# Patient Record
Sex: Female | Born: 1935 | Race: White | Hispanic: No | Marital: Married | Smoking: Never smoker
Health system: Southern US, Community
[De-identification: ages and names within clinical notes are randomized; demographics above are authoritative.]

## PROBLEM LIST (undated history)

## (undated) DIAGNOSIS — Z9289 Personal history of other medical treatment: Secondary | ICD-10-CM

## (undated) DIAGNOSIS — K5792 Diverticulitis of intestine, part unspecified, without perforation or abscess without bleeding: Secondary | ICD-10-CM

## (undated) DIAGNOSIS — K219 Gastro-esophageal reflux disease without esophagitis: Secondary | ICD-10-CM

## (undated) DIAGNOSIS — F0280 Dementia in other diseases classified elsewhere without behavioral disturbance: Secondary | ICD-10-CM

## (undated) DIAGNOSIS — R0609 Other forms of dyspnea: Secondary | ICD-10-CM

## (undated) DIAGNOSIS — I219 Acute myocardial infarction, unspecified: Secondary | ICD-10-CM

## (undated) DIAGNOSIS — R002 Palpitations: Secondary | ICD-10-CM

## (undated) DIAGNOSIS — D0472 Carcinoma in situ of skin of left lower limb, including hip: Secondary | ICD-10-CM

## (undated) DIAGNOSIS — F0150 Vascular dementia without behavioral disturbance: Secondary | ICD-10-CM

## (undated) DIAGNOSIS — I10 Essential (primary) hypertension: Secondary | ICD-10-CM

## (undated) DIAGNOSIS — T7840XA Allergy, unspecified, initial encounter: Secondary | ICD-10-CM

## (undated) DIAGNOSIS — R0600 Dyspnea, unspecified: Secondary | ICD-10-CM

## (undated) DIAGNOSIS — D414 Neoplasm of uncertain behavior of bladder: Secondary | ICD-10-CM

## (undated) DIAGNOSIS — Z889 Allergy status to unspecified drugs, medicaments and biological substances status: Secondary | ICD-10-CM

## (undated) DIAGNOSIS — M1990 Unspecified osteoarthritis, unspecified site: Secondary | ICD-10-CM

## (undated) DIAGNOSIS — N200 Calculus of kidney: Secondary | ICD-10-CM

## (undated) DIAGNOSIS — Z780 Asymptomatic menopausal state: Secondary | ICD-10-CM

## (undated) DIAGNOSIS — Z9889 Other specified postprocedural states: Secondary | ICD-10-CM

## (undated) DIAGNOSIS — Z90710 Acquired absence of both cervix and uterus: Secondary | ICD-10-CM

## (undated) DIAGNOSIS — D649 Anemia, unspecified: Secondary | ICD-10-CM

## (undated) DIAGNOSIS — C4491 Basal cell carcinoma of skin, unspecified: Secondary | ICD-10-CM

## (undated) HISTORY — DX: Essential (primary) hypertension: I10

## (undated) HISTORY — DX: Other specified postprocedural states: Z98.890

## (undated) HISTORY — DX: Gastro-esophageal reflux disease without esophagitis: K21.9

## (undated) HISTORY — PX: CARPAL TUNNEL RELEASE: SHX101

## (undated) HISTORY — DX: Personal history of other medical treatment: Z92.89

## (undated) HISTORY — DX: Neoplasm of uncertain behavior of bladder: D41.4

## (undated) HISTORY — PX: CATARACT EXTRACTION W/ INTRAOCULAR LENS  IMPLANT, BILATERAL: SHX1307

## (undated) HISTORY — DX: Dyspnea, unspecified: R06.00

## (undated) HISTORY — DX: Allergy, unspecified, initial encounter: T78.40XA

## (undated) HISTORY — PX: TONSILLECTOMY: SUR1361

## (undated) HISTORY — DX: Other forms of dyspnea: R06.09

## (undated) HISTORY — PX: BASAL CELL CARCINOMA EXCISION: SHX1214

## (undated) HISTORY — DX: Allergy status to unspecified drugs, medicaments and biological substances: Z88.9

## (undated) HISTORY — PX: BACK SURGERY: SHX140

## (undated) HISTORY — DX: Palpitations: R00.2

## (undated) HISTORY — PX: EYE SURGERY: SHX253

## (undated) HISTORY — DX: Diverticulitis of intestine, part unspecified, without perforation or abscess without bleeding: K57.92

## (undated) HISTORY — DX: Asymptomatic menopausal state: Z78.0

## (undated) HISTORY — DX: Acquired absence of both cervix and uterus: Z90.710

## (undated) HISTORY — PX: JOINT REPLACEMENT: SHX530

## (undated) HISTORY — PX: ABDOMINAL HYSTERECTOMY: SHX81

---

## 1994-11-28 HISTORY — PX: BREAST EXCISIONAL BIOPSY: SUR124

## 2003-11-29 HISTORY — PX: POSTERIOR CERVICAL LAMINECTOMY: SHX2248

## 2005-01-13 ENCOUNTER — Ambulatory Visit: Payer: Self-pay

## 2005-06-08 ENCOUNTER — Ambulatory Visit: Payer: Self-pay | Admitting: Internal Medicine

## 2005-12-01 ENCOUNTER — Ambulatory Visit: Payer: Self-pay | Admitting: Urology

## 2006-06-12 ENCOUNTER — Ambulatory Visit: Payer: Self-pay | Admitting: Internal Medicine

## 2007-06-14 ENCOUNTER — Ambulatory Visit: Payer: Self-pay | Admitting: Internal Medicine

## 2008-06-16 ENCOUNTER — Ambulatory Visit: Payer: Self-pay | Admitting: Internal Medicine

## 2009-05-27 ENCOUNTER — Encounter: Payer: Self-pay | Admitting: Cardiology

## 2009-05-27 ENCOUNTER — Ambulatory Visit: Payer: Self-pay | Admitting: Cardiology

## 2009-05-27 DIAGNOSIS — R002 Palpitations: Secondary | ICD-10-CM | POA: Insufficient documentation

## 2009-05-27 DIAGNOSIS — R9431 Abnormal electrocardiogram [ECG] [EKG]: Secondary | ICD-10-CM

## 2009-05-27 DIAGNOSIS — I10 Essential (primary) hypertension: Secondary | ICD-10-CM | POA: Insufficient documentation

## 2009-06-03 ENCOUNTER — Ambulatory Visit: Payer: Self-pay | Admitting: Cardiology

## 2009-06-04 ENCOUNTER — Encounter: Payer: Self-pay | Admitting: Cardiology

## 2009-06-11 ENCOUNTER — Telehealth (INDEPENDENT_AMBULATORY_CARE_PROVIDER_SITE_OTHER): Payer: Self-pay | Admitting: *Deleted

## 2009-06-15 ENCOUNTER — Encounter: Payer: Self-pay | Admitting: Cardiovascular Disease

## 2009-06-15 ENCOUNTER — Ambulatory Visit: Payer: Self-pay

## 2009-06-15 ENCOUNTER — Ambulatory Visit: Payer: Self-pay | Admitting: Cardiology

## 2009-06-17 ENCOUNTER — Ambulatory Visit: Payer: Self-pay | Admitting: Cardiology

## 2009-06-17 DIAGNOSIS — R0602 Shortness of breath: Secondary | ICD-10-CM | POA: Insufficient documentation

## 2009-06-19 LAB — CONVERTED CEMR LAB
BUN: 14 mg/dL (ref 6–23)
Chloride: 104 meq/L (ref 96–112)
Glucose, Bld: 93 mg/dL (ref 70–99)
Potassium: 3.9 meq/L (ref 3.5–5.1)

## 2009-06-22 ENCOUNTER — Ambulatory Visit: Payer: Self-pay

## 2009-06-22 ENCOUNTER — Encounter: Payer: Self-pay | Admitting: Cardiology

## 2009-08-13 ENCOUNTER — Ambulatory Visit: Payer: Self-pay | Admitting: Internal Medicine

## 2010-01-13 ENCOUNTER — Emergency Department: Payer: Self-pay | Admitting: Emergency Medicine

## 2010-01-18 ENCOUNTER — Ambulatory Visit: Payer: Self-pay | Admitting: Urology

## 2010-09-29 ENCOUNTER — Ambulatory Visit: Payer: Self-pay | Admitting: Internal Medicine

## 2011-05-19 ENCOUNTER — Encounter: Payer: Self-pay | Admitting: Cardiovascular Disease

## 2011-10-19 ENCOUNTER — Ambulatory Visit: Payer: Self-pay | Admitting: Family Medicine

## 2011-11-27 ENCOUNTER — Emergency Department: Payer: Self-pay | Admitting: Emergency Medicine

## 2012-02-08 ENCOUNTER — Ambulatory Visit: Payer: Self-pay | Admitting: Family Medicine

## 2012-10-12 ENCOUNTER — Ambulatory Visit: Payer: Self-pay

## 2012-11-13 ENCOUNTER — Ambulatory Visit: Payer: Self-pay | Admitting: Family Medicine

## 2013-02-02 ENCOUNTER — Ambulatory Visit: Payer: Self-pay | Admitting: Orthopedic Surgery

## 2013-02-02 ENCOUNTER — Inpatient Hospital Stay: Payer: Self-pay | Admitting: Orthopedic Surgery

## 2013-02-02 DIAGNOSIS — Z01810 Encounter for preprocedural cardiovascular examination: Secondary | ICD-10-CM

## 2013-02-02 LAB — BASIC METABOLIC PANEL
BUN: 19 mg/dL — ABNORMAL HIGH (ref 7–18)
Co2: 29 mmol/L (ref 21–32)
EGFR (African American): 54 — ABNORMAL LOW
EGFR (Non-African Amer.): 47 — ABNORMAL LOW
Glucose: 121 mg/dL — ABNORMAL HIGH (ref 65–99)
Osmolality: 285 (ref 275–301)
Potassium: 3.4 mmol/L — ABNORMAL LOW (ref 3.5–5.1)
Sodium: 141 mmol/L (ref 136–145)

## 2013-02-02 LAB — APTT: Activated PTT: 31.7 secs (ref 23.6–35.9)

## 2013-02-02 LAB — PROTIME-INR: INR: 0.9

## 2013-02-02 LAB — CBC
HGB: 14.2 g/dL (ref 12.0–16.0)
MCV: 91 fL (ref 80–100)
Platelet: 216 10*3/uL (ref 150–440)
RBC: 4.69 10*6/uL (ref 3.80–5.20)
RDW: 14.3 % (ref 11.5–14.5)
WBC: 11.7 10*3/uL — ABNORMAL HIGH (ref 3.6–11.0)

## 2013-02-05 LAB — CBC WITH DIFFERENTIAL/PLATELET
Basophil #: 0 10*3/uL (ref 0.0–0.1)
Basophil %: 0.3 %
Eosinophil #: 0.2 10*3/uL (ref 0.0–0.7)
Eosinophil %: 2 %
HGB: 10.5 g/dL — ABNORMAL LOW (ref 12.0–16.0)
Lymphocyte %: 15.6 %
MCH: 30.3 pg (ref 26.0–34.0)
MCHC: 33.2 g/dL (ref 32.0–36.0)
MCV: 91 fL (ref 80–100)
Monocyte %: 10.9 %
Neutrophil #: 7.5 10*3/uL — ABNORMAL HIGH (ref 1.4–6.5)
Neutrophil %: 71.2 %
Platelet: 146 10*3/uL — ABNORMAL LOW (ref 150–440)
RBC: 3.47 10*6/uL — ABNORMAL LOW (ref 3.80–5.20)

## 2013-02-05 LAB — BASIC METABOLIC PANEL
Anion Gap: 3 — ABNORMAL LOW (ref 7–16)
Calcium, Total: 7.6 mg/dL — ABNORMAL LOW (ref 8.5–10.1)
Chloride: 105 mmol/L (ref 98–107)
Co2: 30 mmol/L (ref 21–32)
EGFR (Non-African Amer.): 58 — ABNORMAL LOW
Glucose: 94 mg/dL (ref 65–99)
Osmolality: 274 (ref 275–301)
Potassium: 3.5 mmol/L (ref 3.5–5.1)
Sodium: 138 mmol/L (ref 136–145)

## 2013-02-07 ENCOUNTER — Encounter: Payer: Self-pay | Admitting: Internal Medicine

## 2013-02-26 ENCOUNTER — Encounter: Payer: Self-pay | Admitting: Internal Medicine

## 2013-04-18 IMAGING — MG MM CAD SCREENING MAMMO
1 series · 4 of 4 positions shown · non-contrast
Comparison: none

REASON FOR EXAM: SCR MAMMO NO ORDER
COMMENTS:

[R CC · right · 4 of 4 slices shown]
[im 1/4]
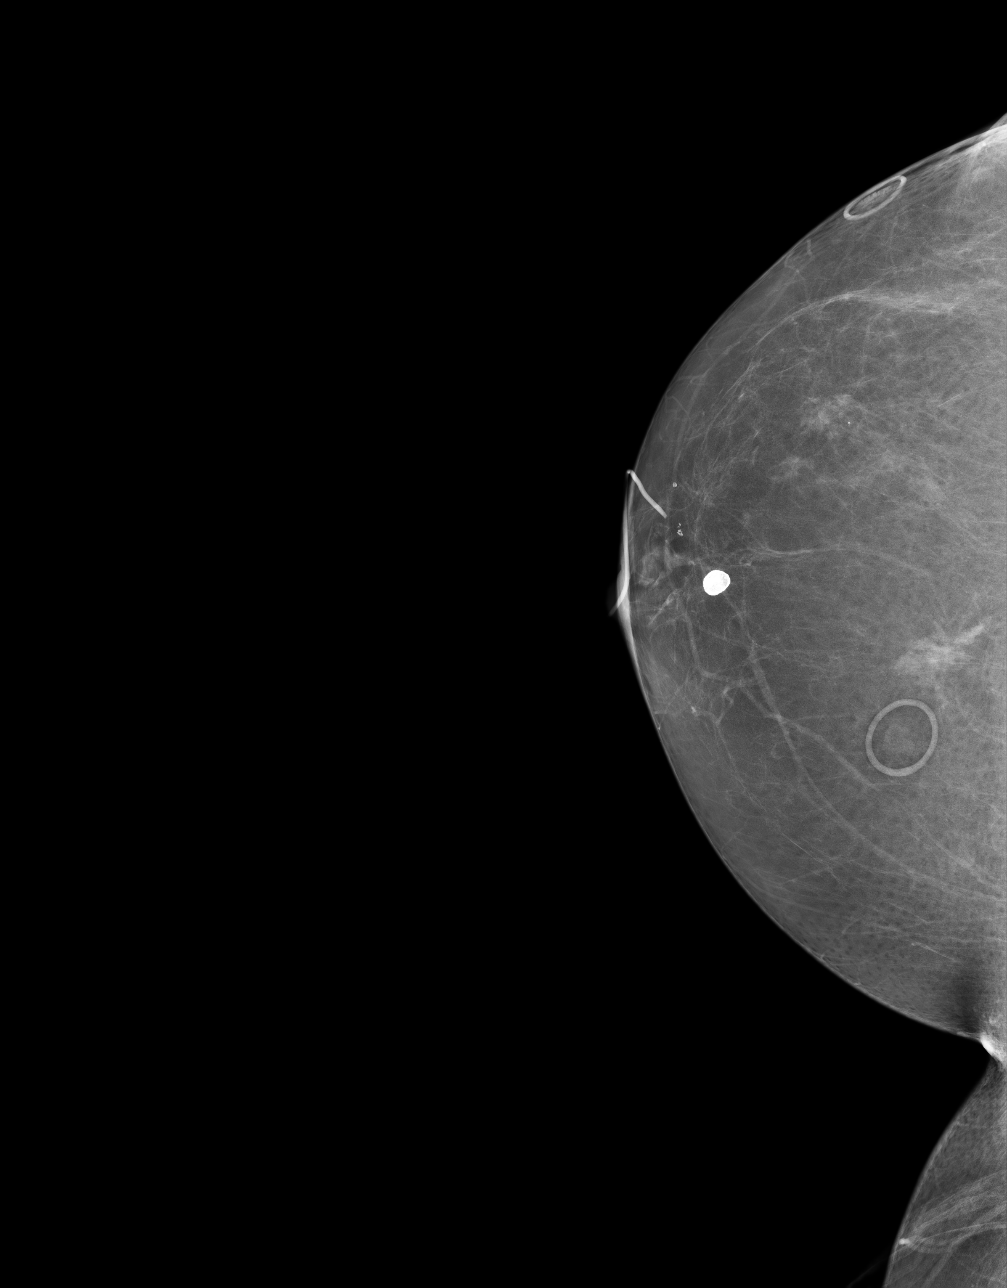
[im 2/4]
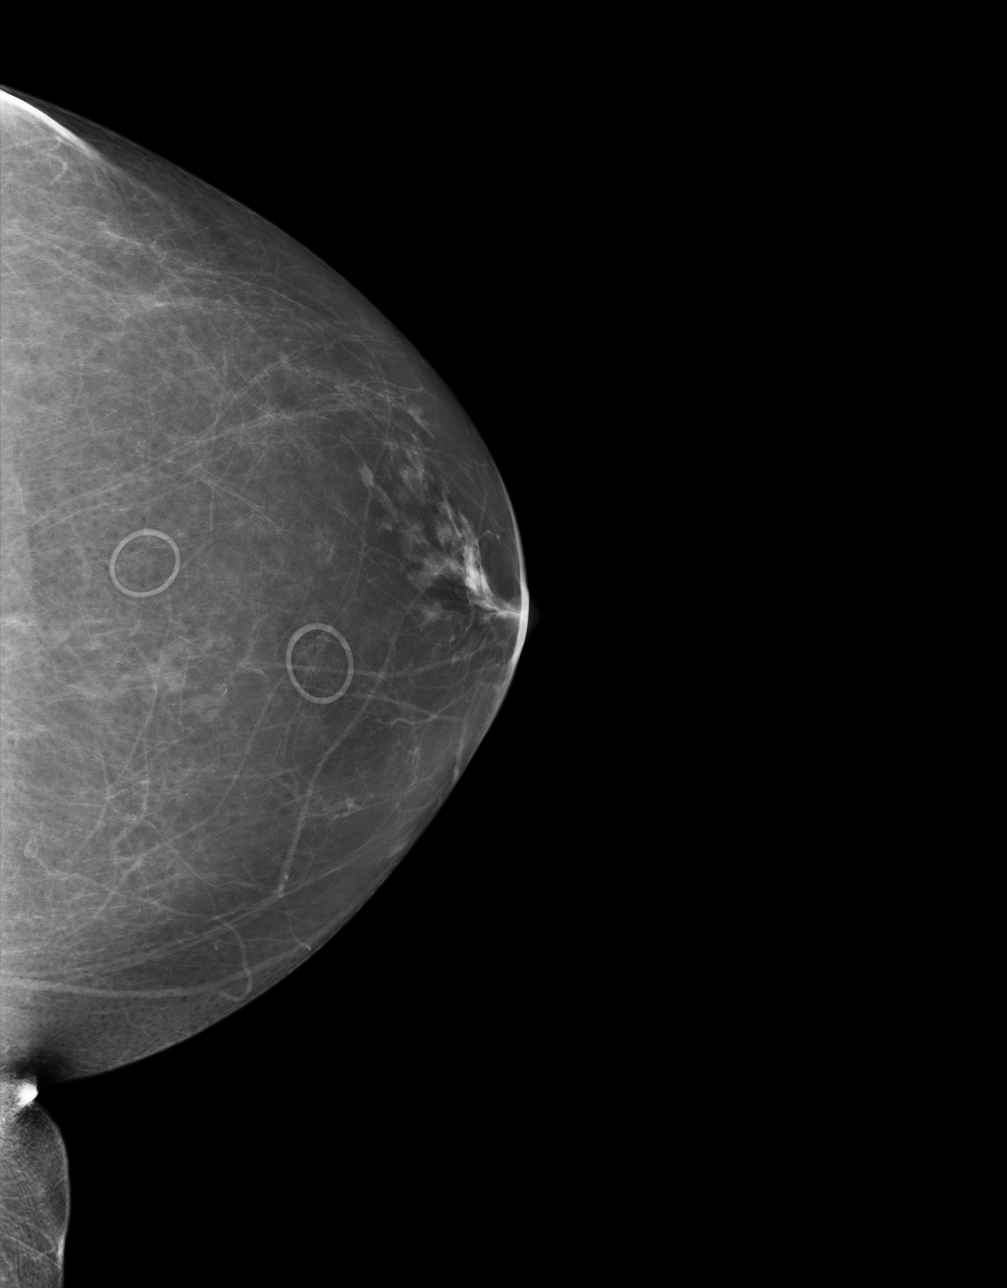
[im 3/4]
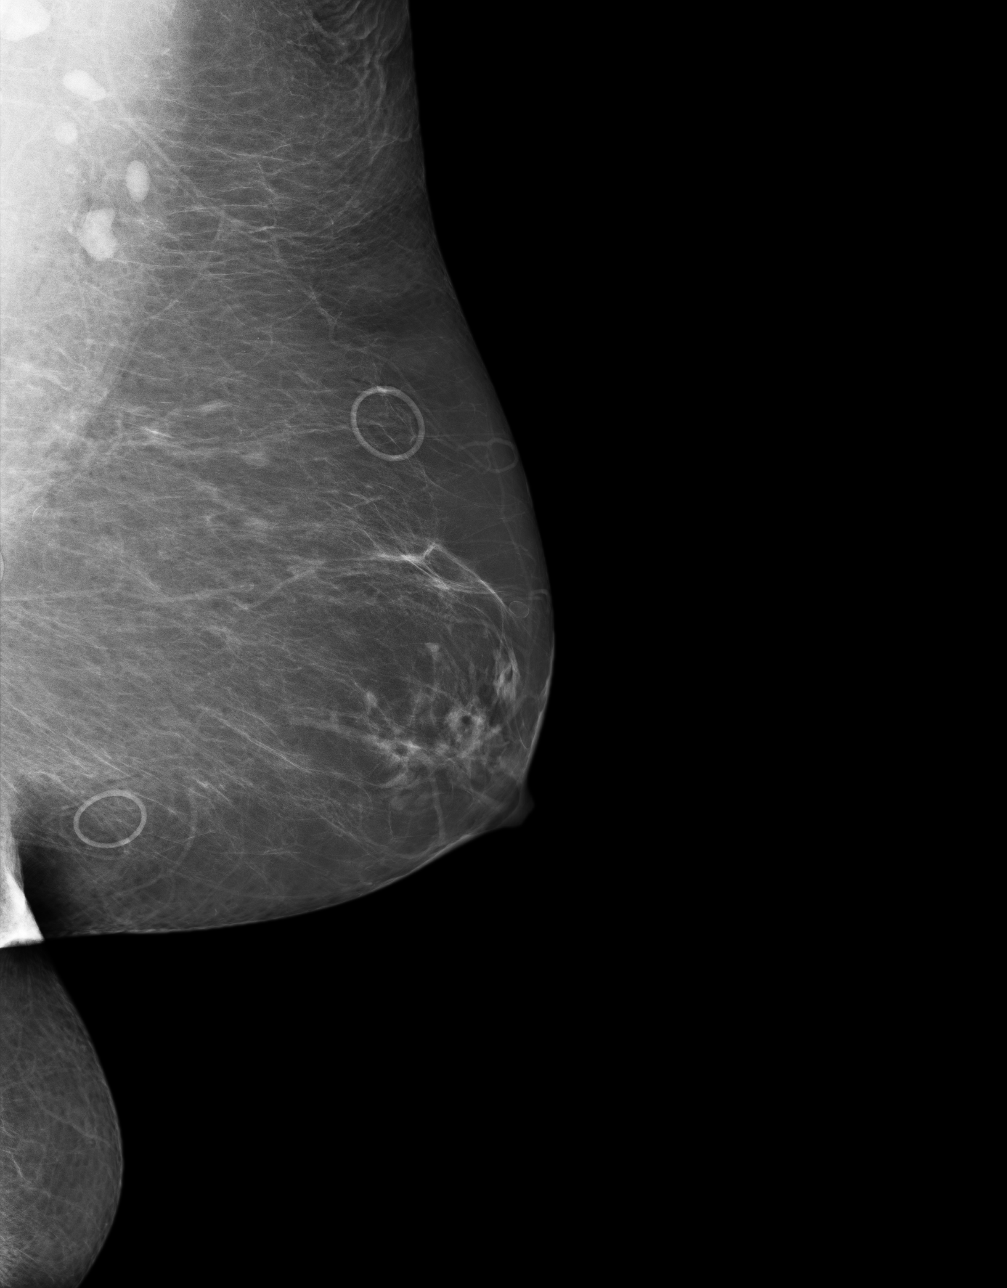
[im 4/4]
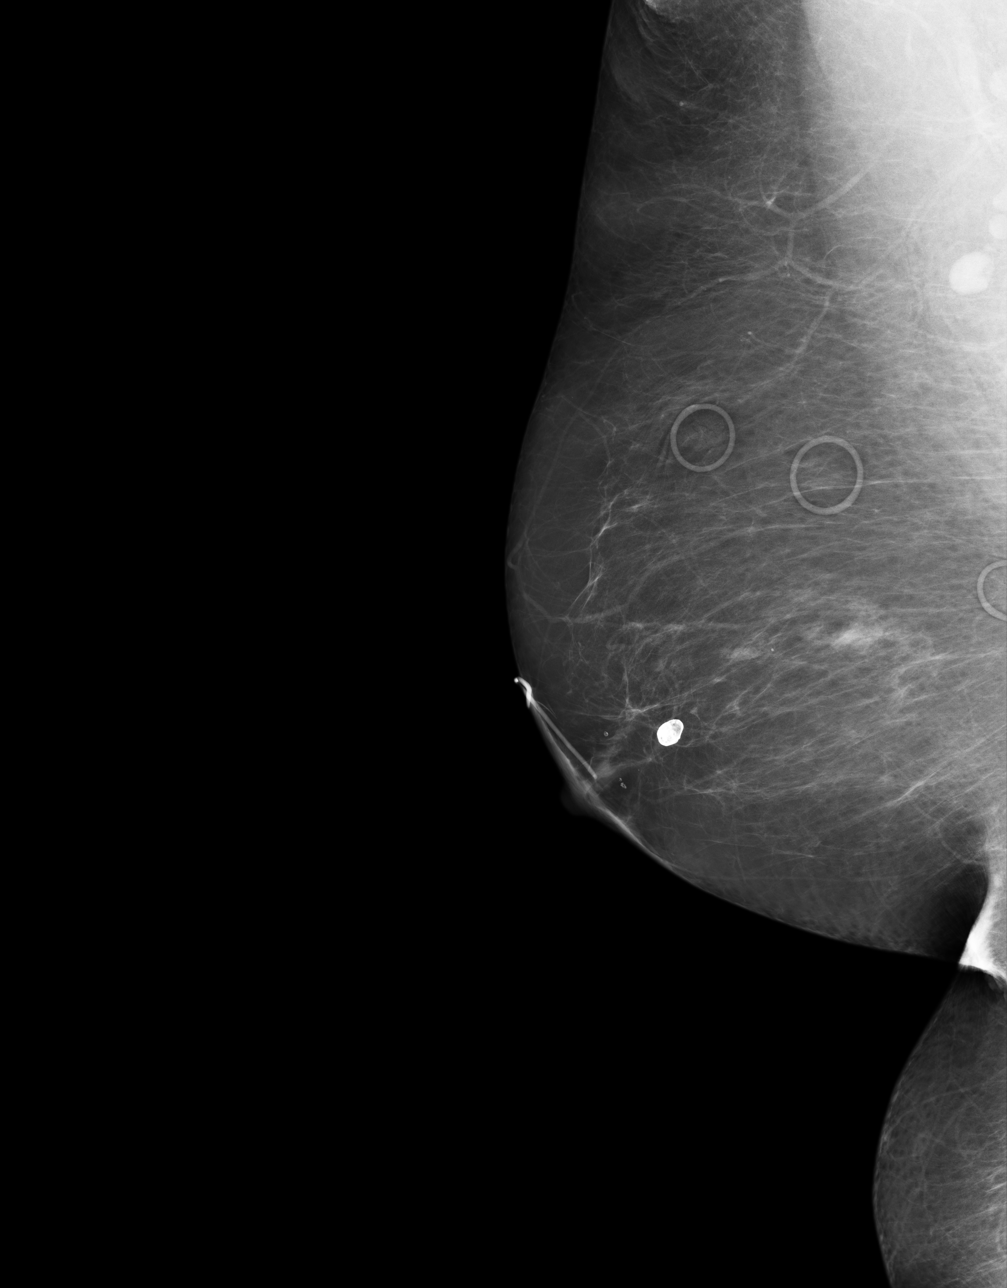

[4 of 4 positions shown; findings below may reference images not displayed]

PROCEDURE:     MAM - MAM DGTL SCRN MAM NO ORDER W/CAD  - November 13, 2012  [DATE]

RESULT:     There is a family history of breast cancer in two of the
patient's maternal aunts. The patient has undergone previous negative right
breast biopsy in 4665. Comparison is made to previous digital mammographic
studies including images 19 October, 2011,29 September, 2010 and 10 March, 2003. There is predominant fatty replacement with some minimal residual
parenchymal elements. Course, benign appearing calcification is present in
the anterior right breast. Benign anterior parenchymal calcifications are
seen bilaterally. There is no developing parenchymal density or dominant
mass. The appearance is stable. Axillary lymph nodes are unchanged. Skin
nevus markers are present bilaterally.
IMPRESSION: 1. Stable, benign appearing bilateral mammogram. Please continue to
encourage annual mammographic follow-up and monthly breast self exam.

A NEGATIVE MAMMOGRAM REPORT DOES NOT PRECLUDE BIOPSY OR OTHER EVALUATION OF
A CLINICALLY PALPABLE OR OTHERWISE SUSPICIOUS MASS OR LESION. BREAST CANCER
MAY NOT BE DETECTED BY MAMMOGRAPHY IN UP TO 10% OF CASES.

Breast density: BI-RADS type 1

BI-RADS: Category 1 - Negative

[REDACTED]

## 2013-07-29 HISTORY — PX: ANKLE FRACTURE SURGERY: SHX122

## 2013-08-07 ENCOUNTER — Ambulatory Visit: Payer: Self-pay | Admitting: Orthopedic Surgery

## 2013-08-07 LAB — POTASSIUM: Potassium: 4 mmol/L (ref 3.5–5.1)

## 2013-08-14 ENCOUNTER — Ambulatory Visit: Payer: Self-pay | Admitting: Orthopedic Surgery

## 2013-09-08 LAB — CBC WITH DIFFERENTIAL/PLATELET
Basophil #: 0 10*3/uL (ref 0.0–0.1)
HCT: 41.4 % (ref 35.0–47.0)
HGB: 13.9 g/dL (ref 12.0–16.0)
Lymphocyte #: 3.2 10*3/uL (ref 1.0–3.6)
Lymphocyte %: 27.3 %
MCH: 30.3 pg (ref 26.0–34.0)
MCHC: 33.6 g/dL (ref 32.0–36.0)
Monocyte #: 0.9 x10 3/mm (ref 0.2–0.9)
Monocyte %: 8 %
Neutrophil #: 7.4 10*3/uL — ABNORMAL HIGH (ref 1.4–6.5)
Neutrophil %: 62.6 %
Platelet: 217 10*3/uL (ref 150–440)
RBC: 4.6 10*6/uL (ref 3.80–5.20)
RDW: 14.1 % (ref 11.5–14.5)
WBC: 11.7 10*3/uL — ABNORMAL HIGH (ref 3.6–11.0)

## 2013-09-08 LAB — COMPREHENSIVE METABOLIC PANEL
Albumin: 3.5 g/dL (ref 3.4–5.0)
Bilirubin,Total: 0.3 mg/dL (ref 0.2–1.0)
Chloride: 107 mmol/L (ref 98–107)
EGFR (African American): 55 — ABNORMAL LOW
EGFR (Non-African Amer.): 47 — ABNORMAL LOW
Glucose: 114 mg/dL — ABNORMAL HIGH (ref 65–99)
Potassium: 3.9 mmol/L (ref 3.5–5.1)
Total Protein: 7.3 g/dL (ref 6.4–8.2)

## 2013-09-08 LAB — URINALYSIS, COMPLETE
Bilirubin,UR: NEGATIVE
Ketone: NEGATIVE
Leukocyte Esterase: NEGATIVE
Nitrite: NEGATIVE
Ph: 6 (ref 4.5–8.0)
RBC,UR: 1 /HPF (ref 0–5)
Specific Gravity: 1.021 (ref 1.003–1.030)
Squamous Epithelial: 1
WBC UR: 1 /HPF (ref 0–5)

## 2013-09-08 LAB — TROPONIN I: Troponin-I: <0.02 ng/mL

## 2013-09-08 LAB — CK TOTAL AND CKMB (NOT AT ARMC): CK, Total: 101 U/L (ref 21–215)

## 2013-09-09 LAB — CBC WITH DIFFERENTIAL/PLATELET
Basophil #: 0 10*3/uL (ref 0.0–0.1)
Basophil %: 0.4 %
HCT: 38.9 % (ref 35.0–47.0)
Lymphocyte #: 3.1 10*3/uL (ref 1.0–3.6)
Lymphocyte %: 28 %
MCHC: 34.1 g/dL (ref 32.0–36.0)
MCV: 91 fL (ref 80–100)
Neutrophil %: 61.6 %

## 2013-09-09 LAB — BASIC METABOLIC PANEL
Anion Gap: 9 (ref 7–16)
Calcium, Total: 8.7 mg/dL (ref 8.5–10.1)
Chloride: 106 mmol/L (ref 98–107)
Creatinine: 1.12 mg/dL (ref 0.60–1.30)
EGFR (Non-African Amer.): 48 — ABNORMAL LOW
Glucose: 103 mg/dL — ABNORMAL HIGH (ref 65–99)
Osmolality: 283 (ref 275–301)
Potassium: 3.6 mmol/L (ref 3.5–5.1)
Sodium: 141 mmol/L (ref 136–145)

## 2013-09-09 LAB — PROTIME-INR: Prothrombin Time: 12.9 secs (ref 11.5–14.7)

## 2013-09-10 ENCOUNTER — Inpatient Hospital Stay: Payer: Self-pay | Admitting: Family Medicine

## 2013-09-10 LAB — BASIC METABOLIC PANEL
Anion Gap: 6 — ABNORMAL LOW (ref 7–16)
BUN: 19 mg/dL — ABNORMAL HIGH (ref 7–18)
Calcium, Total: 8.7 mg/dL (ref 8.5–10.1)
Chloride: 107 mmol/L (ref 98–107)
Co2: 26 mmol/L (ref 21–32)
Creatinine: 0.99 mg/dL (ref 0.60–1.30)
EGFR (African American): >60
Glucose: 146 mg/dL — ABNORMAL HIGH (ref 65–99)
Osmolality: 282 (ref 275–301)
Potassium: 3.9 mmol/L (ref 3.5–5.1)
Sodium: 139 mmol/L (ref 136–145)

## 2013-09-10 LAB — HEMOGLOBIN: HGB: 12.4 g/dL (ref 12.0–16.0)

## 2013-09-11 LAB — BASIC METABOLIC PANEL
Anion Gap: 6 — ABNORMAL LOW (ref 7–16)
Calcium, Total: 8.9 mg/dL (ref 8.5–10.1)
Co2: 26 mmol/L (ref 21–32)
EGFR (African American): 55 — ABNORMAL LOW
EGFR (Non-African Amer.): 47 — ABNORMAL LOW
Potassium: 3.7 mmol/L (ref 3.5–5.1)
Sodium: 140 mmol/L (ref 136–145)

## 2013-09-11 LAB — HEMOGLOBIN: HGB: 12.2 g/dL (ref 12.0–16.0)

## 2013-09-12 LAB — HEMOGLOBIN: HGB: 12.2 g/dL (ref 12.0–16.0)

## 2013-09-12 LAB — BRONCHIAL WASH CULTURE

## 2013-09-13 LAB — CBC WITH DIFFERENTIAL/PLATELET
Basophil #: 0 10*3/uL (ref 0.0–0.1)
Basophil %: 0 %
Eosinophil #: 0 10*3/uL (ref 0.0–0.7)
Eosinophil %: 0 %
HGB: 10.9 g/dL — ABNORMAL LOW (ref 12.0–16.0)
Lymphocyte #: 2.6 10*3/uL (ref 1.0–3.6)
Lymphocyte %: 18 %
MCV: 90 fL (ref 80–100)
Monocyte %: 7.9 %
Neutrophil #: 10.6 10*3/uL — ABNORMAL HIGH (ref 1.4–6.5)
Neutrophil %: 74.1 %
Platelet: 158 10*3/uL (ref 150–440)
RBC: 3.63 10*6/uL — ABNORMAL LOW (ref 3.80–5.20)
RDW: 14.1 % (ref 11.5–14.5)
WBC: 14.4 10*3/uL — ABNORMAL HIGH (ref 3.6–11.0)

## 2013-09-30 LAB — CULTURE, FUNGUS WITHOUT SMEAR

## 2013-12-03 ENCOUNTER — Ambulatory Visit: Payer: Self-pay | Admitting: Specialist

## 2013-12-31 ENCOUNTER — Emergency Department: Payer: Self-pay | Admitting: Emergency Medicine

## 2014-02-03 ENCOUNTER — Ambulatory Visit: Payer: Self-pay | Admitting: Family Medicine

## 2014-05-26 ENCOUNTER — Ambulatory Visit: Payer: Self-pay | Admitting: Otolaryngology

## 2014-06-05 ENCOUNTER — Ambulatory Visit: Payer: Self-pay | Admitting: Otolaryngology

## 2014-06-05 HISTORY — PX: NASAL SINUS SURGERY: SHX719

## 2014-06-06 LAB — PATHOLOGY REPORT

## 2015-03-20 NOTE — Discharge Summary (Signed)
PATIENT NAME:  Julie Mcbride, Julie Mcbride MR#:  [id number] DATE OF BIRTH:  [birth year]  DATE OF ADMISSION:  09/10/2013 DATE OF DISCHARGE: 09/13/2013   DISCHARGE DIAGNOSES:  1.  Hemoptysis.  2.  Hypertension.  3.  Anemia.   DISCHARGE MEDICATIONS:  1.  Prednisone taper as directed.  2.  Calcium/vitamin D as directed.  3.  Hydralazine 25 mg p.o. t.i.d.  4.  Lisinopril 40 mg p.o. daily.  5.  Bisoprolol/hydrochlorothiazide 10/6.25 mg 1 tabs p.o. daily.  6.  Multivitamin as directed.  7.  Vicodin 325/5 mg 1 to  2 tabs every 6 hours as needed for pain. 8.  Cyclobenzaprine 5 mg p.o. t.i.d. p.r.n. for muscle spasms.  9.  Augmentin 875/125 mg 1 tab p.o. b.i.d. x 7 more days.   CONSULTS: Pulmonology.   PROCEDURE: Had a bronchoscopy performed.   PERTINENT LABS ON DAY OF DISCHARGE: White blood cell count 14.4, hemoglobin 10.9 and platelets 158.   BRIEF HOSPITAL COURSE:  1.  Hemoptysis. The patient was initially admitted for acute hemoptysis with unclear etiology. No fevers associated. The patient was consulted by pulmonology, who underwent a bronchoscopy and did show some bloody sputum and contents within the lung, but no mass was seen and no active low bleeding vessel was seen. It was washed out. Cultures were obtained. Her clinical status improved. She had less and less hemoptysis each day. She was placed on steroids and antibiotics for coverage. Her TB screening came back negative. Her rheumatological workup with ANCA and ANA studies were also negative. Cultures were negative for acute infection as well. She had rare gram-positives and neutrophils present. Plan is to continue coverage with steroid taper as this is improving and also with Augmentin as an outpatient. Will likely need to follow up with pulmonology as an outpatient pending her clinical status. At this point, it is unclear of the etiology of her hemoptysis. She is stable and her hemoglobin is stable to go home.  2.  Hypertension. It remained  stable during her hospital course. Continue her home blood pressure regimen. No changes there.   DISPOSITION: She is in stable condition and will be discharged to home.   FOLLOWUP: Will followup with Dr. Netty Starring next week and will decide whether or not to follow up with pulmonology as an outpatient.  ____________________________ Dion Body, MD kl:aw D: 09/13/2013 08:03:56 ET T: 09/13/2013 08:48:01 ET JOB#: [id number]  cc: Dion Body, MD, <Dictator> Dion Body MD ELECTRONICALLY SIGNED 10/08/2013 16:53

## 2015-03-20 NOTE — Consult Note (Signed)
Brief Consult Note: Comments: Attempted to see pt for GI consult. Pt not in room, staff notes pt down for bronchoscopy. Will evaluate later Notified by Dr Allen Norris @ 1740 GI consult cancelled..  Electronic Signatures: Andria Meuse (NP)  (Signed 13-Oct-14 17:50)  Authored: Brief Consult Note   Last Updated: 13-Oct-14 17:50 by Andria Meuse (NP)

## 2015-03-20 NOTE — Discharge Summary (Signed)
PATIENT NAME:  Julie Mcbride, Julie Mcbride MR#:  [id number] DATE OF BIRTH:  October 29, 1936  DATE OF ADMISSION:  02/02/2013 DATE OF DISCHARGE: 02/06/2013   ADMITTING DIAGNOSIS: Right ankle fracture-dislocation.   DISCHARGE DIAGNOSIS: Right ankle fracture-dislocation.   PROCEDURE PERFORMED: Open reduction internal fixation, right ankle fracture.   SURGEON OF RECORD: Dr. Stacy Gardner.   ANESTHESIA: General.   TOURNIQUET TIME: 50 minutes.   PREOPERATIVE ANTIBIOTICS: Ancef 1 gram.   COMPLICATIONS: None.   DISPOSITION: Stable to PACU upon transport.   HISTORY: The patient is a 79 year old female, who slipped on ice and sustained a right ankle fracture-dislocation.   PHYSICAL EXAMINATION:  GENERAL: Well developed, well nourished female in no acute distress.  NECK: Supple. RESPIRATIONS: Normal respiratory effort.  CARDIOLOGY: Regular rate.  ABDOMEN: No tenderness.  ADDITIONAL COMMENTS: posterior splint placed right lower extremity. Able to wiggle all toes. She is neurovascularly intact.   HOSPITAL COURSE: The patient was admitted to the hospital on 02/02/2013 through the ER. She was cleared for surgery that day and on 02/03/2013, she underwent surgery for ORIF of right ankle fracture-dislocation. The patient did well and was sent to the orthopedic floor from the PACU unit in stable condition. The patient's pain was controlled. Vital signs remained stable as well as her blood pressure. 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 The patient was unable to progress with physical therapy to the point where she was safe enough to go home. The patient did except the offer from [facility] where she will go to receive physical therapy to help with ambulation and gait. On 02/06/2013, the patient was stable and ready for discharge to [facility] for physical therapy.   DISCHARGE INSTRUCTIONS: The patient may not put any weight on the affected extremity. She  needs to elevate the affected foot or leg on 1 or 2 pillows with the foot higher than the knee. She may resume a regular diet as tolerated. She is to apply an ice pack to the affected area and leave the splint on. Keep splint dry. Call [facility] ortho if any of the following occur: Bright red bleeding from the incision wound, fever above 101.5 degrees, redness, swelling or drainage at the incision. Call [facility] ortho if you experience any increased leg pain, numbness or weakness in your legs or bowel or bladder symptoms. She is referred to physical therapy. She has a followup appointment in 10 to 12 days for staple removal and cast application.   DISCHARGE MEDICATIONS: Alendronate 70 mg oral tablet 1 tablet orally once a week, lisinopril/hydrochlorothiazide 10 mg 6.25 mg oral tablet 1 tablet orally once a day, lisinopril 40 mg oral tablet 1 tablet orally once a day, vitamin D, hydralazine 25 mg oral tablet orally 3 times a day, tramadol 50 mg oral tablet orally every 6 hours as needed for pain, baclofen 10 mg oral tablet 1 tablet orally 3 times a day as needed, Ibu 800 mg oral tablet 1 tablet orally every 6 hours as needed, Tylenol 500 mg oral tablet 1 tablet orally every 4 hours for pain or temperature greater then 100.4, Vicodin 5/325 mg 1 tablet orally every 4 hours as needed for pain, magnesium hydroxide 8% oral suspension 30 mL orally 2 times a day as needed for constipation, metoprolol tartrate 25 mg oral tablet 1 tab orally every 6 hours, ranitidine 150 mg oral tablet 1 tablet orally once a day at bedtime, Bisacodyl 10 mg rectal suppository one suppository rectal once  a day as needed, and aspirin 325 mg oral tablet 1 tablet orally once a day for 14 days.    ____________________________ Duanne Guess, PA-C tcg:aw D: 02/06/2013 07:46:46 ET T: 02/06/2013 08:01:21 ET JOB#: [id number]  cc: Duanne Guess, PA-C, <Dictator> Duanne Guess [place] ELECTRONICALLY SIGNED 02/27/2013 12:38

## 2015-03-20 NOTE — H&P (Signed)
   Subjective/Chief Complaint right nakle pain   History of Present Illness 79 y/o female who slipped on the ice and sustained a right ankle fracture dislocation   Past Medical Health Cancer   Past Med/Surgical Hx:  Osteoporosis:   HTN:   kidney infections:   char:   Hysterectomy - Total:   Neck Surgery:   ALLERGIES:  Sulfa drugs: Rash  Ibuprofen: Rash  Latex: Rash  HOME MEDICATIONS: Medication Instructions Status  alendronate 70 mg oral tablet 1 tab(s) orally once a week Active  bisoprolol-hydrochlorothiazide 10 mg-6.25 mg oral tablet 1 tab(s) orally once a day Active  lisinopril 40 mg oral tablet 1 tab(s) orally once a day Active   Family and Social History:  Family History Non-Contributory   Social History negative tobacco   Place of Living Home   Review of Systems:  Subjective/Chief Complaint right ankle pain   Fever/Chills No   Cough No   Sputum No   Abdominal Pain No   Diarrhea No   Constipation No   Nausea/Vomiting No   SOB/DOE No   Chest Pain No   Physical Exam:  GEN well developed, well nourished, no acute distress   NECK supple   RESP normal resp effort   CARD regular rate   ABD denies tenderness   Additional Comments Splint in place to RLE, able to wiggle all toes, SILT, brisk capillary refill to all digits    Assessment/Admission Diagnosis Right ankle fracture dislocation   Plan ORIF   Electronic Signatures: Claud Kelp (MD)  (Signed 08-Mar-14 12:15)  Authored: CHIEF COMPLAINT and HISTORY, PAST MEDICAL/SURGIAL HISTORY, ALLERGIES, HOME MEDICATIONS, FAMILY AND SOCIAL HISTORY, REVIEW OF SYSTEMS, PHYSICAL EXAM, ASSESSMENT AND PLAN   Last Updated: 08-Mar-14 12:15 by Claud Kelp (MD)

## 2015-03-20 NOTE — Op Note (Signed)
PATIENT NAME:  Julie Mcbride, Julie Mcbride MR#:  [id number] DATE OF BIRTH:  Jan 28, 1936  DATE OF PROCEDURE:  02/03/2013  PREOPERATIVE DIAGNOSIS: Right ankle fracture-dislocation.   POSTOPERATIVE DIAGNOSIS: Right ankle fracture-dislocation.   PROCEDURE PERFORMED: Open reduction, internal fixation of right ankle fracture.   SURGEON OF RECORD: Stacy Gardner.   ANESTHESIA: General.   TOTAL TOURNIQUET TIME: 52 minutes.   PREOPERATIVE ANTIBIOTICS: Ancef 1 gram.   COMPLICATIONS: None.   DISPOSITION: Stable to PACU upon transport.   IV FLUIDS: Crystalloid, 700.   INDICATIONS FOR PROCEDURE: This is a 79 year old female status post trip and fall yesterday, sustaining an ankle fracture-dislocation. She and her husband were counseled regarding the risks and benefits of procedure, the risks to include, but not limited to, bleeding, infection, damage to nerves and/or vessels, need for additional surgical procedures. She was also counseled regarding the risks, signs, and symptoms of a DVT. She agreed to proceed with surgery after weighing the risks.   DESCRIPTION OF PROCEDURE: The patient was brought to the operating room, placed on the operating table with all extremities appropriately padded. A thigh tourniquet was applied and the right lower extremity was cleansed with an alcohol solution. The right lower extremity was next prepped and draped in a sterile fashion using DuraPrep. This time, we performed a time-out between Anesthesia, nursing staff, and surgeon of record to confirm that the patient, medical record number, laterality, procedure to be performed, date of birth, as well as administration of preoperative antibiotics. Next, the right lower extremity was exsanguinated with a 6-inch Esmarch and the tourniquet was inflated to 250 mmHg. A lateral incision was made overlying the distal fibula. The superficial peroneal nerve was identified and retracted anteriorly and protected. The fracture site was curetted and  rinsed out with sterile saline. She had a distal fibular fracture running predominantly anterior-superior to posterior-inferior, with a subsequent sagittal split off of the lateral cortex. Reduction clamps were used to reduce the distal fibular fracture and this was stabilized with two 2.71mm lag screws. At this point, given the tenuous nature of the quality of her bone and the distal extent of the fibular fracture, we decided to fix with a distal fibular locking plate. Three distal locking screws were placed and three cortical nonlocking screws were placed proximally in the plate. Reduction and hardware placement was confirmed fluoroscopically. Next, we made incision overlying the anterior aspect of the medial malleolus where she had a small medial malleolar fracture at the anterior aspect. This fracture was also cleaned out and a reduction clamp was used to reduce the fracture. Two K-wires were placed for the 4.0 cannulated screws; one was used for de-rotation, while a single 4-0 screw was placed to stabilize the medial malleolar fracture. Both wires were then removed. Final x-rays were again taken, confirming reduction, and wounds were irrigated with sterile saline and closed sequentially with 2-0 Vicryl deep, followed by 3-0 Monocryl and a running 3-0 nylon. A sterile dressing was applied; a well-padded LMU splint. The patient was transferred to the PACU in stable condition.    ____________________________ Claud Kelp, MD tte:dm D: 02/03/2013 12:49:00 ET T: 02/03/2013 13:42:36 ET JOB#: [id number]  cc: Claud Kelp, MD, <Dictator> Claud Kelp MD ELECTRONICALLY SIGNED 02/03/2013 21:07

## 2015-03-20 NOTE — H&P (Signed)
PATIENT NAME:  Julie Mcbride, Julie Mcbride MR#:  [id number] DATE OF BIRTH:  02-04-1936  DATE OF ADMISSION:  09/08/2013  .  PRIMARY CARE PHYSICIAN: Dr. Netty Starring.   CHIEF COMPLAINT: Hemoptysis.   HISTORY OF PRESENT ILLNESS: This is a 79 year old female with significant past medical history of hypertension, osteoporosis, chronic neck pain secondary to cervical spinal disease. The patient presented to the ED today with complaint of hemoptysis. The patient reports she has been having significant hemoptysis over the last two nights. Tonight was so significant where she had to come to the ED. The patient had recent ankle surgery in September last month, where she has some hardware removed from her ankle, but she has been ambulatory since. The patient has elevated d-dimer, so she had a CT of the chest with IV contrast to evaluate for PE. Her CT chest came back negative for PE, with no acute findings. Given the fact she had significant hemoptysis hospitalist service was requested to admit the patient for further evaluation. The patient denies any chest pain, any shortness of breath, any coffee-ground emesis, any melena. She had a history of epistaxis in the remote past but nothing recent. The patient's hemoglobin came back stable at 13.9.   PAST MEDICAL HISTORY: 1. Osteoporosis.  2. Hypertension.  3. C2,  C3 fracture in December 2012 secondary to fall treated surgically.   PAST SURGICAL HISTORY:  1. Laminectomy at C5 - C7.  2. Hysterectomy.  3. Benign breast biopsy.  4. Recent right ankle surgery with removal of hardware.   ALLERGIES: OXYCODONE MAKES HER NERVOUS. SULFA CAUSES HER TO HAVE HIVES.   FAMILY HISTORY: Significant for ovarian cancer in her mother and CVA in father.   SOCIAL HISTORY: The patient has no history of smoking or alcohol abuse.   HOME MEDICATIONS: 1. Acetaminophen/hydrocodone 325/5, 1 to 2 tablets every six hours as needed for pain.  2. Lisinopril 40 mg oral daily.  3.  Bisoprolol/hydrochlorothiazide 10/6.25, 1 tablet oral daily.  4. Hydralazine 25 mg oral 3 times a day.  5. Calcium 600 with vitamin D 1 tablet daily.  6. Multivitamin 1 tablet oral daily.   REVIEW OF SYSTEMS:  CONSTITUTIONAL: The patient denies fever, chills, fatigue, weakness, weight gain, weight loss.  EYES: Denies blurry vision, double vision, inflammation or glaucoma.  ENT: Denies tinnitus, ear pain, hearing loss, epistaxis or discharge.  RESPIRATORY: Denies cough, wheezing, dyspnea   or chronic obstructive pulmonary disease. Complains of hemoptysis.  CARDIOVASCULAR: Denies chest pain, orthopnea, edema, palpitations, syncope.  GASTROINTESTINAL: Denies nausea, vomiting, diarrhea, abdominal pain, jaundice, rectal bleed. No hematemesis.  GENITOURINARY: Denies dysuria, hematuria, renal colic.  ENDOCRINE: Denies polyuria, polydipsia, heat or cold intolerance.  HEMATOLOGY: Denies anemia, easy bruising.  INTEGUMENTARY: Denies acne, rash or skin lesions.  MUSCULOSKELETAL: Complains of chronic neck pain. Denies any swelling or gout or cramps.  NEUROLOGIC: Denies CVA, transient ischemic attack, ataxia, headache, tremors.  PSYCHIATRIC: Denies anxiety, insomnia, bipolar disorder.   PHYSICAL EXAMINATION: VITAL SIGNS: Temperature 97.8, pulse 75, respiratory rate 18, blood pressure 167/73, saturating 93% on room air.  GENERAL: Well-nourished female who looks comfortable in no apparent distress.  HEENT: Atraumatic, normocephalic. Pupils equal, reactive to light. Pink conjunctivae. Anicteric sclerae. Moist oral mucosa. Oral exam was done. There is no evidence of dry blood in the mouth or no throat congestion or inflammation, as well nares were examined and there is evidence of dry blood or bleeding was noticed.   NECK: Supple. No thyromegaly. No JVD.   CHEST: Good air  entry bilaterally. No wheezing, rales, rhonchi.  CARDIOVASCULAR: S1, S2 heard. No rubs, murmur or gallops.  ABDOMEN: Soft, nontender,  nondistended. Bowel sounds present.  EXTREMITIES: No edema. No clubbing. No cyanosis, dorsalis pulse +2 bilaterally.  PSYCHIATRIC: Appropriate affect. Awake, alert x 3. Intact judgment and insight.  NEUROLOGIC: Cranial nerves grossly intact. Motor 5/5. No focal deficits.  LYMPHATICS: No cervical supraclavicular lymphadenopathy.   PERTINENT LABORATORY DATA:  1. Glucose 114, BUN 25, creatinine 1.14, sodium 140, potassium 3.9, CO2 26, ALT 24, AST 23, alkaline phosphatase 73. Troponin less than 0.02. White blood cells 11.7, hemoglobin 13.9, hematocrit 41.4, platelets 217, d-dimer is 1.63. Urinalysis negative for leukocyte esterase and nitrite.  2. EKG showing normal sinus rhythm at 76 beats per minute with old incomplete right bundle branch block with no difference from previous EKG.  3. Imaging: CT chest with IV contrast showing no central or segmental pulmonary emboli and show hiatal hernia.   ASSESSMENT AND PLAN: 1. Hemoptysis. At this point, etiology of  hemoptysis is unclear. The patient will be admitted for observation. We will consult pulmonary/pulmonary service to evaluate the patient if there is any need for a bronchoscopy for further evaluation for the source of hemoptysis, and meanwhile, we will hold all forms of anticoagulation.  2. Hypertension, uncontrolled. We will  resume the patient back on her home medication.  3. Osteoporosis. We will continue patient back on calcium and vitamin D supplements.  4. Deep vein thrombosis prophylaxis, sequential compression device.  5. CODE STATUS: FULL CODE.   TOTAL TIME SPENT ON ADMISSION AND PATIENT CARE: 45 minutes.    ____________________________ Albertine Akhila, MD dse:sg D: 09/08/2013 06:57:00 ET T: 09/08/2013 07:40:24 ET JOB#: [id number]  cc: Albertine Sumayya, MD, <Dictator> Brittnae Aschenbrenner Graciela Husbands MD ELECTRONICALLY SIGNED 09/11/2013 4:56

## 2015-03-20 NOTE — Op Note (Signed)
PATIENT NAME:  Julie Mcbride, Julie Mcbride MR#:  [id number] DATE OF BIRTH:  10/03/1936  DATE OF PROCEDURE:  08/14/2013  PREOPERATIVE DIAGNOSIS:  Painful hardware, right ankle, medial and lateral ankle.   POSTOPERATIVE DIAGNOSIS:   Painful hardware, right ankle, medial and lateral ankle.   PROCEDURE:  Removal of deep hardware, right ankle.   ANESTHESIA:  General.   SURGEON:  Laurene Footman, M.D.   DESCRIPTION OF PROCEDURE:  The patient was brought to the operating room and after adequate anesthesia was obtained, the right leg was prepped and draped in the usual sterile fashion with a bump underneath the right buttock to internally rotate the leg. After prepping and draping in the usual sterile fashion, appropriate patient identification and timeout procedures were completed, and the tourniquet was raised to 300 mmHg.  Skin incisions were utilized. The incision was carried down through the skin and subcutaneous tissue medially and the head of the screw could be palpated. With a hemostat, soft tissue spread to allow for visualization of the screw head The screw was removed without difficulty. Next, the lateral side was opened going through the prior scar exposing the entire plate and then going anteriorly to remove 2 anterior to posterior lag screws. After these 2 screws were removed, all screws were removed from the plate. There was some bursitis overlying the plate. After all screws were removed, the plate was removed, and the hardware discarded as the patient had no interest in keeping her implants. The wounds were thoroughly irrigated. On the lateral side where some of the screw holes had not been filled, there was some scar tissue that had come up into the screw holes. This was debrided. The wounds were closed with 2-0 Vicryl subcutaneously and skin with staples. Xeroform, 4 x 4's, Webril and Ace wrap applied and tourniquet let down.  Tourniquet time was 35 minutes at 300 mmHg.   COMPLICATIONS: None.    SPECIMEN: None.    ____________________________ Laurene Footman, MD mjm:dmm D: 08/14/2013 10:53:38 ET T: 08/14/2013 11:24:18 ET JOB#: [id number]  cc: Laurene Footman, MD, <Dictator> Laurene Footman MD ELECTRONICALLY SIGNED 08/15/2013 7:36

## 2015-03-20 NOTE — Consult Note (Signed)
Chief Complaint:  Subjective/Chief Complaint doing a little better. She has not had any blood since last night. Bronch shows DAH. I have started her on steroids.   VITAL SIGNS/ANCILLARY NOTES: **Vital Signs.:   14-Oct-14 04:21  Vital Signs Type Routine  Temperature Temperature (F) 97.7  Celsius 36.5  Temperature Source oral  Pulse Pulse 78  Respirations Respirations 18  Systolic BP Systolic BP 443  Diastolic BP (mmHg) Diastolic BP (mmHg) 79  Mean BP 104  Pulse Ox % Pulse Ox % 95  Pulse Ox Activity Level  At rest  Oxygen Delivery Room Air/ 21 %    12:17  Pulse Pulse 78  *Intake and Output.:   Daily 14-Oct-14 07:00  Grand Totals Intake:  1727 Output:  50    Net:  1677 24 Hr.:  1540    13:18  Grand Totals Intake:  480 Output:      Net:  480 24 Hr.:  480  Oral Intake      In:  480  Percentage of Meal Eaten  60   Brief Assessment:  GEN no acute distress   Cardiac Regular  -- thrills  -- JVD   Respiratory normal resp effort  no use of accessory muscles  wheezing   Gastrointestinal Normal   Gastrointestinal details normal Soft  Bowel sounds normal   EXTR negative cyanosis/clubbing   Radiology Results: CT:    13-Oct-14 16:09, CT Chest Without Contrast  CT Chest Without Contrast   REASON FOR EXAM:    aveolar hemmorrage  COMMENTS:       PROCEDURE: CT  - CT CHEST WITHOUT CONTRAST  - Sep 09 2013  4:09PM     RESULT:     Technique: Helical noncontrast 3 mm sections were obtained from the   thoracic inlet through the lung bases.     The study was compared to a prior CT pulmonary angiogram dated 09/08/2013.    Findings:  The mediastinum and hilar regions and structures demonstrate   prominent lymph nodes within the precarinal and AP window regions. There   are also findings concerning for slightly prominent lymph nodes in the     hilar regions. Evaluation is limited secondary to lack of IV contrast.   Prominent axillary lymph nodes are identified though  demonstrating fatty   hilum on the left, short axis, measuring 6.9 mm andon the right 6.1 mm   in short axis.     The lung parenchyma demonstrates a diffuse area of ground-glass density   within the right upper lobe. 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 No focal regions of consolidation are   identified or further focal regions of infiltrate.    The visualized upper abdominal viscera demonstrate a low attenuating   focus within the dome of the left lobe of the liver and likely   representing a cyst. A small, fat containing nodule projects within the   posterior aspect of the apex of the left kidney and this may represent a   small lipoma versus a small angiomyolipoma.  IMPRESSION:      1. Diffuse ground-glass density within the right hemithorax likely   reflecting the patient's reported history of pulmonary hemorrhage.  2. No further focal regions of consolidation or focal infiltrates.  3. Nonspecific lymph nodes as described above.      Thank you for this opportunity to contribute to the care of your patient.         Verified By:  Mikki Santee, M.D., MD   Assessment/Plan:  Assessment/Plan:  Assessment 1. DAH -unclear cause -CT now shows some infiltrate -serologies sent -no need for MTB isolation as risk is low -continue with steroids  discussed with family and patient at bedsied   Electronic Signatures: Allyne Gee (MD)  (Signed 14-Oct-14 17:32)  Authored: Chief Complaint, VITAL SIGNS/ANCILLARY NOTES, Brief Assessment, Radiology Results, Assessment/Plan   Last Updated: 14-Oct-14 17:32 by Allyne Gee (MD)

## 2015-03-20 NOTE — Consult Note (Signed)
DATE OF BIRTH:  [birth year]  DATE OF CONSULTATION:  02/02/2013  REQUESTED BY:   Dr. Stacy Gardner, Orthopedics   CONSULTING PHYSICIAN:  Deborra Medina, MD  REASON FOR CONSULT:  Preoperative clearance.  HISTORY OF PRESENT ILLNESS: The patient is a 79 year old white female with a history of hypertension, osteoporosis, and chronic neck pain secondary to cervical spinal disease, who  presented to the ER today with a trimalleolar fracture which occurred after falling on black ice this morning at approximately 8:45 while walking into [employer]. The patient had a reduction done in the ER by Dr. Leida Lauth with conscious sedation, and is scheduled for ORIF of a trimalleolar fracture of the right ankle, and preoperative evaluation has been requested. She is a healthy, elderly female with no history of heart disease. She did have a cardiology workup back in 2010 for palpitations, which included an echocardiogram which showed mild left ventricular hypertrophy, EF 55% to 65%, no wall motion abnormalities. She underwent a Myoview at that time, again which showed an EF 78%, showed no ischemia. Holter monitor at that time showed rare PACs. She has had no chest pain in the last several months, or really ever, for that matter. She has no dyspnea, except with exertion. She is heavyset. She does take several medications for blood pressure, and did not have any of these today due to her injury. She has no history of diabetes or chronic kidney disease, but does have a history of nephrolithiasis. No BMET, chest x-ray or EKG was available at time of evaluation.   Dr. Netty Starring is her primary care provider, she saw him about a month ago for a regularly scheduled visit.   PAST MEDICAL HISTORY: 1.  C2 and C3 fracture in December 2012 secondary to fall. This was not treated surgically, but she wore a neck brace for 3-1/2 months. This was managed by [facility].  2.  Osteoporosis by DEXA scan in 2010.  3.  Palpitations, with  normal cardiac workup.  4.  Hypertension.   CURRENT MEDICATIONS:  She has been taking Fosamax weekly for 2 years. Bisoprolol/ HCTZ 10/6.25, last dose yesterday. Lisinopril 40 mg daily, last dose yesterday. Tramadol 50 mg every 6 hours as needed for pain. Hydralazine 25 mg 3 times daily for hypertension. Skelaxin 800 mg every 8 hours as needed for pain. Vitamin D 1000 units daily.   PAST SURGICAL HISTORY:  She had a laminectomy C5 through C7 remotely. She also has a history of remote hysterectomy. History of benign breast biopsy.   ALLERGIES:  OXYCODONE makes her nervous. SULFA causes hives.    LAST HOSPITALIZATION:  Never, except for hysterectomy and laminectomy remotely.   FAMILY HISTORY:  Mother had ovarian cancer. Father had a stroke.   SOCIAL HISTORY:  She is a lifelong nonsmoker, nondrinker. She works part-time for [employer].   REVIEW OF SYSTEMS:  Negative for recent episodes of fever, fatigue, weakness or weight changes. No blurry or double vision. No history of glaucoma or cataracts. No history of seasonal rhinitis, ear pain, epistaxis, sinus pain, or difficulty swallowing. Respiratory review of systems is negative for cough, wheezing, hemoptysis or dyspnea. No orthopnea, chest pain, edema or dyspnea on exertion. No recent palpitations. No history of syncope. Gastrointestinal review of systems is negative for nausea, vomiting, diarrhea, abdominal pain, or change in bowel habits. She denies any recent history of dysuria or hematuria. She has no history of polyuria, nocturia or thyroid problems. She has no history of anemia, easy bruising or  bleeding. She does have chronic neck pain, which is treated with Ultram and muscle relaxers. She has no recent episodes of rashes, lesions or changes in mole, hair or skin. She denies numbness, weakness, dysarthria, epilepsy, migraines, strokes and seizures. She has no history of anxiety, insomnia or depression.   PHYSICAL  EXAMINATION: GENERAL:  This is heavyset, elderly female in mild pain secondary to recent ankle fracture.  VITAL SIGNS:  Blood pressure is 176/84, pulse 75 and regular, temperature 99.1, respirations to 20, she is satting 98% on room air.  HEENT:  Pupils are equal, round,  reactive to light. Extraocular movements are intact. Sclerae are anicteric. Oropharynx is benign.  NECK:  Supple, without lymphadenopathy, JVD, thyromegaly or carotid bruits.  LUNGS:  Clear to auscultation bilaterally, all lung fields.  CARDIOVASCULAR:  Regular rate and rhythm, with no murmurs, rubs or gallops. Chest wall is nontender, and she has palpable pedal pulses.  ABDOMEN: Soft, nontender, nondistended, with good bowel sounds and no evidence of hepatosplenomegaly.   MUSCULOSKELETAL:  She has 5/5 strength in both arms in in her left leg. Right leg was not tested due to fracture. She has a normal skin exam.  No lesions noted. There is no cervical, axillary, inguinal or supraclavicular lymphadenopathy.  NEUROLOGIC:  Exam is grossly nonfocal.  PSYCHIATRIC: She is alert and oriented to person, place and time.   ADMISSION LABORATORY DATA:  White count of 11.7, hemoglobin 14.2, platelets 216. Basic metabolic panel is pending. A 12-lead EKG shows normal sinus rhythm with left axis deviation, and meets criteria for LVH, with mild QRS widening. There is no prior EKG for comparison. Portable chest x-ray is pending.   ASSESSMENT AND PLAN: A 79 year old white female with no significant cardiac history.  Given her lack of cardiac, renal or endocrine history, she is considered low to intermediate risk for perioperative events. She does have an abnormal EKG, no prior for comparison. However, she did have a noninvasive cardiac evaluation in 2010, which noted a healthy ejection fraction, no wall motion abnormalities, and no ischemia on a noninvasive stress test. Therefore, will hold her hydrochlorothiazide for now and metoprolol  preoperatively. Her electrolytes have not been checked, and these are pending. I will follow up with this and replace anything that needs to be replaced prior to surgery. A portable chest x-ray has been ordered as a formality. She has a clear lung exam, and no signs of congestive heart failure.   Thank you for this interesting consult.      ____________________________ Deborra Medina, MD tt:mr D: 02/02/2013 19:36:00 ET T: 02/02/2013 21:15:24 ET JOB#: [id number]  cc: Deborra Medina, MD, <Dictator> Deborra Medina MD ELECTRONICALLY SIGNED 03/20/2013 13:18

## 2015-03-21 NOTE — Op Note (Signed)
PATIENT NAME:  Julie Mcbride, TSCHIDA MR#:  [id number] DATE OF BIRTH:  August 15, 1936  DATE OF PROCEDURE:  06/05/2014.  PREOPERATIVE DIAGNOSIS: Right maxillary sinus mass and chronic rhinosinusitis.   POSTOPERATIVE DIAGNOSIS: Right maxillary sinus mass and chronic rhinosinusitis.   PROCEDURES PERFORMED:  1.  Right maxillary antrostomy with tissue removal.  2.  Image-guided sinus surgery.   SURGEON: Jerene Bears, M.D.   ANESTHESIA: General endotracheal anesthesia.   ESTIMATED BLOOD LOSS: 15 mL.   SPECIMENS: Right maxillary sinus mass.   COMPLICATIONS: None.   DRAINS/STENT PLACEMENTS: NasoPore.   INDICATIONS FOR PROCEDURE: The patient is a 79 year old female with a history of chronic rhinosinusitis, postnasal drip and nasal obstruction resistant to medical management and found on endoscopy as well as CT scan to have chronic right maxillary sinusitis as well as a large mass emanating from of the right maxillary sinus.   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 A large maxillary antrostomy was performed. A large right concha bullosa was reduced.   DESCRIPTION OF PROCEDURE: After the patient was identified in holding, benefits and risks of the procedure were discussed and consent was reviewed. The patient was taken to the operating room and placed in the supine position. General endotracheal anesthesia was induced. The patient was rotated to 90 degrees. The Stryker image-guided sinus system was set up and calibrated in normal fashion. This was referred to throughout the duration the case to ensure proper positioning and opening of all the affected sinuses was performed.   At this time, the patient was prepped and draped in sterile fashion after injection of 3.5 mL of 1% lidocaine with 1:100,000 epinephrine into the anterior septum, anterior/inferior turbinate on the right side, as well as anterior middle turbinate on the  right side. A 0-degree endoscope was brought into the field and a large polyp was noted emanating from the maxillary os. There was an anterior accessory os noted, as well. There is also a large right-sided concha bullosa.   At this time a Soil scientist was used to medialize the right middle turbinate. A large cup forceps was used to grasp the large polyp that was extending into the patient's nasopharynx. This was grasped and removed.  A large fluid collection was noted and the polyp was removed in several separate pieces and this revealed complete resolution of the nasopharyngeal mass.   At this time, a Diego microdebrider was used to enlarge the maxillary antrostomy that had already been created and remove a portion of the uncinate process.  A Freer elevator was used to enter the concha bullosa, and using a combination of Diego microdebrider as well as the straight through-cutting forceps, the lateral wall of the concha bullosa was resected.   At this time, the patient's nasal cavity was copiously irrigated with sterile saline. Meticulous hemostasis was ensured using topical Afrin.  NasoPore was placed on the cut edge of the concha bullosa as well as the maxillary antrostomy. Visualization within the maxillary sinus prior to placement of the NasoPore revealed complete resolution of all debris and further polypoid masses.   At this time, care of the patient was transferred to anesthesia.    ____________________________ Jerene Bears, MD ccv:lt D: 06/05/2014 08:02:13 ET T: 06/05/2014 08:50:48 ET JOB#: [id number]  cc: Jerene Bears, MD, <Dictator> Jerene Bears MD ELECTRONICALLY SIGNED 06/10/2014 18:08

## 2015-05-05 ENCOUNTER — Other Ambulatory Visit: Payer: Self-pay | Admitting: Family Medicine

## 2015-05-05 DIAGNOSIS — Z1231 Encounter for screening mammogram for malignant neoplasm of breast: Secondary | ICD-10-CM

## 2015-05-13 ENCOUNTER — Other Ambulatory Visit: Payer: Self-pay | Admitting: Family Medicine

## 2015-05-13 ENCOUNTER — Ambulatory Visit
Admission: RE | Admit: 2015-05-13 | Discharge: 2015-05-13 | Disposition: A | Discharge status: Home / Self Care | Payer: Medicare Other | Source: Ambulatory Visit | Attending: Family Medicine | Admitting: Family Medicine

## 2015-05-13 DIAGNOSIS — Z1231 Encounter for screening mammogram for malignant neoplasm of breast: Secondary | ICD-10-CM | POA: Diagnosis present

## 2015-10-06 ENCOUNTER — Emergency Department: Payer: Medicare Other

## 2015-10-06 DIAGNOSIS — R2242 Localized swelling, mass and lump, left lower limb: Secondary | ICD-10-CM | POA: Diagnosis not present

## 2015-10-06 DIAGNOSIS — Z79899 Other long term (current) drug therapy: Secondary | ICD-10-CM | POA: Diagnosis not present

## 2015-10-06 DIAGNOSIS — Z91040 Latex allergy status: Secondary | ICD-10-CM | POA: Insufficient documentation

## 2015-10-06 DIAGNOSIS — I10 Essential (primary) hypertension: Secondary | ICD-10-CM | POA: Insufficient documentation

## 2015-10-06 DIAGNOSIS — Z792 Long term (current) use of antibiotics: Secondary | ICD-10-CM | POA: Insufficient documentation

## 2015-10-06 DIAGNOSIS — M25561 Pain in right knee: Secondary | ICD-10-CM | POA: Diagnosis not present

## 2015-10-06 NOTE — ED Notes (Signed)
Pt to triage via w/c with no distress noted; reports seen Julie Mcbride 9/29 and received a cortisone injection to right lower leg; seen McGee on 10/5 and rx muscle relaxer without relief; c/o persistent right lower leg/calf pain and behind knee with swelling; denies SOB or CP; denies any injury; has not had any imagining

## 2015-10-07 ENCOUNTER — Emergency Department (HOSPITAL_COMMUNITY)
Admission: EM | Admit: 2015-10-07 | Discharge: 2015-10-07 | Disposition: A | Discharge status: Home / Self Care | Payer: Medicare Other | Attending: Emergency Medicine | Admitting: Emergency Medicine

## 2015-10-07 ENCOUNTER — Emergency Department: Payer: Medicare Other

## 2015-10-07 ENCOUNTER — Emergency Department
Admission: EM | Admit: 2015-10-07 | Discharge: 2015-10-07 | Disposition: A | Discharge status: Home / Self Care | Payer: Medicare Other | Attending: Emergency Medicine | Admitting: Emergency Medicine

## 2015-10-07 ENCOUNTER — Encounter (HOSPITAL_COMMUNITY): Payer: Self-pay

## 2015-10-07 DIAGNOSIS — Z8551 Personal history of malignant neoplasm of bladder: Secondary | ICD-10-CM | POA: Diagnosis not present

## 2015-10-07 DIAGNOSIS — Z792 Long term (current) use of antibiotics: Secondary | ICD-10-CM | POA: Insufficient documentation

## 2015-10-07 DIAGNOSIS — Z91040 Latex allergy status: Secondary | ICD-10-CM | POA: Diagnosis not present

## 2015-10-07 DIAGNOSIS — M25561 Pain in right knee: Secondary | ICD-10-CM | POA: Diagnosis not present

## 2015-10-07 DIAGNOSIS — Z9889 Other specified postprocedural states: Secondary | ICD-10-CM | POA: Insufficient documentation

## 2015-10-07 DIAGNOSIS — Z79899 Other long term (current) drug therapy: Secondary | ICD-10-CM | POA: Diagnosis not present

## 2015-10-07 DIAGNOSIS — I10 Essential (primary) hypertension: Secondary | ICD-10-CM | POA: Diagnosis not present

## 2015-10-07 DIAGNOSIS — Z8742 Personal history of other diseases of the female genital tract: Secondary | ICD-10-CM | POA: Diagnosis not present

## 2015-10-07 DIAGNOSIS — K219 Gastro-esophageal reflux disease without esophagitis: Secondary | ICD-10-CM | POA: Insufficient documentation

## 2015-10-07 DIAGNOSIS — M79604 Pain in right leg: Secondary | ICD-10-CM | POA: Insufficient documentation

## 2015-10-07 DIAGNOSIS — Z90710 Acquired absence of both cervix and uterus: Secondary | ICD-10-CM | POA: Insufficient documentation

## 2015-10-07 LAB — CBC
HEMATOCRIT: 42.7 % (ref 36.0–46.0)
Hemoglobin: 13.9 g/dL (ref 12.0–15.0)
MCH: 30.3 pg (ref 26.0–34.0)
MCHC: 32.6 g/dL (ref 30.0–36.0)
MCV: 93.2 fL (ref 78.0–100.0)
Platelets: 210 10*3/uL (ref 150–400)
RBC: 4.58 MIL/uL (ref 3.87–5.11)
RDW: 14.1 % (ref 11.5–15.5)
WBC: 9.4 10*3/uL (ref 4.0–10.5)

## 2015-10-07 LAB — BASIC METABOLIC PANEL
Anion gap: 8 (ref 5–15)
BUN: 18 mg/dL (ref 6–20)
CALCIUM: 9.3 mg/dL (ref 8.9–10.3)
CO2: 28 mmol/L (ref 22–32)
CREATININE: 1.06 mg/dL — AB (ref 0.44–1.00)
Chloride: 103 mmol/L (ref 101–111)
GFR calc Af Amer: 57 mL/min — ABNORMAL LOW (ref >60)
GFR calc non Af Amer: 49 mL/min — ABNORMAL LOW (ref >60)
GLUCOSE: 115 mg/dL — AB (ref 65–99)
Potassium: 4.3 mmol/L (ref 3.5–5.1)
Sodium: 139 mmol/L (ref 135–145)

## 2015-10-07 MED ORDER — TRAMADOL HCL 50 MG PO TABS
ORAL_TABLET | ORAL | Status: AC
  Administered 2015-10-07: 100 mg via ORAL
  Filled 2015-10-07: qty 2

## 2015-10-07 MED ORDER — TRAMADOL HCL 50 MG PO TABS
100.0000 mg | ORAL_TABLET | Freq: Once | ORAL | Status: AC
  Administered 2015-10-07: 100 mg via ORAL

## 2015-10-07 MED ORDER — HYDROCODONE-ACETAMINOPHEN 5-325 MG PO TABS: 1.0000 | ORAL_TABLET | ORAL | Status: DC | PRN

## 2015-10-07 MED ORDER — TRAMADOL HCL 50 MG PO TABS: 50.0000 mg | ORAL_TABLET | Freq: Two times a day (BID) | ORAL | Status: DC

## 2015-10-07 MED ORDER — HYDROCODONE-ACETAMINOPHEN 5-325 MG PO TABS
2.0000 | ORAL_TABLET | Freq: Once | ORAL | Status: AC
  Administered 2015-10-07: 2 via ORAL

## 2015-10-07 MED ORDER — HYDROCODONE-ACETAMINOPHEN 5-325 MG PO TABS
ORAL_TABLET | ORAL | Status: AC
  Administered 2015-10-07: 2 via ORAL
  Filled 2015-10-07: qty 2

## 2015-10-07 NOTE — ED Notes (Signed)
Pt refused crutches due to having a walker in her car.

## 2015-10-07 NOTE — ED Provider Notes (Signed)
[facility] Emergency Department Provider Note  ____________________________________________  Time seen: 12:35 AM  I have reviewed the triage vital signs and the nursing notes.   HISTORY  Chief Complaint Leg Pain    HPI Julie Mcbride is a 79 y.o. female presents with atraumatic right knee pain times "months with acute worsening the last few days. Patient states current pain score is 10 out of 10 with pain in the right knee extending to the right leg and calf. Patient also admits to swelling posterior to the knee. Patient denies any fever     Past Medical History  Diagnosis Date  . Multiple allergies     Chronic  . Postmenopausal   . GERD (gastroesophageal reflux disease)   . Hypertension   . Diverticulitis   . Bladder polyp     With previos hematuria  . S/P laminectomy   . S/P hysterectomy   . Dyspnea on exertion     Chronic  . History of ETT     Myoview 7/10 EF 78%, no ischemia or infarction. Poor exercise tolerance (2'30", stopped due to fatigue). Reached 86% MPHR.  Marland Kitchen Palpitations     Holter 7/10 with rare PACs    Patient Active Problem List   Diagnosis Date Noted  . SHORTNESS OF BREATH 06/17/2009  . HYPERTENSION, BENIGN 05/27/2009  . PALPITATIONS 05/27/2009  . ABNORMAL EKG 05/27/2009    Past Surgical History  Procedure Laterality Date  . Posterior cervical laminectomy    . Breast biopsy Right 1996    neg    Current Outpatient Rx  Name  Route  Sig  Dispense  Refill  . alendronate (FOSAMAX) 70 MG tablet   Oral   Take 70 mg by mouth every 7 (seven) days. Take with a full glass of water on an empty stomach.          . bisoprolol-hydrochlorothiazide (ZIAC) 10-6.25 MG per tablet   Oral   Take 1 tablet by mouth daily.           . ciprofloxacin (CIPRO) 500 MG tablet   Oral   Take 250 mg by mouth 2 (two) times daily.           Marland Kitchen lisinopril (PRINIVIL,ZESTRIL) 40 MG tablet   Oral   Take 40 mg by mouth daily.            . ranitidine (ZANTAC) 150 MG tablet   Oral   Take 150 mg by mouth 2 (two) times daily.             Allergies Cyclobenzaprine hcl; Etodolac; Latex; Metaxalone; Neomycin-bacitracin zn-polymyx; Nitrofurantoin; Sulfamethoxazole-trimethoprim; and Sulfonamide derivatives  Family History  Problem Relation Age of Onset  . Ovarian cancer Mother   . Stroke Father     CVA  . Breast cancer Daughter 33  . Breast cancer Maternal Aunt 50    Social History Social History  Substance Use Topics  . Smoking status: Unknown If Ever Smoked  . Smokeless tobacco: Not on file  . Alcohol Use: No    Review of Systems  Constitutional: Negative for fever. Eyes: Negative for visual changes. ENT: Negative for sore throat. Cardiovascular: Negative for chest pain. Respiratory: Negative for shortness of breath. Gastrointestinal: Negative for abdominal pain, vomiting and diarrhea. Genitourinary: Negative for dysuria. Musculoskeletal: Negative for back pain.Positive for right knee pain Skin: Negative for rash. Neurological: Negative for headaches, focal weakness or numbness.   10-point ROS otherwise negative.  ____________________________________________   PHYSICAL EXAM:  VITAL SIGNS: ED Triage Vitals  Enc Vitals Group     BP 10/06/15 2239 143/74 mmHg     Pulse Rate 10/06/15 2239 73     Resp --      Temp 10/06/15 2239 98 F (36.7 C)     Temp Source 10/06/15 2239 Oral     SpO2 10/06/15 2239 92 %     Weight --      Height 10/06/15 2239 5\' 6"  (1.676 m)     Head Cir --      Peak Flow --      Pain Score 10/06/15 2245 10     Pain Loc --      Pain Edu? --      Excl. in [place]? --      Constitutional: Alert and oriented. Well appearing and in no distress. Eyes: Conjunctivae are normal. PERRL. Normal extraocular movements. ENT   Head: Normocephalic and atraumatic.   Nose: No congestion/rhinnorhea.   Mouth/Throat: Mucous membranes are moist.   Neck: No  stridor. Hematological/Lymphatic/Immunilogical: No cervical lymphadenopathy. Cardiovascular: Normal rate, regular rhythm. Normal and symmetric distal pulses are present in all extremities. No murmurs, rubs, or gallops. Respiratory: Normal respiratory effort without tachypnea nor retractions. Breath sounds are clear and equal bilaterally. No wheezes/rales/rhonchi. Gastrointestinal: Soft and nontender. No distention. There is no CVA tenderness. Genitourinary: deferred Musculoskeletal: pain with valgus and varus stress tests of the right knee. Pain with palpation over the tibial plateau as well. Neurologic:  Normal speech and language. No gross focal neurologic deficits are appreciated. Speech is normal.  Skin:  Skin is warm, dry and intact. No rash noted.     RADIOLOGY  DG Knee Complete 4 Views Right (Final result) Result time: 10/07/15 01:24:38   Final result by Rad Results In Interface (10/07/15 01:24:38)   Narrative:   CLINICAL DATA: Right knee pain, sudden onset when getting in car. Initial encounter.  EXAM: RIGHT KNEE - COMPLETE 4+ VIEW  COMPARISON: 02/02/2013  FINDINGS: There is no evidence of fracture, dislocation, or joint effusion. There is no evidence of arthropathy or other focal bone abnormality. Soft tissues are unremarkable.  IMPRESSION: Negative.   Electronically Signed By: Monte Fantasia M.D. On: 10/07/2015 01:24          US Venous Img Lower Unilateral Right (Final result) Result time: 10/06/15 23:58:10   Final result by Rad Results In Interface (10/06/15 23:58:10)   Narrative:   CLINICAL DATA: Right lower extremity pain swelling for several weeks.  EXAM: Right LOWER EXTREMITY VENOUS DOPPLER ULTRASOUND  TECHNIQUE: Gray-scale sonography with graded compression, as well as color Doppler and duplex ultrasound were performed to evaluate the lower extremity deep venous systems from the level of the common femoral vein and including the  common femoral, femoral, profunda femoral, popliteal and calf veins including the posterior tibial, peroneal and gastrocnemius veins when visible. The superficial great saphenous vein was also interrogated. Spectral Doppler was utilized to evaluate flow at rest and with distal augmentation maneuvers in the common femoral, femoral and popliteal veins.  COMPARISON: None.  FINDINGS: Contralateral Common Femoral Vein: Respiratory phasicity is normal and symmetric with the symptomatic side. No evidence of thrombus. Normal compressibility.  Common Femoral Vein: No evidence of thrombus. Normal compressibility, respiratory phasicity and response to augmentation.  Saphenofemoral Junction: No evidence of thrombus. Normal compressibility and flow on color Doppler imaging.  Profunda Femoral Vein: No evidence of thrombus. Normal compressibility and flow on color Doppler imaging.  Femoral Vein: No evidence of thrombus.  Normal compressibility, respiratory phasicity and response to augmentation.  Popliteal Vein: No evidence of thrombus. Normal compressibility, respiratory phasicity and response to augmentation.  Calf Veins: No evidence of thrombus. Normal compressibility and flow on color Doppler imaging.  Superficial Great Saphenous Vein: No evidence of thrombus. Normal compressibility and flow on color Doppler imaging.  Venous Reflux: None.  Other Findings: None.  IMPRESSION: No evidence of deep venous thrombosis.   Electronically Signed By: Marijo Sanes M.D. On: 10/06/2015 23:58            INITIAL IMPRESSION / ASSESSMENT AND PLAN / ED COURSE  Pertinent labs & imaging results that were available during my care of the patient were reviewed by me and considered in my medical decision making (see chart for details).    ____________________________________________   FINAL CLINICAL IMPRESSION(S) / ED DIAGNOSES  Final diagnoses:  Right knee pain       Gregor Hams, MD 10/07/15 [phone]

## 2015-10-07 NOTE — ED Notes (Signed)
MD at bedside. 

## 2015-10-07 NOTE — ED Provider Notes (Signed)
CSN: [id number]     Arrival date & time 10/07/15  1743 History   First MD Initiated Contact with Patient 10/07/15 1828     Chief Complaint  Patient presents with  . Leg Pain      HPI Patient presents emergency department with ongoing discomfort and pain in her anterior right lower leg over the past 2 months.  She seen her orthopedic surgeon had a cortisone injection.  She was seen at the [facility] emergency department last night and evaluated for ongoing discomfort and pain.  Last night she underwent plain x-ray of her right knee and a venous duplex of her right lower extremity which demonstrated no DVT.  Plain film of her right knee was normal.  She states that the pain is not improved with hydrocodone that was prescribed to her.  She is concerned that she may need an MRI.  She denies fevers and chills.  No overlying skin changes.  She denies initial injury or trauma to the region.  No history DVT.  She reports a pulling discomfort and pain with plantar flexion of her right foot.  No swelling of her right thigh.   Past Medical History  Diagnosis Date  . Multiple allergies     Chronic  . Postmenopausal   . GERD (gastroesophageal reflux disease)   . Hypertension   . Diverticulitis   . Bladder polyp     With previos hematuria  . S/P laminectomy   . S/P hysterectomy   . Dyspnea on exertion     Chronic  . History of ETT     Myoview 7/10 EF 78%, no ischemia or infarction. Poor exercise tolerance (2'30", stopped due to fatigue). Reached 86% MPHR.  Marland Kitchen Palpitations     Holter 7/10 with rare PACs   Past Surgical History  Procedure Laterality Date  . Posterior cervical laminectomy    . Breast biopsy Right 1996    neg   Family History  Problem Relation Age of Onset  . Ovarian cancer Mother   . Stroke Father     CVA  . Breast cancer Daughter 26  . Breast cancer Maternal Aunt 82   Social History  Substance Use Topics  . Smoking status: Never Smoker   . Smokeless tobacco: None  .  Alcohol Use: No   OB History    No data available     Review of Systems  All other systems reviewed and are negative.     Allergies  Cyclobenzaprine hcl; Etodolac; Latex; Metaxalone; Neomycin-bacitracin zn-polymyx; Nitrofurantoin; Sulfamethoxazole-trimethoprim; and Sulfonamide derivatives  Home Medications   Prior to Admission medications   Medication Sig Start Date End Date Taking? Authorizing Provider  bisoprolol-hydrochlorothiazide (ZIAC) 10-6.25 MG per tablet Take 1 tablet by mouth daily.     Yes Historical Provider, MD  Cholecalciferol (VITAMIN D3) 1000 UNITS CHEW Chew 1 tablet by mouth daily.   Yes Historical Provider, MD  cyclobenzaprine (FLEXERIL) 5 MG tablet Take 5 mg by mouth as needed for muscle spasms.   Yes Historical Provider, MD  hydrALAZINE (APRESOLINE) 25 MG tablet Take 25 mg by mouth 3 (three) times daily.   Yes Historical Provider, MD  HYDROcodone-acetaminophen (NORCO/VICODIN) 5-325 MG tablet Take 1 tablet by mouth every 4 (four) hours as needed for moderate pain. 10/07/15  Yes Gregor Hams, MD  losartan (COZAAR) 50 MG tablet Take 50 mg by mouth daily.   Yes Historical Provider, MD  ranitidine (ZANTAC) 150 MG tablet Take 150 mg by mouth 2 (two) times  daily.     Yes Historical Provider, MD  traMADol (ULTRAM) 50 MG tablet Take 1 tablet (50 mg total) by mouth 2 (two) times daily. 10/07/15  Yes Gregor Hams, MD  ciprofloxacin (CIPRO) 500 MG tablet Take 250 mg by mouth 2 (two) times daily.      Historical Provider, MD  lisinopril (PRINIVIL,ZESTRIL) 40 MG tablet Take 40 mg by mouth daily.      Historical Provider, MD   BP 155/68 mmHg  Pulse 65  Temp(Src) 98.3 F (36.8 C) (Oral)  Resp 18  SpO2 93% Physical Exam  Constitutional: She is oriented to person, place, and time. She appears well-developed and well-nourished. No distress.  HENT:  Head: Normocephalic and atraumatic.  Eyes: EOM are normal.  Neck: Normal range of motion.  Cardiovascular: Normal rate,  regular rhythm and normal heart sounds.   Pulmonary/Chest: Effort normal and breath sounds normal.  Abdominal: Soft. She exhibits no distension. There is no tenderness.  Musculoskeletal: Normal range of motion.  Small amount of palpable swelling and fullness of her anterior right proximal tibia in the midline.  There is no overlying skin changes.  She has full range of motion of her right ankle and her right knee.  She has normal pulses in her right foot.  Her compartments of her right lower extremity are overall soft.  She has no tenderness or fullness of her calf.  Note dimpling of the skin.  Neurological: She is alert and oriented to person, place, and time.  Skin: Skin is warm and dry.  Psychiatric: She has a normal mood and affect. Judgment normal.  Nursing note and vitals reviewed.   ED Course  Procedures (including critical care time) Labs Review Labs Reviewed  CBC  BASIC METABOLIC PANEL    Imaging Review US Venous Img Lower Unilateral Right  10/06/2015  CLINICAL DATA:  Right lower extremity pain swelling for several weeks. EXAM: Right LOWER EXTREMITY VENOUS DOPPLER ULTRASOUND TECHNIQUE: Gray-scale sonography with graded compression, as well as color Doppler and duplex ultrasound were performed to evaluate the lower extremity deep venous systems from the level of the common femoral vein and including the common femoral, femoral, profunda femoral, popliteal and calf veins including the posterior tibial, peroneal and gastrocnemius veins when visible. The superficial great saphenous vein was also interrogated. Spectral Doppler was utilized to evaluate flow at rest and with distal augmentation maneuvers in the common femoral, femoral and popliteal veins. COMPARISON:  None. FINDINGS: Contralateral Common Femoral Vein: Respiratory phasicity is normal and symmetric with the symptomatic side. No evidence of thrombus. Normal compressibility. Common Femoral Vein: No evidence of thrombus. Normal  compressibility, respiratory phasicity and response to augmentation. Saphenofemoral Junction: No evidence of thrombus. Normal compressibility and flow on color Doppler imaging. Profunda Femoral Vein: No evidence of thrombus. Normal compressibility and flow on color Doppler imaging. Femoral Vein: No evidence of thrombus. Normal compressibility, respiratory phasicity and response to augmentation. Popliteal Vein: No evidence of thrombus. Normal compressibility, respiratory phasicity and response to augmentation. Calf Veins: No evidence of thrombus. Normal compressibility and flow on color Doppler imaging. Superficial Great Saphenous Vein: No evidence of thrombus. Normal compressibility and flow on color Doppler imaging. Venous Reflux:  None. Other Findings:  None. IMPRESSION: No evidence of deep venous thrombosis. Electronically Signed   By: Marijo Sanes M.D.   On: 10/06/2015 23:58   Dg Knee Complete 4 Views Right  10/07/2015  CLINICAL DATA:  Right knee pain, sudden onset when getting in car. Initial encounter.  EXAM: RIGHT KNEE - COMPLETE 4+ VIEW COMPARISON:  02/02/2013 FINDINGS: There is no evidence of fracture, dislocation, or joint effusion. There is no evidence of arthropathy or other focal bone abnormality. Soft tissues are unremarkable. IMPRESSION: Negative. Electronically Signed   By: Monte Fantasia M.D.   On: 10/07/2015 01:24   I have personally reviewed and evaluated these images and lab results as part of my medical decision-making.   EKG Interpretation None      MDM   Final diagnoses:  None    Patient will need ongoing orthopedic follow-up and likely outpatient MRI.  I do not believe there is no indication for emergent MRI tonight.  She'll need this worked up further.  This could represent hematoma or some sort of muscle strain.  I suspicion for deep space infection is low.  Her white blood cell count is normal.  Patient has scheduled follow-up with her orthopedic surgeon in 36 hours.  I  recommend that she continue to follow-up with her orthopedic team.    Jola Schmidt, MD 10/08/15 [phone]

## 2015-10-07 NOTE — Discharge Instructions (Signed)

## 2015-10-07 NOTE — ED Notes (Signed)
Pt. Is having rt. Lower calf pain.  Increases with flexion and weight bearing.  She was seen by [address] yesterday and the pain has not improved. No redness noted or warmth.  Mild swelling noted below the knee

## 2015-10-07 NOTE — ED Notes (Signed)

## 2015-10-12 ENCOUNTER — Other Ambulatory Visit: Payer: Self-pay | Admitting: Orthopedic Surgery

## 2015-10-12 DIAGNOSIS — M898X6 Other specified disorders of bone, lower leg: Secondary | ICD-10-CM

## 2015-10-20 ENCOUNTER — Ambulatory Visit (HOSPITAL_COMMUNITY)
Admission: RE | Admit: 2015-10-20 | Discharge: 2015-10-20 | Disposition: A | Discharge status: Home / Self Care | Payer: Medicare Other | Source: Ambulatory Visit | Attending: Orthopedic Surgery | Admitting: Orthopedic Surgery

## 2015-10-20 DIAGNOSIS — M7121 Synovial cyst of popliteal space [Baker], right knee: Secondary | ICD-10-CM | POA: Insufficient documentation

## 2015-10-20 DIAGNOSIS — M898X6 Other specified disorders of bone, lower leg: Secondary | ICD-10-CM | POA: Insufficient documentation

## 2015-10-29 ENCOUNTER — Ambulatory Visit: Payer: Medicare Other

## 2016-02-11 ENCOUNTER — Other Ambulatory Visit: Payer: Self-pay | Admitting: Unknown Physician Specialty

## 2016-02-11 DIAGNOSIS — R600 Localized edema: Secondary | ICD-10-CM

## 2016-02-11 DIAGNOSIS — M25461 Effusion, right knee: Secondary | ICD-10-CM

## 2016-03-02 ENCOUNTER — Ambulatory Visit
Admission: RE | Admit: 2016-03-02 | Discharge: 2016-03-02 | Disposition: A | Discharge status: Home / Self Care | Payer: Medicare Other | Source: Ambulatory Visit | Attending: Unknown Physician Specialty | Admitting: Unknown Physician Specialty

## 2016-03-02 DIAGNOSIS — R600 Localized edema: Secondary | ICD-10-CM

## 2016-03-02 DIAGNOSIS — S83271A Complex tear of lateral meniscus, current injury, right knee, initial encounter: Secondary | ICD-10-CM | POA: Insufficient documentation

## 2016-03-02 DIAGNOSIS — M948X6 Other specified disorders of cartilage, lower leg: Secondary | ICD-10-CM | POA: Diagnosis not present

## 2016-03-02 DIAGNOSIS — M25461 Effusion, right knee: Secondary | ICD-10-CM

## 2016-04-19 ENCOUNTER — Encounter: Payer: Self-pay | Admitting: *Deleted

## 2016-04-22 ENCOUNTER — Encounter
Admission: RE | Disposition: A | Discharge status: Home / Self Care | Payer: Self-pay | Source: Ambulatory Visit | Attending: Unknown Physician Specialty

## 2016-04-22 ENCOUNTER — Ambulatory Visit: Payer: Medicare Other | Admitting: Anesthesiology

## 2016-04-22 ENCOUNTER — Ambulatory Visit
Admission: RE | Admit: 2016-04-22 | Discharge: 2016-04-22 | Disposition: A | Discharge status: Home / Self Care | Payer: Medicare Other | Source: Ambulatory Visit | Attending: Unknown Physician Specialty | Admitting: Unknown Physician Specialty

## 2016-04-22 DIAGNOSIS — Z79899 Other long term (current) drug therapy: Secondary | ICD-10-CM | POA: Diagnosis not present

## 2016-04-22 DIAGNOSIS — E669 Obesity, unspecified: Secondary | ICD-10-CM | POA: Diagnosis not present

## 2016-04-22 DIAGNOSIS — M238X1 Other internal derangements of right knee: Secondary | ICD-10-CM | POA: Insufficient documentation

## 2016-04-22 DIAGNOSIS — I517 Cardiomegaly: Secondary | ICD-10-CM | POA: Insufficient documentation

## 2016-04-22 DIAGNOSIS — M810 Age-related osteoporosis without current pathological fracture: Secondary | ICD-10-CM | POA: Insufficient documentation

## 2016-04-22 DIAGNOSIS — I10 Essential (primary) hypertension: Secondary | ICD-10-CM | POA: Diagnosis not present

## 2016-04-22 DIAGNOSIS — M25561 Pain in right knee: Secondary | ICD-10-CM | POA: Diagnosis present

## 2016-04-22 DIAGNOSIS — Z85828 Personal history of other malignant neoplasm of skin: Secondary | ICD-10-CM | POA: Diagnosis not present

## 2016-04-22 DIAGNOSIS — Z8041 Family history of malignant neoplasm of ovary: Secondary | ICD-10-CM | POA: Diagnosis not present

## 2016-04-22 DIAGNOSIS — Z6835 Body mass index (BMI) 35.0-35.9, adult: Secondary | ICD-10-CM | POA: Diagnosis not present

## 2016-04-22 DIAGNOSIS — I4510 Unspecified right bundle-branch block: Secondary | ICD-10-CM | POA: Diagnosis not present

## 2016-04-22 DIAGNOSIS — M23251 Derangement of posterior horn of lateral meniscus due to old tear or injury, right knee: Secondary | ICD-10-CM | POA: Insufficient documentation

## 2016-04-22 DIAGNOSIS — E559 Vitamin D deficiency, unspecified: Secondary | ICD-10-CM | POA: Diagnosis not present

## 2016-04-22 DIAGNOSIS — Z90710 Acquired absence of both cervix and uterus: Secondary | ICD-10-CM | POA: Insufficient documentation

## 2016-04-22 DIAGNOSIS — Z823 Family history of stroke: Secondary | ICD-10-CM | POA: Diagnosis not present

## 2016-04-22 DIAGNOSIS — K219 Gastro-esophageal reflux disease without esophagitis: Secondary | ICD-10-CM | POA: Diagnosis not present

## 2016-04-22 HISTORY — PX: KNEE ARTHROSCOPY: SHX127

## 2016-04-22 HISTORY — DX: Basal cell carcinoma of skin, unspecified: C44.91

## 2016-04-22 HISTORY — DX: Unspecified osteoarthritis, unspecified site: M19.90

## 2016-04-22 HISTORY — DX: Calculus of kidney: N20.0

## 2016-04-22 SURGERY — ARTHROSCOPY, KNEE
Anesthesia: General | Laterality: Right

## 2016-04-22 MED ORDER — LIDOCAINE HCL (CARDIAC) 20 MG/ML IV SOLN
INTRAVENOUS | Status: DC | PRN
  Administered 2016-04-22: 40 mg via INTRATRACHEAL

## 2016-04-22 MED ORDER — ONDANSETRON HCL 4 MG/2ML IJ SOLN
INTRAMUSCULAR | Status: DC | PRN
  Administered 2016-04-22: 4 mg via INTRAVENOUS

## 2016-04-22 MED ORDER — FENTANYL CITRATE (PF) 100 MCG/2ML IJ SOLN
INTRAMUSCULAR | Status: DC | PRN
  Administered 2016-04-22: 100 ug via INTRAVENOUS

## 2016-04-22 MED ORDER — OXYCODONE HCL 5 MG PO TABS: 5.0000 mg | ORAL_TABLET | Freq: Once | ORAL | Status: DC | PRN

## 2016-04-22 MED ORDER — DEXAMETHASONE SODIUM PHOSPHATE 4 MG/ML IJ SOLN
INTRAMUSCULAR | Status: DC | PRN
  Administered 2016-04-22: 4 mg via INTRAVENOUS

## 2016-04-22 MED ORDER — ACETAMINOPHEN 325 MG PO TABS
325.0000 mg | ORAL_TABLET | ORAL | Status: DC | PRN
  Administered 2016-04-22: 650 mg via ORAL

## 2016-04-22 MED ORDER — OXYCODONE HCL 5 MG/5ML PO SOLN: 5.0000 mg | Freq: Once | ORAL | Status: DC | PRN

## 2016-04-22 MED ORDER — MIDAZOLAM HCL 5 MG/5ML IJ SOLN
INTRAMUSCULAR | Status: DC | PRN
  Administered 2016-04-22: 2 mg via INTRAVENOUS

## 2016-04-22 MED ORDER — GLYCOPYRROLATE 0.2 MG/ML IJ SOLN
INTRAMUSCULAR | Status: DC | PRN
  Administered 2016-04-22: 0.1 mg via INTRAVENOUS

## 2016-04-22 MED ORDER — NORCO 5-325 MG PO TABS: 1.0000 | ORAL_TABLET | ORAL | Status: DC | PRN

## 2016-04-22 MED ORDER — ACETAMINOPHEN 160 MG/5ML PO SOLN: 325.0000 mg | ORAL | Status: DC | PRN

## 2016-04-22 MED ORDER — BUPIVACAINE HCL (PF) 0.5 % IJ SOLN
INTRAMUSCULAR | Status: DC | PRN
  Administered 2016-04-22: 20 mL

## 2016-04-22 MED ORDER — ONDANSETRON HCL 4 MG/2ML IJ SOLN: 4.0000 mg | Freq: Once | INTRAMUSCULAR | Status: DC | PRN

## 2016-04-22 MED ORDER — FENTANYL CITRATE (PF) 100 MCG/2ML IJ SOLN: 25.0000 ug | INTRAMUSCULAR | Status: DC | PRN

## 2016-04-22 MED ORDER — PROPOFOL 10 MG/ML IV BOLUS
INTRAVENOUS | Status: DC | PRN
  Administered 2016-04-22: 150 mg via INTRAVENOUS

## 2016-04-22 MED ORDER — LACTATED RINGERS IV SOLN
INTRAVENOUS | Status: DC
  Administered 2016-04-22: 08:00:00 via INTRAVENOUS

## 2016-04-22 SURGICAL SUPPLY — 50 items
ARTHROWAND PARAGON T2 (SURGICAL WAND)
BLADE ABRADER 4.5 (BLADE) IMPLANT
BLADE FULL RADIUS 3.5 (BLADE) IMPLANT
BLADE SHAVER 4.5X7 STR FR (MISCELLANEOUS) ×2 IMPLANT
BLADE SHAVER AGGRES 5.5  STR (CUTTER)
BLADE SHAVER AGGRES 5.5 STR (CUTTER) IMPLANT
BNDG ESMARK 6X12 TAN STRL LF (GAUZE/BANDAGES/DRESSINGS) IMPLANT
BUR 5.5 NOTCHBLASTER STR (BURR) IMPLANT
BUR ABRADER 4.0 W/FLUTE AQUA (MISCELLANEOUS) ×1 IMPLANT
BUR ABRADER 5.5 BLK (MISCELLANEOUS) ×1 IMPLANT
BUR ACROMIONIZER 4.0 (BURR) ×2 IMPLANT
BUR BR 5.5 WIDE MOUTH (BURR) IMPLANT
BUR RADIUS 3.5 (BURR) IMPLANT
BUR RADIUS 4.0X18.5 (BURR) IMPLANT
BUR ROUND 5.5 (BURR) IMPLANT
BURR 5.5 NOTCHBLASTER STR (BURR)
BURR ABRADER 4.0 W/FLUTE AQUA (MISCELLANEOUS) ×2
BURR ABRADER 5.5 BLK (MISCELLANEOUS) ×2
BURR ROUND 12 FLUTE 4.0MM (BURR) IMPLANT
CANNULA THRD 8.5X72 DISP (CANNULA) IMPLANT
COVER LIGHT HANDLE UNIVERSAL (MISCELLANEOUS) ×4 IMPLANT
CUFF TOURN SGL QUICK 30 (MISCELLANEOUS)
CUFF TOURN SGL QUICK 34 (TOURNIQUET CUFF)
CUFF TRNQT CYL 34X4X40X1 (TOURNIQUET CUFF) IMPLANT
CUFF TRNQT CYL LO 30X4X (MISCELLANEOUS) IMPLANT
CUTTER SLOTTED WHISKER 4.0 (BURR) IMPLANT
DRAPE LEGGINS SURG 28X43 STRL (DRAPES) ×2 IMPLANT
DURAPREP 26ML APPLICATOR (WOUND CARE) ×2 IMPLANT
GAUZE SPONGE 4X4 12PLY STRL (GAUZE/BANDAGES/DRESSINGS) ×2 IMPLANT
GLOVE BIO SURGEON STRL SZ7.5 (GLOVE) ×2 IMPLANT
GLOVE BIO SURGEON STRL SZ8 (GLOVE) ×2 IMPLANT
GLOVE INDICATOR 8.0 STRL GRN (GLOVE) ×2 IMPLANT
GOWN STRL REIN 2XL XLG LVL4 (GOWN DISPOSABLE) ×4 IMPLANT
GOWN STRL REUS W/TWL 2XL LVL3 (GOWN DISPOSABLE) IMPLANT
IV LACTATED RINGER IRRG 3000ML (IV SOLUTION) ×2
IV LR IRRIG 3000ML ARTHROMATIC (IV SOLUTION) ×2 IMPLANT
KIT ROOM TURNOVER OR (KITS) ×2 IMPLANT
MANIFOLD 4PT FOR NEPTUNE1 (MISCELLANEOUS) ×2 IMPLANT
PACK ARTHROSCOPY KNEE (MISCELLANEOUS) ×2 IMPLANT
SET TUBE SUCT SHAVER OUTFL 24K (TUBING) ×2 IMPLANT
SOL PREP PVP 2OZ (MISCELLANEOUS) ×2
SOLUTION PREP PVP 2OZ (MISCELLANEOUS) ×1 IMPLANT
SUT ETHILON 3-0 FS-10 30 BLK (SUTURE) ×2
SUTURE EHLN 3-0 FS-10 30 BLK (SUTURE) ×1 IMPLANT
TAPE MICROFOAM 4IN (TAPE) ×2 IMPLANT
TUBING ARTHRO INFLOW-ONLY STRL (TUBING) ×2 IMPLANT
WAND ARTHRO PARAGON T2 (SURGICAL WAND) IMPLANT
WAND HAND CNTRL MULTIVAC 50 (MISCELLANEOUS) ×2 IMPLANT
WAND HAND CNTRL MULTIVAC 90 (MISCELLANEOUS) IMPLANT
WAND MEGAVAC 90 (MISCELLANEOUS) IMPLANT

## 2016-04-22 NOTE — Op Note (Signed)
Patient: Julie Mcbride  Preoperative diagnosis: Torn lateral meniscus right knee plus lateral compartment and retropatellar chondral pathology  Postop diagnosis: Same plus chondral lesion lateral tibial plateau and retropatellar fibrillation  Operation: Arthroscopic partial lateral meniscectomy along with abrasion chondroplasty of lateral tibial plateau lesion plus chondroplasty of retropatellar lesion  Surgeon: Vilinda Flake, MD  Anesthesia: Gen.   History: Patient's had a long history of right knee pain.  The plain films revealed minimal lateral compartment narrowing .  The patient had an MRI which revealed torn lateral meniscus plus lateral compartment chondral pathology and retropatellar chondral pathology.The patient was scheduled for surgery due to persistent discomfort despite conservative treatment.  The patient was taken the operating room where satisfactory general anesthesia was achieved. A tourniquet and leg holder were was applied to the right thigh. A well leg support was applied to the nonoperative extremity. The right knee was prepped and draped in usual fashion for an arthroscopic procedure. An inflow cannula was introduced superomedially. The joint was distended with lactated Ringer's. Scope was introduced through an inferolateral puncture wound and a probe through an inferomedial puncture wound. Inspection of the medial compartment revealed  no meniscal or chondral pathology. Inspection of the intercondylar notch revealed intact cruciates. Inspection of the the lateral compartment revealed degenerative tear of the posterior horn of the lateral meniscus. Patient was also noted to have a grade 4 chondral lesion in the posterior aspect of the lateral tibial plateau. There are also some grade 3 chondral changes surrounding the grade 4 chondral lesion. I went ahead and resected the torn elements of the lateral meniscus using a combination of basket biters and a motorized  resector. The remaining rim was contoured with an angled ArthroCare wand. I then used a small round bur to perform an abrasion chondroplasty of the grade 4 lesion in the posterior tibial plateau. The grade 3 changes were debrided with a turbo whisker. Incidentally the grade 4 chondral lesion measured about meters in diameter. Trochlear groove was inspected and appeared to be fairly smooth.  Inspection of the retropatellar surface revealed grade 3 chondral changes. These were debrided with a turbo whisker.. I observed patella tracking from the inferomedial portal. The patella seemed to track fairly well.  The instruments were removed from the joint at this time. The puncture wounds were closed with 3-0 nylon in vertical mattress fashion. I injected each puncture wound with several cc of half percent Marcaine without epinephrine. Betadine was applied the wounds followed by sterile dressing. An ice pack was applied to the right knee. The patient was awakened and transferred to the stretcher bed. The patient was taken to the recovery room in satisfactory condition.  The tourniquet was briefly inflated during the course of the procedure. It was up for about 18 minutes. It was inflated midway through the procedure and then deflated prior to closure of the puncture wounds. Blood loss was negligible.

## 2016-04-22 NOTE — Discharge Instructions (Signed)
General Anesthesia, Adult, Care After Refer to this sheet in the next few weeks. These instructions provide you with information on caring for yourself after your procedure. Your health care provider may also give you more specific instructions. Your treatment has been planned according to current medical practices, but problems sometimes occur. Call your health care provider if you have any problems or questions after your procedure. WHAT TO EXPECT AFTER THE PROCEDURE After the procedure, it is typical to experience:  Sleepiness.  Nausea and vomiting. HOME CARE INSTRUCTIONS  For the first 24 hours after general anesthesia:  Have a responsible person with you.  Do not drive a car. If you are alone, do not take public transportation.  Do not drink alcohol.  Do not take medicine that has not been prescribed by your health care provider.  Do not sign important papers or make important decisions.  You may resume a normal diet and activities as directed by your health care provider.  Change bandages (dressings) as directed.  If you have questions or problems that seem related to general anesthesia, call the hospital and ask for the anesthetist or anesthesiologist on call. SEEK MEDICAL CARE IF:  You have nausea and vomiting that continue the day after anesthesia.  You develop a rash. SEEK IMMEDIATE MEDICAL CARE IF:   You have difficulty breathing.  You have chest pain.  You have any allergic problems.   This information is not intended to replace advice given to you by your health care provider. Make sure you discuss any questions you have with your health care provider.   Document Released: 02/20/2001 Document Revised: 12/05/2014 Document Reviewed: 03/14/2012 Elsevier Interactive Patient Education 2016 [employer]   [facility]  Kathrene Alu., M.D. [phone]   KNEE ARTHROSCOPY POST OPERATION INSTRUCTIONS:  PLEASE  READ THESE INSTRUCTIONS ABOUT POST OPERATION CARE. THEY WILL ANSWER MOST OF YOUR QUESTIONS.  You have been given a prescription for pain. Please take as directed for pain.  You can walk, keeping the knee slightly stiff-avoid doing too much bending the first day. (if ACL reconstruction is performed, keep brace locked in extension when walking.)  You will use crutches or cane if needed. Can weight bear as tolerated  Plan to take three to four days off from work. You can resume work when you are comfortable. (This can be a week or more, depending on the type of work you do.)  To reduce pain and swelling, place one to two pillows under the knee the first two or three days when sitting or lying. An ice pack may be placed on top of the area over the dressing. Instructions for making homemade icepack are as follow:  Flexible homemade alcohol water ice pack  2 cups water  1 cup rubbing alcohol  food coloring for the blue tint (optional)  2 zip-top bags - gallon-size  Mix the water and alcohol together in one of your zip-top bags and add food coloring. Release as much air as possible and seal the bag. Place in freezer for at least 12 hours.  The small incisions in your knee are closed with nylon stitches. They will be removed in the office.  The bulky dressing may be removed in the third day after surgery. (If ACL surgery-DO NOT REMOVE BANDAGES). Put a waterproof band-aid over each stitch. Do not put any creams or ointments on wounds. You may shower at this time, but change waterproof band-aids after showering. [place]  AND DRY UNTIL YOU RETURN TO THE OFFICE.  Sometimes the operative area remains somewhat painful and swollen for several weeks. This is usually nothing to worry about, but call if you have any excessive symptoms, especially fever. It is not unusual to have a low grade fever of 99 degrees for the first few days. If persist after 3-4 days call the office. It is not uncommon for the pain  to be a little worse on the third day after surgery.  Begin doing gentle exercises right away. They will be limited by the amount of pain and swelling you have.  Exercising will reduce the swelling, increase motion, and prevent muscle weakness. Exercises: Straight leg raising and gentle knee bending.  Take 81 milligram aspirin twice a day for 2 weeks after meals or milk. This along with elevation will help reduce the possibility of phlebitis in your operated leg.  Avoid strenuous athletics for a minimum of 4 to 6 weeks after arthroscopic surgery (approximately five months if ACL surgery).  If the surgery included ACL reconstruction the brace that is supplied to the extremity post surgery is to be locked in extension when you are asleep and is to be locked in extension when you are ambulating. It can be unlocked for exercises or sitting.  Keep your post surgery appointment that has been made for you. If you do not remember the date call [phone]. Your follow up appointment should be between 7-10 days.

## 2016-04-22 NOTE — Transfer of Care (Signed)
Immediate Anesthesia Transfer of Care Note  Patient: Julie Mcbride  Procedure(s) Performed: Procedure(s): ARTHROSCOPY KNEE (Right)  Patient Location: PACU  Anesthesia Type: General LMA  Level of Consciousness: awake, alert  and patient cooperative  Airway and Oxygen Therapy: Patient Spontanous Breathing and Patient connected to supplemental oxygen  Post-op Assessment: Post-op Vital signs reviewed, Patient's Cardiovascular Status Stable, Respiratory Function Stable, Patent Airway and No signs of Nausea or vomiting  Post-op Vital Signs: Reviewed and stable  Complications: No apparent anesthesia complications

## 2016-04-22 NOTE — H&P (Signed)
  H and P reviewed. No changes. Uploaded at later date. 

## 2016-04-22 NOTE — Anesthesia Postprocedure Evaluation (Signed)
Anesthesia Post Note  Patient: Julie Mcbride  Procedure(s) Performed: Procedure(s) (LRB): ARTHROSCOPY KNEE (Right)  Patient location during evaluation: PACU Anesthesia Type: General Level of consciousness: awake and alert Pain management: pain level controlled Vital Signs Assessment: post-procedure vital signs reviewed and stable Respiratory status: spontaneous breathing, nonlabored ventilation, respiratory function stable and patient connected to nasal cannula oxygen Cardiovascular status: blood pressure returned to baseline and stable Postop Assessment: no signs of nausea or vomiting Anesthetic complications: no    Amaryllis Dyke

## 2016-04-22 NOTE — Anesthesia Procedure Notes (Signed)
Procedure Name: LMA Insertion Date/Time: 04/22/2016 9:26 AM Performed by: Mayme Genta Pre-anesthesia Checklist: Patient identified, Emergency Drugs available, Suction available, Timeout performed and Patient being monitored Patient Re-evaluated:Patient Re-evaluated prior to inductionOxygen Delivery Method: Circle system utilized Preoxygenation: Pre-oxygenation with 100% oxygen Intubation Type: IV induction LMA: LMA inserted LMA Size: 4.0 Number of attempts: 1 Placement Confirmation: positive ETCO2 and breath sounds checked- equal and bilateral Tube secured with: Tape

## 2016-04-22 NOTE — Anesthesia Preprocedure Evaluation (Signed)
Anesthesia Evaluation  Patient identified by MRN, date of birth, ID band Patient awake    Reviewed: Allergy & Precautions, H&P , NPO status , Patient's Chart, lab work & pertinent test results  Airway Mallampati: II  TM Distance: >3 FB Neck ROM: full    Dental   Pulmonary shortness of breath,    breath sounds clear to auscultation       Cardiovascular hypertension, Normal cardiovascular exam  Normal sinus rhythm Left axis deviation Incomplete right bundle branch block Left ventricular hypertrophy with repolarization abnormality Cannot rule out Septal infarct (cited on or before 13-Jan-2010) Abnormal ECG   Neuro/Psych    GI/Hepatic GERD  ,  Endo/Other    Renal/GU      Musculoskeletal   Abdominal   Peds  Hematology   Anesthesia Other Findings   Reproductive/Obstetrics                             Anesthesia Physical Anesthesia Plan  ASA: II  Anesthesia Plan: General LMA   Post-op Pain Management:    Induction:   Airway Management Planned:   Additional Equipment:   Intra-op Plan:   Post-operative Plan:   Informed Consent: I have reviewed the patients History and Physical, chart, labs and discussed the procedure including the risks, benefits and alternatives for the proposed anesthesia with the patient or authorized representative who has indicated his/her understanding and acceptance.     Plan Discussed with: CRNA  Anesthesia Plan Comments:         Anesthesia Quick Evaluation

## 2016-04-25 ENCOUNTER — Encounter: Payer: Self-pay | Admitting: Unknown Physician Specialty

## 2016-04-26 NOTE — OR Nursing (Signed)
Procedure-ARTHROSCOP[IC PARTIAL LATERAL MENISCECTOMY ALONG WITH ABRASION CHRONDOPLASTY OF LATERAL TIBIAL PLATEAU LESION PLUS CHONDROPLASTY OF RETROPATELLAR LESION

## 2016-05-06 ENCOUNTER — Other Ambulatory Visit: Payer: Self-pay | Admitting: Family Medicine

## 2016-05-06 DIAGNOSIS — Z1231 Encounter for screening mammogram for malignant neoplasm of breast: Secondary | ICD-10-CM

## 2016-05-24 ENCOUNTER — Ambulatory Visit
Admission: RE | Admit: 2016-05-24 | Discharge: 2016-05-24 | Disposition: A | Discharge status: Home / Self Care | Payer: Medicare Other | Source: Ambulatory Visit | Attending: Family Medicine | Admitting: Family Medicine

## 2016-05-24 ENCOUNTER — Other Ambulatory Visit: Payer: Self-pay | Admitting: Family Medicine

## 2016-05-24 DIAGNOSIS — Z1231 Encounter for screening mammogram for malignant neoplasm of breast: Secondary | ICD-10-CM | POA: Insufficient documentation

## 2017-01-11 HISTORY — PX: OTHER SURGICAL HISTORY: SHX169

## 2017-04-26 ENCOUNTER — Other Ambulatory Visit: Payer: Self-pay | Admitting: Family Medicine

## 2017-04-26 DIAGNOSIS — Z1231 Encounter for screening mammogram for malignant neoplasm of breast: Secondary | ICD-10-CM

## 2017-05-26 ENCOUNTER — Ambulatory Visit
Admission: RE | Admit: 2017-05-26 | Discharge: 2017-05-26 | Disposition: A | Discharge status: Home / Self Care | Payer: Medicare Other | Source: Ambulatory Visit | Attending: Family Medicine | Admitting: Family Medicine

## 2017-05-26 DIAGNOSIS — R928 Other abnormal and inconclusive findings on diagnostic imaging of breast: Secondary | ICD-10-CM | POA: Insufficient documentation

## 2017-05-26 DIAGNOSIS — Z1231 Encounter for screening mammogram for malignant neoplasm of breast: Secondary | ICD-10-CM | POA: Diagnosis not present

## 2017-05-30 ENCOUNTER — Other Ambulatory Visit: Payer: Self-pay | Admitting: Family Medicine

## 2017-05-30 DIAGNOSIS — R928 Other abnormal and inconclusive findings on diagnostic imaging of breast: Secondary | ICD-10-CM

## 2017-06-08 ENCOUNTER — Ambulatory Visit
Admission: RE | Admit: 2017-06-08 | Discharge: 2017-06-08 | Disposition: A | Discharge status: Home / Self Care | Payer: Medicare Other | Source: Ambulatory Visit | Attending: Family Medicine | Admitting: Family Medicine

## 2017-06-08 DIAGNOSIS — R928 Other abnormal and inconclusive findings on diagnostic imaging of breast: Secondary | ICD-10-CM

## 2017-06-08 DIAGNOSIS — N6489 Other specified disorders of breast: Secondary | ICD-10-CM | POA: Insufficient documentation

## 2017-06-12 ENCOUNTER — Other Ambulatory Visit: Payer: Self-pay | Admitting: Family Medicine

## 2017-06-12 DIAGNOSIS — R928 Other abnormal and inconclusive findings on diagnostic imaging of breast: Secondary | ICD-10-CM

## 2017-06-19 ENCOUNTER — Ambulatory Visit
Admission: RE | Admit: 2017-06-19 | Discharge: 2017-06-19 | Disposition: A | Discharge status: Home / Self Care | Payer: Medicare Other | Source: Ambulatory Visit | Attending: Family Medicine | Admitting: Family Medicine

## 2017-06-19 DIAGNOSIS — R928 Other abnormal and inconclusive findings on diagnostic imaging of breast: Secondary | ICD-10-CM

## 2017-06-19 DIAGNOSIS — N62 Hypertrophy of breast: Secondary | ICD-10-CM | POA: Diagnosis not present

## 2017-06-19 HISTORY — PX: BREAST BIOPSY: SHX20

## 2017-06-20 LAB — SURGICAL PATHOLOGY

## 2017-07-20 ENCOUNTER — Other Ambulatory Visit: Payer: Self-pay | Admitting: Surgery

## 2017-07-20 DIAGNOSIS — D0472 Carcinoma in situ of skin of left lower limb, including hip: Secondary | ICD-10-CM

## 2017-07-20 DIAGNOSIS — N63 Unspecified lump in unspecified breast: Secondary | ICD-10-CM

## 2017-07-20 HISTORY — DX: Carcinoma in situ of skin of left lower limb, including hip: D04.72

## 2017-08-04 ENCOUNTER — Encounter
Admission: RE | Admit: 2017-08-04 | Discharge: 2017-08-04 | Disposition: A | Discharge status: Home / Self Care | Payer: Medicare Other | Source: Ambulatory Visit | Attending: Surgery | Admitting: Surgery

## 2017-08-04 DIAGNOSIS — R9431 Abnormal electrocardiogram [ECG] [EKG]: Secondary | ICD-10-CM | POA: Diagnosis not present

## 2017-08-04 DIAGNOSIS — Z01818 Encounter for other preprocedural examination: Secondary | ICD-10-CM | POA: Diagnosis not present

## 2017-08-04 DIAGNOSIS — I10 Essential (primary) hypertension: Secondary | ICD-10-CM | POA: Diagnosis not present

## 2017-08-04 HISTORY — DX: Carcinoma in situ of skin of left lower limb, including hip: D04.72

## 2017-08-04 LAB — BASIC METABOLIC PANEL
Anion gap: 7 (ref 5–15)
BUN: 21 mg/dL — AB (ref 6–20)
CALCIUM: 9.2 mg/dL (ref 8.9–10.3)
CO2: 30 mmol/L (ref 22–32)
CREATININE: 0.92 mg/dL (ref 0.44–1.00)
Chloride: 103 mmol/L (ref 101–111)
GFR calc non Af Amer: 58 mL/min — ABNORMAL LOW (ref >60)
Glucose, Bld: 81 mg/dL (ref 65–99)
Potassium: 3.7 mmol/L (ref 3.5–5.1)
Sodium: 140 mmol/L (ref 135–145)

## 2017-08-04 LAB — CBC
HCT: 40.1 % (ref 35.0–47.0)
Hemoglobin: 13.4 g/dL (ref 12.0–16.0)
MCH: 30.3 pg (ref 26.0–34.0)
MCHC: 33.4 g/dL (ref 32.0–36.0)
MCV: 90.6 fL (ref 80.0–100.0)
PLATELETS: 181 10*3/uL (ref 150–440)
RBC: 4.42 MIL/uL (ref 3.80–5.20)
RDW: 14.4 % (ref 11.5–14.5)
WBC: 7.1 10*3/uL (ref 3.6–11.0)

## 2017-08-04 NOTE — Patient Instructions (Signed)
Your procedure is scheduled on: Friday, August 11, 2017 Report to Same Day Surgery on the 2nd floor in the [facility]. To find out your arrival time, please call [phone] between 1PM - 3PM on: [place]: Instructions that are not followed completely may result in serious medical risk up to and including death; or upon the discretion of your surgeon and anesthesiologist your surgery may need to be rescheduled.  Do not eat food or drink liquids after midnight. No gum chewing or hard candies.  No Alcohol for 24 hours before or after surgery.  No Smoking for 24 hours prior to surgery.  Notify your doctor if there is any change in your medical condition (cold, fever, infection).  Do not wear jewelry, make-up, hairpins, clips or nail polish.  Do not wear lotions, powders, or perfumes.   Do not shave 48 hours prior to surgery.   Contacts and dentures may not be worn into surgery.  Do not bring valuables to the hospital. [facility] is not responsible for any belongings or valuables.  TAKE THESE MEDICATIONS THE MORNING OF SURGERY WITH A SIP OF WATER:  1.  HYDRALAZINE 2.  ZANTAC (take one tablet the night before surgery and one tablet the morning of surgery) - helps to prevent nausea after surgery  Stop Anti-inflammatories such as Advil, Aleve, Ibuprofen, Motrin, Naproxen, Naprosyn, Goodie powder, or aspirin products. (May take Tylenol if needed.)  Stop OVER THE COUNTER supplements (VITAMIN C) until after surgery. (May continue Vitamin D and multivitamin.)  If you are being discharged the day of surgery, you will not be allowed to drive home. You will need someone to drive you home and stay with you that night.   If you are taking public transportation, you will need to have a responsible adult to with you.  Please call the number above if you have any questions about these instructions.

## 2017-08-07 NOTE — Pre-Procedure Instructions (Signed)
EKG COMPARED WITH OLD 

## 2017-08-11 ENCOUNTER — Encounter: Payer: Self-pay | Admitting: *Deleted

## 2017-08-11 ENCOUNTER — Encounter
Admission: RE | Disposition: A | Discharge status: Home / Self Care | Payer: Self-pay | Source: Ambulatory Visit | Attending: Surgery

## 2017-08-11 ENCOUNTER — Ambulatory Visit
Admission: RE | Admit: 2017-08-11 | Discharge: 2017-08-11 | Disposition: A | Discharge status: Home / Self Care | Payer: Medicare Other | Source: Ambulatory Visit | Attending: Surgery | Admitting: Surgery

## 2017-08-11 ENCOUNTER — Ambulatory Visit: Payer: Medicare Other | Admitting: Registered Nurse

## 2017-08-11 ENCOUNTER — Encounter: Payer: Self-pay | Admitting: Anesthesiology

## 2017-08-11 DIAGNOSIS — N6489 Other specified disorders of breast: Secondary | ICD-10-CM | POA: Diagnosis not present

## 2017-08-11 DIAGNOSIS — N63 Unspecified lump in unspecified breast: Secondary | ICD-10-CM

## 2017-08-11 DIAGNOSIS — K219 Gastro-esophageal reflux disease without esophagitis: Secondary | ICD-10-CM | POA: Insufficient documentation

## 2017-08-11 DIAGNOSIS — Z7982 Long term (current) use of aspirin: Secondary | ICD-10-CM | POA: Diagnosis not present

## 2017-08-11 DIAGNOSIS — Z96651 Presence of right artificial knee joint: Secondary | ICD-10-CM | POA: Insufficient documentation

## 2017-08-11 DIAGNOSIS — I119 Hypertensive heart disease without heart failure: Secondary | ICD-10-CM | POA: Diagnosis not present

## 2017-08-11 DIAGNOSIS — Z79899 Other long term (current) drug therapy: Secondary | ICD-10-CM | POA: Insufficient documentation

## 2017-08-11 DIAGNOSIS — I4510 Unspecified right bundle-branch block: Secondary | ICD-10-CM | POA: Diagnosis not present

## 2017-08-11 DIAGNOSIS — Z85828 Personal history of other malignant neoplasm of skin: Secondary | ICD-10-CM | POA: Insufficient documentation

## 2017-08-11 DIAGNOSIS — N6310 Unspecified lump in the right breast, unspecified quadrant: Secondary | ICD-10-CM | POA: Diagnosis present

## 2017-08-11 DIAGNOSIS — E559 Vitamin D deficiency, unspecified: Secondary | ICD-10-CM | POA: Diagnosis not present

## 2017-08-11 HISTORY — PX: EXCISION OF BREAST BIOPSY: SHX5822

## 2017-08-11 HISTORY — PX: BREAST LUMPECTOMY WITH NEEDLE LOCALIZATION: SHX5759

## 2017-08-11 SURGERY — BREAST LUMPECTOMY WITH NEEDLE LOCALIZATION
Anesthesia: General | Site: Breast | Laterality: Right | Wound class: Clean

## 2017-08-11 MED ORDER — ONDANSETRON HCL 4 MG/2ML IJ SOLN
INTRAMUSCULAR | Status: AC
  Filled 2017-08-11: qty 2

## 2017-08-11 MED ORDER — ACETAMINOPHEN 10 MG/ML IV SOLN
INTRAVENOUS | Status: AC
  Filled 2017-08-11: qty 100

## 2017-08-11 MED ORDER — TRAMADOL HCL 50 MG PO TABS
ORAL_TABLET | ORAL | Status: AC
  Filled 2017-08-11: qty 1

## 2017-08-11 MED ORDER — BUPIVACAINE-EPINEPHRINE (PF) 0.5% -1:200000 IJ SOLN
INTRAMUSCULAR | Status: AC
  Filled 2017-08-11: qty 30

## 2017-08-11 MED ORDER — FENTANYL CITRATE (PF) 100 MCG/2ML IJ SOLN
INTRAMUSCULAR | Status: AC
  Filled 2017-08-11: qty 2

## 2017-08-11 MED ORDER — PROPOFOL 10 MG/ML IV BOLUS
INTRAVENOUS | Status: DC | PRN
  Administered 2017-08-11: 50 mg via INTRAVENOUS
  Administered 2017-08-11: 150 mg via INTRAVENOUS

## 2017-08-11 MED ORDER — PROPOFOL 10 MG/ML IV BOLUS
INTRAVENOUS | Status: AC
  Filled 2017-08-11: qty 20

## 2017-08-11 MED ORDER — LIDOCAINE HCL (PF) 2 % IJ SOLN
INTRAMUSCULAR | Status: AC
  Filled 2017-08-11: qty 2

## 2017-08-11 MED ORDER — BUPIVACAINE-EPINEPHRINE 0.5% -1:200000 IJ SOLN
INTRAMUSCULAR | Status: DC | PRN
  Administered 2017-08-11: 8 mL

## 2017-08-11 MED ORDER — FENTANYL CITRATE (PF) 100 MCG/2ML IJ SOLN
INTRAMUSCULAR | Status: DC | PRN
  Administered 2017-08-11 (×2): 50 ug via INTRAVENOUS

## 2017-08-11 MED ORDER — ONDANSETRON HCL 4 MG/2ML IJ SOLN
INTRAMUSCULAR | Status: DC | PRN
  Administered 2017-08-11: 4 mg via INTRAVENOUS

## 2017-08-11 MED ORDER — MIDAZOLAM HCL 2 MG/2ML IJ SOLN
INTRAMUSCULAR | Status: DC | PRN
  Administered 2017-08-11: 2 mg via INTRAVENOUS

## 2017-08-11 MED ORDER — FENTANYL CITRATE (PF) 100 MCG/2ML IJ SOLN: 25.0000 ug | INTRAMUSCULAR | Status: DC | PRN

## 2017-08-11 MED ORDER — TRAMADOL HCL 50 MG PO TABS: 50.0000 mg | ORAL_TABLET | Freq: Four times a day (QID) | ORAL | 1 refills | Status: AC

## 2017-08-11 MED ORDER — LIDOCAINE HCL (CARDIAC) 20 MG/ML IV SOLN
INTRAVENOUS | Status: DC | PRN
  Administered 2017-08-11: 50 mg via INTRAVENOUS

## 2017-08-11 MED ORDER — ONDANSETRON HCL 4 MG/2ML IJ SOLN: 4.0000 mg | Freq: Once | INTRAMUSCULAR | Status: DC | PRN

## 2017-08-11 MED ORDER — DEXAMETHASONE SODIUM PHOSPHATE 10 MG/ML IJ SOLN
INTRAMUSCULAR | Status: AC
  Filled 2017-08-11: qty 1

## 2017-08-11 MED ORDER — DEXAMETHASONE SODIUM PHOSPHATE 10 MG/ML IJ SOLN
INTRAMUSCULAR | Status: DC | PRN
  Administered 2017-08-11: 5 mg via INTRAVENOUS

## 2017-08-11 MED ORDER — KETOROLAC TROMETHAMINE 30 MG/ML IJ SOLN
INTRAMUSCULAR | Status: AC
  Filled 2017-08-11: qty 1

## 2017-08-11 MED ORDER — TRAMADOL HCL 50 MG PO TABS
50.0000 mg | ORAL_TABLET | ORAL | Status: DC | PRN
  Administered 2017-08-11: 50 mg via ORAL

## 2017-08-11 MED ORDER — LACTATED RINGERS IV SOLN
INTRAVENOUS | Status: DC
  Administered 2017-08-11 (×2): via INTRAVENOUS

## 2017-08-11 MED ORDER — MIDAZOLAM HCL 2 MG/2ML IJ SOLN
INTRAMUSCULAR | Status: AC
  Filled 2017-08-11: qty 2

## 2017-08-11 MED ORDER — GLYCOPYRROLATE 0.2 MG/ML IJ SOLN
INTRAMUSCULAR | Status: AC
  Filled 2017-08-11: qty 1

## 2017-08-11 MED ORDER — GLYCOPYRROLATE 0.2 MG/ML IJ SOLN
INTRAMUSCULAR | Status: DC | PRN
  Administered 2017-08-11: 0.2 mg via INTRAVENOUS

## 2017-08-11 SURGICAL SUPPLY — 27 items

## 2017-08-11 NOTE — Anesthesia Postprocedure Evaluation (Signed)
Anesthesia Post Note  Patient: Julie Mcbride  Procedure(s) Performed: Procedure(s) (LRB): BREAST LUMPECTOMY WITH NEEDLE LOCALIZATION (Right)  Patient location during evaluation: PACU Anesthesia Type: General Level of consciousness: awake and alert and oriented Pain management: pain level controlled Vital Signs Assessment: post-procedure vital signs reviewed and stable Respiratory status: spontaneous breathing Cardiovascular status: blood pressure returned to baseline Anesthetic complications: no     Last Vitals:  Vitals:   08/11/17 1400 08/11/17 1434  BP: (!) 166/79 (!) 173/73  Pulse: 68 65  Resp: 18 18  Temp: (!) 35.9 C 36.4 C  SpO2: 95% 98%    Last Pain:  Vitals:   08/11/17 1434  TempSrc: Temporal  PainSc: 5                  Dontarius Sheley

## 2017-08-11 NOTE — Anesthesia Post-op Follow-up Note (Signed)
Anesthesia QCDR form completed.        

## 2017-08-11 NOTE — Discharge Instructions (Addendum)
Take Tylenol and / or tramadol as needed fr pain.  Should not drive or do anything dangerous when taking tramadol.  May shower and blot dry.  Wear bra as desired for comfort and support.     AMBULATORY SURGERY  DISCHARGE INSTRUCTIONS   1) The drugs that you were given will stay in your system until tomorrow so for the next 24 hours you should not:  A) Drive an automobile B) Make any legal decisions C) Drink any alcoholic beverage   2) You may resume regular meals tomorrow.  Today it is better to start with liquids and gradually work up to solid foods.  You may eat anything you prefer, but it is better to start with liquids, then soup and crackers, and gradually work up to solid foods.   3) Please notify your doctor immediately if you have any unusual bleeding, trouble breathing, redness and pain at the surgery site, drainage, fever, or pain not relieved by medication.    4) Additional Instructions:        Please contact your physician with any problems or Same Day Surgery at [phone], Monday through Friday 6 am to 4 pm, or [facility] number at [phone].

## 2017-08-11 NOTE — Anesthesia Procedure Notes (Signed)
Procedure Name: LMA Insertion Performed by: Onur Mori Pre-anesthesia Checklist: Patient identified, Patient being monitored, Timeout performed, Emergency Drugs available and Suction available Patient Re-evaluated:Patient Re-evaluated prior to induction Oxygen Delivery Method: Circle system utilized Preoxygenation: Pre-oxygenation with 100% oxygen Induction Type: IV induction Ventilation: Mask ventilation without difficulty LMA: LMA inserted LMA Size: 3.5 Tube type: Oral Number of attempts: 1 Placement Confirmation: positive ETCO2 and breath sounds checked- equal and bilateral Tube secured with: Tape Dental Injury: Teeth and Oropharynx as per pre-operative assessment        

## 2017-08-11 NOTE — Anesthesia Preprocedure Evaluation (Signed)
Anesthesia Evaluation  Patient identified by MRN, date of birth, ID band Patient awake    Reviewed: Allergy & Precautions, H&P , NPO status , Patient's Chart, lab work & pertinent test results, reviewed documented beta blocker date and time   Airway Mallampati: II  TM Distance: >3 FB Neck ROM: full    Dental  (+) Teeth Intact   Pulmonary shortness of breath and with exertion,    Pulmonary exam normal breath sounds clear to auscultation       Cardiovascular hypertension, Pt. on home beta blockers and Pt. on medications Normal cardiovascular exam+ dysrhythmias   Normal sinus rhythm Left axis deviation Incomplete right bundle branch block Left ventricular hypertrophy with repolarization abnormality Cannot rule out Septal infarct (cited on or before 13-Jan-2010) Abnormal ECG   Neuro/Psych negative neurological ROS  negative psych ROS   GI/Hepatic Neg liver ROS, GERD  Controlled and Medicated,  Endo/Other  negative endocrine ROS  Renal/GU stones  negative genitourinary   Musculoskeletal  (+) Arthritis , Osteoarthritis,    Abdominal Normal abdominal exam  (+)   Peds negative pediatric ROS (+)  Hematology negative hematology ROS (+)   Anesthesia Other Findings Past Medical History: No date: Arthritis     Comment:  Knees, hands No date: Basal cell carcinoma     Comment:  back - removed No date: Bladder polyp     Comment:  With previos hematuria No date: Diverticulitis No date: Dyspnea on exertion     Comment:  Chronic No date: GERD (gastroesophageal reflux disease) No date: History of ETT     Comment:  Myoview 7/10 EF 78%, no ischemia or infarction. Poor               exercise tolerance (2'30", stopped due to fatigue).               Reached 86% MPHR. 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  Reproductive/Obstetrics                             Anesthesia Physical  Anesthesia Plan  ASA: II  Anesthesia Plan: General LMA and General   Post-op Pain Management:    Induction:   PONV Risk Score and Plan: 3 and Ondansetron, Dexamethasone, Midazolam and Propofol infusion  Airway Management Planned: LMA  Additional Equipment:   Intra-op Plan:   Post-operative Plan: Extubation in OR  Informed Consent: I have reviewed the patients History and Physical, chart, labs and discussed the procedure including the risks, benefits and alternatives for the proposed anesthesia with the patient or authorized representative who has indicated his/her understanding and acceptance.   Dental advisory given  Plan Discussed with: CRNA and Surgeon  Anesthesia Plan Comments:         Anesthesia Quick Evaluation

## 2017-08-11 NOTE — Op Note (Signed)
OPERATIVE REPORT  PREOPERATIVE  DIAGNOSIS: .Marland Kitchen Right breast mass  POSTOPERATIVE DIAGNOSIS: .Marland Kitchen Right breast mass  PROCEDURE: .Marland Kitchen Excision right breast mass  ANESTHESIA:  General  SURGEON: Rochel Brome  MD   INDICATIONS: .Marland Kitchen She recently had needle biopsy findings of radial scar of the upper aspect of the right breast. Excision was recommended for further evaluation and treatment.  With the patient on the operating table in the supine position she was placed under general anesthesia. The dressing was removed from the right breast exposing the Kopan's wire which entered the medial aspect of the breast. The wire was cut 2 cm from the skin. The breast and surrounding chest wall were prepared with Betadine and draped in a sterile manner.Mammogram images were reviewed prior to incision seeing the proximity of the Kopan's wire to the biopsy marker.a curvilinear incision was made in the upper aspect of the breast 5.5 cm from the nipple. Dissection was carried down through subcutaneous tissues deeply within the breast to encounter the Kopan's wire. A portion of tissue surrounding the thick portion of the wire was excised. There was some palpable firmness issue. The specimen approximately 2 x 2 x 3 cm in size was marked with margin maps to mark the medial lateral cranial caudal superficial and deep margins. This was submitted for specimen mammogram. The specimen mammogram demonstrated the biopsy site and the biopsy marker. This has been submitted for routine pathology. The wound was inspected and several small bleeding points were cauterized. Hemostasis was subsequently intact. Subcuticular tissues were infiltrated with half percent Sensorcaine with epinephrine. The wound was closed with a running 4-0 Monocryl subcuticular suture and Dermabond  The patient tolerated surgery satisfactorily and was then prepared for transfer to the recovery room  [employer].D.

## 2017-08-11 NOTE — OR Nursing (Signed)
Dr. Tamala Julian in to see pt - advises ok to d/c to home

## 2017-08-11 NOTE — Transfer of Care (Signed)
Immediate Anesthesia Transfer of Care Note  Patient: Julie Mcbride  Procedure(s) Performed: Procedure(s): BREAST LUMPECTOMY WITH NEEDLE LOCALIZATION (Right)  Patient Location: PACU  Anesthesia Type:General  Level of Consciousness: sedated  Airway & Oxygen Therapy: Patient Spontanous Breathing and Patient connected to face mask oxygen  Post-op Assessment: Report given to RN and Post -op Vital signs reviewed and stable  Post vital signs: Reviewed and stable  Last Vitals:  Vitals:   08/11/17 0947 08/11/17 1316  BP: (!) 169/71 (!) 150/69  Pulse: 63 67  Resp: 18 13  Temp: (!) 35.6 C 36.6 C  SpO2:  09%    Complications: No apparent anesthesia complications

## 2017-08-11 NOTE — H&P (Signed)
  She comes in today prepared for excision of right breast mass. She recently had needle biopsy findings of radial scar of the right breast. This morning she had insertion of Kopan's wire. I have reviewed the x-ray films demonstrating the proximity of the wire to the biopsy marker.  Lab work reviewed.  The right side was marked YES.  I discussed the plan for surgery

## 2017-08-15 LAB — SURGICAL PATHOLOGY

## 2018-05-11 ENCOUNTER — Other Ambulatory Visit: Payer: Self-pay | Admitting: Family Medicine

## 2018-05-11 DIAGNOSIS — Z1231 Encounter for screening mammogram for malignant neoplasm of breast: Secondary | ICD-10-CM

## 2018-06-04 ENCOUNTER — Ambulatory Visit
Admission: RE | Admit: 2018-06-04 | Discharge: 2018-06-04 | Disposition: A | Discharge status: Home / Self Care | Payer: Medicare Other | Source: Ambulatory Visit | Attending: Family Medicine | Admitting: Family Medicine

## 2018-06-04 DIAGNOSIS — Z1231 Encounter for screening mammogram for malignant neoplasm of breast: Secondary | ICD-10-CM | POA: Insufficient documentation

## 2018-11-07 IMAGING — MG MM DIGITAL SCREENING BILAT W/ TOMO W/ CAD
8 series · 8 of 24 positions shown · non-contrast
Comparison: Previous exam(s).

CLINICAL DATA: Screening.

EXAM:
DIGITAL SCREENING BILATERAL MAMMOGRAM WITH TOMO AND CAD

[R CC synth-2D]
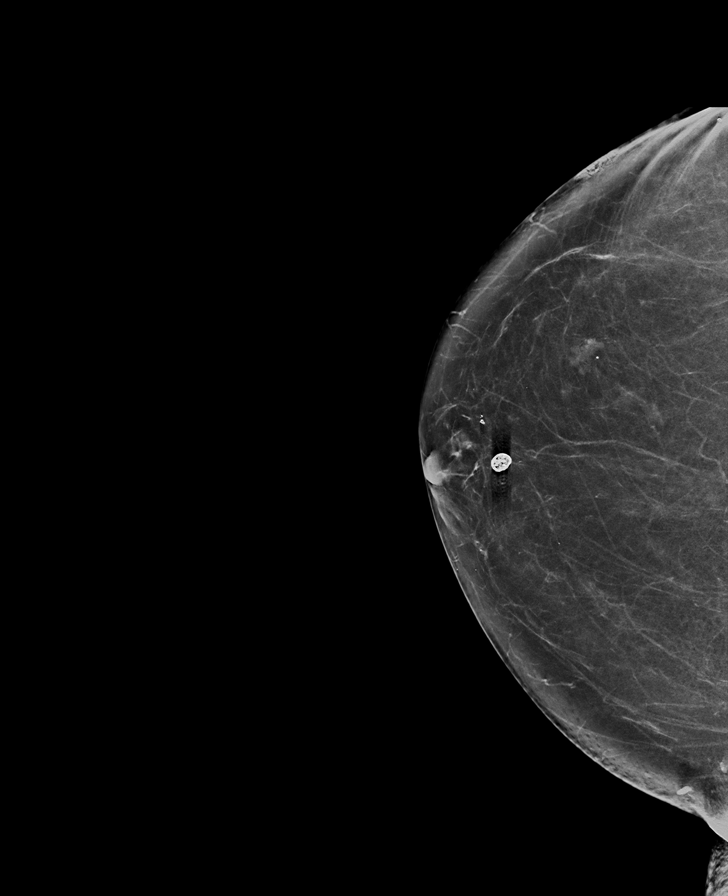

[R MLO synth-2D]
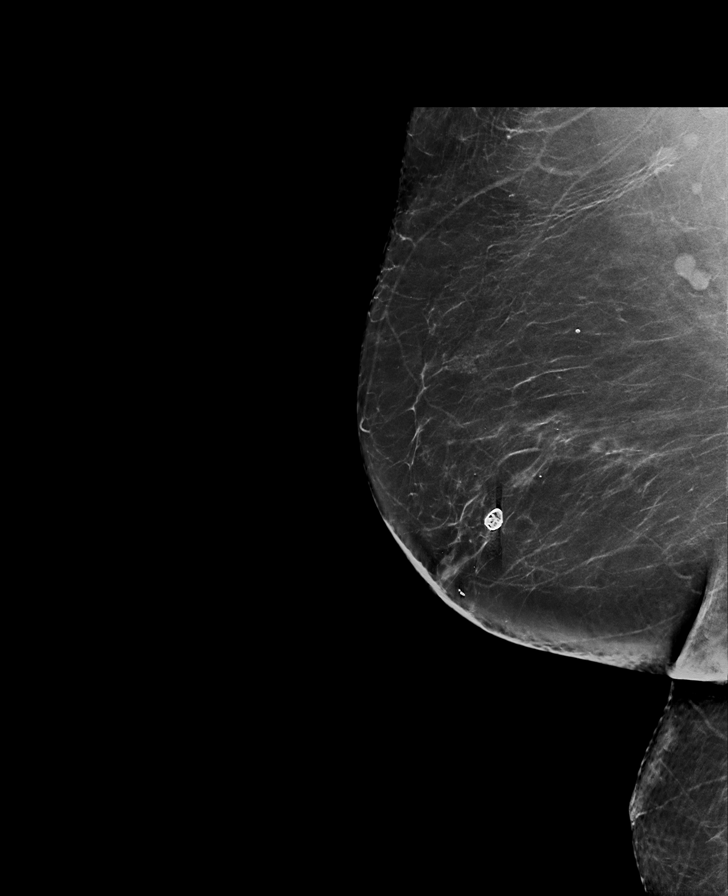

[L CC synth-2D]
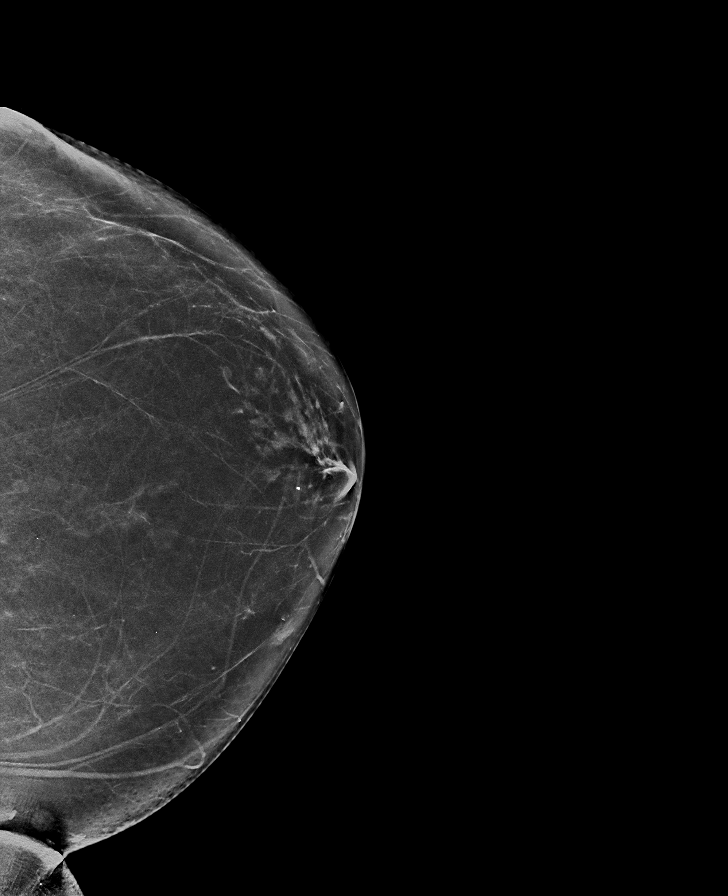

[L MLO synth-2D]
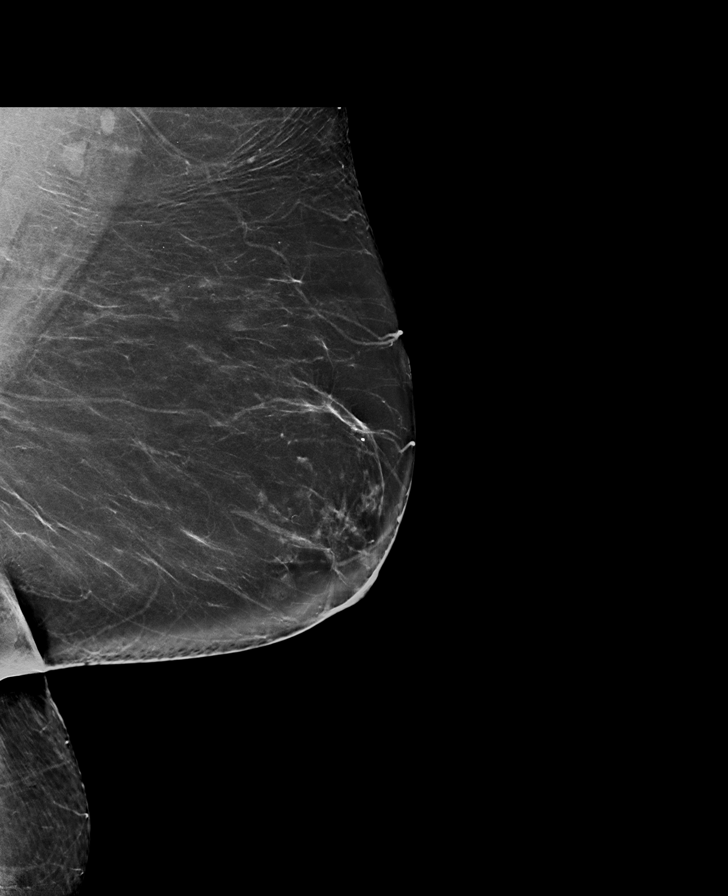

[L MLO tomo · tomo slice 42/83.0]
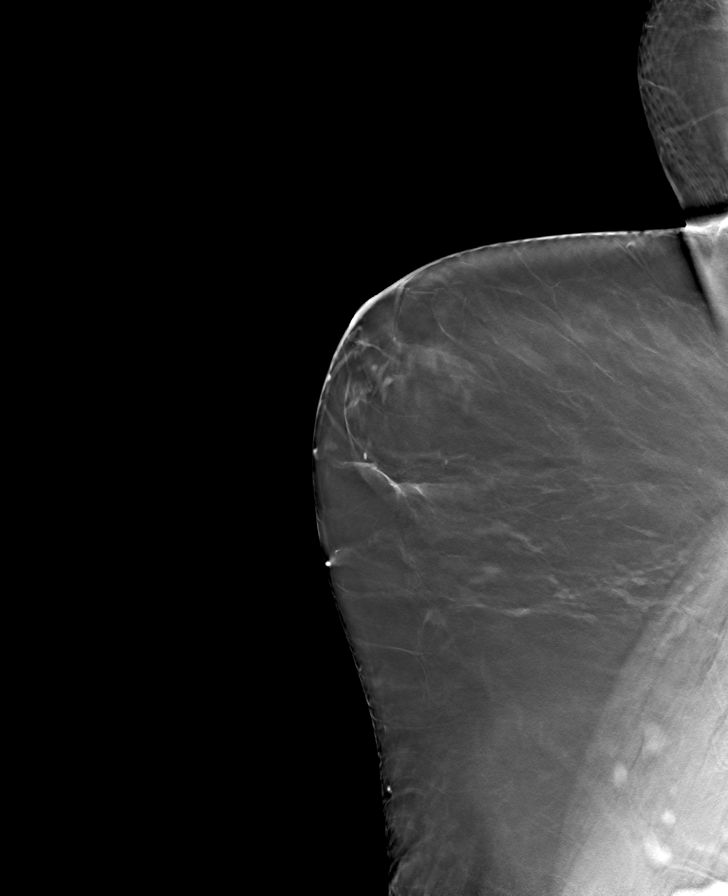

[L CC tomo · tomo slice 37/74.0]
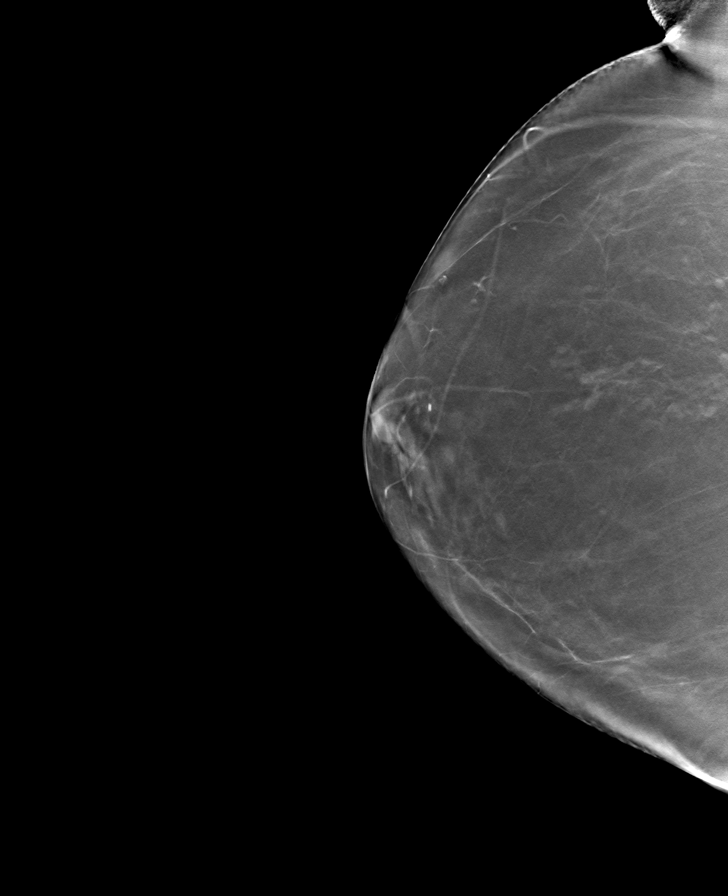

[R MLO tomo · tomo slice 41/82.0]
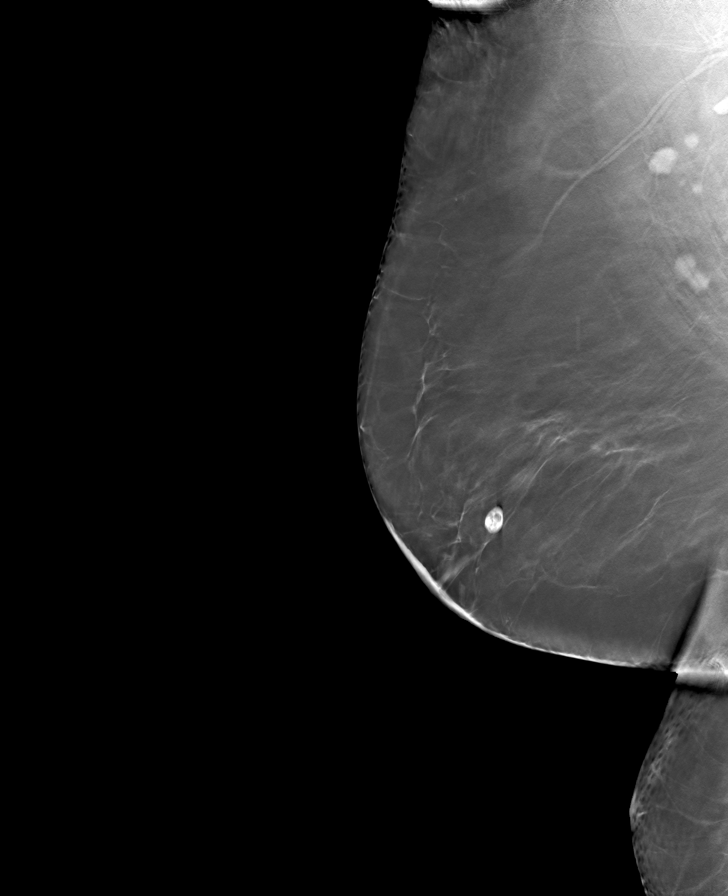

[R CC tomo · tomo slice 37/73.0]
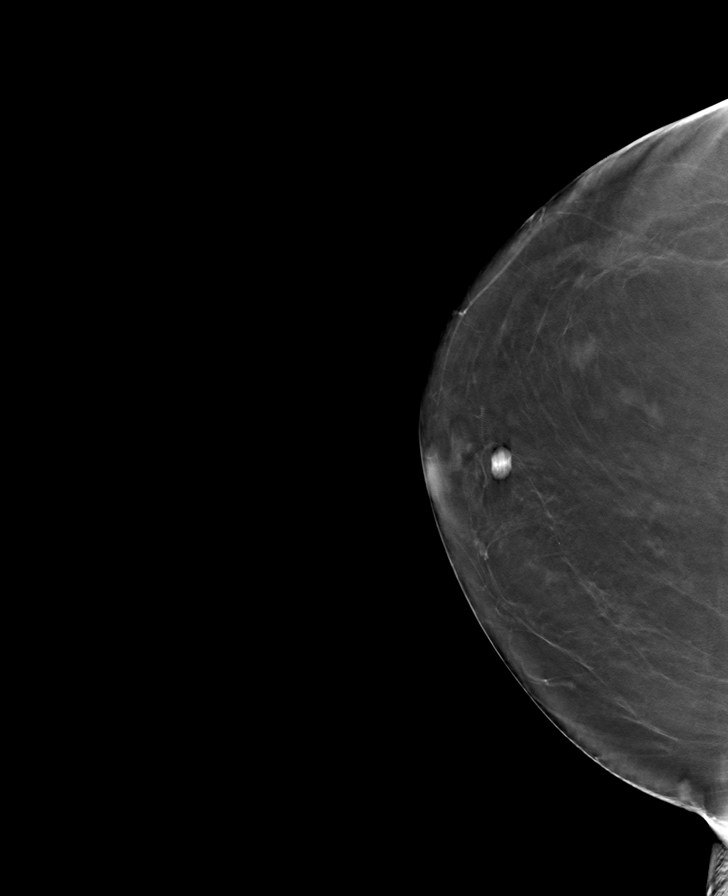

[8 of 24 positions shown; findings below may reference images not displayed]

ACR Breast Density Category b: There are scattered areas of
fibroglandular density.
FINDINGS: There are no findings suspicious for malignancy. Images were
processed with CAD.
IMPRESSION: No mammographic evidence of malignancy. A result letter of this
screening mammogram will be mailed directly to the patient.

RECOMMENDATION:
Screening mammogram in one year. (Code:[id number])

BI-RADS CATEGORY  1: Negative.

## 2018-12-03 DIAGNOSIS — J40 Bronchitis, not specified as acute or chronic: Secondary | ICD-10-CM | POA: Diagnosis not present

## 2018-12-03 DIAGNOSIS — R197 Diarrhea, unspecified: Secondary | ICD-10-CM | POA: Diagnosis not present

## 2019-01-07 DIAGNOSIS — E669 Obesity, unspecified: Secondary | ICD-10-CM | POA: Diagnosis not present

## 2019-01-07 DIAGNOSIS — M1811 Unilateral primary osteoarthritis of first carpometacarpal joint, right hand: Secondary | ICD-10-CM | POA: Diagnosis not present

## 2019-01-07 DIAGNOSIS — M1812 Unilateral primary osteoarthritis of first carpometacarpal joint, left hand: Secondary | ICD-10-CM | POA: Diagnosis not present

## 2019-02-11 DIAGNOSIS — Z961 Presence of intraocular lens: Secondary | ICD-10-CM | POA: Diagnosis not present

## 2019-02-11 DIAGNOSIS — H02839 Dermatochalasis of unspecified eye, unspecified eyelid: Secondary | ICD-10-CM | POA: Diagnosis not present

## 2019-02-11 DIAGNOSIS — H43393 Other vitreous opacities, bilateral: Secondary | ICD-10-CM | POA: Diagnosis not present

## 2019-05-08 DIAGNOSIS — I10 Essential (primary) hypertension: Secondary | ICD-10-CM | POA: Diagnosis not present

## 2019-05-15 DIAGNOSIS — Z0000 Encounter for general adult medical examination without abnormal findings: Secondary | ICD-10-CM | POA: Diagnosis not present

## 2019-05-15 DIAGNOSIS — E6609 Other obesity due to excess calories: Secondary | ICD-10-CM | POA: Diagnosis not present

## 2019-05-15 DIAGNOSIS — I10 Essential (primary) hypertension: Secondary | ICD-10-CM | POA: Diagnosis not present

## 2019-05-15 DIAGNOSIS — E559 Vitamin D deficiency, unspecified: Secondary | ICD-10-CM | POA: Diagnosis not present

## 2019-06-04 DIAGNOSIS — L820 Inflamed seborrheic keratosis: Secondary | ICD-10-CM | POA: Diagnosis not present

## 2019-06-04 DIAGNOSIS — L538 Other specified erythematous conditions: Secondary | ICD-10-CM | POA: Diagnosis not present

## 2019-06-04 DIAGNOSIS — Z85828 Personal history of other malignant neoplasm of skin: Secondary | ICD-10-CM | POA: Diagnosis not present

## 2019-06-04 DIAGNOSIS — D485 Neoplasm of uncertain behavior of skin: Secondary | ICD-10-CM | POA: Diagnosis not present

## 2019-06-04 DIAGNOSIS — L400 Psoriasis vulgaris: Secondary | ICD-10-CM | POA: Diagnosis not present

## 2019-06-04 DIAGNOSIS — R58 Hemorrhage, not elsewhere classified: Secondary | ICD-10-CM | POA: Diagnosis not present

## 2019-06-04 DIAGNOSIS — R208 Other disturbances of skin sensation: Secondary | ICD-10-CM | POA: Diagnosis not present

## 2019-06-04 DIAGNOSIS — Z08 Encounter for follow-up examination after completed treatment for malignant neoplasm: Secondary | ICD-10-CM | POA: Diagnosis not present

## 2019-06-04 DIAGNOSIS — L918 Other hypertrophic disorders of the skin: Secondary | ICD-10-CM | POA: Diagnosis not present

## 2019-06-04 DIAGNOSIS — L821 Other seborrheic keratosis: Secondary | ICD-10-CM | POA: Diagnosis not present

## 2019-07-03 DIAGNOSIS — D485 Neoplasm of uncertain behavior of skin: Secondary | ICD-10-CM | POA: Diagnosis not present

## 2019-07-03 DIAGNOSIS — L989 Disorder of the skin and subcutaneous tissue, unspecified: Secondary | ICD-10-CM | POA: Diagnosis not present

## 2019-07-03 DIAGNOSIS — L905 Scar conditions and fibrosis of skin: Secondary | ICD-10-CM | POA: Diagnosis not present

## 2019-10-02 DIAGNOSIS — Z08 Encounter for follow-up examination after completed treatment for malignant neoplasm: Secondary | ICD-10-CM | POA: Diagnosis not present

## 2019-10-02 DIAGNOSIS — L905 Scar conditions and fibrosis of skin: Secondary | ICD-10-CM | POA: Diagnosis not present

## 2019-11-04 DIAGNOSIS — D2262 Melanocytic nevi of left upper limb, including shoulder: Secondary | ICD-10-CM | POA: Diagnosis not present

## 2019-11-04 DIAGNOSIS — L298 Other pruritus: Secondary | ICD-10-CM | POA: Diagnosis not present

## 2019-11-04 DIAGNOSIS — D2261 Melanocytic nevi of right upper limb, including shoulder: Secondary | ICD-10-CM | POA: Diagnosis not present

## 2019-11-04 DIAGNOSIS — Z85828 Personal history of other malignant neoplasm of skin: Secondary | ICD-10-CM | POA: Diagnosis not present

## 2019-11-04 DIAGNOSIS — R208 Other disturbances of skin sensation: Secondary | ICD-10-CM | POA: Diagnosis not present

## 2019-11-04 DIAGNOSIS — L820 Inflamed seborrheic keratosis: Secondary | ICD-10-CM | POA: Diagnosis not present

## 2019-11-04 DIAGNOSIS — L400 Psoriasis vulgaris: Secondary | ICD-10-CM | POA: Diagnosis not present

## 2019-11-04 DIAGNOSIS — R58 Hemorrhage, not elsewhere classified: Secondary | ICD-10-CM | POA: Diagnosis not present

## 2019-11-04 DIAGNOSIS — D2272 Melanocytic nevi of left lower limb, including hip: Secondary | ICD-10-CM | POA: Diagnosis not present

## 2019-11-04 DIAGNOSIS — L821 Other seborrheic keratosis: Secondary | ICD-10-CM | POA: Diagnosis not present

## 2019-11-04 DIAGNOSIS — D485 Neoplasm of uncertain behavior of skin: Secondary | ICD-10-CM | POA: Diagnosis not present

## 2019-11-08 DIAGNOSIS — I10 Essential (primary) hypertension: Secondary | ICD-10-CM | POA: Diagnosis not present

## 2019-11-15 DIAGNOSIS — E559 Vitamin D deficiency, unspecified: Secondary | ICD-10-CM | POA: Diagnosis not present

## 2019-11-15 DIAGNOSIS — I10 Essential (primary) hypertension: Secondary | ICD-10-CM | POA: Diagnosis not present

## 2019-11-15 DIAGNOSIS — E6609 Other obesity due to excess calories: Secondary | ICD-10-CM | POA: Diagnosis not present

## 2020-03-11 DIAGNOSIS — E559 Vitamin D deficiency, unspecified: Secondary | ICD-10-CM | POA: Diagnosis not present

## 2020-03-11 DIAGNOSIS — I10 Essential (primary) hypertension: Secondary | ICD-10-CM | POA: Diagnosis not present

## 2020-03-18 DIAGNOSIS — I10 Essential (primary) hypertension: Secondary | ICD-10-CM | POA: Diagnosis not present

## 2020-03-18 DIAGNOSIS — N1831 Chronic kidney disease, stage 3a: Secondary | ICD-10-CM | POA: Insufficient documentation

## 2020-03-18 DIAGNOSIS — N183 Chronic kidney disease, stage 3 unspecified: Secondary | ICD-10-CM | POA: Insufficient documentation

## 2020-03-18 DIAGNOSIS — Z136 Encounter for screening for cardiovascular disorders: Secondary | ICD-10-CM | POA: Diagnosis not present

## 2020-03-18 DIAGNOSIS — Z1331 Encounter for screening for depression: Secondary | ICD-10-CM | POA: Diagnosis not present

## 2020-03-18 DIAGNOSIS — E559 Vitamin D deficiency, unspecified: Secondary | ICD-10-CM | POA: Diagnosis not present

## 2020-03-24 ENCOUNTER — Other Ambulatory Visit (INDEPENDENT_AMBULATORY_CARE_PROVIDER_SITE_OTHER): Payer: Self-pay | Admitting: Vascular Surgery

## 2020-03-24 DIAGNOSIS — I10 Essential (primary) hypertension: Secondary | ICD-10-CM

## 2020-04-03 ENCOUNTER — Encounter (INDEPENDENT_AMBULATORY_CARE_PROVIDER_SITE_OTHER): Payer: Self-pay | Admitting: Vascular Surgery

## 2020-04-03 ENCOUNTER — Ambulatory Visit (INDEPENDENT_AMBULATORY_CARE_PROVIDER_SITE_OTHER): Payer: PPO

## 2020-04-03 ENCOUNTER — Other Ambulatory Visit: Payer: Self-pay

## 2020-04-03 ENCOUNTER — Ambulatory Visit (INDEPENDENT_AMBULATORY_CARE_PROVIDER_SITE_OTHER): Payer: PPO | Admitting: Vascular Surgery

## 2020-04-03 VITALS — BP 172/73 | HR 64 | Resp 18 | Ht 64.0 in | Wt 228.0 lb

## 2020-04-03 DIAGNOSIS — N183 Chronic kidney disease, stage 3 unspecified: Secondary | ICD-10-CM

## 2020-04-03 DIAGNOSIS — I701 Atherosclerosis of renal artery: Secondary | ICD-10-CM | POA: Insufficient documentation

## 2020-04-03 DIAGNOSIS — I150 Renovascular hypertension: Secondary | ICD-10-CM | POA: Diagnosis not present

## 2020-04-03 DIAGNOSIS — I10 Essential (primary) hypertension: Secondary | ICD-10-CM

## 2020-04-03 DIAGNOSIS — E559 Vitamin D deficiency, unspecified: Secondary | ICD-10-CM | POA: Insufficient documentation

## 2020-04-03 NOTE — Assessment & Plan Note (Signed)
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  The right renal artery velocities and findings did not demonstrate a hemodynamically significant stenosis.  She has suboptimal blood pressure control on multiple medications as well as decline in renal function.  Renal artery angiogram with possible revascularization is indicated.  We discussed the pathophysiology and natural history of renovascular hypertension.  We discussed the reason and rationale for treatment.  She voices her understanding and is agreeable to proceed with renal artery angiogram and possible stent placement in the near future.

## 2020-04-03 NOTE — Assessment & Plan Note (Signed)
Another concern for possible renal artery issues.  Blood pressure control is important for reducing progression of her chronic kidney disease.

## 2020-04-03 NOTE — Patient Instructions (Signed)
Renal Artery Stenosis Renal artery stenosis (RAS) is a narrowing of the artery that carries blood to the kidneys. It can affect one or both kidneys. The kidneys filter waste and extra fluid from the blood. Waste and fluid are then removed when a person passes urine. The kidneys also make an important chemical messenger (hormone) called renin. Renin helps regulate blood pressure. The first sign of RAS may be high blood pressure. Other symptoms can develop over time. What are the causes? A common cause of this condition is plaque buildup in your arteries (atherosclerosis). The plaques that cause this are made up of:  Fat.  Cholesterol.  Calcium.  Other substances. As these substances build up in your renal artery, the blood supply to your kidneys slows. The lack of blood and oxygen causes the signs and symptoms of RAS. A much less common cause of RAS is a disease called fibromuscular dysplasia. This disease causes abnormal cell growth that narrows the renal artery. It is not related to atherosclerosis. It occurs mostly in women who are 25-50 years old. It may be passed down through families.  What increases the risk? You are more likely to develop this condition if you:  Are a man who is at least 82 years old.  Are a woman who is at least 82 years old.  Have high blood pressure.  Have high cholesterol.  Are a smoker.  Abuse alcohol.  Have diabetes or prediabetes.  Are overweight or obese.  Have a family history of early heart disease. What are the signs or symptoms? RAS usually develops slowly. You may not have any signs or symptoms at first. Early signs may include:  Development of high blood pressure.  A sudden increase in existing high blood pressure.  No longer responding to medicine that used to control your blood pressure. Later signs and symptoms are due to kidney damage. They may include:  Feeling tired (fatigue).  Shortness of breath.  Swollen legs and  feet.  Dry skin.  Headaches.  Muscle cramps.  Loss of appetite.  Nausea or vomiting. How is this diagnosed? This condition may be diagnosed based on:  Your symptoms and medical history. Your health care provider may suspect RAS based on changes in your blood pressure and your risk factors.  A physical exam. During the exam, your health care provider will use a stethoscope to listen for a whooshing sound (bruit) that can occur where the renal artery is blocking blood flow.  Various tests. These may include: ? Blood and urine tests to check your kidney function. ? Imaging tests of your kidneys, such as:  A test that uses sound waves to create an image of your kidneys and the blood flow to your kidneys (ultrasound).  A test in which dye is injected into one of your blood vessels so images can be taken as the dye flows through your renal arteries (angiogram). This can be done using X-rays, a CT scan (computed tomography angiogram, CTA), or a type of MRI (magnetic resonance angiogram, MRA). How is this treated? Making lifestyle changes to reduce your risk factors is the first treatment option for early RAS. If the blood flow to one of your kidneys is cut by more than half, you may need medicine to:  Lower your blood pressure. This is the main medical treatment for RAS. You may need more than one type of medicine for this. The types that work best for people with RAS are: ? ACE inhibitors. ? Angiotensin receptor   blockers.  Reduce fluid in the body (diuretics).  Lower your cholesterol (statins). If medicine is not enough to control RAS, you may need surgery. This may involve:  Threading a tube with an inflatable balloon into the renal artery to force it to open (angioplasty).  Removing plaque from inside the artery (endarterectomy). Follow these instructions at home:  Lifestyle  Make any lifestyle changes recommended by your health care provider. This may include: ? Working with  a dietitian to maintain a heart-healthy diet. This type of diet is low in saturated fat, salt, and added sugar. ? Starting an exercise program as directed by your health care provider. ? Maintaining a healthy weight. ? Quitting smoking. ? Not abusing alcohol. General instructions  Take over-the-counter and prescription medicines only as told by your health care provider.  Keep all follow-up visits as told by your health care provider. This is important. Contact a health care provider if:  Your symptoms of RAS are not getting better.  Your symptoms are changing or getting worse. Get help right away if you have:  Very bad pain in your back or abdomen.  Blood in your urine. Summary  Renal artery stenosis (RAS) is a narrowing of the artery that carries blood to the kidneys. It can affect one or both kidneys.  RAS usually develops slowly. You may not have any signs or symptoms at first, but high blood pressure that is difficult to control is a key symptom.  Making lifestyle changes to reduce your risk factors is the first treatment option for early RAS. If the blood flow to one of your kidneys is cut by more than half, you may need medicines to help manage your cholesterol and blood pressure. This information is not intended to replace advice given to you by your health care provider. Make sure you discuss any questions you have with your health care provider. Document Revised: 12/11/2017 Document Reviewed: 12/11/2017 [employer] Patient Education  2020 [employer]  

## 2020-04-03 NOTE — Progress Notes (Signed)
Patient ID: Julie Mcbride, female   DOB: Dec 15, 1935, 84 y.o.   MRN: [id number]  Chief Complaint  Patient presents with  . New Patient (Initial Visit)    consult    HPI Julie Mcbride is a 84 y.o. female.  I am asked to see the patient by Dr. Netty Starring for evaluation of uncontrolled hypertension.  The patient has had hypertension for many years with good control.  Recently, she has had a significant decline in her kidney function as well as a significant deterioration in her blood pressure control.  She is now on multiple agents and her blood pressures are rarely below 140.  Her blood pressure today was 172/73.  And her primary care physician's office recently, it was over 180 which she thinks is probably the highest blood pressure she is ever recorded.  No pain or other symptoms.  Her creatinine clearance was over 60 six months ago when she says it is now down in the 40s.  Her primary care physician astutely considered renovascular hypertension as a cause of her renal function decline and blood pressure deterioration.  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  The right renal artery velocities and findings did not demonstrate a hemodynamically significant stenosis.     Past Medical History:  Diagnosis Date  . Arthritis    Knees, hands  . Basal cell carcinoma    back - removed  . Bladder polyp    With previos hematuria  . Diverticulitis   . Dyspnea on exertion    Chronic  . GERD (gastroesophageal reflux disease)   . History of ETT    Myoview 7/10 EF 78%, no ischemia or infarction. Poor exercise tolerance (2'30", stopped due to fatigue). Reached 86% MPHR.  Marland Kitchen Hypertension   . Kidney stones   . Multiple allergies    Chronic  . Palpitations    Holter 7/10 with rare PACs  . Postmenopausal   . S/P hysterectomy   . S/P laminectomy   . 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 ABDOMINAL HYSTERECTOMY    . ANKLE FRACTURE SURGERY Right 9/14   Dr. Ulice Dash  . BACK SURGERY    . BASAL CELL CARCINOMA EXCISION     back  . BREAST BIOPSY Right 06/19/2017   Affirm Bx- Radial Scar  . BREAST EXCISIONAL BIOPSY Right 1996   neg  . BREAST LUMPECTOMY WITH NEEDLE LOCALIZATION Right 08/11/2017   Procedure: BREAST LUMPECTOMY WITH NEEDLE LOCALIZATION;  Surgeon: Leonie Green, MD;  Location: [facility];  Service: General;  Laterality: Right;  . CARPAL TUNNEL RELEASE Right   . CATARACT EXTRACTION W/ INTRAOCULAR LENS  IMPLANT, BILATERAL    . EXCISION OF BREAST BIOPSY Right 08/11/2017   excision of radial Scar  . EYE SURGERY    . JOINT REPLACEMENT Right    knee  . KNEE ARTHROSCOPY Right 04/22/2016   Procedure: ARTHROSCOPY KNEE;  Surgeon: Leanor Kail, MD;  Location: [facility];  Service: Orthopedics;  Laterality: Right;  . knee replacement Right 01/11/2017  . NASAL SINUS SURGERY  06/05/14   Dr. Pryor Ochoa  . POSTERIOR CERVICAL LAMINECTOMY  2005   C5-7; plate and screws  . TONSILLECTOMY       Family History  Problem Relation Age of  Onset  . Ovarian cancer Mother   . Stroke Father        CVA  . Breast cancer Daughter 30  . Breast cancer Maternal Aunt 60  . Breast cancer Maternal Aunt 90     Social History   Tobacco Use  . Smoking status: Never Smoker  . Smokeless tobacco: Never Used  Substance Use Topics  . Alcohol use: No  . Drug use: No    Allergies  Allergen Reactions  . Chlorhexidine Hives    SAGE wipes caused severe rash  . Cyclobenzaprine Hcl     Doesn't remember reaction  . Etodolac     Doesn't remember reaction  . Lisinopril     Coughing up blood  . Meloxicam     Doesn't remember reaction  . Metaxalone     Doesn't remember reaction  . Neomycin-Bacitracin Zn-Polymyx     Skin irritation  . Nifedipine  Swelling  . Nitrofurantoin     Doesn't remember reaction  . Sulfamethoxazole-Trimethoprim Hives  . Sulfonamide Derivatives Hives  . Tizanidine     Liver problems  . Vasotec [Enalapril Maleate]     Doesn't remember reaction  . Ibuprofen Rash  . Tape Rash    Current Outpatient Medications  Medication Sig Dispense Refill  . bisoprolol-hydrochlorothiazide (ZIAC) 10-6.25 MG per tablet Take 1 tablet by mouth daily.      . cholecalciferol (VITAMIN D) 1000 units tablet Take 1,000 Units by mouth daily.    . clobetasol ointment (TEMOVATE) [id number] % Apply 1 application topically 2 (two) times daily as needed for rash.    . hydrALAZINE (APRESOLINE) 25 MG tablet Take 25 mg by mouth 3 (three) times daily.    Marland Kitchen losartan (COZAAR) 50 MG tablet Take 50 mg by mouth daily.    . Multiple Vitamin (MULTIVITAMIN) capsule Take 2 capsules by mouth daily. [employer] MVI    . rOPINIRole (REQUIP) 0.25 MG tablet Take 0.5 mg by mouth at bedtime.    Marland Kitchen loratadine (CLARITIN) 10 MG tablet Take 10 mg by mouth daily as needed for allergies.     . ranitidine (ZANTAC) 150 MG tablet Take 150 mg by mouth daily as needed for heartburn.     . vitamin C (ASCORBIC ACID) 500 MG tablet Take 500 mg by mouth daily.     No current facility-administered medications for this visit.      REVIEW OF SYSTEMS (Negative unless checked)  Constitutional: [] Weight loss  [] Fever  [] Chills Cardiac: [] Chest pain   [] Chest pressure   [x] Palpitations   [] Shortness of breath when laying flat   [] Shortness of breath at rest   [] Shortness of breath with exertion. Vascular:  [] Pain in legs with walking   [] Pain in legs at rest   [] Pain in legs when laying flat   [] Claudication   [] Pain in feet when walking  [] Pain in feet at rest  [] Pain in feet when laying flat   [] History of DVT   [] Phlebitis   [] Swelling in legs   [] Varicose veins   [] Non-healing ulcers Pulmonary:   [] Uses home oxygen   [] Productive cough   [] Hemoptysis   [] Wheeze  [] COPD    [] Asthma Neurologic:  [] Dizziness  [] Blackouts   [] Seizures   [] History of stroke   [] History of TIA  [] Aphasia   [] Temporary blindness   [] Dysphagia   [] Weakness or numbness in arms   [] Weakness or numbness in legs Musculoskeletal:  [x] Arthritis   [] Joint swelling   [x] Joint pain   [] Low back  pain Hematologic:  [] Easy bruising  [] Easy bleeding   [] Hypercoagulable state   [] Anemic  [] Hepatitis Gastrointestinal:  [] Blood in stool   [] Vomiting blood  [x] Gastroesophageal reflux/heartburn   [] Abdominal pain Genitourinary:  [x] Chronic kidney disease   [] Difficult urination  [] Frequent urination  [] Burning with urination   [] Hematuria Skin:  [] Rashes   [] Ulcers   [] Wounds Psychological:  [] History of anxiety   []  History of major depression.    Physical Exam BP (!) 172/73 (BP Location: Right Arm)   Pulse 64   Resp 18   Ht 5\' 4"  (1.626 m)   Wt 228 lb (103.4 kg)   BMI 39.14 kg/m  Gen:  WD/WN, NAD Head: [place]/AT, No temporalis wasting.  Ear/Nose/Throat: Hearing grossly intact, nares w/o erythema or drainage, oropharynx w/o Erythema/Exudate Eyes: Conjunctiva clear, sclera non-icteric  Neck: trachea midline.  No JVD.  Pulmonary:  Good air movement, respirations not labored, no use of accessory muscles  Cardiac: RRR, no JVD Vascular:  Vessel Right Left  Radial Palpable Palpable                                   Gastrointestinal:. No masses, surgical incisions, or scars. Musculoskeletal: M/S 5/5 throughout.  Extremities without ischemic changes.  No deformity or atrophy. Trace LE edema. Neurologic: Sensation grossly intact in extremities.  Symmetrical.  Speech is fluent. Motor exam as listed above. Psychiatric: Judgment intact, Mood & affect appropriate for pt's clinical situation. Dermatologic: No rashes or ulcers noted.  No cellulitis or open wounds.    Radiology No results found.  Labs No results found for this or any previous visit (from the past 2160  hour(s)).  Assessment/Plan:  CKD (chronic kidney disease) stage 3, GFR 30-59 ml/min Another concern for possible renal artery issues.  Blood pressure control is important for reducing progression of her chronic kidney disease.  Renovascular hypertension A renal artery duplex was done today to evaluate for this, and it demonstrated velocities and findings consistent with a greater than 60% left renal artery stenosis.  The right renal artery velocities and findings did not demonstrate a hemodynamically significant stenosis.  She has suboptimal blood pressure control on multiple medications as well as decline in renal function.  Renal artery angiogram with possible revascularization is indicated.  We discussed the pathophysiology and natural history of renovascular hypertension.  We discussed the reason and rationale for treatment.  She voices her understanding and is agreeable to proceed with renal artery angiogram and possible stent placement in the near future.  Renal artery stenosis (HCC) A renal artery duplex was done today to evaluate for this, and it demonstrated velocities and findings consistent with a greater than 60% left renal artery stenosis.  The right renal artery velocities and findings did not demonstrate a hemodynamically significant stenosis.  She has suboptimal blood pressure control on multiple medications as well as decline in renal function.  Renal artery angiogram with possible revascularization is indicated.  We discussed the pathophysiology and natural history of renovascular hypertension.  We discussed the reason and rationale for treatment.  She voices her understanding and is agreeable to proceed with renal artery angiogram and possible stent placement in the near future.      Leotis Pain 04/03/2020, 9:51 AM   This note was created with Dragon medical transcription system.  Any errors from dictation are unintentional.

## 2020-04-07 ENCOUNTER — Telehealth (INDEPENDENT_AMBULATORY_CARE_PROVIDER_SITE_OTHER): Payer: Self-pay

## 2020-04-07 NOTE — Telephone Encounter (Signed)
Spoke with the patient and she is now scheduled with Dr. Lucky Cowboy for a left renal angio on 04/20/20 with a 9:15 am arrival time to the [facility]. Patient will do covid testing on 04/16/20 between 8-1 pm at the [facility]. Pre-procedure instructions were discussed and will be mailed to the patient.

## 2020-04-16 ENCOUNTER — Other Ambulatory Visit
Admission: RE | Admit: 2020-04-16 | Discharge: 2020-04-16 | Disposition: A | Discharge status: Home / Self Care | Payer: PPO | Source: Ambulatory Visit | Attending: Vascular Surgery | Admitting: Vascular Surgery

## 2020-04-16 DIAGNOSIS — Z01812 Encounter for preprocedural laboratory examination: Secondary | ICD-10-CM | POA: Insufficient documentation

## 2020-04-16 DIAGNOSIS — Z20822 Contact with and (suspected) exposure to covid-19: Secondary | ICD-10-CM | POA: Insufficient documentation

## 2020-04-16 LAB — SARS CORONAVIRUS 2 (TAT 6-24 HRS): SARS Coronavirus 2: NEGATIVE

## 2020-04-20 ENCOUNTER — Encounter: Payer: Self-pay | Admitting: Vascular Surgery

## 2020-04-20 ENCOUNTER — Encounter
Admission: RE | Disposition: A | Discharge status: Home / Self Care | Payer: Self-pay | Source: Home / Self Care | Attending: Vascular Surgery

## 2020-04-20 ENCOUNTER — Ambulatory Visit
Admission: RE | Admit: 2020-04-20 | Discharge: 2020-04-20 | Disposition: A | Discharge status: Home / Self Care | Payer: PPO | Attending: Vascular Surgery | Admitting: Vascular Surgery

## 2020-04-20 ENCOUNTER — Other Ambulatory Visit: Payer: Self-pay

## 2020-04-20 ENCOUNTER — Other Ambulatory Visit (INDEPENDENT_AMBULATORY_CARE_PROVIDER_SITE_OTHER): Payer: Self-pay | Admitting: Nurse Practitioner

## 2020-04-20 DIAGNOSIS — K219 Gastro-esophageal reflux disease without esophagitis: Secondary | ICD-10-CM | POA: Insufficient documentation

## 2020-04-20 DIAGNOSIS — Z881 Allergy status to other antibiotic agents status: Secondary | ICD-10-CM | POA: Insufficient documentation

## 2020-04-20 DIAGNOSIS — Z882 Allergy status to sulfonamides status: Secondary | ICD-10-CM | POA: Insufficient documentation

## 2020-04-20 DIAGNOSIS — Z79899 Other long term (current) drug therapy: Secondary | ICD-10-CM | POA: Diagnosis not present

## 2020-04-20 DIAGNOSIS — I701 Atherosclerosis of renal artery: Secondary | ICD-10-CM | POA: Diagnosis not present

## 2020-04-20 DIAGNOSIS — I129 Hypertensive chronic kidney disease with stage 1 through stage 4 chronic kidney disease, or unspecified chronic kidney disease: Secondary | ICD-10-CM | POA: Diagnosis not present

## 2020-04-20 DIAGNOSIS — Z888 Allergy status to other drugs, medicaments and biological substances status: Secondary | ICD-10-CM | POA: Insufficient documentation

## 2020-04-20 DIAGNOSIS — N183 Chronic kidney disease, stage 3 unspecified: Secondary | ICD-10-CM | POA: Diagnosis not present

## 2020-04-20 HISTORY — PX: RENAL ANGIOGRAPHY: CATH118260

## 2020-04-20 LAB — CREATININE, SERUM
Creatinine, Ser: 1.02 mg/dL — ABNORMAL HIGH (ref 0.44–1.00)
GFR calc Af Amer: 60 mL/min — ABNORMAL LOW (ref >60)
GFR calc non Af Amer: 52 mL/min — ABNORMAL LOW (ref >60)

## 2020-04-20 LAB — BUN: BUN: 20 mg/dL (ref 8–23)

## 2020-04-20 SURGERY — RENAL ANGIOGRAPHY
Anesthesia: Moderate Sedation | Laterality: Left

## 2020-04-20 MED ORDER — HEPARIN SODIUM (PORCINE) 1000 UNIT/ML IJ SOLN
INTRAMUSCULAR | Status: DC | PRN
  Administered 2020-04-20: 5000 [IU] via INTRAVENOUS

## 2020-04-20 MED ORDER — CEFAZOLIN SODIUM-DEXTROSE 2-4 GM/100ML-% IV SOLN: 2.0000 g | Freq: Once | INTRAVENOUS | Status: AC

## 2020-04-20 MED ORDER — CLOPIDOGREL BISULFATE 75 MG PO TABS
ORAL_TABLET | ORAL | Status: AC
  Administered 2020-04-20: 150 mg via ORAL
  Filled 2020-04-20: qty 2

## 2020-04-20 MED ORDER — METHYLPREDNISOLONE SODIUM SUCC 125 MG IJ SOLR: 125.0000 mg | Freq: Once | INTRAMUSCULAR | Status: DC | PRN

## 2020-04-20 MED ORDER — FENTANYL CITRATE (PF) 100 MCG/2ML IJ SOLN
INTRAMUSCULAR | Status: DC | PRN
  Administered 2020-04-20: 25 ug via INTRAVENOUS
  Administered 2020-04-20: 50 ug via INTRAVENOUS

## 2020-04-20 MED ORDER — MIDAZOLAM HCL 2 MG/2ML IJ SOLN
INTRAMUSCULAR | Status: DC | PRN
  Administered 2020-04-20: 2 mg via INTRAVENOUS
  Administered 2020-04-20: 1 mg via INTRAVENOUS

## 2020-04-20 MED ORDER — SODIUM CHLORIDE 0.9 % IV SOLN: INTRAVENOUS | Status: DC

## 2020-04-20 MED ORDER — MIDAZOLAM HCL 5 MG/5ML IJ SOLN
INTRAMUSCULAR | Status: AC
  Filled 2020-04-20: qty 5

## 2020-04-20 MED ORDER — HYDROMORPHONE HCL 1 MG/ML IJ SOLN: 1.0000 mg | Freq: Once | INTRAMUSCULAR | Status: DC | PRN

## 2020-04-20 MED ORDER — FENTANYL CITRATE (PF) 100 MCG/2ML IJ SOLN
INTRAMUSCULAR | Status: AC
  Filled 2020-04-20: qty 2

## 2020-04-20 MED ORDER — FAMOTIDINE 20 MG PO TABS: 40.0000 mg | ORAL_TABLET | Freq: Once | ORAL | Status: DC | PRN

## 2020-04-20 MED ORDER — CLOPIDOGREL BISULFATE 75 MG PO TABS: 75.0000 mg | ORAL_TABLET | Freq: Every day | ORAL | 11 refills | Status: DC

## 2020-04-20 MED ORDER — CLOPIDOGREL BISULFATE 75 MG PO TABS: 150.0000 mg | ORAL_TABLET | Freq: Once | ORAL | Status: AC

## 2020-04-20 MED ORDER — DIPHENHYDRAMINE HCL 50 MG/ML IJ SOLN: 50.0000 mg | Freq: Once | INTRAMUSCULAR | Status: DC | PRN

## 2020-04-20 MED ORDER — ATORVASTATIN CALCIUM 10 MG PO TABS: 10.0000 mg | ORAL_TABLET | Freq: Every day | ORAL | 11 refills | Status: AC

## 2020-04-20 MED ORDER — ONDANSETRON HCL 4 MG/2ML IJ SOLN: 4.0000 mg | Freq: Four times a day (QID) | INTRAMUSCULAR | Status: DC | PRN

## 2020-04-20 MED ORDER — MIDAZOLAM HCL 2 MG/ML PO SYRP: 8.0000 mg | ORAL_SOLUTION | Freq: Once | ORAL | Status: DC | PRN

## 2020-04-20 MED ORDER — HEPARIN SODIUM (PORCINE) 1000 UNIT/ML IJ SOLN
INTRAMUSCULAR | Status: AC
  Filled 2020-04-20: qty 1

## 2020-04-20 MED ORDER — CEFAZOLIN SODIUM-DEXTROSE 2-4 GM/100ML-% IV SOLN
INTRAVENOUS | Status: AC
  Administered 2020-04-20: 2 g via INTRAVENOUS
  Filled 2020-04-20: qty 100

## 2020-04-20 SURGICAL SUPPLY — 13 items
CATH ANGIO 5F PIGTAIL 65CM (CATHETERS) ×2 IMPLANT
CATH BEACON 5 .035 65 C2 TIP (CATHETERS) ×2 IMPLANT
DEVICE PRESTO INFLATION (MISCELLANEOUS) ×2 IMPLANT
DEVICE STARCLOSE SE CLOSURE (Vascular Products) ×2 IMPLANT
GLIDEWIRE STIFF .35X180X3 HYDR (WIRE) ×2 IMPLANT
PACK ANGIOGRAPHY (CUSTOM PROCEDURE TRAY) ×2 IMPLANT
SHEATH ANL2 6FRX45 HC (SHEATH) ×2 IMPLANT
SHEATH BRITE TIP 5FRX11 (SHEATH) ×2 IMPLANT
STENT LIFESTREAM 6X26X80 (Permanent Stent) ×2 IMPLANT
SYR MEDRAD MARK 7 150ML (SYRINGE) ×2 IMPLANT
TUBING CONTRAST HIGH PRESS 72 (TUBING) ×2 IMPLANT
WIRE J 3MM .035X145CM (WIRE) ×2 IMPLANT
WIRE MAGIC TORQUE 260C (WIRE) ×2 IMPLANT

## 2020-04-20 NOTE — H&P (Signed)
[facility] History & Physical Update  The patient was interviewed and re-examined.  The patient's previous History and Physical has been reviewed and is unchanged.  There is no change in the plan of care. We plan to proceed with the scheduled procedure.  Leotis Pain, MD  04/20/2020, 9:15 AM

## 2020-04-20 NOTE — Discharge Instructions (Signed)
Femoral Site Care This sheet gives you information about how to care for yourself after your procedure. Your health care provider may also give you more specific instructions. If you have problems or questions, contact your health care provider. What can I expect after the procedure? After the procedure, it is common to have:  Bruising that usually fades within 1-2 weeks.  Tenderness at the site. Follow these instructions at home: Wound care  Follow instructions from your health care provider about how to take care of your insertion site. Make sure you: ? Wash your hands with soap and water before you change your bandage (dressing). If soap and water are not available, use hand sanitizer. ? Change your dressing as told by your health care provider. ? Leave stitches (sutures), skin glue, or adhesive strips in place. These skin closures may need to stay in place for 2 weeks or longer. If adhesive strip edges start to loosen and curl up, you may trim the loose edges. Do not remove adhesive strips completely unless your health care provider tells you to do that.  Do not take baths, swim, or use a hot tub until your health care provider approves.  You may shower 24-48 hours after the procedure or as told by your health care provider. ? Gently wash the site with plain soap and water. ? Pat the area dry with a clean towel. ? Do not rub the site. This may cause bleeding.  Do not apply powder or lotion to the site. Keep the site clean and dry.  Check your femoral site every day for signs of infection. Check for: ? Redness, swelling, or pain. ? Fluid or blood. ? Warmth. ? Pus or a bad smell. Activity  For the first 2-3 days after your procedure, or as long as directed: ? Avoid climbing stairs as much as possible. ? Do not squat.  Do not lift anything that is heavier than 10 lb (4.5 kg), or the limit that you are told, until your health care provider says that it is safe.  Rest as  directed. ? Avoid sitting for a long time without moving. Get up to take short walks every 1-2 hours.  Do not drive for 24 hours if you were given a medicine to help you relax (sedative). General instructions  Take over-the-counter and prescription medicines only as told by your health care provider.  Keep all follow-up visits as told by your health care provider. This is important. Contact a health care provider if you have:  A fever or chills.  You have redness, swelling, or pain around your insertion site. Get help right away if:  The catheter insertion area swells very fast.  You pass out.  You suddenly start to sweat or your skin gets clammy.  The catheter insertion area is bleeding, and the bleeding does not stop when you hold steady pressure on the area.  The area near or just beyond the catheter insertion site becomes pale, cool, tingly, or numb. These symptoms may represent a serious problem that is an emergency. Do not wait to see if the symptoms will go away. Get medical help right away. Call your local emergency services (911 in the [place] Do not drive yourself to the hospital. Summary  After the procedure, it is common to have bruising that usually fades within 1-2 weeks.  Check your femoral site every day for signs of infection.  Do not lift anything that is heavier than 10 lb (4.5 kg), or the   limit that you are told, until your health care provider says that it is safe. This information is not intended to replace advice given to you by your health care provider. Make sure you discuss any questions you have with your health care provider. Document Revised: 11/27/2017 Document Reviewed: 11/27/2017 Elsevier Patient Education  2020 [employer]. Moderate Conscious Sedation, Adult, Care After These instructions provide you with information about caring for yourself after your procedure. Your health care provider may also give you more specific instructions. Your  treatment has been planned according to current medical practices, but problems sometimes occur. Call your health care provider if you have any problems or questions after your procedure. What can I expect after the procedure? After your procedure, it is common:  To feel sleepy for several hours.  To feel clumsy and have poor balance for several hours.  To have poor judgment for several hours.  To vomit if you eat too soon. Follow these instructions at home: For at least 24 hours after the procedure:   Do not: ? Participate in activities where you could fall or become injured. ? Drive. ? Use heavy machinery. ? Drink alcohol. ? Take sleeping pills or medicines that cause drowsiness. ? Make important decisions or sign legal documents. ? Take care of children on your own.  Rest. Eating and drinking  Follow the diet recommended by your health care provider.  If you vomit: ? Drink water, juice, or soup when you can drink without vomiting. ? Make sure you have little or no nausea before eating solid foods. General instructions  Have a responsible adult stay with you until you are awake and alert.  Take over-the-counter and prescription medicines only as told by your health care provider.  If you smoke, do not smoke without supervision.  Keep all follow-up visits as told by your health care provider. This is important. Contact a health care provider if:  You keep feeling nauseous or you keep vomiting.  You feel light-headed.  You develop a rash.  You have a fever. Get help right away if:  You have trouble breathing. This information is not intended to replace advice given to you by your health care provider. Make sure you discuss any questions you have with your health care provider. Document Revised: 10/27/2017 Document Reviewed: 03/05/2016 Elsevier Patient Education  2020 [employer] Angiogram, Care After This sheet gives you information about how to care for  yourself after your procedure. Your doctor may also give you more specific instructions. If you have problems or questions, contact your doctor. Follow these instructions at home: Insertion site care  Follow instructions from your doctor about how to take care of your long, thin tube (catheter) insertion area. Make sure you: ? Wash your hands with soap and water before you change your bandage (dressing). If you cannot use soap and water, use hand sanitizer. ? Change your bandage as told by your doctor. ? Leave stitches (sutures), skin glue, or skin tape (adhesive) strips in place. They may need to stay in place for 2 weeks or longer. If tape strips get loose and curl up, you may trim the loose edges. Do not remove tape strips completely unless your doctor says it is okay.  Do not take baths, swim, or use a hot tub until your doctor says it is okay.  You may shower 24-48 hours after the procedure or as told by your doctor. ? Gently wash the area with plain soap and water. ?   Pat the area dry with a clean towel. ? Do not rub the area. This may cause bleeding.  Do not apply powder or lotion to the area. Keep the area clean and dry.  Check your insertion area every day for signs of infection. Check for: ? More redness, swelling, or pain. ? Fluid or blood. ? Warmth. ? Pus or a bad smell. Activity  Rest as told by your doctor, usually for 1-2 days.  Do not lift anything that is heavier than 10 lbs. (4.5 kg) or as told by your doctor.  Do not drive for 24 hours if you were given a medicine to help you relax (sedative).  Do not drive or use heavy machinery while taking prescription pain medicine. General instructions   Go back to your normal activities as told by your doctor, usually in about a week. Ask your doctor what activities are safe for you.  If the insertion area starts to bleed, lie flat and put pressure on the area. If the bleeding does not stop, get help right away. This is an  emergency.  Drink enough fluid to keep your pee (urine) clear or pale yellow.  Take over-the-counter and prescription medicines only as told by your doctor.  Keep all follow-up visits as told by your doctor. This is important. Contact a doctor if:  You have a fever.  You have chills.  You have more redness, swelling, or pain around your insertion area.  You have fluid or blood coming from your insertion area.  The insertion area feels warm to the touch.  You have pus or a bad smell coming from your insertion area.  You have more bruising around the insertion area.  Blood collects in the tissue around the insertion area (hematoma) that may be painful to the touch. Get help right away if:  You have a lot of pain in the insertion area.  The insertion area swells very fast.  The insertion area is bleeding, and the bleeding does not stop after holding steady pressure on the area.  The area near or just beyond the insertion area becomes pale, cool, tingly, or numb. These symptoms may be an emergency. Do not wait to see if the symptoms will go away. Get medical help right away. Call your local emergency services (911 in the [place] Do not drive yourself to the hospital. Summary  After the procedure, it is common to have bruising and tenderness at the long, thin tube insertion area.  After the procedure, it is important to rest and drink plenty of fluids.  Do not take baths, swim, or use a hot tub until your doctor says it is okay to do so. You may shower 24-48 hours after the procedure or as told by your doctor.  If the insertion area starts to bleed, lie flat and put pressure on the area. If the bleeding does not stop, get help right away. This is an emergency. This information is not intended to replace advice given to you by your health care provider. Make sure you discuss any questions you have with your health care provider. Document Revised: 10/27/2017 Document Reviewed:  11/08/2016 Elsevier Patient Education  2020 [employer] Inc.  

## 2020-04-20 NOTE — Op Note (Signed)
[facility] Percutaneous Study/Intervention Procedural Note    Surgeon(s): [employer]  Assistants: None  Pre-operative Diagnosis: Left renal artery stenosis, renovascular hypertension, chronic kidney disease  Post-operative diagnosis: Same  Procedure(s) Performed: 1. Ultrasound guidance for vascular access right femoral artery 2. Catheter placement into left renal artery from right femoral approach 3. Aortogram and selective left renal angiogram 4. Balloon expandable stent placement to the left renal artery with a 6 mm diameter x 26 mm length lifestream stent 5. StarClose closure device right femoral artery  Contrast: 20 cc  EBL: 5 cc  Fluoro Time: 3.7 minutes  Moderate conscious sedation: Approximately 20 minutes with 3 mg of Versed and 75 mcg of Fentanyl  Indications: The patient is a 84 year old female with worsening severe hypertension despite multiple medications as well as chronic kidney disease. The patient has suboptimal blood pressure control despite multiple antihypertensives and a noninvasive study demonstrating hemodynamically significant left renal artery stenosis. Given the clinical scenario and the noninvasive findings, angiogram is indicated for further evaluation of her renal artery and potential treatment. Risks and benefits are discussed and informed consent is obtained.  Procedure: The patient was identified and appropriate procedural time out was performed. The patient was then placed supine on the table and prepped and draped in the usual sterile fashion.Moderate conscious sedation was administered with a face to face encounter with the patient throughout the procedure with my supervision of the RN administering medicines and monitoring the patients vital signs and mental status throughout from the start of the procedure until the patient was taken to the  recovery room  Ultrasound was used to evaluate the right common femoral artery. It was patent . A digital ultrasound image was acquired. A Seldinger needle was used to access the right common femoral artery under direct ultrasound guidance and a permanent image was performed. A 0.035 J wire was advanced without resistance and a 5Fr sheath was placed. Pigtail catheter was placed into the aorta at the L1 level and an AP aortogram was performed. This demonstrated that the aorta and iliac arteries were patent without significant disease.  The right renal artery was widely patent.  The left renal artery had a proximal calcific stenosis in the 75 to 80% range at the origin. The patient was then systemically heparinized with 5000 units of intravenous heparin given. I used a C2 catheter to cannulate the left renal artery and selective imaging was performed. This confirmed a 75 to 80% stenosis of the left renal artery.  At this point I selected the glide wire and crossed the lesion without difficulty.  I then advanced the C2 catheter out to the distal main left renal artery and exchanged for a Magic torque wire.  Diagnostic catheter was removed and a 6 [place] Ansell sheath was advanced into the left renal artery. I then selected a 6 mm diameter x 26 mm length lifestream balloon expandable stent and brought this across the lesion.  This was deployed encompassing the lesion with its proximal extent going back into the aorta about 2 to 3 mm.  This was inflated to 10 ATM and the waist resolved.  Completion angiogram showed a widely patent left renal artery stent with no significant residual stenosis and a good nephrogram. Oblique arteriogram was performed of the right femoral artery and StarClose closure device was deployed in the usual fashion with excellent hemostatic result. The patient was taken to the recovery room in stable condition having tolerated the procedure well.  Findings:  Aortogram/Renal  Arteries:The aorta and iliac arteries were patent without significant disease.  The right renal artery was widely patent.  The left renal artery had a proximal calcific stenosis in the 75 to 80% range at the origin.   Condition:  Stable  Complications: None   Leotis Pain 04/20/2020 11:29 AM  This note was created with Dragon Medical transcription system. Any errors in dictation are purely unintentional.

## 2020-05-08 DIAGNOSIS — Z85828 Personal history of other malignant neoplasm of skin: Secondary | ICD-10-CM | POA: Diagnosis not present

## 2020-05-08 DIAGNOSIS — R208 Other disturbances of skin sensation: Secondary | ICD-10-CM | POA: Diagnosis not present

## 2020-05-08 DIAGNOSIS — L400 Psoriasis vulgaris: Secondary | ICD-10-CM | POA: Diagnosis not present

## 2020-05-08 DIAGNOSIS — Z08 Encounter for follow-up examination after completed treatment for malignant neoplasm: Secondary | ICD-10-CM | POA: Diagnosis not present

## 2020-05-08 DIAGNOSIS — L538 Other specified erythematous conditions: Secondary | ICD-10-CM | POA: Diagnosis not present

## 2020-05-08 DIAGNOSIS — L821 Other seborrheic keratosis: Secondary | ICD-10-CM | POA: Diagnosis not present

## 2020-05-08 DIAGNOSIS — R58 Hemorrhage, not elsewhere classified: Secondary | ICD-10-CM | POA: Diagnosis not present

## 2020-05-08 DIAGNOSIS — L820 Inflamed seborrheic keratosis: Secondary | ICD-10-CM | POA: Diagnosis not present

## 2020-05-19 ENCOUNTER — Other Ambulatory Visit: Payer: Self-pay

## 2020-05-19 ENCOUNTER — Other Ambulatory Visit (INDEPENDENT_AMBULATORY_CARE_PROVIDER_SITE_OTHER): Payer: Self-pay | Admitting: Vascular Surgery

## 2020-05-19 ENCOUNTER — Ambulatory Visit (INDEPENDENT_AMBULATORY_CARE_PROVIDER_SITE_OTHER): Payer: PPO

## 2020-05-19 ENCOUNTER — Ambulatory Visit (INDEPENDENT_AMBULATORY_CARE_PROVIDER_SITE_OTHER): Payer: PPO | Admitting: Nurse Practitioner

## 2020-05-19 ENCOUNTER — Encounter (INDEPENDENT_AMBULATORY_CARE_PROVIDER_SITE_OTHER): Payer: Self-pay | Admitting: Nurse Practitioner

## 2020-05-19 VITALS — BP 165/80 | HR 60 | Ht 66.0 in | Wt 226.0 lb

## 2020-05-19 DIAGNOSIS — R21 Rash and other nonspecific skin eruption: Secondary | ICD-10-CM | POA: Diagnosis not present

## 2020-05-19 DIAGNOSIS — I10 Essential (primary) hypertension: Secondary | ICD-10-CM

## 2020-05-19 DIAGNOSIS — I701 Atherosclerosis of renal artery: Secondary | ICD-10-CM | POA: Diagnosis not present

## 2020-05-19 DIAGNOSIS — Z95820 Peripheral vascular angioplasty status with implants and grafts: Secondary | ICD-10-CM

## 2020-05-19 NOTE — Progress Notes (Signed)
Subjective:    Patient ID: Julie Mcbride, female    DOB: [birth year], 84 y.o.   MRN: [id number] Chief Complaint  Patient presents with  . Follow-up    U/S follow Up    Julie Mcbride is a 84 y.o. female.  The patient presents today after renal angiogram on 04/20/2020.  Since the recent procedure the patient reports that her blood pressure has been in the 130s to 140s.  There has also been a decline in the patient's kidney function prior to intervention.  Following the surgery the patient has been doing well and notes that her blood pressures have been well controlled.  She is somewhat anxious and notes that she has whitecoat syndrome so it is elevated in office today.  However, the patient's pain complaint is she has developed a new rash within the last 3 or 4 days over her chest and some on her face.  She denies changing any soaps or detergents.  The only new thing that she is able to think of is the Plavix and atorvastatin.  The patient has not taking any medication for the rash.  She denies any issues with breathing.  She denies any chest pain shortness of breath.  She denies any TIA or amaurosis fugax-like symptoms.  Today noninvasive studies show a patent left renal stent.  There is no evidence of stenosis bilaterally.  There is normal size kidneys bilaterally.   Review of Systems  Skin: Positive for rash.  Psychiatric/Behavioral: The patient is nervous/anxious.   All other systems reviewed and are negative.      Objective:   Physical Exam Vitals reviewed.  Constitutional:      Appearance: She is obese.  HENT:     Head: Normocephalic.  Cardiovascular:     Rate and Rhythm: Normal rate.     Pulses: Normal pulses.     Heart sounds: Normal heart sounds.  Pulmonary:     Effort: Pulmonary effort is normal.     Breath sounds: Normal breath sounds.  Skin:    General: Skin is warm and dry.     Findings: Rash (Chest) present.  Neurological:     Mental Status: She is  alert and oriented to person, place, and time.  Psychiatric:        Mood and Affect: Mood normal.        Behavior: Behavior normal.        Thought Content: Thought content normal.        Judgment: Judgment normal.     BP (!) 165/80   Pulse 60   Ht 5\' 6"  (1.676 m)   Wt 226 lb (102.5 kg)   BMI 36.48 kg/m   Past Medical History:  Diagnosis Date  . Arthritis    Knees, hands  . Basal cell carcinoma    back - removed  . Bladder polyp    With previos hematuria  . Diverticulitis   . Dyspnea on exertion    Chronic  . GERD (gastroesophageal reflux disease)   . History of ETT    Myoview 7/10 EF 78%, no ischemia or infarction. Poor exercise tolerance (2'30", stopped due to fatigue). Reached 86% MPHR.  Marland Kitchen Hypertension   . Kidney stones   . Multiple allergies    Chronic  . Palpitations    Holter 7/10 with rare PACs  . Postmenopausal   . S/P hysterectomy   . S/P laminectomy   . 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 Marital status: Married    Spouse name: Not on file  . Number of children: Not on file  . Years of education: Not on file  . Highest education level: Not on file  Occupational History  . Occupation: Retired  Tobacco Use  . Smoking status: Never Smoker  . Smokeless tobacco: Never Used  Vaping Use  . Vaping Use: Never used  Substance and Sexual Activity  . Alcohol use: No  . Drug use: No  . Sexual activity: Not on file  Other Topics Concern  . Not on file  Social History Narrative   Lives with husband (married)   Social Determinants of Health   Financial Resource Strain:   . Difficulty of Paying Living Expenses:   Food Insecurity:   . Worried About Charity fundraiser in the Last Year:   . Arboriculturist in the Last Year:   Transportation Needs:   . Film/video editor (Medical):   Marland Kitchen Lack of Transportation (Non-Medical):   Physical Activity:   . Days of  Exercise per Week:   . Minutes of Exercise per Session:   Stress:   . Feeling of Stress :   Social Connections:   . Frequency of Communication with Friends and Family:   . Frequency of Social Gatherings with Friends and Family:   . Attends Religious Services:   . Active Member of Clubs or Organizations:   . Attends Archivist Meetings:   Marland Kitchen Marital Status:   Intimate Partner Violence:   . Fear of Current or Ex-Partner:   . Emotionally Abused:   Marland Kitchen Physically Abused:   . Sexually Abused:     Past Surgical History:  Procedure Laterality Date  . ABDOMINAL HYSTERECTOMY    . ANKLE FRACTURE SURGERY Right 9/14   Dr. Ulice Dash  . BACK SURGERY    . BASAL CELL CARCINOMA EXCISION     back  . BREAST BIOPSY Right 06/19/2017   Affirm Bx- Radial Scar  . BREAST EXCISIONAL BIOPSY Right 1996   neg  . BREAST LUMPECTOMY WITH NEEDLE LOCALIZATION Right 08/11/2017   Procedure: BREAST LUMPECTOMY WITH NEEDLE LOCALIZATION;  Surgeon: Leonie Green, MD;  Location: [facility];  Service: General;  Laterality: Right;  . CARPAL TUNNEL RELEASE Right   . CATARACT EXTRACTION W/ INTRAOCULAR LENS  IMPLANT, BILATERAL    . EXCISION OF BREAST BIOPSY Right 08/11/2017   excision of radial Scar  . EYE SURGERY    . JOINT REPLACEMENT Right    knee  . KNEE ARTHROSCOPY Right 04/22/2016   Procedure: ARTHROSCOPY KNEE;  Surgeon: Leanor Kail, MD;  Location: [place];  Service: Orthopedics;  Laterality: Right;  . knee replacement Right 01/11/2017  . NASAL SINUS SURGERY  06/05/14   Dr. Pryor Ochoa  . POSTERIOR CERVICAL LAMINECTOMY  2005   C5-7; plate and screws  . RENAL ANGIOGRAPHY Left 04/20/2020   Procedure: RENAL ANGIOGRAPHY;  Surgeon: Algernon Huxley, MD;  Location: [facility];  Service: Cardiovascular;  Laterality: Left;  . TONSILLECTOMY      Family History  Problem Relation Age of Onset  . Ovarian cancer Mother   . Stroke Father        CVA  . Breast cancer Daughter 31  . Breast  cancer Maternal Aunt 60  . Breast cancer Maternal Aunt 90    Allergies  Allergen Reactions  . Chlorhexidine Hives  SAGE wipes caused severe rash  . Cyclobenzaprine Hcl     Doesn't remember reaction  . Etodolac     Doesn't remember reaction  . Lisinopril     Coughing up blood  . Meloxicam     Doesn't remember reaction  . Metaxalone     Doesn't remember reaction  . Neomycin-Bacitracin Zn-Polymyx     Skin irritation  . Nifedipine Swelling  . Nitrofurantoin     Doesn't remember reaction  . Sulfamethoxazole-Trimethoprim Hives  . Sulfonamide Derivatives Hives  . Tizanidine     Liver problems  . Vasotec [Enalapril Maleate]     Doesn't remember reaction  . Ibuprofen Rash  . Tape Rash       Assessment & Plan:   1. Renal artery stenosis (HCC) Recommend:  The patient has evidence of atherosclerotic changes of the renal artery.  At this time the patient's blood pressure is fairly well controlled.  It is slightly elevated today due to some anxiety about the office visit however typically runs in the 130s to 140s.  Patient does not need angiography of the renal artery given the good control of his hypertension.    Patient will continue with antiplatelet therapy.  We will have the patient return to the office with noninvasive studies in 3 months.    2. Essential hypertension Continue antihypertensive medications as already ordered, these medications have been reviewed and there are no changes at this time.   3. Rash and nonspecific skin eruption The patient had a rash started approximately 3 to 4 days ago.  However she has been on the new medications for approximately the last month or so, so it is less likely that it is related to the medications.  The patient is hesitant to start steroids at this time.  Therefore we will have the patient take Claritin daily and apply hydrocortisone cream.  Patient is advised that if this has not started to resolve in the next couple of days we  will order some oral steroids for her.  Current Outpatient Medications on File Prior to Visit  Medication Sig Dispense Refill  . atorvastatin (LIPITOR) 10 MG tablet Take 1 tablet (10 mg total) by mouth daily. 30 tablet 11  . bisoprolol-hydrochlorothiazide (ZIAC) 10-6.25 MG per tablet Take 1 tablet by mouth daily.      . Cholecalciferol (VITAMIN D) 50 MCG (2000 UT) tablet Take 4,000 Units by mouth daily.     . clobetasol ointment (TEMOVATE) 0.16 % Apply 1 application topically 2 (two) times daily as needed for rash.    . clopidogrel (PLAVIX) 75 MG tablet Take 1 tablet (75 mg total) by mouth daily. 30 tablet 11  . hydrALAZINE (APRESOLINE) 50 MG tablet Take 50 mg by mouth 3 (three) times daily.     Marland Kitchen loratadine (CLARITIN) 10 MG tablet Take 10 mg by mouth daily as needed for allergies.     Marland Kitchen losartan (COZAAR) 50 MG tablet Take 50 mg by mouth daily.    . Multiple Vitamin (MULTIVITAMIN) capsule Take 2 capsules by mouth daily. [facility] MVI    . rOPINIRole (REQUIP) 0.25 MG tablet Take 0.5 mg by mouth at bedtime.     No current facility-administered medications on file prior to visit.    There are no Patient Instructions on file for this visit. No follow-ups on file.   Kris Hartmann, NP

## 2020-06-16 DIAGNOSIS — N1831 Chronic kidney disease, stage 3a: Secondary | ICD-10-CM | POA: Diagnosis not present

## 2020-06-16 DIAGNOSIS — Z136 Encounter for screening for cardiovascular disorders: Secondary | ICD-10-CM | POA: Diagnosis not present

## 2020-06-16 DIAGNOSIS — I10 Essential (primary) hypertension: Secondary | ICD-10-CM | POA: Diagnosis not present

## 2020-06-24 ENCOUNTER — Telehealth (INDEPENDENT_AMBULATORY_CARE_PROVIDER_SITE_OTHER): Payer: Self-pay

## 2020-06-24 DIAGNOSIS — I10 Essential (primary) hypertension: Secondary | ICD-10-CM | POA: Diagnosis not present

## 2020-06-24 DIAGNOSIS — E6609 Other obesity due to excess calories: Secondary | ICD-10-CM | POA: Diagnosis not present

## 2020-06-24 DIAGNOSIS — N183 Chronic kidney disease, stage 3 unspecified: Secondary | ICD-10-CM | POA: Diagnosis not present

## 2020-06-24 DIAGNOSIS — Z0000 Encounter for general adult medical examination without abnormal findings: Secondary | ICD-10-CM | POA: Diagnosis not present

## 2020-06-24 NOTE — Telephone Encounter (Signed)
Patient was made aware with medical advice and verbalized she will call back in a week to informed if symptoms have stop or continue.

## 2020-06-24 NOTE — Telephone Encounter (Signed)
Typically diarrhea/abdominal pain is not a common side effect of Plavix.  Has this been going on since 04/20/2020 or has it just started recently? If it just started recently, that is also another indication that it may not be related to the plavix.  We can hold the plavix for a week to see if the symptoms stop, if not, it is likely not related to the plavix and her PCP would need to do a work up to see what may be the source of the pain/increased BM.

## 2020-07-01 ENCOUNTER — Telehealth (INDEPENDENT_AMBULATORY_CARE_PROVIDER_SITE_OTHER): Payer: Self-pay

## 2020-07-01 NOTE — Telephone Encounter (Signed)
Per Julie Ditch NP the patient should start taking Asprin 325mg  daily and the patient has being made aware with medical advice.

## 2020-07-01 NOTE — Telephone Encounter (Signed)
Can we contact Dr. Reeves Forth office to see if they can get her in to work her up.  The diarrhea and pain are not from the plavix and are not expected post procedure. They should see the patient to work her up

## 2020-08-13 ENCOUNTER — Other Ambulatory Visit (INDEPENDENT_AMBULATORY_CARE_PROVIDER_SITE_OTHER): Payer: Self-pay | Admitting: Nurse Practitioner

## 2020-08-13 DIAGNOSIS — I701 Atherosclerosis of renal artery: Secondary | ICD-10-CM

## 2020-08-13 DIAGNOSIS — Z95820 Peripheral vascular angioplasty status with implants and grafts: Secondary | ICD-10-CM

## 2020-08-18 ENCOUNTER — Ambulatory Visit (INDEPENDENT_AMBULATORY_CARE_PROVIDER_SITE_OTHER): Payer: PPO

## 2020-08-18 ENCOUNTER — Ambulatory Visit (INDEPENDENT_AMBULATORY_CARE_PROVIDER_SITE_OTHER): Payer: PPO | Admitting: Vascular Surgery

## 2020-08-18 ENCOUNTER — Other Ambulatory Visit: Payer: Self-pay

## 2020-08-18 DIAGNOSIS — Z95820 Peripheral vascular angioplasty status with implants and grafts: Secondary | ICD-10-CM

## 2020-08-18 DIAGNOSIS — I701 Atherosclerosis of renal artery: Secondary | ICD-10-CM | POA: Diagnosis not present

## 2020-08-20 ENCOUNTER — Encounter (INDEPENDENT_AMBULATORY_CARE_PROVIDER_SITE_OTHER): Payer: Self-pay | Admitting: Vascular Surgery

## 2020-09-09 DIAGNOSIS — J40 Bronchitis, not specified as acute or chronic: Secondary | ICD-10-CM | POA: Diagnosis not present

## 2020-09-09 DIAGNOSIS — R0982 Postnasal drip: Secondary | ICD-10-CM | POA: Diagnosis not present

## 2020-11-09 DIAGNOSIS — D2261 Melanocytic nevi of right upper limb, including shoulder: Secondary | ICD-10-CM | POA: Diagnosis not present

## 2020-11-09 DIAGNOSIS — L821 Other seborrheic keratosis: Secondary | ICD-10-CM | POA: Diagnosis not present

## 2020-11-09 DIAGNOSIS — Z09 Encounter for follow-up examination after completed treatment for conditions other than malignant neoplasm: Secondary | ICD-10-CM | POA: Diagnosis not present

## 2020-11-09 DIAGNOSIS — L400 Psoriasis vulgaris: Secondary | ICD-10-CM | POA: Diagnosis not present

## 2020-11-09 DIAGNOSIS — Z85828 Personal history of other malignant neoplasm of skin: Secondary | ICD-10-CM | POA: Diagnosis not present

## 2020-11-09 DIAGNOSIS — D485 Neoplasm of uncertain behavior of skin: Secondary | ICD-10-CM | POA: Diagnosis not present

## 2020-11-09 DIAGNOSIS — Z872 Personal history of diseases of the skin and subcutaneous tissue: Secondary | ICD-10-CM | POA: Diagnosis not present

## 2020-11-09 DIAGNOSIS — D225 Melanocytic nevi of trunk: Secondary | ICD-10-CM | POA: Diagnosis not present

## 2020-12-25 ENCOUNTER — Encounter: Payer: Self-pay | Admitting: *Deleted

## 2020-12-25 ENCOUNTER — Other Ambulatory Visit: Payer: Self-pay

## 2020-12-25 ENCOUNTER — Emergency Department: Payer: PPO

## 2020-12-25 ENCOUNTER — Inpatient Hospital Stay
Admit: 2020-12-25 | Discharge: 2020-12-25 | Disposition: A | Discharge status: Home / Self Care | Payer: PPO | Attending: Family Medicine | Admitting: Family Medicine

## 2020-12-25 ENCOUNTER — Inpatient Hospital Stay
Admission: EM | Admit: 2020-12-25 | Discharge: 2020-12-30 | DRG: 871 | Disposition: A | Discharge status: Home / Self Care | Payer: PPO | Attending: Hospitalist | Admitting: Hospitalist

## 2020-12-25 DIAGNOSIS — Z79899 Other long term (current) drug therapy: Secondary | ICD-10-CM

## 2020-12-25 DIAGNOSIS — E876 Hypokalemia: Secondary | ICD-10-CM | POA: Diagnosis present

## 2020-12-25 DIAGNOSIS — A419 Sepsis, unspecified organism: Secondary | ICD-10-CM | POA: Diagnosis present

## 2020-12-25 DIAGNOSIS — Z882 Allergy status to sulfonamides status: Secondary | ICD-10-CM | POA: Diagnosis not present

## 2020-12-25 DIAGNOSIS — K219 Gastro-esophageal reflux disease without esophagitis: Secondary | ICD-10-CM | POA: Diagnosis not present

## 2020-12-25 DIAGNOSIS — I248 Other forms of acute ischemic heart disease: Secondary | ICD-10-CM | POA: Diagnosis not present

## 2020-12-25 DIAGNOSIS — I214 Non-ST elevation (NSTEMI) myocardial infarction: Secondary | ICD-10-CM

## 2020-12-25 DIAGNOSIS — I5023 Acute on chronic systolic (congestive) heart failure: Secondary | ICD-10-CM | POA: Diagnosis present

## 2020-12-25 DIAGNOSIS — U071 COVID-19: Secondary | ICD-10-CM | POA: Insufficient documentation

## 2020-12-25 DIAGNOSIS — Z85828 Personal history of other malignant neoplasm of skin: Secondary | ICD-10-CM

## 2020-12-25 DIAGNOSIS — N1831 Chronic kidney disease, stage 3a: Secondary | ICD-10-CM | POA: Diagnosis not present

## 2020-12-25 DIAGNOSIS — R509 Fever, unspecified: Secondary | ICD-10-CM

## 2020-12-25 DIAGNOSIS — Z20822 Contact with and (suspected) exposure to covid-19: Secondary | ICD-10-CM | POA: Diagnosis not present

## 2020-12-25 DIAGNOSIS — R21 Rash and other nonspecific skin eruption: Secondary | ICD-10-CM | POA: Diagnosis not present

## 2020-12-25 DIAGNOSIS — I10 Essential (primary) hypertension: Secondary | ICD-10-CM | POA: Diagnosis present

## 2020-12-25 DIAGNOSIS — Z888 Allergy status to other drugs, medicaments and biological substances status: Secondary | ICD-10-CM | POA: Diagnosis not present

## 2020-12-25 DIAGNOSIS — N179 Acute kidney failure, unspecified: Secondary | ICD-10-CM

## 2020-12-25 DIAGNOSIS — G2581 Restless legs syndrome: Secondary | ICD-10-CM | POA: Diagnosis not present

## 2020-12-25 DIAGNOSIS — Z7902 Long term (current) use of antithrombotics/antiplatelets: Secondary | ICD-10-CM | POA: Diagnosis not present

## 2020-12-25 DIAGNOSIS — I130 Hypertensive heart and chronic kidney disease with heart failure and stage 1 through stage 4 chronic kidney disease, or unspecified chronic kidney disease: Secondary | ICD-10-CM | POA: Diagnosis present

## 2020-12-25 DIAGNOSIS — J157 Pneumonia due to Mycoplasma pneumoniae: Secondary | ICD-10-CM | POA: Diagnosis present

## 2020-12-25 DIAGNOSIS — R778 Other specified abnormalities of plasma proteins: Secondary | ICD-10-CM | POA: Diagnosis not present

## 2020-12-25 DIAGNOSIS — N183 Chronic kidney disease, stage 3 unspecified: Secondary | ICD-10-CM | POA: Diagnosis present

## 2020-12-25 DIAGNOSIS — D696 Thrombocytopenia, unspecified: Secondary | ICD-10-CM | POA: Diagnosis present

## 2020-12-25 DIAGNOSIS — R6520 Severe sepsis without septic shock: Secondary | ICD-10-CM | POA: Diagnosis not present

## 2020-12-25 DIAGNOSIS — R531 Weakness: Secondary | ICD-10-CM | POA: Diagnosis not present

## 2020-12-25 DIAGNOSIS — A4189 Other specified sepsis: Principal | ICD-10-CM | POA: Diagnosis present

## 2020-12-25 DIAGNOSIS — J189 Pneumonia, unspecified organism: Secondary | ICD-10-CM | POA: Diagnosis not present

## 2020-12-25 DIAGNOSIS — Z886 Allergy status to analgesic agent status: Secondary | ICD-10-CM | POA: Diagnosis not present

## 2020-12-25 DIAGNOSIS — Z96651 Presence of right artificial knee joint: Secondary | ICD-10-CM | POA: Diagnosis not present

## 2020-12-25 DIAGNOSIS — Z91048 Other nonmedicinal substance allergy status: Secondary | ICD-10-CM | POA: Diagnosis not present

## 2020-12-25 DIAGNOSIS — E861 Hypovolemia: Secondary | ICD-10-CM | POA: Diagnosis not present

## 2020-12-25 DIAGNOSIS — R0902 Hypoxemia: Secondary | ICD-10-CM | POA: Diagnosis not present

## 2020-12-25 DIAGNOSIS — Z6836 Body mass index (BMI) 36.0-36.9, adult: Secondary | ICD-10-CM

## 2020-12-25 DIAGNOSIS — E785 Hyperlipidemia, unspecified: Secondary | ICD-10-CM | POA: Diagnosis present

## 2020-12-25 DIAGNOSIS — R7989 Other specified abnormal findings of blood chemistry: Secondary | ICD-10-CM | POA: Diagnosis not present

## 2020-12-25 DIAGNOSIS — R5381 Other malaise: Secondary | ICD-10-CM | POA: Diagnosis not present

## 2020-12-25 LAB — RESPIRATORY PANEL BY PCR

## 2020-12-25 LAB — CBC
HCT: 41.2 % (ref 36.0–46.0)
Hemoglobin: 13.7 g/dL (ref 12.0–15.0)
MCH: 30.2 pg (ref 26.0–34.0)
MCHC: 33.3 g/dL (ref 30.0–36.0)
MCV: 90.7 fL (ref 80.0–100.0)
Platelets: 151 K/uL (ref 150–400)
RBC: 4.54 MIL/uL (ref 3.87–5.11)
RDW: 13.6 % (ref 11.5–15.5)
WBC: 10.6 K/uL — ABNORMAL HIGH (ref 4.0–10.5)
nRBC: 0 % (ref 0.0–0.2)

## 2020-12-25 LAB — BASIC METABOLIC PANEL
Anion gap: 14 (ref 5–15)
Anion gap: 11 (ref 5–15)
BUN: 34 mg/dL — ABNORMAL HIGH (ref 8–23)
BUN: 35 mg/dL — ABNORMAL HIGH (ref 8–23)
CO2: 22 mmol/L (ref 22–32)
CO2: 17 mmol/L — ABNORMAL LOW (ref 22–32)
Calcium: 8.2 mg/dL — ABNORMAL LOW (ref 8.9–10.3)
Calcium: 7 mg/dL — ABNORMAL LOW (ref 8.9–10.3)
Chloride: 98 mmol/L (ref 98–111)
Chloride: 107 mmol/L (ref 98–111)
Creatinine, Ser: 1.64 mg/dL — ABNORMAL HIGH (ref 0.44–1.00)
Creatinine, Ser: 1.4 mg/dL — ABNORMAL HIGH (ref 0.44–1.00)
GFR, Estimated: 31 mL/min — ABNORMAL LOW (ref >60)
GFR, Estimated: 38 mL/min — ABNORMAL LOW (ref >60)
Glucose, Bld: 117 mg/dL — ABNORMAL HIGH (ref 70–99)
Glucose, Bld: 122 mg/dL — ABNORMAL HIGH (ref 70–99)
Potassium: 2.9 mmol/L — ABNORMAL LOW (ref 3.5–5.1)
Potassium: 2.8 mmol/L — ABNORMAL LOW (ref 3.5–5.1)
Sodium: 134 mmol/L — ABNORMAL LOW (ref 135–145)
Sodium: 135 mmol/L (ref 135–145)

## 2020-12-25 LAB — PROCALCITONIN: Procalcitonin: 2.59 ng/mL

## 2020-12-25 LAB — URINALYSIS, ROUTINE W REFLEX MICROSCOPIC
Bilirubin Urine: NEGATIVE
Glucose, UA: NEGATIVE mg/dL
Ketones, ur: NEGATIVE mg/dL
Nitrite: POSITIVE — AB
Protein, ur: 30 mg/dL — AB
Specific Gravity, Urine: 1.014 (ref 1.005–1.030)
WBC, UA: >50 WBC/hpf — ABNORMAL HIGH (ref 0–5)
pH: 5 (ref 5.0–8.0)

## 2020-12-25 LAB — TROPONIN I (HIGH SENSITIVITY)
Troponin I (High Sensitivity): 634 ng/L (ref <18)
Troponin I (High Sensitivity): 583 ng/L (ref <18)

## 2020-12-25 LAB — POC SARS CORONAVIRUS 2 AG -  ED: SARS Coronavirus 2 Ag: NEGATIVE

## 2020-12-25 LAB — PROTIME-INR
INR: 1.1 (ref 0.8–1.2)
Prothrombin Time: 14.1 s (ref 11.4–15.2)

## 2020-12-25 LAB — HEPARIN LEVEL (UNFRACTIONATED)
Heparin Unfractionated: 0.17 IU/mL — ABNORMAL LOW (ref 0.30–0.70)
Heparin Unfractionated: 0.6 IU/mL (ref 0.30–0.70)

## 2020-12-25 LAB — LACTIC ACID, PLASMA
Lactic Acid, Venous: 2 mmol/L (ref 0.5–1.9)
Lactic Acid, Venous: 1.7 mmol/L (ref 0.5–1.9)

## 2020-12-25 LAB — APTT: aPTT: 25 seconds (ref 24–36)

## 2020-12-25 LAB — BRAIN NATRIURETIC PEPTIDE: B Natriuretic Peptide: 368.3 pg/mL — ABNORMAL HIGH (ref 0.0–100.0)

## 2020-12-25 LAB — PHOSPHORUS: Phosphorus: 2.4 mg/dL — ABNORMAL LOW (ref 2.5–4.6)

## 2020-12-25 LAB — MAGNESIUM: Magnesium: 1.9 mg/dL (ref 1.7–2.4)

## 2020-12-25 LAB — GLUCOSE, CAPILLARY: Glucose-Capillary: 92 mg/dL (ref 70–99)

## 2020-12-25 LAB — CK: Total CK: 728 U/L — ABNORMAL HIGH (ref 38–234)

## 2020-12-25 LAB — POTASSIUM: Potassium: 3.6 mmol/L (ref 3.5–5.1)

## 2020-12-25 LAB — SARS CORONAVIRUS 2 BY RT PCR (HOSPITAL ORDER, PERFORMED IN ~~LOC~~ HOSPITAL LAB): SARS Coronavirus 2: NEGATIVE

## 2020-12-25 MED ORDER — HEPARIN SODIUM (PORCINE) 5000 UNIT/ML IJ SOLN: 4000.0000 [IU] | Freq: Once | INTRAMUSCULAR | Status: DC

## 2020-12-25 MED ORDER — MULTIVITAMINS PO CAPS: 2.0000 | ORAL_CAPSULE | Freq: Every day | ORAL | Status: DC

## 2020-12-25 MED ORDER — SODIUM CHLORIDE 0.9 % IV BOLUS (SEPSIS)
1000.0000 mL | Freq: Once | INTRAVENOUS | Status: AC
  Administered 2020-12-25: 1000 mL via INTRAVENOUS

## 2020-12-25 MED ORDER — CLOPIDOGREL BISULFATE 75 MG PO TABS
75.0000 mg | ORAL_TABLET | Freq: Every day | ORAL | Status: DC
  Administered 2020-12-25 – 2020-12-30 (×6): 75 mg via ORAL
  Filled 2020-12-25 (×6): qty 1

## 2020-12-25 MED ORDER — IPRATROPIUM-ALBUTEROL 20-100 MCG/ACT IN AERS
1.0000 | INHALATION_SPRAY | Freq: Four times a day (QID) | RESPIRATORY_TRACT | Status: DC
  Administered 2020-12-25 – 2020-12-30 (×20): 1 via RESPIRATORY_TRACT
  Filled 2020-12-25 (×2): qty 4

## 2020-12-25 MED ORDER — LACTATED RINGERS IV SOLN: INTRAVENOUS | Status: DC

## 2020-12-25 MED ORDER — SODIUM CHLORIDE 0.9 % IV BOLUS (SEPSIS): 1000.0000 mL | Freq: Once | INTRAVENOUS | Status: DC

## 2020-12-25 MED ORDER — PERFLUTREN LIPID MICROSPHERE
1.0000 mL | INTRAVENOUS | Status: AC | PRN
  Administered 2020-12-25: 2 mL via INTRAVENOUS
  Filled 2020-12-25: qty 10

## 2020-12-25 MED ORDER — ONDANSETRON HCL 4 MG/2ML IJ SOLN: 4.0000 mg | Freq: Four times a day (QID) | INTRAMUSCULAR | Status: DC | PRN

## 2020-12-25 MED ORDER — HEPARIN BOLUS VIA INFUSION
2300.0000 [IU] | Freq: Once | INTRAVENOUS | Status: AC
  Administered 2020-12-25: 2300 [IU] via INTRAVENOUS
  Filled 2020-12-25: qty 2300

## 2020-12-25 MED ORDER — SODIUM CHLORIDE 0.9 % IV SOLN
1.0000 g | INTRAVENOUS | Status: DC
  Administered 2020-12-25 – 2020-12-26 (×2): 1 g via INTRAVENOUS
  Filled 2020-12-25: qty 1
  Filled 2020-12-25 (×2): qty 10

## 2020-12-25 MED ORDER — VANCOMYCIN VARIABLE DOSE PER UNSTABLE RENAL FUNCTION (PHARMACIST DOSING): Status: DC

## 2020-12-25 MED ORDER — ROPINIROLE HCL 1 MG PO TABS
0.5000 mg | ORAL_TABLET | Freq: Every day | ORAL | Status: DC
Start: 2020-12-25 — End: 2020-12-30
  Administered 2020-12-25 – 2020-12-29 (×5): 0.5 mg via ORAL
  Filled 2020-12-25 (×5): qty 1

## 2020-12-25 MED ORDER — BISOPROLOL-HYDROCHLOROTHIAZIDE 10-6.25 MG PO TABS
1.0000 | ORAL_TABLET | Freq: Every day | ORAL | Status: DC
  Administered 2020-12-25 – 2020-12-28 (×4): 1 via ORAL
  Filled 2020-12-25 (×5): qty 1

## 2020-12-25 MED ORDER — SODIUM CHLORIDE 0.9 % IV SOLN: 2.0000 g | INTRAVENOUS | Status: DC

## 2020-12-25 MED ORDER — ATORVASTATIN CALCIUM 20 MG PO TABS: 10.0000 mg | ORAL_TABLET | Freq: Every day | ORAL | Status: DC

## 2020-12-25 MED ORDER — HYDRALAZINE HCL 50 MG PO TABS
50.0000 mg | ORAL_TABLET | Freq: Three times a day (TID) | ORAL | Status: DC
Start: 2020-12-25 — End: 2020-12-30
  Administered 2020-12-25 – 2020-12-30 (×15): 50 mg via ORAL
  Filled 2020-12-25 (×15): qty 1

## 2020-12-25 MED ORDER — ATORVASTATIN CALCIUM 80 MG PO TABS
80.0000 mg | ORAL_TABLET | Freq: Every day | ORAL | Status: DC
  Administered 2020-12-25: 80 mg via ORAL
  Filled 2020-12-25: qty 4

## 2020-12-25 MED ORDER — ADULT MULTIVITAMIN W/MINERALS CH
1.0000 | ORAL_TABLET | Freq: Every day | ORAL | Status: DC
  Administered 2020-12-25 – 2020-12-30 (×6): 1 via ORAL
  Filled 2020-12-25 (×7): qty 1

## 2020-12-25 MED ORDER — SODIUM CHLORIDE 0.9 % IV SOLN: INTRAVENOUS | Status: DC

## 2020-12-25 MED ORDER — TRAZODONE HCL 50 MG PO TABS
25.0000 mg | ORAL_TABLET | Freq: Every evening | ORAL | Status: DC | PRN
  Administered 2020-12-25 – 2020-12-29 (×5): 25 mg via ORAL
  Filled 2020-12-25 (×5): qty 1

## 2020-12-25 MED ORDER — SODIUM CHLORIDE 0.9 % IV SOLN
2.0000 g | Freq: Once | INTRAVENOUS | Status: AC
  Administered 2020-12-25: 2 g via INTRAVENOUS
  Filled 2020-12-25: qty 2

## 2020-12-25 MED ORDER — LORATADINE 10 MG PO TABS
10.0000 mg | ORAL_TABLET | Freq: Every day | ORAL | Status: DC | PRN
  Administered 2020-12-26: 10 mg via ORAL
  Filled 2020-12-25: qty 1

## 2020-12-25 MED ORDER — POTASSIUM CHLORIDE 20 MEQ PO PACK
40.0000 meq | PACK | ORAL | Status: DC
  Administered 2020-12-25: 40 meq via ORAL
  Filled 2020-12-25 (×2): qty 2

## 2020-12-25 MED ORDER — HEPARIN BOLUS VIA INFUSION
4000.0000 [IU] | Freq: Once | INTRAVENOUS | Status: AC
  Administered 2020-12-25: 4000 [IU] via INTRAVENOUS
  Filled 2020-12-25: qty 4000

## 2020-12-25 MED ORDER — METRONIDAZOLE IN NACL 5-0.79 MG/ML-% IV SOLN
500.0000 mg | Freq: Three times a day (TID) | INTRAVENOUS | Status: DC
  Administered 2020-12-25: 500 mg via INTRAVENOUS
  Filled 2020-12-25: qty 100

## 2020-12-25 MED ORDER — MAGNESIUM HYDROXIDE 400 MG/5ML PO SUSP: 30.0000 mL | Freq: Every day | ORAL | Status: DC | PRN

## 2020-12-25 MED ORDER — SODIUM CHLORIDE 0.9 % IV BOLUS
1000.0000 mL | Freq: Once | INTRAVENOUS | Status: AC
  Administered 2020-12-25: 1000 mL via INTRAVENOUS

## 2020-12-25 MED ORDER — ACETAMINOPHEN 500 MG PO TABS
1000.0000 mg | ORAL_TABLET | Freq: Once | ORAL | Status: AC
  Administered 2020-12-25: 1000 mg via ORAL
  Filled 2020-12-25: qty 2

## 2020-12-25 MED ORDER — ENOXAPARIN SODIUM 40 MG/0.4ML ~~LOC~~ SOLN: 40.0000 mg | SUBCUTANEOUS | Status: DC

## 2020-12-25 MED ORDER — VITAMIN D 25 MCG (1000 UNIT) PO TABS
4000.0000 [IU] | ORAL_TABLET | Freq: Every day | ORAL | Status: DC
  Administered 2020-12-25 – 2020-12-30 (×6): 4000 [IU] via ORAL
  Filled 2020-12-25 (×6): qty 4

## 2020-12-25 MED ORDER — HEPARIN (PORCINE) 25000 UT/250ML-% IV SOLN
1250.0000 [IU]/h | INTRAVENOUS | Status: AC
  Administered 2020-12-25: 03:00:00 1000 [IU]/h via INTRAVENOUS
  Administered 2020-12-25 – 2020-12-26 (×2): 1250 [IU]/h via INTRAVENOUS
  Filled 2020-12-25 (×3): qty 250

## 2020-12-25 MED ORDER — ONDANSETRON HCL 4 MG PO TABS: 4.0000 mg | ORAL_TABLET | Freq: Four times a day (QID) | ORAL | Status: DC | PRN

## 2020-12-25 MED ORDER — VANCOMYCIN HCL IN DEXTROSE 1-5 GM/200ML-% IV SOLN: 1000.0000 mg | Freq: Once | INTRAVENOUS | Status: DC

## 2020-12-25 MED ORDER — LOSARTAN POTASSIUM 50 MG PO TABS: 50.0000 mg | ORAL_TABLET | Freq: Every day | ORAL | Status: DC

## 2020-12-25 MED ORDER — POTASSIUM CHLORIDE 10 MEQ/100ML IV SOLN
10.0000 meq | Freq: Once | INTRAVENOUS | Status: AC
  Administered 2020-12-25: 10 meq via INTRAVENOUS
  Filled 2020-12-25: qty 100

## 2020-12-25 MED ORDER — HEPARIN (PORCINE) 25000 UT/250ML-% IV SOLN: 14.0000 [IU]/kg/h | INTRAVENOUS | Status: DC

## 2020-12-25 MED ORDER — VANCOMYCIN HCL IN DEXTROSE 1-5 GM/200ML-% IV SOLN
1000.0000 mg | Freq: Once | INTRAVENOUS | Status: AC
  Administered 2020-12-25: 1000 mg via INTRAVENOUS
  Filled 2020-12-25: qty 200

## 2020-12-25 MED ORDER — VANCOMYCIN HCL 1250 MG/250ML IV SOLN
1250.0000 mg | Freq: Once | INTRAVENOUS | Status: AC
  Administered 2020-12-25: 1250 mg via INTRAVENOUS
  Filled 2020-12-25: qty 250

## 2020-12-25 MED ORDER — GUAIFENESIN ER 600 MG PO TB12
600.0000 mg | ORAL_TABLET | Freq: Two times a day (BID) | ORAL | Status: DC
  Administered 2020-12-25 – 2020-12-30 (×11): 600 mg via ORAL
  Filled 2020-12-25 (×11): qty 1

## 2020-12-25 MED ORDER — SODIUM CHLORIDE 0.9 % IV SOLN: 2.0000 g | Freq: Once | INTRAVENOUS | Status: DC

## 2020-12-25 MED ORDER — POTASSIUM & SODIUM PHOSPHATES 280-160-250 MG PO PACK
1.0000 | PACK | Freq: Three times a day (TID) | ORAL | Status: AC
  Administered 2020-12-25: 1 via ORAL
  Filled 2020-12-25 (×4): qty 1

## 2020-12-25 MED ORDER — SODIUM CHLORIDE 0.9 % IV SOLN
500.0000 mg | INTRAVENOUS | Status: DC
  Administered 2020-12-25 – 2020-12-29 (×5): 500 mg via INTRAVENOUS
  Filled 2020-12-25 (×5): qty 500

## 2020-12-25 MED ORDER — METRONIDAZOLE IN NACL 5-0.79 MG/ML-% IV SOLN
500.0000 mg | Freq: Once | INTRAVENOUS | Status: DC
  Filled 2020-12-25: qty 100

## 2020-12-25 MED ORDER — POTASSIUM CHLORIDE 10 MEQ/100ML IV SOLN
10.0000 meq | INTRAVENOUS | Status: DC
  Administered 2020-12-25: 10 meq via INTRAVENOUS
  Filled 2020-12-25 (×4): qty 100

## 2020-12-25 MED ORDER — ACETAMINOPHEN 325 MG PO TABS
650.0000 mg | ORAL_TABLET | Freq: Four times a day (QID) | ORAL | Status: DC | PRN
  Administered 2020-12-25 – 2020-12-30 (×5): 650 mg via ORAL
  Filled 2020-12-25 (×6): qty 2

## 2020-12-25 MED ORDER — ACETAMINOPHEN 650 MG RE SUPP
650.0000 mg | Freq: Four times a day (QID) | RECTAL | Status: DC | PRN
  Administered 2020-12-25: 650 mg via RECTAL
  Filled 2020-12-25: qty 1

## 2020-12-25 NOTE — Progress Notes (Signed)
PHARMACY -  BRIEF ANTIBIOTIC NOTE   Pharmacy has received consult(s) for Cefepime and Vancomycin from an ED provider.  The patient's profile has been reviewed for ht/wt/allergies/indication/available labs.    One time order(s) placed for Cefepime 2gm and Vancomycin 2250 mg based on pt wt = 90.7kg.  Further antibiotics/pharmacy consults should be ordered by admitting physician if indicated.                       Renda Rolls, PharmD, [facility] 12/25/2020 3:24 AM

## 2020-12-25 NOTE — Progress Notes (Deleted)
PHARMACIST - PHYSICIAN COMMUNICATION  CONCERNING:  Enoxaparin (Lovenox) for DVT Prophylaxis    RECOMMENDATION: Patient was prescribed enoxaprin 40mg  q24 hours for VTE prophylaxis.   Filed Weights   12/25/20 0018  Weight: 90.7 kg (200 lb)    Body mass index is 33.28 kg/m.  Estimated Creatinine Clearance: 29.4 mL/min (A) (by C-G formula based on SCr of 1.64 mg/dL (H)).  Plan: SCr 1.64, BMI 33.28.  Will continue with Lovenox 40 mg daily at this time.  Pharmacy will continue to follow and will adjust Lovenox to wt based dosing if SCr improves.   Patient is now receiving enoxaparin 40 mg every 24 hours   Renda Rolls, PharmD, [facility] 12/25/2020 5:50 AM

## 2020-12-25 NOTE — ED Notes (Signed)
Patient now with frequent diarrhea, moaning, appears uncomfortable. Febrile, wheezing, tachypneic, sats 90-92% on RA. Placed on 2L [place]. Patient unable to tolerate PO tylenol at this time. Having difficulty sipping from straw, choked once on water. Will attempt PR tylenol. MD notified of patient's condition via secure chat. MD instructs to monitor for now and reports will place more orders soon.

## 2020-12-25 NOTE — ED Provider Notes (Signed)
[facility] Emergency Department Provider Note   ____________________________________________   Event Date/Time   First MD Initiated Contact with Patient 12/25/20 0120     (approximate)  I have reviewed the triage vital signs and the nursing notes.   HISTORY  Chief Complaint Weakness    HPI Julie Mcbride is a 85 y.o. female brought to the ED via EMS from home with a chief complaint of generalized weakness.  Patient complains of generalized weakness and malaise.  States she did an at home Covid test today which was positive.  Patient is vaccinated against COVID-19.  Complains of cough.  Denies chest pain, shortness of breath, abdominal pain, nausea, vomiting or diarrhea.     Past Medical History:  Diagnosis Date  . Arthritis    Knees, hands  . Basal cell carcinoma    back - removed  . Bladder polyp    With previos hematuria  . Diverticulitis   . Dyspnea on exertion    Chronic  . GERD (gastroesophageal reflux disease)   . History of ETT    Myoview 7/10 EF 78%, no ischemia or infarction. Poor exercise tolerance (2'30", stopped due to fatigue). Reached 86% MPHR.  Marland Kitchen Hypertension   . Kidney stones   . Multiple allergies    Chronic  . Palpitations    Holter 7/10 with rare PACs  . Postmenopausal   . S/P hysterectomy   . S/P laminectomy   . 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 COVID-19 12/25/2020  . Vitamin D deficiency 04/03/2020  . Renovascular hypertension 04/03/2020  . Renal artery stenosis ([place]) 04/03/2020  . CKD (chronic kidney disease) stage 3, GFR 30-59 ml/min (HCC) 03/18/2020  . SHORTNESS OF BREATH 06/17/2009  . Essential hypertension 05/27/2009  . PALPITATIONS 05/27/2009  . ABNORMAL EKG 05/27/2009    Past Surgical History:  Procedure Laterality Date  . ABDOMINAL HYSTERECTOMY    . ANKLE FRACTURE SURGERY Right 9/14   Dr. Ulice Dash  . BACK SURGERY    . BASAL CELL CARCINOMA EXCISION     back  . BREAST BIOPSY Right 06/19/2017   Affirm Bx- Radial Scar  . BREAST EXCISIONAL BIOPSY Right 1996   neg  . BREAST LUMPECTOMY WITH NEEDLE LOCALIZATION Right 08/11/2017   Procedure: BREAST LUMPECTOMY WITH NEEDLE LOCALIZATION;  Surgeon: Leonie Green, MD;  Location: [facility] OR[facility];  Service: General;  Laterality: Right;  . CARPAL TUNNEL RELEASE Right   . CATARACT EXTRACTION W/ INTRAOCULAR LENS  IMPLANT, BILATERAL    . EXCISION OF BREAST BIOPSY Right 08/11/2017   excision of radial Scar  . EYE SURGERY    . JOINT REPLACEMENT Right    knee  . KNEE ARTHROSCOPY Right 04/22/2016   Procedure: ARTHROSCOPY KNEE;  Surgeon: Leanor Kail, MD;  Location: [place];  Service: Orthopedics;  Laterality: Right;  . knee replacement Right 01/11/2017  . NASAL SINUS SURGERY  06/05/14   Dr. Pryor Ochoa  . POSTERIOR CERVICAL LAMINECTOMY  2005   C5-7; plate and screws  . RENAL ANGIOGRAPHY Left 04/20/2020   Procedure: RENAL ANGIOGRAPHY;  Surgeon: Algernon Huxley, MD;  Location: [facility];  Service: Cardiovascular;  Laterality: Left;  . TONSILLECTOMY      Prior to Admission medications   Medication Sig Start Date End Date Taking? Authorizing Provider  atorvastatin (LIPITOR) 10 MG tablet Take 1 tablet (10 mg  total) by mouth daily. 04/20/20 04/20/21  Algernon Huxley, MD  bisoprolol-hydrochlorothiazide (ZIAC) 10-6.25 MG per tablet Take 1 tablet by mouth daily.      [provider]  Cholecalciferol (VITAMIN D) 50 MCG (2000 UT) tablet Take 4,000 Units by mouth daily.     [provider]  clobetasol ointment (TEMOVATE) 4.65 % Apply 1 application topically 2 (two) times daily as needed for rash. 06/01/17   [provider]  clopidogrel (PLAVIX) 75 MG tablet Take 1 tablet (75 mg total) by mouth daily. 04/20/20   Algernon Huxley, MD  hydrALAZINE (APRESOLINE) 50 MG tablet Take 50 mg by mouth 3 (three) times daily.     [provider]  loratadine (CLARITIN) 10 MG tablet Take 10 mg by mouth daily as needed for allergies.     [provider]  losartan (COZAAR) 50 MG tablet Take 50 mg by mouth daily.    [provider]  Multiple Vitamin (MULTIVITAMIN) capsule Take 2 capsules by mouth daily. [place] MVI    [provider]  rOPINIRole (REQUIP) 0.25 MG tablet Take 0.5 mg by mouth at bedtime.    [provider]    Allergies Chlorhexidine, Cyclobenzaprine hcl, Etodolac, Lisinopril, Meloxicam, Metaxalone, Neomycin-bacitracin zn-polymyx, Nifedipine, Nitrofurantoin, Sulfamethoxazole-trimethoprim, Sulfonamide derivatives, Tizanidine, Vasotec [enalapril maleate], Ibuprofen, and Tape  Family History  Problem Relation Age of Onset  . Ovarian cancer Mother   . Stroke Father        CVA  . Breast cancer Daughter 27  . Breast cancer Maternal Aunt 60  . Breast cancer Maternal Aunt 90    Social History Social History   Tobacco Use  . Smoking status: Never Smoker  . Smokeless tobacco: Never Used  Vaping Use  . Vaping Use: Never used  Substance Use Topics  . Alcohol use: No  . Drug use: No    Review of Systems  Constitutional: Positive for generalized weakness.  No fever/chills Eyes: No visual changes. ENT: No sore throat. Cardiovascular: Denies chest pain. Respiratory: Positive for cough.  Denies shortness of breath. Gastrointestinal: No abdominal pain.  No nausea, no vomiting.  No diarrhea.  No constipation. Genitourinary: Negative for dysuria. Musculoskeletal: Negative for back pain. Skin: Negative for rash. Neurological: Negative for headaches, focal weakness or numbness.   ____________________________________________   PHYSICAL EXAM:  VITAL SIGNS: ED Triage Vitals  Enc Vitals Group     BP 12/25/20 0022 101/63     Pulse Rate 12/25/20 0022 83     Resp 12/25/20 0022 (!) 26     Temp 12/25/20 0022 (!) 102.4 F (39.1 C)     Temp Source 12/25/20 0022 Oral     SpO2  12/25/20 0022 91 %     Weight 12/25/20 0018 200 lb (90.7 kg)     Height 12/25/20 0018 5\' 5"  (1.651 m)     Head Circumference --      Peak Flow --      Pain Score 12/25/20 0018 0     Pain Loc --      Pain Edu? --      Excl. in [place]? --     Constitutional: Alert and oriented.  Elderly appearing and in mild acute distress. Eyes: Conjunctivae are normal. PERRL. EOMI. Head: Atraumatic. Nose: No congestion/rhinnorhea. Mouth/Throat: Mucous membranes are mildly dry.   Neck: No stridor.   Cardiovascular: Normal rate, regular rhythm. Grossly normal heart sounds.  Good peripheral circulation. Respiratory: Normal respiratory effort.  No retractions. Lungs mildly diminished  bibasilarly. Gastrointestinal: Soft and nontender to light or deep palpation. No distention. No abdominal bruits. No CVA tenderness. Musculoskeletal: No lower extremity tenderness nor edema.  No joint effusions. Neurologic:  Normal speech and language. No gross focal neurologic deficits are appreciated.  Skin:  Skin is warm, dry and intact. No rash noted.  No petechiae. Psychiatric: Mood and affect are normal. Speech and behavior are normal.  ____________________________________________   LABS (all labs ordered are listed, but only abnormal results are displayed)  Labs Reviewed  BASIC METABOLIC PANEL - Abnormal; Notable for the following components:      Result Value   Sodium 134 (*)    Potassium 2.9 (*)    Glucose, Bld 117 (*)    BUN 34 (*)    Creatinine, Ser 1.64 (*)    Calcium 8.2 (*)    GFR, Estimated 31 (*)    All other components within normal limits  CBC - Abnormal; Notable for the following components:   WBC 10.6 (*)    All other components within normal limits  LACTIC ACID, PLASMA - Abnormal; Notable for the following components:   Lactic Acid, Venous 2.0 (*)    All other components within normal limits  TROPONIN I (HIGH SENSITIVITY) - Abnormal; Notable for the following components:   Troponin I (High  Sensitivity) 634 (*)    All other components within normal limits  TROPONIN I (HIGH SENSITIVITY) - Abnormal; Notable for the following components:   Troponin I (High Sensitivity) 583 (*)    All other components within normal limits  CULTURE, BLOOD (ROUTINE X 2)  CULTURE, BLOOD (ROUTINE X 2)  URINE CULTURE  SARS CORONAVIRUS 2 BY RT PCR (HOSPITAL ORDER, [facility])  LACTIC ACID, PLASMA  APTT  PROTIME-INR  PROCALCITONIN  URINALYSIS, ROUTINE W REFLEX MICROSCOPIC  HEPARIN LEVEL (UNFRACTIONATED)  POC SARS CORONAVIRUS 2 AG -  ED   ____________________________________________  EKG  ED ECG REPORT I, Jrake Rodriquez J, the attending physician, personally viewed and interpreted this ECG.   Date: 12/25/2020  EKG Time: 0013  Rate: 89  Rhythm: normal EKG, normal sinus rhythm  Axis: LAD  Intervals:nonspecific intraventricular conduction delay  ST&T Change: Nonspecific  ____________________________________________  RADIOLOGY I, Burnie Hank J, personally viewed and evaluated these images (plain radiographs) as part of my medical decision making, as well as reviewing the written report by the radiologist.  ED MD interpretation: No acute cardiopulmonary process  Official radiology report(s): DG Chest 2 View  Result Date: 12/25/2020 CLINICAL DATA:  Weakness EXAM: CHEST - 2 VIEW COMPARISON:  09/08/2013 FINDINGS: Lungs are well expanded, symmetric, and clear. No pneumothorax or pleural effusion. Cardiac size within normal limits. Pulmonary vascularity is normal. Osseous structures are age-appropriate. No acute bone abnormality. IMPRESSION: No active cardiopulmonary disease. Electronically Signed   By: Fidela Salisbury MD   On: 12/25/2020 01:14    ____________________________________________   PROCEDURES  Procedure(s) performed (including Critical Care):  .1-3 Lead EKG Interpretation Performed by: Paulette Blanch, MD Authorized by: Paulette Blanch, MD     Interpretation:  normal     ECG rate:  85   ECG rate assessment: normal     Rhythm: sinus rhythm     Ectopy: none     Conduction: normal   Comments:     Placed on cardiac monitor to evaluate for arrhythmias    CRITICAL CARE Performed by: Paulette Blanch   Total critical care time: 45 minutes  Critical care time was exclusive of separately  billable procedures and treating other patients.  Critical care was necessary to treat or prevent imminent or life-threatening deterioration.  Critical care was time spent personally by me on the following activities: development of treatment plan with patient and/or surrogate as well as nursing, discussions with consultants, evaluation of patient's response to treatment, examination of patient, obtaining history from patient or surrogate, ordering and performing treatments and interventions, ordering and review of laboratory studies, ordering and review of radiographic studies, pulse oximetry and re-evaluation of patient's condition.  ____________________________________________   INITIAL IMPRESSION / ASSESSMENT AND PLAN / ED COURSE  As part of my medical decision making, I reviewed the following data within the [place] notes reviewed and incorporated, Labs reviewed, EKG interpreted, Old chart reviewed, Radiograph reviewed, Discussed with admitting physician and Notes from prior ED visits     85 year old female presenting with generalized weakness, cough and fever; at home Covid test positive. Differential includes, but is not limited to, viral syndrome, bronchitis including COPD exacerbation, pneumonia, reactive airway disease including asthma, CHF including exacerbation with or without pulmonary/interstitial edema, pneumothorax, ACS, thoracic trauma, and pulmonary embolism.  Patient febrile with laboratory results notable for elevated troponin, elevated lactic acid, hypokalemia, AKI.  Patient does not have results of her home Covid test  with her.  Will obtain Covid swab, start heparin bolus/drip, replete potassium, initiate IV fluid resuscitation.  Will obtain procalcitonin and repeat lactic acid.  Will start antibiotics as necessary.   Clinical Course as of 12/25/20 0528  Fri Dec 25, 2020  0144 Rapid Covid antigen test is NEGATIVE. Lactic acid normalized. Will cover empirically with broad-spectrum IV antibiotics. [JS]    Clinical Course User Index [JS] Paulette Blanch, MD     ____________________________________________   FINAL CLINICAL IMPRESSION(S) / ED DIAGNOSES  Final diagnoses:  Generalized weakness  NSTEMI (non-ST elevated myocardial infarction) ([place])  Fever, unspecified fever cause  Hypokalemia  AKI (acute kidney injury) [facility])     ED Discharge Orders    None      *Please note:  BRYNDLE CORREDOR was evaluated in Emergency Department on 12/25/2020 for the symptoms described in the history of present illness. She was evaluated in the context of the global COVID-19 pandemic, which necessitated consideration that the patient might be at risk for infection with the SARS-CoV-2 virus that causes COVID-19. Institutional protocols and algorithms that pertain to the evaluation of patients at risk for COVID-19 are in a state of rapid change based on information released by regulatory bodies including the CDC and federal and state organizations. These policies and algorithms were followed during the patient's care in the ED.  Some ED evaluations and interventions may be delayed as a result of limited staffing during and the pandemic.*   Note:  This document was prepared using Dragon voice recognition software and may include unintentional dictation errors.   Paulette Blanch, MD 12/25/20 [id number]

## 2020-12-25 NOTE — H&P (Signed)
[facility] [place]   PATIENT NAME: Julie Mcbride    MR#:  [id number]  DATE OF BIRTH:  1936/02/19  DATE OF ADMISSION:  12/25/2020  PRIMARY CARE PHYSICIAN: Dion Body, MD   REQUESTING/REFERRING PHYSICIAN: Lurline Hare, MD  CHIEF COMPLAINT:   Chief Complaint  Patient presents with  . Weakness    HISTORY OF PRESENT ILLNESS:  Julie Mcbride  is a 85 y.o. Caucasian female with a known history of multiple medical problems that are mentioned below, who presented to the emergency room with acute onset of generalized weakness for the last week and recent fever today with associated chills, mild cough without dyspnea or wheezing. She has been having urinary frequency but denies dysuria or oliguria. She has chronic back pain. No rhinorrhea or nasal congestion or sore throat or earache. She admitted to diarrhea yesterday. No recent antibiotics were taken. She denied chest pain or palpitations.  Upon presentation to the emergency room, temperature was 102.4 with respiratory to 26 and pulse currently was 91 and later 95% on room air and otherwise normal vital signs. Labs revealed hypokalemia of 2.9 and BUN of 34 and creatinine 1.6 up from 21 and 0.9 on 08/04/2017. High-sensitivity troponin I was 634 and later 583. Lactic acid was 2 and later 1.7. CBC showed WBC of 10.6. Blood cultures were drawn. Here COVID-19 rapid antigen as well as PCR came back negative. Interestingly the patient had a positive rapid antigen at home but denied any recent exposure. She had 2 vaccine injections and a booster for COVID-19. Chest x-ray showed no acute cardiopulmonary disease. Urinalysis currently pending. EKG showed no sinus rhythm rate of 89 with right axis deviation and nonspecific intraventricular conduction delay as well as minimal voltage cardia for LVH and Q waves inferiorly and anteroseptally..  The patient was empirically given IV cefepime, vancomycin and Flagyl and given elevated troponin now started on IV  heparin. She was also given 20 equivalent of IV potassium chloride and 2 L bolus of IV normal saline, 1 g of p.o. Tylenol was started on IV normal saline at 150 mL/h. She'll be admitted to a medically monitored bed  PAST MEDICAL HISTORY:   Past Medical History:  Diagnosis Date  . Arthritis    Knees, hands  . Basal cell carcinoma    back - removed  . Bladder polyp    With previos hematuria  . Diverticulitis   . Dyspnea on exertion    Chronic  . GERD (gastroesophageal reflux disease)   . History of ETT    Myoview 7/10 EF 78%, no ischemia or infarction. Poor exercise tolerance (2'30", stopped due to fatigue). Reached 86% MPHR.  Marland Kitchen Hypertension   . Kidney stones   . Multiple allergies    Chronic  . Palpitations    Holter 7/10 with rare PACs  . Postmenopausal   . S/P hysterectomy   . S/P laminectomy   . 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 ABDOMINAL HYSTERECTOMY    . ANKLE FRACTURE SURGERY Right 9/14   Dr. Ulice Dash  . BACK SURGERY    . BASAL CELL CARCINOMA EXCISION     back  . BREAST BIOPSY Right 06/19/2017   Affirm Bx- Radial Scar  . BREAST EXCISIONAL BIOPSY Right 1996   neg  . BREAST LUMPECTOMY WITH NEEDLE LOCALIZATION Right 08/11/2017   Procedure: BREAST LUMPECTOMY  WITH NEEDLE LOCALIZATION;  Surgeon: Leonie Green, MD;  Location: [facility];  Service: General;  Laterality: Right;  . CARPAL TUNNEL RELEASE Right   . CATARACT EXTRACTION W/ INTRAOCULAR LENS  IMPLANT, BILATERAL    . EXCISION OF BREAST BIOPSY Right 08/11/2017   excision of radial Scar  . EYE SURGERY    . JOINT REPLACEMENT Right    knee  . KNEE ARTHROSCOPY Right 04/22/2016   Procedure: ARTHROSCOPY KNEE;  Surgeon: Leanor Kail, MD;  Location: [facility];  Service: Orthopedics;  Laterality: Right;  . knee replacement Right 01/11/2017  . NASAL SINUS SURGERY  06/05/14   Dr.  Pryor Ochoa  . POSTERIOR CERVICAL LAMINECTOMY  2005   C5-7; plate and screws  . RENAL ANGIOGRAPHY Left 04/20/2020   Procedure: RENAL ANGIOGRAPHY;  Surgeon: Algernon Huxley, MD;  Location: [facility];  Service: Cardiovascular;  Laterality: Left;  . TONSILLECTOMY      SOCIAL HISTORY:   Social History   Tobacco Use  . Smoking status: Never Smoker  . Smokeless tobacco: Never Used  Substance Use Topics  . Alcohol use: No    FAMILY HISTORY:   Family History  Problem Relation Age of Onset  . Ovarian cancer Mother   . Stroke Father        CVA  . Breast cancer Daughter 32  . Breast cancer Maternal Aunt 60  . Breast cancer Maternal Aunt 90    DRUG ALLERGIES:   Allergies  Allergen Reactions  . Chlorhexidine Hives    SAGE wipes caused severe rash  . Cyclobenzaprine Hcl     Doesn't remember reaction  . Etodolac     Doesn't remember reaction  . Lisinopril     Coughing up blood  . Meloxicam     Doesn't remember reaction  . Metaxalone     Doesn't remember reaction  . Neomycin-Bacitracin Zn-Polymyx     Skin irritation  . Nifedipine Swelling  . Nitrofurantoin     Doesn't remember reaction  . Sulfamethoxazole-Trimethoprim Hives  . Sulfonamide Derivatives Hives  . Tizanidine     Liver problems  . Vasotec [Enalapril Maleate]     Doesn't remember reaction  . Ibuprofen Rash  . Tape Rash    REVIEW OF SYSTEMS:   ROS As per history of present illness. All pertinent systems were reviewed above. Constitutional, HEENT, cardiovascular, respiratory, GI, GU, musculoskeletal, neuro, psychiatric, endocrine, integumentary and hematologic systems were reviewed and are otherwise negative/unremarkable except for positive findings mentioned above in the HPI.   MEDICATIONS AT HOME:   Prior to Admission medications   Medication Sig Start Date End Date Taking? Authorizing Provider  atorvastatin (LIPITOR) 10 MG tablet Take 1 tablet (10 mg total) by mouth daily. 04/20/20 04/20/21  Algernon Huxley, MD  bisoprolol-hydrochlorothiazide (ZIAC) 10-6.25 MG per tablet Take 1 tablet by mouth daily.      [provider]  Cholecalciferol (VITAMIN D) 50 MCG (2000 UT) tablet Take 4,000 Units by mouth daily.     [provider]  clobetasol ointment (TEMOVATE) 5.00 % Apply 1 application topically 2 (two) times daily as needed for rash. 06/01/17   [provider]  clopidogrel (PLAVIX) 75 MG tablet Take 1 tablet (75 mg total) by mouth daily. 04/20/20   Algernon Huxley, MD  hydrALAZINE (APRESOLINE) 50 MG tablet Take 50 mg by mouth 3 (three) times daily.     [provider]  loratadine (CLARITIN) 10 MG tablet Take 10 mg by mouth daily  as needed for allergies.     [provider]  losartan (COZAAR) 50 MG tablet Take 50 mg by mouth daily.    [provider]  Multiple Vitamin (MULTIVITAMIN) capsule Take 2 capsules by mouth daily. [place] MVI    [provider]  rOPINIRole (REQUIP) 0.25 MG tablet Take 0.5 mg by mouth at bedtime.    [provider]      VITAL SIGNS:  Blood pressure (!) 108/50, pulse 72, temperature (!) 102.4 F (39.1 C), temperature source Oral, resp. rate 20, height 5\' 5"  (1.651 m), weight 90.7 kg, SpO2 93 %.  PHYSICAL EXAMINATION:  Physical Exam  GENERAL:  85 y.o.-year-old Caucasian female patient lying in the bed with no acute distress.  EYES: Pupils equal, round, reactive to light and accommodation. No scleral icterus. Extraocular muscles intact.  HEENT: Head atraumatic, normocephalic. Oropharynx and nasopharynx clear.  NECK:  Supple, no jugular venous distention. No thyroid enlargement, no tenderness.  LUNGS: Normal breath sounds bilaterally, no wheezing, rales,rhonchi or crepitation. No use of accessory muscles of respiration.  CARDIOVASCULAR: Regular rate and rhythm, S1, S2 normal. No murmurs, rubs, or gallops.  ABDOMEN: Soft, nondistended, nontender. Bowel sounds present. No organomegaly or mass.  EXTREMITIES:  No pedal edema, cyanosis, or clubbing.  NEUROLOGIC: Cranial nerves II through XII are intact. Muscle strength 5/5 in all extremities. Sensation intact. Gait not checked.  PSYCHIATRIC: The patient is alert and oriented x 3.  Normal affect and good eye contact. SKIN: No obvious rash, lesion, or ulcer.   LABORATORY PANEL:   CBC Recent Labs  Lab 12/25/20 0020  WBC 10.6*  HGB 13.7  HCT 41.2  PLT 151   ------------------------------------------------------------------------------------------------------------------  Chemistries  Recent Labs  Lab 12/25/20 0020  NA 134*  K 2.9*  CL 98  CO2 22  GLUCOSE 117*  BUN 34*  CREATININE 1.64*  CALCIUM 8.2*   ------------------------------------------------------------------------------------------------------------------  Cardiac Enzymes No results for input(s): TROPONINI in the last 168 hours. ------------------------------------------------------------------------------------------------------------------  RADIOLOGY:  DG Chest 2 View  Result Date: 12/25/2020 CLINICAL DATA:  Weakness EXAM: CHEST - 2 VIEW COMPARISON:  09/08/2013 FINDINGS: Lungs are well expanded, symmetric, and clear. No pneumothorax or pleural effusion. Cardiac size within normal limits. Pulmonary vascularity is normal. Osseous structures are age-appropriate. No acute bone abnormality. IMPRESSION: No active cardiopulmonary disease. Electronically Signed   By: Fidela Salisbury MD   On: 12/25/2020 01:14      IMPRESSION AND PLAN:  1. Sepsis of unknown source at this time. -The patient will be admitted to a progressive unit bed -We'll follow blood cultures, urinalysis and urine culture. -We'll follow lactic acid level. -We'll continue IV antibiotic with empiric IV cefepime, vancomycin and Flagyl. -We'll obtain stool studies only if she has recurrent diarrhea. -Her COVID-19 PCR came back negative. -She'll be hydrated with IV normal saline.  2. Acute kidney injury,  likely prerenal due to hypovolemia. -She'll be hydrated with IV normal saline and will follow her BMP.  3. Elevated troponin I. -We will follow serial troponin I's. Patient hasn't had any chest pain. -We'll continue her IV heparin, aspirin and Plavix. -We'll continue beta-blocker therapy with bisoprolol and increase statin dose. -Obtain a 2D echo and cardiology consult. -I notified Dr. Clayborn Bigness about the patient. -I added CK as this could be associated with rhabdomyolysis. Elevated troponin could be related to sepsis and acute kidney injury as well  4. Hypokalemia. -Potassium will be placed and magnesium level will be checked.  5. Essential hypertension. -We'll continue her  hydralazine and bisoprolol without HCTZ and hold off her Cozaar given acute kidney injury.  6. Restless leg syndrome. -We'll continue her Requip.  7. Dyslipidemia. -We'll continue her statin therapy.  8. DVT prophylaxis. -The patient will be on IV heparin.  All the records are reviewed and case discussed with ED provider. The plan of care was discussed in details with the patient (and family). I answered all questions. The patient agreed to proceed with the above mentioned plan. Further management will depend upon hospital course.   CODE STATUS: Full code  Status is: Inpatient  Remains inpatient appropriate because:Ongoing diagnostic testing needed not appropriate for outpatient work up, Unsafe d/c plan, IV treatments appropriate due to intensity of illness or inability to take PO and Inpatient level of care appropriate due to severity of illness   Dispo: The patient is from: Home              Anticipated d/c is to: Home              Anticipated d/c date is: 2 days              Patient currently is not medically stable to d/c.   Difficult to place patient No   TOTAL TIME TAKING CARE OF THIS PATIENT: 55 minutes.    Christel Mormon M.D on 12/25/2020 at 3:52 AM  [facility]   From 7 PM-7 AM,  contact night-coverage [email or web address]  CC: Primary care physician; Dion Body, MD

## 2020-12-25 NOTE — Progress Notes (Signed)
*  PRELIMINARY RESULTS* Echocardiogram 2D Echocardiogram has been performed.  Julie Mcbride 12/25/2020, 3:37 PM

## 2020-12-25 NOTE — Progress Notes (Addendum)
[facility] for Heparin Infusion Indication: ACS/STEMI  Allergies  Allergen Reactions  . Chlorhexidine Hives    SAGE wipes caused severe rash  . Cyclobenzaprine Hcl     Doesn't remember reaction  . Etodolac     Doesn't remember reaction  . Lisinopril     Coughing up blood  . Meloxicam     Doesn't remember reaction  . Metaxalone     Doesn't remember reaction  . Neomycin-Bacitracin Zn-Polymyx     Skin irritation  . Nifedipine Swelling  . Nitrofurantoin     Doesn't remember reaction  . Sulfamethoxazole-Trimethoprim Hives  . Sulfonamide Derivatives Hives  . Tizanidine     Liver problems  . Vasotec [Enalapril Maleate]     Doesn't remember reaction  . Ibuprofen Rash  . Tape Rash    Patient Measurements: Height: 5\' 5"  (165.1 cm) Weight: 90.7 kg (200 lb) IBW/kg (Calculated) : 57 Heparin Dosing Weight: 77.1 kg  Vital Signs: Temp: 102.4 F (39.1 C) (01/28 0022) Temp Source: Oral (01/28 0022) BP: 108/50 (01/28 0233) Pulse Rate: 72 (01/28 0233)  Labs: Recent Labs    12/25/20 0020 12/25/20 0147  HGB 13.7  --   HCT 41.2  --   PLT 151  --   APTT  --  25  LABPROT  --  14.1  INR  --  1.1  CREATININE 1.64*  --   TROPONINIHS 634* 583*    Estimated Creatinine Clearance: 29.4 mL/min (A) (by C-G formula based on SCr of 1.64 mg/dL (H)).   Medical History: Past Medical History:  Diagnosis Date  . Arthritis    Knees, hands  . Basal cell carcinoma    back - removed  . Bladder polyp    With previos hematuria  . Diverticulitis   . Dyspnea on exertion    Chronic  . GERD (gastroesophageal reflux disease)   . History of ETT    Myoview 7/10 EF 78%, no ischemia or infarction. Poor exercise tolerance (2'30", stopped due to fatigue). Reached 86% MPHR.  Marland Kitchen Hypertension   . Kidney stones   . Multiple allergies    Chronic  . Palpitations    Holter 7/10 with rare PACs  . Postmenopausal   . S/P hysterectomy   . S/P laminectomy   .  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  Assessment: Pt is 85 yo female c/o weakness ordered heparin following critical LA = 2.0 and troponin = 634.  Goal of Therapy:  Heparin level 0.3-0.7 units/ml Monitor platelets by anticoagulation protocol: Yes   Plan:  Give 4000 units bolus x 1 Start heparin infusion at 1000 units/hr Check anti-Xa level in 8 hours and daily while on heparin Continue to monitor H&H and platelets  Renda Rolls, PharmD, [facility] 12/25/2020 4:20 AM

## 2020-12-25 NOTE — ED Notes (Signed)
Lab called to report critical lactic and troponin; acuity level changed and charge nurse notified

## 2020-12-25 NOTE — ED Triage Notes (Signed)
Pt brought in via ems from home with weakness.  No chest pain or sob.  No n/v/d.  No headache.  Pt alert  Speech clear.

## 2020-12-25 NOTE — Progress Notes (Signed)
[facility] for Heparin Infusion Indication: ACS/STEMI  Allergies  Allergen Reactions  . Chlorhexidine Hives    SAGE wipes caused severe rash  . Cyclobenzaprine Hcl     Doesn't remember reaction  . Etodolac     Doesn't remember reaction  . Lisinopril     Coughing up blood  . Meloxicam     Doesn't remember reaction  . Metaxalone     Doesn't remember reaction  . Neomycin-Bacitracin Zn-Polymyx     Skin irritation  . Nifedipine Swelling  . Nitrofurantoin     Doesn't remember reaction  . Sulfamethoxazole-Trimethoprim Hives  . Sulfonamide Derivatives Hives  . Tizanidine     Liver problems  . Vasotec [Enalapril Maleate]     Doesn't remember reaction  . Ibuprofen Rash  . Tape Rash    Patient Measurements: Height: 5\' 5"  (165.1 cm) Weight: 90.7 kg (200 lb) IBW/kg (Calculated) : 57 Heparin Dosing Weight: 77.1 kg  Vital Signs: Temp: 99.1 F (37.3 C) (01/28 1200) Temp Source: Oral (01/28 1200) BP: 94/50 (01/28 1130) Pulse Rate: 70 (01/28 1130)  Labs: Recent Labs    12/25/20 0020 12/25/20 0147 12/25/20 0646 12/25/20 0846 12/25/20 1032  HGB 13.7  --   --   --   --   HCT 41.2  --   --   --   --   PLT 151  --   --   --   --   APTT  --  25  --   --   --   LABPROT  --  14.1  --   --   --   INR  --  1.1  --   --   --   HEPARINUNFRC  --   --   --   --  0.17*  CREATININE 1.64*  --   --  1.40*  --   CKTOTAL  --   --  728*  --   --   TROPONINIHS 634* 583*  --   --   --     Estimated Creatinine Clearance: 34.5 mL/min (A) (by C-G formula based on SCr of 1.4 mg/dL (H)).   Medical History: Past Medical History:  Diagnosis Date  . Arthritis    Knees, hands  . Basal cell carcinoma    back - removed  . Bladder polyp    With previos hematuria  . Diverticulitis   . Dyspnea on exertion    Chronic  . GERD (gastroesophageal reflux disease)   . History of ETT    Myoview 7/10 EF 78%, no ischemia or infarction. Poor exercise tolerance  (2'30", stopped due to fatigue). Reached 86% MPHR.  Marland Kitchen Hypertension   . Kidney stones   . Multiple allergies    Chronic  . Palpitations    Holter 7/10 with rare PACs  . Postmenopausal   . S/P hysterectomy   . S/P laminectomy   . 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 Troponin elevated suspected s/t demand ischemia. Pharmacy consult for heparin drip.  Goal of Therapy:  Heparin level 0.3-0.7 units/ml Monitor platelets by anticoagulation protocol: Yes   Plan:  1/28 1032 HL 0.17 subtherapeutic. Heparin 2300 unit bolus followed by increase in heparin drip to 1250 units/hr. Recheck HL at 2100. CBC daily while on heparin drip.  Dorena Bodo, PharmD 12/25/2020 12:25 PM

## 2020-12-25 NOTE — ED Notes (Signed)
Patient states her at home COVID test was negative. Reports she was confused/mistaken earlier, and did not test positive at home.

## 2020-12-25 NOTE — Progress Notes (Signed)
PROGRESS NOTE    MARIELENA HARVELL  UTV:[id number] DOB: 01-03-1936 DOA: 12/25/2020 PCP: Marisue Ivan, MD   Chief complaint: shortness of breath. Brief Narrative:  Julie Mcbride  is a 85 y.o. Caucasian female with a known history of multiple medical problems that are mentioned below, who presented to the emergency room with acute onset of generalized weakness for the last week and recent fever today with associated chills, mild cough without dyspnea or wheezing. She has been having urinary frequency but denies dysuria or oliguria.  She had a home antigen test that was positive for Covid.  But after arriving the hospital, both antigen and PCR test are negative for Covid. Patient has elevated procalcitonin level, treated with cefepime and vancomycin.     Assessment & Plan:   Active Problems:   COVID-19   Sepsis (HCC)  #1.  Sepsis. Community-acquired pneumonia. Skin rash. Elevated troponin secondary to demand ischemia from sepsis. Placed on IV heparin pending cardiology consult for elevated troponin. Patient met sepsis criteria at time of admission, she has evidence of pneumonia, procalcitonin elevated. She also has scattered macular skin rashes, which she was present before she came to the hospital.  Skin rash is not drug-related. Suspect mycoplasma bacterial pneumonia. Patient does not have risk for MRSA, will discontinue vancomycin.  We will add a Zithromax for atypical bacteria such as mycoplasma bacterial pneumonia.  Change cefepime to Rocephin. Patient has some short of breath, but currently does not have hypoxemia.  She has a mild elevation of BNP, pending echocardiogram.  I will discontinue IV fluids.  #2.  Acute kidney injury on chronic kidney disease stage IIIa. Reviewed labs from outside source, she has chronic kidney disease stage IIIa, but a creatinine was 1.1. She received IV fluids, I will discontinue for now.  #3.  Hypokalemia and  hypophosphatemia. Supplement.     DVT prophylaxis: IV heparin Code Status: Full Family Communication: Husband at bedside Disposition Plan:  .   Status is: Inpatient  Remains inpatient appropriate because:Inpatient level of care appropriate due to severity of illness   Dispo: The patient is from: Home              Anticipated d/c is to: Home              Anticipated d/c date is: 3 days              Patient currently is not medically stable to d/c.   Difficult to place patient No        I/O last 3 completed shifts: In: 2200 [IV Piggyback:2200] Out: -  Total I/O In: 1038.6 [I.V.:838.6; IV Piggyback:200] Out: -      Consultants:   Cardiology  Procedures: None  Antimicrobials: Rocephin and Zithromax  Subjective: Patient had a high fever last night, some chills. She has some short of breath today, cough, clear mucus. No chest pain or palpitation. No abdominal pain or nausea vomiting.  She has a poor appetite, she no longer has diarrhea.   Objective: Vitals:   12/25/20 0530 12/25/20 0630 12/25/20 0636 12/25/20 0900  BP: (!) 113/55 126/71  127/83  Pulse: 75 100 (!) 47 79  Resp: (!) 21 (!) 22 (!) 24 (!) 21  Temp:   98 F (36.7 C) 98.1 F (36.7 C)  TempSrc:   Oral Oral  SpO2: 95% 96% 96% 96%  Weight:      Height:        Intake/Output Summary (Last 24 hours) at 12/25/2020 1036  Last data filed at 12/25/2020 1033 Gross per 24 hour  Intake 3238.57 ml  Output --  Net 3238.57 ml   Filed Weights   12/25/20 0018  Weight: 90.7 kg    Examination:  General exam: Appears calm and comfortable  Respiratory system: Clear to auscultation. Respiratory effort normal. Cardiovascular system: S1 & S2 heard, RRR. No JVD, murmurs, rubs, gallops or clicks. No pedal edema. Gastrointestinal system: Abdomen is nondistended, soft and nontender. No organomegaly or masses felt. Normal bowel sounds heard. Central nervous system: Alert and oriented. No focal neurological  deficits. Extremities: Symmetric 5 x 5 power. Skin: Maculopapular skin rash, scattered in the arms and back.  Not itching. Psychiatry: Mood & affect appropriate.     Data Reviewed: I have personally reviewed following labs and imaging studies  CBC: Recent Labs  Lab 12/25/20 0020  WBC 10.6*  HGB 13.7  HCT 41.2  MCV 90.7  PLT 793   Basic Metabolic Panel: Recent Labs  Lab 12/25/20 0020 12/25/20 0846  NA 134* 135  K 2.9* 2.8*  CL 98 107  CO2 22 17*  GLUCOSE 117* 122*  BUN 34* 35*  CREATININE 1.64* 1.40*  CALCIUM 8.2* 7.0*  MG  --  1.9  PHOS  --  2.4*   GFR: Estimated Creatinine Clearance: 34.5 mL/min (A) (by C-G formula based on SCr of 1.4 mg/dL (H)). Liver Function Tests: No results for input(s): AST, ALT, ALKPHOS, BILITOT, PROT, ALBUMIN in the last 168 hours. No results for input(s): LIPASE, AMYLASE in the last 168 hours. No results for input(s): AMMONIA in the last 168 hours. Coagulation Profile: Recent Labs  Lab 12/25/20 0147  INR 1.1   Cardiac Enzymes: Recent Labs  Lab 12/25/20 0646  CKTOTAL 728*   BNP (last 3 results) No results for input(s): PROBNP in the last 8760 hours. HbA1C: No results for input(s): HGBA1C in the last 72 hours. CBG: No results for input(s): GLUCAP in the last 168 hours. Lipid Profile: No results for input(s): CHOL, HDL, LDLCALC, TRIG, CHOLHDL, LDLDIRECT in the last 72 hours. Thyroid Function Tests: No results for input(s): TSH, T4TOTAL, FREET4, T3FREE, THYROIDAB in the last 72 hours. Anemia Panel: No results for input(s): VITAMINB12, FOLATE, FERRITIN, TIBC, IRON, RETICCTPCT in the last 72 hours. Sepsis Labs: Recent Labs  Lab 12/25/20 0027 12/25/20 0147 12/25/20 0319  PROCALCITON  --   --  2.59  LATICACIDVEN 2.0* 1.7  --     Recent Results (from the past 240 hour(s))  Culture, blood (Routine X 2) w Reflex to ID Panel     Status: None (Preliminary result)   Collection Time: 12/25/20 12:27 AM   Specimen: Right  Antecubital; Blood  Result Value Ref Range Status   Specimen Description RIGHT ANTECUBITAL  Final   Special Requests   Final    BOTTLES DRAWN AEROBIC AND ANAEROBIC Blood Culture adequate volume   Culture   Final    NO GROWTH < 12 HOURS Performed at [facility], [address] [place], [place] [address]    Report Status PENDING  Incomplete  Culture, blood (Routine X 2) w Reflex to ID Panel     Status: None (Preliminary result)   Collection Time: 12/25/20  1:46 AM   Specimen: BLOOD  Result Value Ref Range Status   Specimen Description BLOOD LEFT FOREARM  Final   Special Requests   Final    BOTTLES DRAWN AEROBIC AND ANAEROBIC Blood Culture results may not be optimal due to an inadequate volume of  blood received in culture bottles   Culture   Final    NO GROWTH < 12 HOURS Performed at [facility], [address] [place], [place] [address]    Report Status PENDING  Incomplete  SARS Coronavirus 2 by RT PCR (hospital order, performed in [facility] hospital lab) Nasopharyngeal Nasopharyngeal Swab     Status: None   Collection Time: 12/25/20  4:51 AM   Specimen: Nasopharyngeal Swab  Result Value Ref Range Status   SARS Coronavirus 2 NEGATIVE NEGATIVE Final    Comment: (NOTE) SARS-CoV-2 target nucleic acids are NOT DETECTED.  The SARS-CoV-2 RNA is generally detectable in upper and lower respiratory specimens during the acute phase of infection. The lowest concentration of SARS-CoV-2 viral copies this assay can detect is 250 copies / mL. A negative result does not preclude SARS-CoV-2 infection and should not be used as the sole basis for treatment or other patient management decisions.  A negative result may occur with improper specimen collection / handling, submission of specimen other than nasopharyngeal swab, presence of viral mutation(s) within the areas targeted by this assay, and inadequate number of viral copies (<250 copies / mL). A negative result must  be combined with clinical observations, patient history, and epidemiological information.  Fact Sheet for Patients:   StrictlyIdeas.no  Fact Sheet for Healthcare Providers: BankingDealers.co.za  This test is not yet approved or  cleared by the [place] FDA and has been authorized for detection and/or diagnosis of SARS-CoV-2 by FDA under an Emergency Use Authorization (EUA).  This EUA will remain in effect (meaning this test can be used) for the duration of the COVID-19 declaration under Section 564(b)(1) of the Act, 21 U.S.C. section 360bbb-3(b)(1), unless the authorization is terminated or revoked sooner.  Performed at [facility], [address], [place], [place] [address]          Radiology Studies: DG Chest 2 View  Result Date: 12/25/2020 CLINICAL DATA:  Weakness EXAM: CHEST - 2 VIEW COMPARISON:  09/08/2013 FINDINGS: Lungs are well expanded, symmetric, and clear. No pneumothorax or pleural effusion. Cardiac size within normal limits. Pulmonary vascularity is normal. Osseous structures are age-appropriate. No acute bone abnormality. IMPRESSION: No active cardiopulmonary disease. Electronically Signed   By: Fidela Salisbury MD   On: 12/25/2020 01:14        Scheduled Meds: . atorvastatin  80 mg Oral Daily  . bisoprolol-hydrochlorothiazide  1 tablet Oral Daily  . cholecalciferol  4,000 Units Oral Daily  . clopidogrel  75 mg Oral Daily  . guaiFENesin  600 mg Oral BID  . hydrALAZINE  50 mg Oral TID  . multivitamin with minerals  1 tablet Oral Daily  . rOPINIRole  0.5 mg Oral QHS  . vancomycin variable dose per unstable renal function (pharmacist dosing)   Does not apply See admin instructions   Continuous Infusions: . [START ON 12/26/2020] ceFEPime (MAXIPIME) IV    . heparin 1,000 Units/hr (12/25/20 0237)  . metronidazole Stopped (12/25/20 0700)  . potassium chloride       LOS: 0 days    Time spent:      Sharen Hones, MD [facility]   To contact the attending provider between 7A-7P or the covering provider during after hours 7P-7A, please log into the web site [email or web address] and access using universal [place] password for that web site. If you do not have the password, please call the hospital operator.  12/25/2020, 10:36 AM

## 2020-12-25 NOTE — Progress Notes (Addendum)
Secure chatted ED RN for report. RN states waiting for MD to arrive at bedside to assess patient. Patient will not be sent to floor at this time. Will await message from ED RN

## 2020-12-25 NOTE — Progress Notes (Addendum)
Pt unable to to take potassium & sodium phosphates (Phos-nak) 280-160-250 mg packet 1 and potassium chloride (Klor-Con) packet 40 meq packet and complaining of sore throat. MD Mancy made aware. Will notify incoming shift.   Update 0023: MD Mansy states will place order. Will continue to monitor.

## 2020-12-25 NOTE — ED Notes (Signed)
Report handed off to floor nurse

## 2020-12-25 NOTE — ED Notes (Signed)
Took over care of pt. Pt resting comfortably. Pt in NAD at this time. VSS. Awaiting further orders. Will continue to monitor.  

## 2020-12-25 NOTE — Consult Note (Signed)
CARDIOLOGY CONSULT NOTE               Patient ID: Julie Mcbride MRN: [id number] DOB/AGE: 06-May-1936 85 y.o.  Admit date: 12/25/2020 Referring Physician: Christel Mormon, MD.  Primary Physician: Dion Body, MD Primary Cardiologist: none  Reason for Consultation: elevated troponin   HPI: [name] is a 85 year old female with PMH significant for chronic dyspnea upon exertion,HTN, CKD stage III, renal artery stenosis s/p Left renal artery stenting and obesity who present to [facility] with c/o generalized weakness and dyspnea.   ED Course: The patient was febrile and tachypneic. Blood lab work revealed hypokalemia (2.9), elevated BUN/Creat, elevated BNP (634), high sensitivity troponin of 634>>583, CK of 728 and lactic acid of 2.0>>1.7. Covid pcr testing was negative. Patient met sepsis criteria. CXR showed no active cardiopulmonary disease and ECG was unremarkable. The patient was given empiric antibiotics, fluid resuscitation for sepsis and heparin gtt was initiated.    Upon examination, the patient continues to have generalized weakness and states that she does not feel well. The patient states that the symptoms initially started on this past Wednesday when she started having diarrhea and her symptoms become progressively worse over the past few days with worsening upon arrival. The patient denies having any chest pain, but states that she does have chronic dyspnea which seems to have worsened over the past few days. She denies having any associated symptoms and states that the dyspnea is further aggravated by exertion. She denies any MI/CHF history and states that she was told that she had a "heart murmur" in the distant past. The patient denies having any dizziness, palpitation or peripheral edema at this time. Her husband is at the bedside.   Review of systems complete and found to be negative unless listed above   Past Medical History:  Diagnosis Date  . Arthritis     Knees, hands  . Basal cell carcinoma    back - removed  . Bladder polyp    With previos hematuria  . Diverticulitis   . Dyspnea on exertion    Chronic  . GERD (gastroesophageal reflux disease)   . History of ETT    Myoview 7/10 EF 78%, no ischemia or infarction. Poor exercise tolerance (2'30", stopped due to fatigue). Reached 86% MPHR.  Marland Kitchen Hypertension   . Kidney stones   . Multiple allergies    Chronic  . Palpitations    Holter 7/10 with rare PACs  . Postmenopausal   . S/P hysterectomy   . S/P laminectomy   . 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 ABDOMINAL HYSTERECTOMY    . ANKLE FRACTURE SURGERY Right 9/14   Dr. Ulice Dash  . BACK SURGERY    . BASAL CELL CARCINOMA EXCISION     back  . BREAST BIOPSY Right 06/19/2017   Affirm Bx- Radial Scar  . BREAST EXCISIONAL BIOPSY Right 1996   neg  . BREAST LUMPECTOMY WITH NEEDLE LOCALIZATION Right 08/11/2017   Procedure: BREAST LUMPECTOMY WITH NEEDLE LOCALIZATION;  Surgeon: Leonie Green, MD;  Location: [facility];  Service: General;  Laterality: Right;  . CARPAL TUNNEL RELEASE Right   . CATARACT EXTRACTION W/ INTRAOCULAR LENS  IMPLANT, BILATERAL    . EXCISION OF BREAST BIOPSY Right 08/11/2017   excision of radial Scar  . EYE SURGERY    . JOINT REPLACEMENT Right    knee  .  KNEE ARTHROSCOPY Right 04/22/2016   Procedure: ARTHROSCOPY KNEE;  Surgeon: Leanor Kail, MD;  Location: [facility];  Service: Orthopedics;  Laterality: Right;  . knee replacement Right 01/11/2017  . NASAL SINUS SURGERY  06/05/14   Dr. Pryor Ochoa  . POSTERIOR CERVICAL LAMINECTOMY  2005   C5-7; plate and screws  . RENAL ANGIOGRAPHY Left 04/20/2020   Procedure: RENAL ANGIOGRAPHY;  Surgeon: Algernon Huxley, MD;  Location: [facility];  Service: Cardiovascular;  Laterality: Left;  . TONSILLECTOMY      (Not in a hospital admission)  Social History    Socioeconomic History  . Marital status: Married    Spouse name: Not on file  . Number of children: Not on file  . Years of education: Not on file  . Highest education level: Not on file  Occupational History  . Occupation: Retired  Tobacco Use  . Smoking status: Never Smoker  . Smokeless tobacco: Never Used  Vaping Use  . Vaping Use: Never used  Substance and Sexual Activity  . Alcohol use: No  . Drug use: No  . Sexual activity: Not on file  Other Topics Concern  . Not on file  Social History Narrative   Lives with husband (married)   Social Determinants of Health   Financial Resource Strain: Not on file  Food Insecurity: Not on file  Transportation Needs: Not on file  Physical Activity: Not on file  Stress: Not on file  Social Connections: Not on file  Intimate Partner Violence: Not on file    Family History  Problem Relation Age of Onset  . Ovarian cancer Mother   . Stroke Father        CVA  . Breast cancer Daughter 44  . Breast cancer Maternal Aunt 60  . Breast cancer Maternal Aunt 90      Review of systems complete and found to be negative unless listed above   PHYSICAL EXAM  General: Well developed, well nourished, in no acute distress HEENT:  Normocephalic and atraumatic.  Neck:  No JVD. Negative for rigidity.  Lungs: clear in upper lobes, diminished to lower lobes bilaterally to auscultation. Chest expansion symmetrical.  Heart: HRRR . Normal S1 and S2 without gallops or murmurs.  Abdomen: Bowel sounds are positive, abdomen soft and non-tender  Msk:   Normal tone for age. Generalized weakness.  Extremities: No clubbing, cyanosis or edema.   Neuro: Alert and oriented X 3. Psych:  Good affect, responds appropriately  Labs:   Lab Results  Component Value Date   WBC 10.6 (H) 12/25/2020   HGB 13.7 12/25/2020   HCT 41.2 12/25/2020   MCV 90.7 12/25/2020   PLT 151 12/25/2020    Recent Labs  Lab 12/25/20 0846  NA 135  K 2.8*  CL 107  CO2  17*  BUN 35*  CREATININE 1.40*  CALCIUM 7.0*  GLUCOSE 122*   Lab Results  Component Value Date   VZDGLOV 564 (H) 12/25/2020   CKMB 2.5 09/08/2013   TROPONINI < 0.02 09/08/2013   No results found for: CHOL No results found for: HDL No results found for: LDLCALC No results found for: TRIG No results found for: CHOLHDL No results found for: LDLDIRECT    Radiology: DG Chest 2 View  Result Date: 12/25/2020 CLINICAL DATA:  Weakness EXAM: CHEST - 2 VIEW COMPARISON:  09/08/2013 FINDINGS: Lungs are well expanded, symmetric, and clear. No pneumothorax or pleural effusion. Cardiac size within normal limits. Pulmonary vascularity is normal. Osseous  structures are age-appropriate. No acute bone abnormality. IMPRESSION: No active cardiopulmonary disease. Electronically Signed   By: Fidela Salisbury MD   On: 12/25/2020 01:14    EKG: SR   ASSESSMENT AND PLAN:  [name] is a 85 year old female with PMH significant for chronic dyspnea upon exertion,HTN, CKD stage III, renal artery stenosis s/p Left renal artery stenting and obesity who present to [facility] with c/o generalized weakness and dyspnea. The patient high sensitivity troponin is elevated at 634>>583 which is likely due to demand ischemia in the presence of a BNP elevated at 634 and CK elevated at 728. Although there is low suspicion for ACS at this time, this diagnosis cannot be excluded at this point. The patient does appear euvolemic and does not have any noticeable peripheral edema at this time. She is severely hypokalemic which warrants a high suspicion for rhabdomyolysis. The patient is also meet sepsis criteria which is also contributing to the increased cardiac oxygen demand/supply imbalance. Further noninasive cardiac testing, Echocardiogram, is recommended at this time.    1. Elevated troponin, likely due to demand ischemia, fairly stable  -Trend troponin.  -Agree with heparin gtt at this time.   -Echocardiogram pending.    -heart healthy diet.   -Agree with admission with continuous telemetry monitoring.   -continue beta blocker and statin therapy.  -consider inpatient stress test once electrolyte imbalances have been corrected and all viral panels have resulted.   2. Sepsis likely due to CAP, fairly stable at this time, lactic acid level are at 1.7 now  -Agree with empiric antibiotic therapy.  -Blood and urine cultures are pending.  -Viral panel pending.   -Recommend gentle hydration to prevent hypervolemia.   3. Chronic dyspnea with worsening, unclear etiology, fairly stable, patient's on room air with O2 saturations above 93%  -Echocardiogram pending to rule out CHF.   -Respiratory consult if needed.   -Recommend continuous pulse oximetry monitoring.   4. AKI in the presence of CKD Stage III, fairly stable  -gentle hydration recommended.   -trend CMP.  -Recommend Nephrology consult if needed.   5. Electroltye imbalances, fairly stable  -Agree with potassium and electroylte replacement.   -continuous telemetry monitiring.   6. HTN, reasonably controlled  -continue beta blocker therapy.  -low sodium diet.  -vs q1h per protocol   7. Dyslipidemia, fairly controlled  -continue statin thearpy.   8. S/p Left renal artery stenting, stable  -continue ASA and plavix therapy.   -Recommend vascular consult if needed.      Signed: Zyree Traynham ACNPC-AG 12/25/2020, 12:12 PM

## 2020-12-25 NOTE — Progress Notes (Signed)
Pharmacy Antibiotic Note  Julie Mcbride is a 85 y.o. female admitted on 12/25/2020 with sepsis of unknown source.  Pharmacy has been consulted for Cefepime and Vancomcyin dosing.  Pt is also ordered Flagyl IV 500mg  q8h.  Plan: Ordered Cefepime 2 gm q24h per indication and current CrCl.  Pharmacy will continue to follow SCr and adjust dose if warranted.    Pt ordered Vancomycin 2250 mg loading dose based on wt in ED at 0640.  Will plan to dose vancomycin per levels due to unstable renal function from possible AKI, given last known SCr of 1.02 in May 2021.  Pharmacy will continue to follow pt SCr and order Vanc levels and/or additional doses as appropriate.  Will transition to AUC dosing if pt SCr stabilizes.   Height: 5\' 5"  (165.1 cm) Weight: 90.7 kg (200 lb) IBW/kg (Calculated) : 57  Temp (24hrs), Avg:100.2 F (37.9 C), Min:98 F (36.7 C), Max:102.4 F (39.1 C)  Recent Labs  Lab 12/25/20 0020 12/25/20 0027 12/25/20 0147  WBC 10.6*  --   --   CREATININE 1.64*  --   --   LATICACIDVEN  --  2.0* 1.7    Estimated Creatinine Clearance: 29.4 mL/min (A) (by C-G formula based on SCr of 1.64 mg/dL (H)).    Allergies  Allergen Reactions  . Chlorhexidine Hives    SAGE wipes caused severe rash  . Cyclobenzaprine Hcl     Doesn't remember reaction  . Etodolac     Doesn't remember reaction  . Lisinopril     Coughing up blood  . Meloxicam     Doesn't remember reaction  . Metaxalone     Doesn't remember reaction  . Neomycin-Bacitracin Zn-Polymyx     Skin irritation  . Nifedipine Swelling  . Nitrofurantoin     Doesn't remember reaction  . Sulfamethoxazole-Trimethoprim Hives  . Sulfonamide Derivatives Hives  . Tizanidine     Liver problems  . Vasotec [Enalapril Maleate]     Doesn't remember reaction  . Ibuprofen Rash  . Tape Rash    Antimicrobials this admission: 01/28 Vanc >>  01/28 Cefepime >>  01/28 Metronidazole >>  Microbiology results: 01/28 BCx: Pending 01/28  UCx: Pending  01/28 Sputum: Pending   Thank you for allowing pharmacy to be a part of this patient's care.  Renda Rolls, PharmD, [facility] 12/25/2020 7:45 AM

## 2020-12-26 DIAGNOSIS — R6520 Severe sepsis without septic shock: Secondary | ICD-10-CM

## 2020-12-26 DIAGNOSIS — A419 Sepsis, unspecified organism: Secondary | ICD-10-CM

## 2020-12-26 LAB — ECHOCARDIOGRAM COMPLETE
AR max vel: 2.45 cm2
AV Area VTI: 2.67 cm2
AV Area mean vel: 2.34 cm2
AV Mean grad: 5 mmHg
AV Peak grad: 9 mmHg
Ao pk vel: 1.5 m/s
Area-P 1/2: 3.33 cm2
Calc EF: 47.8 %
Height: 65 in
MV VTI: 1.94 cm2
S' Lateral: 3 cm
Single Plane A2C EF: 51.2 %
Single Plane A4C EF: 43.9 %
Weight: 3200 oz

## 2020-12-26 LAB — PROTIME-INR
INR: 1.2 (ref 0.8–1.2)
Prothrombin Time: 14.8 seconds (ref 11.4–15.2)

## 2020-12-26 LAB — CBC
HCT: 37.5 % (ref 36.0–46.0)
Hemoglobin: 12.2 g/dL (ref 12.0–15.0)
MCH: 30 pg (ref 26.0–34.0)
MCHC: 32.5 g/dL (ref 30.0–36.0)
MCV: 92.4 fL (ref 80.0–100.0)
Platelets: 135 10*3/uL — ABNORMAL LOW (ref 150–400)
RBC: 4.06 MIL/uL (ref 3.87–5.11)
RDW: 14 % (ref 11.5–15.5)
WBC: 8 10*3/uL (ref 4.0–10.5)
nRBC: 0 % (ref 0.0–0.2)

## 2020-12-26 LAB — CORTISOL-AM, BLOOD: Cortisol - AM: 23.8 ug/dL — ABNORMAL HIGH (ref 6.7–22.6)

## 2020-12-26 LAB — BASIC METABOLIC PANEL
Anion gap: 10 (ref 5–15)
BUN: 34 mg/dL — ABNORMAL HIGH (ref 8–23)
CO2: 17 mmol/L — ABNORMAL LOW (ref 22–32)
Calcium: 7.5 mg/dL — ABNORMAL LOW (ref 8.9–10.3)
Chloride: 110 mmol/L (ref 98–111)
Creatinine, Ser: 1.27 mg/dL — ABNORMAL HIGH (ref 0.44–1.00)
GFR, Estimated: 42 mL/min — ABNORMAL LOW (ref >60)
Glucose, Bld: 83 mg/dL (ref 70–99)
Potassium: 3.8 mmol/L (ref 3.5–5.1)
Sodium: 137 mmol/L (ref 135–145)

## 2020-12-26 LAB — URINE CULTURE: Culture: <10000 — AB

## 2020-12-26 LAB — MAGNESIUM: Magnesium: 2.2 mg/dL (ref 1.7–2.4)

## 2020-12-26 LAB — PROCALCITONIN: Procalcitonin: 2.17 ng/mL

## 2020-12-26 LAB — HEPARIN LEVEL (UNFRACTIONATED): Heparin Unfractionated: 0.58 IU/mL (ref 0.30–0.70)

## 2020-12-26 MED ORDER — MENTHOL 3 MG MT LOZG
1.0000 | LOZENGE | OROMUCOSAL | Status: DC | PRN
  Administered 2020-12-27 (×2): 3 mg via ORAL
  Filled 2020-12-26: qty 9

## 2020-12-26 MED ORDER — FUROSEMIDE 10 MG/ML IJ SOLN
40.0000 mg | Freq: Once | INTRAMUSCULAR | Status: AC
  Administered 2020-12-26: 40 mg via INTRAVENOUS
  Filled 2020-12-26: qty 4

## 2020-12-26 MED ORDER — ENSURE ENLIVE PO LIQD
237.0000 mL | Freq: Two times a day (BID) | ORAL | Status: DC
  Administered 2020-12-26 – 2020-12-30 (×7): 237 mL via ORAL

## 2020-12-26 MED ORDER — POTASSIUM CHLORIDE 20 MEQ PO PACK: 40.0000 meq | PACK | Freq: Once | ORAL | Status: DC

## 2020-12-26 MED ORDER — POTASSIUM CHLORIDE CRYS ER 20 MEQ PO TBCR
40.0000 meq | EXTENDED_RELEASE_TABLET | Freq: Once | ORAL | Status: AC
  Administered 2020-12-26: 40 meq via ORAL
  Filled 2020-12-26: qty 2

## 2020-12-26 NOTE — Plan of Care (Signed)
  Problem: Health Behavior/Discharge Planning: Goal: Ability to manage health-related needs will improve Outcome: Progressing   Problem: Clinical Measurements: Goal: Will remain free from infection Outcome: Progressing   Problem: Clinical Measurements: Goal: Respiratory complications will improve Outcome: Progressing   Problem: Clinical Measurements: Goal: Cardiovascular complication will be avoided Outcome: Progressing   Problem: Safety: Goal: Ability to remain free from injury will improve Outcome: Progressing   

## 2020-12-26 NOTE — Progress Notes (Signed)
Initial Nutrition Assessment  DOCUMENTATION CODES:   Not applicable  INTERVENTION:   Liberalize diet to REGULAR   Ensure Enlive po TID, each supplement provides 350 kcal and 20 grams of protein  MVI daily  NUTRITION DIAGNOSIS:   Inadequate oral intake related to decreased appetite as evidenced by per patient/family report.  GOAL:   Patient will meet greater than or equal to 90% of their needs  MONITOR:   PO intake,Supplement acceptance,Weight trends,Labs,I & O's  REASON FOR ASSESSMENT:   Malnutrition Screening Tool    ASSESSMENT:   Patient with PMH significant for diverticulitis, GERD, and HTN. Presents this admission with CAP and AKI.   Patient endorses a loss in appetite over the last two days. During this time she consumed sips and bites. Prior to this she was eating normal three meals daily that consisted B- bacon, eggs L- PB crackers, protein bar D- chicken pie. She does not use supplementation. Last meal completion charted as 25%. Discussed the importance of protein intake for preservation of lean body mass. Patient willing to drink Ensure.   Weight noted to decline from 102.5 kg on 05/19/20 to 99.8 kg this admission (2.6% wt loss in 7 months, insignificant for time frame).   Medications: 40 mg lasix Labs: CBG 83-122  Diet Order:   Diet Order            Diet Heart Room service appropriate? Yes; Fluid consistency: Thin  Diet effective now                 EDUCATION NEEDS:   Education needs have been addressed  Skin:  Skin Assessment: Reviewed RN Assessment  Last BM:  1/28  Height:   Ht Readings from Last 1 Encounters:  12/25/20 5\' 5"  (1.651 m)    Weight:   Wt Readings from Last 1 Encounters:  12/26/20 99.8 kg    BMI:  Body mass index is 36.61 kg/m.  Estimated Nutritional Needs:   Kcal:  1800-2000 kcal  Protein:  90-105 grams  Fluid:  >/= 1.8 L/day  Julie Mcbride RD, LDN Clinical Nutrition Pager listed in [place]

## 2020-12-26 NOTE — Progress Notes (Signed)
PROGRESS NOTE    Julie Mcbride  ZOX:[id number] DOB: Feb 03, 1936 DOA: 12/25/2020 PCP: Julie IvanLinthavong, [place], MD   Chief complaint. Shortness of breath Brief Narrative:  [name] a84 y.o.Caucasian femalewith a known history of multiple medical problems that are mentioned below, who presented to the emergency room with acute onset of generalized weakness for the last week and recent fever today with associated chills, mild cough without dyspnea or wheezing. She has been having urinary frequency but denies dysuria or oliguria.  She had a home antigen test that was positive for Covid.  But after arriving the hospital, both antigen and PCR test are negative for Covid. Patient has elevated procalcitonin level, treated with cefepime and vancomycin.    Assessment & Plan:   Active Problems:   COVID-19   Sepsis (HCC)   Community acquired pneumonia  #1. Sepsis. Community-acquired pneumonia likely secondary to mycoplasma bacteria. Skin rash likely due to mycoplasma bacteria. Elevated troponin secondary to demand ischemia secondary to sepsis. Patient condition is stable. On 2 L oxygen for comfort, she did not have hypoxemia. Culture negative. Covid negative. Respiratory flora negative. With skin rash, pneumonia, condition is consistent with mycoplasma pneumonia. Continue antibiotics with Rocephin and Zithromax. Discontinue heparin drip after 48 hours. Obtain physical therapy/Occupational Therapy for general weakness. Patient and husband clearly states that they do not want SNF no matter what.  #2.. Shortness of breath Likely due to pneumonia. Mildly volume overload. Pending echocardiogram. Will give one dose IV Lasix.  3. Acute kidney injury on chronic kidney disease stage IIIa. Renal function improving.  #4. Hypokalemia and hypophosphatemia. Improved.    DVT prophylaxis: IV heparin Code Status: Full Family Communication: Husband updated at bedside Disposition Plan:   .   Status is: Inpatient  Remains inpatient appropriate because:Inpatient level of care appropriate due to severity of illness   Dispo: The patient is from: Home              Anticipated d/c is to: Home              Anticipated d/c date is: 2 days              Patient currently is not medically stable to d/c.   Difficult to place patient No        I/O last 3 completed shifts: In: 3404.8 [I.V.:904.8; IV Piggyback:2500] Out: -  Total I/O In: 240 [P.O.:240] Out: -      Consultants:   Cardiology  Procedures: None  Antimicrobials:  Rocephin and Zithromax Subjective: Patient still has significant short of breath with exertion. Currently on 2 L oxygen. Significant weakness. Cough with clear mucus. No fever chills  No abdominal pain or nausea vomiting.   Objective: Vitals:   12/26/20 0500 12/26/20 0544 12/26/20 0817 12/26/20 1135  BP:  (!) 111/49 (!) 113/46 (!) 112/59  Pulse:  (!) 35 85 100  Resp:  (!) 21 20 (!) 21  Temp: 98.3 F (36.8 C) 98.3 F (36.8 C) 97.9 F (36.6 C) 98.2 F (36.8 C)  TempSrc:  Oral Oral Oral  SpO2:  97% 98% 97%  Weight:      Height:        Intake/Output Summary (Last 24 hours) at 12/26/2020 1311 Last data filed at 12/26/2020 1138 Gross per 24 hour  Intake 306.27 ml  Output --  Net 306.27 ml   Filed Weights   12/25/20 0018 12/25/20 2204 12/26/20 0400  Weight: 90.7 kg 99.8 kg 99.8 kg    Examination:  General  exam: Appears calm and comfortable  Respiratory system: Fine crackles in the base bilaterally. Respiratory effort normal. Cardiovascular system: S1 & S2 heard, RRR. No JVD, murmurs, rubs, gallops or clicks. No pedal edema. Gastrointestinal system: Abdomen is nondistended, soft and nontender. No organomegaly or masses felt. Normal bowel sounds heard. Central nervous system: Alert and oriented. No focal neurological deficits. Extremities: Symmetric 5 x 5 power. Skin: No rashes, lesions or ulcers Psychiatry:  Mood & affect  appropriate.     Data Reviewed: I have personally reviewed following labs and imaging studies  CBC: Recent Labs  Lab 12/25/20 0020 12/26/20 0446  WBC 10.6* 8.0  HGB 13.7 12.2  HCT 41.2 37.5  MCV 90.7 92.4  PLT 151 606*   Basic Metabolic Panel: Recent Labs  Lab 12/25/20 0020 12/25/20 0846 12/25/20 1805 12/26/20 0446  NA 134* 135  --  137  K 2.9* 2.8* 3.6 3.8  CL 98 107  --  110  CO2 22 17*  --  17*  GLUCOSE 117* 122*  --  83  BUN 34* 35*  --  34*  CREATININE 1.64* 1.40*  --  1.27*  CALCIUM 8.2* 7.0*  --  7.5*  MG  --  1.9  --  2.2  PHOS  --  2.4*  --   --    GFR: Estimated Creatinine Clearance: 40 mL/min (A) (by C-G formula based on SCr of 1.27 mg/dL (H)). Liver Function Tests: No results for input(s): AST, ALT, ALKPHOS, BILITOT, PROT, ALBUMIN in the last 168 hours. No results for input(s): LIPASE, AMYLASE in the last 168 hours. No results for input(s): AMMONIA in the last 168 hours. Coagulation Profile: Recent Labs  Lab 12/25/20 0147 12/26/20 0446  INR 1.1 1.2   Cardiac Enzymes: Recent Labs  Lab 12/25/20 0646  CKTOTAL 728*   BNP (last 3 results) No results for input(s): PROBNP in the last 8760 hours. HbA1C: No results for input(s): HGBA1C in the last 72 hours. CBG: Recent Labs  Lab 12/25/20 2207  GLUCAP 92   Lipid Profile: No results for input(s): CHOL, HDL, LDLCALC, TRIG, CHOLHDL, LDLDIRECT in the last 72 hours. Thyroid Function Tests: No results for input(s): TSH, T4TOTAL, FREET4, T3FREE, THYROIDAB in the last 72 hours. Anemia Panel: No results for input(s): VITAMINB12, FOLATE, FERRITIN, TIBC, IRON, RETICCTPCT in the last 72 hours. Sepsis Labs: Recent Labs  Lab 12/25/20 0027 12/25/20 0147 12/25/20 0319 12/26/20 0446  PROCALCITON  --   --  2.59 2.17  LATICACIDVEN 2.0* 1.7  --   --     Recent Results (from the past 240 hour(s))  Culture, blood (Routine X 2) w Reflex to ID Panel     Status: None (Preliminary result)   Collection Time:  12/25/20 12:27 AM   Specimen: Right Antecubital; Blood  Result Value Ref Range Status   Specimen Description RIGHT ANTECUBITAL  Final   Special Requests   Final    BOTTLES DRAWN AEROBIC AND ANAEROBIC Blood Culture adequate volume   Culture   Final    NO GROWTH 1 DAY Performed at [facility], [address], [place], [place] [address]    Report Status PENDING  Incomplete  Culture, blood (Routine X 2) w Reflex to ID Panel     Status: None (Preliminary result)   Collection Time: 12/25/20  1:46 AM   Specimen: BLOOD  Result Value Ref Range Status   Specimen Description BLOOD LEFT FOREARM  Final   Special Requests   Final  BOTTLES DRAWN AEROBIC AND ANAEROBIC Blood Culture results may not be optimal due to an inadequate volume of blood received in culture bottles   Culture   Final    NO GROWTH 1 DAY Performed at [facility], [address] [place], [place] [address]    Report Status PENDING  Incomplete  SARS Coronavirus 2 by RT PCR (hospital order, performed in [facility] hospital lab) Nasopharyngeal Nasopharyngeal Swab     Status: None   Collection Time: 12/25/20  4:51 AM   Specimen: Nasopharyngeal Swab  Result Value Ref Range Status   SARS Coronavirus 2 NEGATIVE NEGATIVE Final    Comment: (NOTE) SARS-CoV-2 target nucleic acids are NOT DETECTED.  The SARS-CoV-2 RNA is generally detectable in upper and lower respiratory specimens during the acute phase of infection. The lowest concentration of SARS-CoV-2 viral copies this assay can detect is 250 copies / mL. A negative result does not preclude SARS-CoV-2 infection and should not be used as the sole basis for treatment or other patient management decisions.  A negative result may occur with improper specimen collection / handling, submission of specimen other than nasopharyngeal swab, presence of viral mutation(s) within the areas targeted by this assay, and inadequate number of viral copies (<250 copies /  mL). A negative result must be combined with clinical observations, patient history, and epidemiological information.  Fact Sheet for Patients:   StrictlyIdeas.no  Fact Sheet for Healthcare Providers: BankingDealers.co.za  This test is not yet approved or  cleared by the [place] FDA and has been authorized for detection and/or diagnosis of SARS-CoV-2 by FDA under an Emergency Use Authorization (EUA).  This EUA will remain in effect (meaning this test can be used) for the duration of the COVID-19 declaration under Section 564(b)(1) of the Act, 21 U.S.C. section 360bbb-3(b)(1), unless the authorization is terminated or revoked sooner.  Performed at [facility], [address], [place], [place] [address]   Urine Culture     Status: Abnormal   Collection Time: 12/25/20  6:15 AM   Specimen: Urine, Clean Catch  Result Value Ref Range Status   Specimen Description   Final    URINE, CLEAN CATCH Performed at [facility], [address], [place], [place] [address]    Special Requests   Final    NONE Performed at [facility], [address] [place], [place] [address]    Culture (A)  Final    <10,000 COLONIES/mL INSIGNIFICANT GROWTH Performed at [facility] [address] [place], [place] [address]    Report Status 12/26/2020 FINAL  Final  Respiratory (~20 pathogens) panel by PCR     Status: None   Collection Time: 12/25/20 12:21 PM   Specimen: Nasopharyngeal Swab; Respiratory  Result Value Ref Range Status   Adenovirus NOT DETECTED NOT DETECTED Final   Coronavirus 229E NOT DETECTED NOT DETECTED Final    Comment: (NOTE) The Coronavirus on the Respiratory Panel, DOES NOT test for the novel  Coronavirus (2019 nCoV)    Coronavirus HKU1 NOT DETECTED NOT DETECTED Final   Coronavirus NL63 NOT DETECTED NOT DETECTED Final   Coronavirus OC43 NOT DETECTED NOT DETECTED Final   Metapneumovirus NOT  DETECTED NOT DETECTED Final   Rhinovirus / Enterovirus NOT DETECTED NOT DETECTED Final   Influenza A NOT DETECTED NOT DETECTED Final   Influenza B NOT DETECTED NOT DETECTED Final   Parainfluenza Virus 1 NOT DETECTED NOT DETECTED Final   Parainfluenza Virus 2 NOT DETECTED NOT  DETECTED Final   Parainfluenza Virus 3 NOT DETECTED NOT DETECTED Final   Parainfluenza Virus 4 NOT DETECTED NOT DETECTED Final   Respiratory Syncytial Virus NOT DETECTED NOT DETECTED Final   Bordetella pertussis NOT DETECTED NOT DETECTED Final   Bordetella Parapertussis NOT DETECTED NOT DETECTED Final   Chlamydophila pneumoniae NOT DETECTED NOT DETECTED Final   Mycoplasma pneumoniae NOT DETECTED NOT DETECTED Final    Comment: Performed at [facility], [place] [address], [place], [place] [address]         Radiology Studies: DG Chest 2 View  Result Date: 12/25/2020 CLINICAL DATA:  Weakness EXAM: CHEST - 2 VIEW COMPARISON:  09/08/2013 FINDINGS: Lungs are well expanded, symmetric, and clear. No pneumothorax or pleural effusion. Cardiac size within normal limits. Pulmonary vascularity is normal. Osseous structures are age-appropriate. No acute bone abnormality. IMPRESSION: No active cardiopulmonary disease. Electronically Signed   By: Fidela Salisbury MD   On: 12/25/2020 01:14        Scheduled Meds: . bisoprolol-hydrochlorothiazide  1 tablet Oral Daily  . cholecalciferol  4,000 Units Oral Daily  . clopidogrel  75 mg Oral Daily  . guaiFENesin  600 mg Oral BID  . hydrALAZINE  50 mg Oral TID  . Ipratropium-Albuterol  1 puff Inhalation Q6H  . multivitamin with minerals  1 tablet Oral Daily  . rOPINIRole  0.5 mg Oral QHS   Continuous Infusions: . azithromycin Stopped (12/25/20 1400)  . cefTRIAXone (ROCEPHIN)  IV 1 g (12/26/20 1137)  . heparin 1,250 Units/hr (12/25/20 2123)     LOS: 1 day    Time spent: 28 minutes    Sharen Hones, MD [facility]   To contact the attending provider between  7A-7P or the covering provider during after hours 7P-7A, please log into the web site [email or web address] and access using universal [place] password for that web site. If you do not have the password, please call the hospital operator.  12/26/2020, 1:11 PM

## 2020-12-26 NOTE — Progress Notes (Signed)
[place] for Heparin Infusion Indication: ACS/STEMI  Allergies  Allergen Reactions  . Chlorhexidine Hives    SAGE wipes caused severe rash  . Cyclobenzaprine Hcl     Doesn't remember reaction  . Etodolac     Doesn't remember reaction  . Lisinopril     Coughing up blood  . Meloxicam     Doesn't remember reaction  . Metaxalone     Doesn't remember reaction  . Neomycin-Bacitracin Zn-Polymyx     Skin irritation  . Nifedipine Swelling  . Nitrofurantoin     Doesn't remember reaction  . Sulfamethoxazole-Trimethoprim Hives  . Sulfonamide Derivatives Hives  . Tizanidine     Liver problems  . Vasotec [Enalapril Maleate]     Doesn't remember reaction  . Ibuprofen Rash  . Tape Rash    Patient Measurements: Height: 5\' 5"  (165.1 cm) Weight: 99.8 kg (220 lb 0.3 oz) IBW/kg (Calculated) : 57 Heparin Dosing Weight: 77.1 kg  Vital Signs: Temp: 98.2 F (36.8 C) (01/29 1135) Temp Source: Oral (01/29 1135) BP: 112/59 (01/29 1135) Pulse Rate: 100 (01/29 1135)  Labs: Recent Labs    12/25/20 0020 12/25/20 0147 12/25/20 0646 12/25/20 0846 12/25/20 1032 12/25/20 2320 12/26/20 0446 12/26/20 0740  HGB 13.7  --   --   --   --   --  12.2  --   HCT 41.2  --   --   --   --   --  37.5  --   PLT 151  --   --   --   --   --  135*  --   APTT  --  25  --   --   --   --   --   --   LABPROT  --  14.1  --   --   --   --  14.8  --   INR  --  1.1  --   --   --   --  1.2  --   HEPARINUNFRC  --   --   --   --  0.17* 0.60  --  0.58  CREATININE 1.64*  --   --  1.40*  --   --  1.27*  --   CKTOTAL  --   --  728*  --   --   --   --   --   TROPONINIHS 634* 583*  --   --   --   --   --   --     Estimated Creatinine Clearance: 40 mL/min (A) (by C-G formula based on SCr of 1.27 mg/dL (H)).   Medical History: Past Medical History:  Diagnosis Date  . Arthritis    Knees, hands  . Basal cell carcinoma    back - removed  . Bladder polyp    With previos  hematuria  . Diverticulitis   . Dyspnea on exertion    Chronic  . GERD (gastroesophageal reflux disease)   . History of ETT    Myoview 7/10 EF 78%, no ischemia or infarction. Poor exercise tolerance (2'30", stopped due to fatigue). Reached 86% MPHR.  Marland Kitchen Hypertension   . Kidney stones   . Multiple allergies    Chronic  . Palpitations    Holter 7/10 with rare PACs  . Postmenopausal   . S/P hysterectomy   . S/P laminectomy   . 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 Troponin elevated suspected s/t demand ischemia. Pharmacy consult for heparin drip.  Goal of Therapy:  Heparin level 0.3-0.7 units/ml Monitor platelets by anticoagulation protocol: Yes   Plan:  1/28 1032 HL 0.17 subtherapeutic. 1/28 2320 HL 0.60 therapeutic 1/29 0740 HL 0.58 therapeutic  Continue heparin drip rate at 1250 units/hr. CBC and HL daily while on heparin drip.  Dorena Bodo, PharmD 12/26/2020 2:58 PM

## 2020-12-26 NOTE — Progress Notes (Signed)
[facility] Cardiology    SUBJECTIVE: Patient states she feels a little bit anxious she has had some palpitations denies any significant fever she complains of generalized weakness.  Still on supplemental oxygen as necessary   Vitals:   12/26/20 0500 12/26/20 0544 12/26/20 0817 12/26/20 1135  BP:  (!) 111/49 (!) 113/46 (!) 112/59  Pulse:  (!) 35 85 100  Resp:  (!) 21 20 (!) 21  Temp: 98.3 F (36.8 C) 98.3 F (36.8 C) 97.9 F (36.6 C) 98.2 F (36.8 C)  TempSrc:  Oral Oral Oral  SpO2:  97% 98% 97%  Weight:      Height:         Intake/Output Summary (Last 24 hours) at 12/26/2020 1353 Last data filed at 12/26/2020 1138 Gross per 24 hour  Intake 306.27 ml  Output -  Net 306.27 ml      PHYSICAL EXAM  General: Well developed, well nourished, in no acute distress HEENT:  Normocephalic and atramatic Neck:  No JVD.  Lungs: Clear bilaterally to auscultation and percussion. Heart: Tachycardia. Normal S1 and S2 without gallops or murmurs.  Abdomen: Bowel sounds are positive, abdomen soft and non-tender  Msk:  Back normal, normal gait. Normal strength and tone for age. Extremities: No clubbing, cyanosis or edema.   Neuro: Alert and oriented X 3. Psych:  Good affect, responds appropriately   LABS: Basic Metabolic Panel: Recent Labs    12/25/20 0846 12/25/20 1805 12/26/20 0446  NA 135  --  137  K 2.8* 3.6 3.8  CL 107  --  110  CO2 17*  --  17*  GLUCOSE 122*  --  83  BUN 35*  --  34*  CREATININE 1.40*  --  1.27*  CALCIUM 7.0*  --  7.5*  MG 1.9  --  2.2  PHOS 2.4*  --   --    Liver Function Tests: No results for input(s): AST, ALT, ALKPHOS, BILITOT, PROT, ALBUMIN in the last 72 hours. No results for input(s): LIPASE, AMYLASE in the last 72 hours. CBC: Recent Labs    12/25/20 0020 12/26/20 0446  WBC 10.6* 8.0  HGB 13.7 12.2  HCT 41.2 37.5  MCV 90.7 92.4  PLT 151 135*   Cardiac Enzymes: Recent Labs    12/25/20 0646  CKTOTAL 728*   BNP: Invalid input(s):  POCBNP D-Dimer: No results for input(s): DDIMER in the last 72 hours. Hemoglobin A1C: No results for input(s): HGBA1C in the last 72 hours. Fasting Lipid Panel: No results for input(s): CHOL, HDL, LDLCALC, TRIG, CHOLHDL, LDLDIRECT in the last 72 hours. Thyroid Function Tests: No results for input(s): TSH, T4TOTAL, T3FREE, THYROIDAB in the last 72 hours.  Invalid input(s): FREET3 Anemia Panel: No results for input(s): VITAMINB12, FOLATE, FERRITIN, TIBC, IRON, RETICCTPCT in the last 72 hours.  DG Chest 2 View  Result Date: 12/25/2020 CLINICAL DATA:  Weakness EXAM: CHEST - 2 VIEW COMPARISON:  09/08/2013 FINDINGS: Lungs are well expanded, symmetric, and clear. No pneumothorax or pleural effusion. Cardiac size within normal limits. Pulmonary vascularity is normal. Osseous structures are age-appropriate. No acute bone abnormality. IMPRESSION: No active cardiopulmonary disease. Electronically Signed   By: Fidela Salisbury MD   On: 12/25/2020 01:14     Echo pending  TELEMETRY: Sinus tachycardia bundle branch block rate of 120  ASSESSMENT AND PLAN:  Active Problems:   COVID-19   Sepsis ([place])   Community acquired pneumonia Tachycardia Shortness of breath Hyperlipidemia Hypertension Palpitation Chronic renal sufficiency stage III Elevated troponin  Hypokalemia  Plan Agree with telemetry continue to follow-up EKGs Recommend rate control will add calcium blocker for rate control Switch anticoagulation to Lovenox on heparin Do not recommend any invasive strategies for elevated troponins Continue supportive care for Covid Lipid management with Lipitor Normal oximetry shortness of breath Broad-spectrum antibiotic therapy for possible community-acquired pneumonia Consider nephrology input for renal insufficiency Elevated troponins may very well be non-STEMI but could possibly demand ischemia related to Covid   Yolonda Kida, MD 12/26/2020 1:53 PM

## 2020-12-26 NOTE — Progress Notes (Signed)
Patient was 100% on 2 Liters. She is on room air at this time, she is 96%

## 2020-12-26 NOTE — Progress Notes (Signed)
Pt was admitted on the floor with no signs of distress. Pt alert x 4. VSS. Pt was educated about safety and ascom within pt reach. Will continue to monitor. 

## 2020-12-26 NOTE — Progress Notes (Signed)
[place] for Heparin Infusion Indication: ACS/STEMI  Allergies  Allergen Reactions  . Chlorhexidine Hives    SAGE wipes caused severe rash  . Cyclobenzaprine Hcl     Doesn't remember reaction  . Etodolac     Doesn't remember reaction  . Lisinopril     Coughing up blood  . Meloxicam     Doesn't remember reaction  . Metaxalone     Doesn't remember reaction  . Neomycin-Bacitracin Zn-Polymyx     Skin irritation  . Nifedipine Swelling  . Nitrofurantoin     Doesn't remember reaction  . Sulfamethoxazole-Trimethoprim Hives  . Sulfonamide Derivatives Hives  . Tizanidine     Liver problems  . Vasotec [Enalapril Maleate]     Doesn't remember reaction  . Ibuprofen Rash  . Tape Rash    Patient Measurements: Height: 5\' 5"  (165.1 cm) Weight: 99.8 kg (220 lb) IBW/kg (Calculated) : 57 Heparin Dosing Weight: 77.1 kg  Vital Signs: Temp: 98.4 F (36.9 C) (01/28 2344) Temp Source: Oral (01/28 2344) BP: 99/59 (01/28 2204) Pulse Rate: 66 (01/28 2204)  Labs: Recent Labs    12/25/20 0020 12/25/20 0147 12/25/20 0646 12/25/20 0846 12/25/20 1032 12/25/20 2320  HGB 13.7  --   --   --   --   --   HCT 41.2  --   --   --   --   --   PLT 151  --   --   --   --   --   APTT  --  25  --   --   --   --   LABPROT  --  14.1  --   --   --   --   INR  --  1.1  --   --   --   --   HEPARINUNFRC  --   --   --   --  0.17* 0.60  CREATININE 1.64*  --   --  1.40*  --   --   CKTOTAL  --   --  728*  --   --   --   TROPONINIHS 634* 583*  --   --   --   --     Estimated Creatinine Clearance: 36.2 mL/min (A) (by C-G formula based on SCr of 1.4 mg/dL (H)).   Medical History: Past Medical History:  Diagnosis Date  . Arthritis    Knees, hands  . Basal cell carcinoma    back - removed  . Bladder polyp    With previos hematuria  . Diverticulitis   . Dyspnea on exertion    Chronic  . GERD (gastroesophageal reflux disease)   . History of ETT    Myoview  7/10 EF 78%, no ischemia or infarction. Poor exercise tolerance (2'30", stopped due to fatigue). Reached 86% MPHR.  Marland Kitchen Hypertension   . Kidney stones   . Multiple allergies    Chronic  . Palpitations    Holter 7/10 with rare PACs  . Postmenopausal   . S/P hysterectomy   . S/P laminectomy   . 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 Troponin elevated suspected s/t demand ischemia. Pharmacy consult for heparin drip.  Goal of Therapy:  Heparin level 0.3-0.7 units/ml Monitor platelets by anticoagulation protocol: Yes   Plan:  1/28 1032 HL 0.17 subtherapeutic. 1/28 2320 HL 0.60 therapeutic  Continue heparin drip  rate at 1250 units/hr. Recheck HL at 0700. CBC daily while on heparin drip.  Renda Rolls, PharmD, [facility] 12/26/2020 12:13 AM

## 2020-12-27 LAB — PHOSPHORUS: Phosphorus: 2.2 mg/dL — ABNORMAL LOW (ref 2.5–4.6)

## 2020-12-27 LAB — BASIC METABOLIC PANEL
Anion gap: 9 (ref 5–15)
BUN: 31 mg/dL — ABNORMAL HIGH (ref 8–23)
CO2: 19 mmol/L — ABNORMAL LOW (ref 22–32)
Calcium: 7.4 mg/dL — ABNORMAL LOW (ref 8.9–10.3)
Chloride: 106 mmol/L (ref 98–111)
Creatinine, Ser: 1.3 mg/dL — ABNORMAL HIGH (ref 0.44–1.00)
GFR, Estimated: 41 mL/min — ABNORMAL LOW (ref >60)
Glucose, Bld: 93 mg/dL (ref 70–99)
Potassium: 3.3 mmol/L — ABNORMAL LOW (ref 3.5–5.1)
Sodium: 134 mmol/L — ABNORMAL LOW (ref 135–145)

## 2020-12-27 LAB — CBC
HCT: 33.3 % — ABNORMAL LOW (ref 36.0–46.0)
Hemoglobin: 11 g/dL — ABNORMAL LOW (ref 12.0–15.0)
MCH: 29.8 pg (ref 26.0–34.0)
MCHC: 33 g/dL (ref 30.0–36.0)
MCV: 90.2 fL (ref 80.0–100.0)
Platelets: 125 10*3/uL — ABNORMAL LOW (ref 150–400)
RBC: 3.69 MIL/uL — ABNORMAL LOW (ref 3.87–5.11)
RDW: 14.1 % (ref 11.5–15.5)
WBC: 7 10*3/uL (ref 4.0–10.5)
nRBC: 0 % (ref 0.0–0.2)

## 2020-12-27 LAB — HEPARIN LEVEL (UNFRACTIONATED): Heparin Unfractionated: 0.31 IU/mL (ref 0.30–0.70)

## 2020-12-27 LAB — MAGNESIUM: Magnesium: 1.9 mg/dL (ref 1.7–2.4)

## 2020-12-27 MED ORDER — HEPARIN (PORCINE) 25000 UT/250ML-% IV SOLN
1350.0000 [IU]/h | INTRAVENOUS | Status: DC
  Administered 2020-12-27: 1350 [IU]/h via INTRAVENOUS

## 2020-12-27 MED ORDER — POTASSIUM & SODIUM PHOSPHATES 280-160-250 MG PO PACK
1.0000 | PACK | Freq: Three times a day (TID) | ORAL | Status: AC
  Administered 2020-12-27 – 2020-12-28 (×3): 1 via ORAL
  Filled 2020-12-27 (×4): qty 1

## 2020-12-27 MED ORDER — FUROSEMIDE 10 MG/ML IJ SOLN
40.0000 mg | Freq: Once | INTRAMUSCULAR | Status: AC
  Administered 2020-12-27: 40 mg via INTRAVENOUS
  Filled 2020-12-27: qty 4

## 2020-12-27 MED ORDER — MORPHINE SULFATE (PF) 2 MG/ML IV SOLN
1.0000 mg | INTRAVENOUS | Status: DC | PRN
  Administered 2020-12-27: 2 mg via INTRAVENOUS
  Filled 2020-12-27: qty 1

## 2020-12-27 MED ORDER — MAGIC MOUTHWASH W/LIDOCAINE
10.0000 mL | Freq: Four times a day (QID) | ORAL | Status: DC
  Administered 2020-12-28 – 2020-12-30 (×7): 10 mL via ORAL
  Filled 2020-12-27 (×14): qty 10

## 2020-12-27 MED ORDER — HEPARIN (PORCINE) 25000 UT/250ML-% IV SOLN
1250.0000 [IU]/h | INTRAVENOUS | Status: DC
  Administered 2020-12-27: 1250 [IU]/h via INTRAVENOUS

## 2020-12-27 MED ORDER — PREDNISONE 50 MG PO TABS
50.0000 mg | ORAL_TABLET | Freq: Every day | ORAL | Status: DC
  Administered 2020-12-28 – 2020-12-29 (×2): 50 mg via ORAL
  Filled 2020-12-27 (×2): qty 1

## 2020-12-27 MED ORDER — ENOXAPARIN SODIUM 60 MG/0.6ML ~~LOC~~ SOLN
0.5000 mg/kg | SUBCUTANEOUS | Status: DC
  Administered 2020-12-27 – 2020-12-29 (×3): 50 mg via SUBCUTANEOUS
  Filled 2020-12-27 (×4): qty 0.6

## 2020-12-27 MED ORDER — OXYCODONE-ACETAMINOPHEN 5-325 MG PO TABS
1.0000 | ORAL_TABLET | Freq: Four times a day (QID) | ORAL | Status: DC | PRN
  Administered 2020-12-27 – 2020-12-29 (×7): 1 via ORAL
  Filled 2020-12-27 (×9): qty 1

## 2020-12-27 MED ORDER — POTASSIUM CHLORIDE 10 MEQ/100ML IV SOLN
10.0000 meq | INTRAVENOUS | Status: AC
  Administered 2020-12-27 (×2): 10 meq via INTRAVENOUS
  Filled 2020-12-27 (×2): qty 100

## 2020-12-27 MED ORDER — SODIUM CHLORIDE 0.9 % IV SOLN
2.0000 g | INTRAVENOUS | Status: DC
  Administered 2020-12-27 – 2020-12-29 (×3): 2 g via INTRAVENOUS
  Filled 2020-12-27 (×3): qty 20

## 2020-12-27 NOTE — Progress Notes (Signed)
[facility]    SUBJECTIVE: Patient states to be doing reasonably well still has some mild dyspnea denies any pain undergoing physical therapy now to help with strength training and ambulation   Vitals:   12/26/20 1535 12/26/20 1949 12/27/20 0435 12/27/20 0722  BP: (!) 118/58 129/73 (!) 116/55 106/90  Pulse: 98 93 64 83  Resp: (!) 21 18 18 17   Temp: 98 F (36.7 C) 98.8 F (37.1 C) (!) 97.5 F (36.4 C) 97.8 F (36.6 C)  TempSrc: Oral Oral Oral Oral  SpO2: 96% 100% 98% 96%  Weight:   99.7 kg   Height:         Intake/Output Summary (Last 24 hours) at 12/27/2020 1205 Last data filed at 12/27/2020 0940 Gross per 24 hour  Intake 1862.99 ml  Output 2500 ml  Net -637.01 ml      PHYSICAL EXAM  General: Well developed, well nourished, in no acute distress HEENT:  Normocephalic and atramatic Neck:  No JVD.  Lungs: Clear bilaterally to auscultation and percussion. Heart: HRRR . Normal S1 and S2 without gallops or murmurs.  Abdomen: Bowel sounds are positive, abdomen soft and non-tender  Msk:  Back normal, normal gait. Normal strength and tone for age. Extremities: No clubbing, cyanosis or edema.   Neuro: Alert and oriented X 3. Psych:  Good affect, responds appropriately   LABS: Basic Metabolic Panel: Recent Labs    12/25/20 0846 12/25/20 1805 12/26/20 0446 12/27/20 0449  NA 135  --  137 134*  K 2.8*   < > 3.8 3.3*  CL 107  --  110 106  CO2 17*  --  17* 19*  GLUCOSE 122*  --  83 93  BUN 35*  --  34* 31*  CREATININE 1.40*  --  1.27* 1.30*  CALCIUM 7.0*  --  7.5* 7.4*  MG 1.9  --  2.2 1.9  PHOS 2.4*  --   --  2.2*   < > = values in this interval not displayed.   Liver Function Tests: No results for input(s): AST, ALT, ALKPHOS, BILITOT, PROT, ALBUMIN in the last 72 hours. No results for input(s): LIPASE, AMYLASE in the last 72 hours. CBC: Recent Labs    12/26/20 0446 12/27/20 0449  WBC 8.0 7.0  HGB 12.2 11.0*  HCT 37.5 33.3*  MCV 92.4 90.2  PLT 135*  125*   Cardiac Enzymes: Recent Labs    12/25/20 0646  CKTOTAL 728*   BNP: Invalid input(s): POCBNP D-Dimer: No results for input(s): DDIMER in the last 72 hours. Hemoglobin A1C: No results for input(s): HGBA1C in the last 72 hours. Fasting Lipid Panel: No results for input(s): CHOL, HDL, LDLCALC, TRIG, CHOLHDL, LDLDIRECT in the last 72 hours. Thyroid Function Tests: No results for input(s): TSH, T4TOTAL, T3FREE, THYROIDAB in the last 72 hours.  Invalid input(s): FREET3 Anemia Panel: No results for input(s): VITAMINB12, FOLATE, FERRITIN, TIBC, IRON, RETICCTPCT in the last 72 hours.  ECHOCARDIOGRAM COMPLETE  Result Date: 12/26/2020    ECHOCARDIOGRAM REPORT   Patient Name:   Julie Mcbride Date of Exam: 12/25/2020 Medical Rec #:  [id number]        Height:       65.0 in Accession #:    [id number]       Weight:       200.0 lb Date of Birth:  1936-03-11       BSA:          1.978 m Patient Age:  84 years         BP:           94/50 mmHg Patient Gender: F                HR:           71 bpm. Exam Location:  [facility] Procedure: 2D Echo, Color Doppler, Cardiac Doppler and Intracardiac            Opacification Agent Indications:     Elevated troponin  History:         Patient has no prior history of Echocardiogram examinations.                  Risk Factors:Hypertension.  Sonographer:     Charmayne Sheer RDCS (AE) Referring Phys:  [id number] Arvella Merles MANSY Diagnosing Phys: Yolonda Kida MD  Sonographer Comments: Suboptimal parasternal window. IMPRESSIONS  1. Left ventricular ejection fraction, by estimation, is 45 to 50%. The left ventricle has mildly decreased function. The left ventricle demonstrates global hypokinesis. Left ventricular diastolic parameters were normal.  2. Right ventricular systolic function is low normal. The right ventricular size is normal.  3. The mitral valve is normal in structure. No evidence of mitral valve regurgitation.  4. The aortic valve is grossly normal. Aortic valve  regurgitation is not visualized. FINDINGS  Left Ventricle: Left ventricular ejection fraction, by estimation, is 45 to 50%. The left ventricle has mildly decreased function. The left ventricle demonstrates global hypokinesis. Definity contrast agent was given IV to delineate the left ventricular  endocardial borders. The left ventricular internal cavity size was normal in size. There is no left ventricular hypertrophy. Left ventricular diastolic parameters were normal. Right Ventricle: The right ventricular size is normal. No increase in right ventricular wall thickness. Right ventricular systolic function is low normal. Left Atrium: Left atrial size was normal in size. Right Atrium: Right atrial size was normal in size. Pericardium: There is no evidence of pericardial effusion. Mitral Valve: The mitral valve is normal in structure. No evidence of mitral valve regurgitation. MV peak gradient, 6.2 mmHg. The mean mitral valve gradient is 3.0 mmHg. Tricuspid Valve: The tricuspid valve is normal in structure. Tricuspid valve regurgitation is not demonstrated. Aortic Valve: The aortic valve is grossly normal. Aortic valve regurgitation is not visualized. Aortic valve mean gradient measures 5.0 mmHg. Aortic valve peak gradient measures 9.0 mmHg. Aortic valve area, by VTI measures 2.67 cm. Pulmonic Valve: The pulmonic valve was normal in structure. Pulmonic valve regurgitation is not visualized. Aorta: The ascending aorta was not well visualized. IAS/Shunts: No atrial level shunt detected by color flow Doppler.  LEFT VENTRICLE PLAX 2D LVIDd:         4.50 cm      Diastology LVIDs:         3.00 cm      LV e' medial:    5.55 cm/s LV PW:         1.10 cm      LV E/e' medial:  20.0 LV IVS:        0.90 cm      LV e' lateral:   3.70 cm/s LVOT diam:     2.00 cm      LV E/e' lateral: 30.0 LV SV:         78 LV SV Index:   40 LVOT Area:     3.14 cm  LV Volumes (MOD) LV vol d, MOD A2C: 114.0 ml LV vol d,  MOD A4C: 87.9 ml LV vol s,  MOD A2C: 55.6 ml LV vol s, MOD A4C: 49.3 ml LV SV MOD A2C:     58.4 ml LV SV MOD A4C:     87.9 ml LV SV MOD BP:      47.9 ml RIGHT VENTRICLE RV Basal diam:  3.70 cm LEFT ATRIUM             Index       RIGHT ATRIUM           Index LA diam:        5.00 cm 2.53 cm/m  RA Area:     17.60 cm LA Vol (A2C):   46.6 ml 23.56 ml/m RA Volume:   46.90 ml  23.71 ml/m LA Vol (A4C):   49.3 ml 24.92 ml/m LA Biplane Vol: 47.7 ml 24.11 ml/m  AORTIC VALVE AV Area (Vmax):    2.45 cm AV Area (Vmean):   2.34 cm AV Area (VTI):     2.67 cm AV Vmax:           150.00 cm/s AV Vmean:          106.500 cm/s AV VTI:            0.293 m AV Peak Grad:      9.0 mmHg AV Mean Grad:      5.0 mmHg LVOT Vmax:         117.00 cm/s LVOT Vmean:        79.400 cm/s LVOT VTI:          0.249 m LVOT/AV VTI ratio: 0.85  AORTA Ao Root diam: 3.10 cm MITRAL VALVE MV Area (PHT): 3.33 cm     SHUNTS MV Area VTI:   1.94 cm     Systemic VTI:  0.25 m MV Peak grad:  6.2 mmHg     Systemic Diam: 2.00 cm MV Mean grad:  3.0 mmHg MV Vmax:       1.25 m/s MV Vmean:      84.5 cm/s MV Decel Time: 228 msec MV E velocity: 111.00 cm/s MV A velocity: 115.00 cm/s MV E/A ratio:  0.97 Mikeila Burgen D Traevon Meiring MD Electronically signed by Yolonda Kida MD Signature Date/Time: 12/26/2020/2:41:29 PM    Final      Echo ejection fraction between 45 to 50%  TELEMETRY: Normal sinus rhythm bundle branch block rate of 75  ASSESSMENT AND PLAN:  Active Problems:   COVID-19   Sepsis ([place])   Community acquired pneumonia Elevated troponins Shortness of breath Palpitation Abnormal EKG Chronic renal insufficiency stage III Hypertension . Plan Continue supportive management and care Diuretic therapy for mild crackles Discontinue heparin for demand ischemia and sepsis and treat with DVT prophylaxis Lovenox Agree with continued therapy for community-acquired pneumonia Increase activity physical therapy for balance and gait training and strength training Consider nephrology  input for renal insufficiency Continue blood pressure management and control       Yolonda Kida, MD 12/27/2020 12:05 PM

## 2020-12-27 NOTE — Progress Notes (Signed)
[facility] for Heparin Infusion Indication: ACS/STEMI  Allergies  Allergen Reactions  . Chlorhexidine Hives    SAGE wipes caused severe rash  . Cyclobenzaprine Hcl     Doesn't remember reaction  . Etodolac     Doesn't remember reaction  . Lisinopril     Coughing up blood  . Meloxicam     Doesn't remember reaction  . Metaxalone     Doesn't remember reaction  . Neomycin-Bacitracin Zn-Polymyx     Skin irritation  . Nifedipine Swelling  . Nitrofurantoin     Doesn't remember reaction  . Sulfamethoxazole-Trimethoprim Hives  . Sulfonamide Derivatives Hives  . Tizanidine     Liver problems  . Vasotec [Enalapril Maleate]     Doesn't remember reaction  . Ibuprofen Rash  . Tape Rash    Patient Measurements: Height: 5\' 5"  (165.1 cm) Weight: 99.7 kg (219 lb 14.5 oz) IBW/kg (Calculated) : 57 Heparin Dosing Weight: 77.1 kg  Vital Signs: Temp: 97.5 F (36.4 C) (01/30 0435) Temp Source: Oral (01/30 0435) BP: 116/55 (01/30 0435) Pulse Rate: 64 (01/30 0435)  Labs: Recent Labs    12/25/20 0020 12/25/20 0147 12/25/20 8841 12/25/20 0846 12/25/20 1032 12/25/20 2320 12/26/20 0446 12/26/20 0740 12/27/20 0449  HGB 13.7  --   --   --   --   --  12.2  --  11.0*  HCT 41.2  --   --   --   --   --  37.5  --  33.3*  PLT 151  --   --   --   --   --  135*  --  125*  APTT  --  25  --   --   --   --   --   --   --   LABPROT  --  14.1  --   --   --   --  14.8  --   --   INR  --  1.1  --   --   --   --  1.2  --   --   HEPARINUNFRC  --   --   --   --    < > 0.60  --  0.58 0.31  CREATININE 1.64*  --   --  1.40*  --   --  1.27*  --  1.30*  CKTOTAL  --   --  728*  --   --   --   --   --   --   TROPONINIHS 634* 583*  --   --   --   --   --   --   --    < > = values in this interval not displayed.    Estimated Creatinine Clearance: 39 mL/min (A) (by C-G formula based on SCr of 1.3 mg/dL (H)).   Medical History: Past Medical History:  Diagnosis Date   . Arthritis    Knees, hands  . Basal cell carcinoma    back - removed  . Bladder polyp    With previos hematuria  . Diverticulitis   . Dyspnea on exertion    Chronic  . GERD (gastroesophageal reflux disease)   . History of ETT    Myoview 7/10 EF 78%, no ischemia or infarction. Poor exercise tolerance (2'30", stopped due to fatigue). Reached 86% MPHR.  Marland Kitchen Hypertension   . Kidney stones   . Multiple allergies    Chronic  . Palpitations    Holter  7/10 with rare PACs  . Postmenopausal   . S/P hysterectomy   . S/P laminectomy   . 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 Troponin elevated suspected s/t demand ischemia. Pharmacy consult for heparin drip.  Goal of Therapy:  Heparin level 0.3-0.7 units/ml Monitor platelets by anticoagulation protocol: Yes   Plan:  1/28 1032 HL 0.17 subtherapeutic. 1/28 2320 HL 0.60 therapeutic 1/29 0740 HL 0.58 therapeutic 1/30 0449 HL 0.31 therapeutic  HL = 0.31, therapeutic, trending downward and now at low end of therapeutic range.  Will  increase heparin drip rate to 1350 units/hr and recheck HL in 8 hours.  CBC and HL daily while on heparin drip.  Renda Rolls, PharmD, [facility] 12/27/2020 6:29 AM

## 2020-12-27 NOTE — Progress Notes (Signed)
PROGRESS NOTE    [name]  OZH:[id number] DOB: 03/03/1936 DOA: 12/25/2020 PCP: Dion Body, MD   Chief complaint.  Shortness of breath. Brief Narrative:  [name] a84 y.o.Caucasian femalewith a known history of multiple medical problems that are mentioned below, who presented to the emergency room with acute onset of generalized weakness for the last week and recent fever today with associated chills, mild cough without dyspnea or wheezing. She has been having urinary frequency but denies dysuria or oliguria.She had a home antigen test that was positive for Covid. But after arriving the hospital, both antigen and PCR test are negative for Covid. Patient has elevated procalcitonin level, treated with cefepime and vancomycin.  Then changed to Rocephin and Zithromax.   Assessment & Plan:   Active Problems:   COVID-19   Sepsis ([facility])   Community acquired pneumonia  #1.  Sepsis. Community-acquired pneumonia likely secondary to mycoplasma bacteria.  . Skin rash. Patient had a negative Covid, negative respiratory pathogen panel. Her condition with bacterial pneumonia and skin rash point to the reaction of mycoplasma pneumonia. So far blood cultures negative.  Urine culture has lower counts of bacteria, not consistent with UTI as patient has no urinary symptoms. Condition improving today, skin rash appears to be improving. I will also added prednisone for skin rash, patient also has worsening joint pain from arthritis, will benefit from steroids. Sepsis improved.  #2.Elevated troponin secondary demand ischemia secondary to sepsis. Likely acute on chronic diastolic congestive heart failure. Patient received a 40 mg IV Lasix for increased crackles in the base, short of breath much improved today.  Still has crackles in the base bilaterally, will give another dose of 40 mg Lasix.  Pending echocardiogram. Appreciate cardiology consult I will discontinue IV heparin,  change to prophylactic Lovenox.  #3.  Hypokalemia.  Hypophosphatemia. Supplement.  #4.  Mild thrombocytopenia. Still stable.       DVT prophylaxis: Lovenox Code Status: Full Family Communication: Husband updated at bedside Disposition Plan:  .   Status is: Inpatient  Remains inpatient appropriate because:Inpatient level of care appropriate due to severity of illness   Dispo: The patient is from: Home              Anticipated d/c is to: Home              Anticipated d/c date is: 2 days              Patient currently is not medically stable to d/c.   Difficult to place patient No        I/O last 3 completed shifts: In: 1809.3 [P.O.:920; I.V.:385.1; IV Piggyback:504.2] Out: 1900 [Urine:1900] No intake/output data recorded.     Consultants:   Cardiology  Procedures: None  Antimicrobials: Rocephin and Zithromax.  Subjective: Patient is more awake today, she is off oxygen.  Short of breath improved.  Now she is complaining significant pain in her legs and arms from arthritis and neuropathy. She still have a skin rash, no itching. No significant cough.  No abdominal pain or nausea vomiting. No fever or chills.  Objective: Vitals:   12/26/20 1535 12/26/20 1949 12/27/20 0435 12/27/20 0722  BP: (!) 118/58 129/73 (!) 116/55 106/90  Pulse: 98 93 64 83  Resp: (!) 21 18 18 17   Temp: 98 F (36.7 C) 98.8 F (37.1 C) (!) 97.5 F (36.4 C) 97.8 F (36.6 C)  TempSrc: Oral Oral Oral Oral  SpO2: 96% 100% 98% 96%  Weight:   99.7  kg   Height:        Intake/Output Summary (Last 24 hours) at 12/27/2020 1018 Last data filed at 12/27/2020 0634 Gross per 24 hour  Intake 1742.99 ml  Output 1900 ml  Net -157.01 ml   Filed Weights   12/25/20 2204 12/26/20 0400 12/27/20 0435  Weight: 99.8 kg 99.8 kg 99.7 kg    Examination:  General exam: Appears calm and comfortable  Respiratory system: Crackles in the base  bilaterally. Respiratory effort normal. Cardiovascular  system: S1 & S2 heard, RRR. No JVD, murmurs, rubs, gallops or clicks. No pedal edema. Gastrointestinal system: Abdomen is nondistended, soft and nontender. No organomegaly or masses felt. Normal bowel sounds heard. Central nervous system: Alert and oriented. No focal neurological deficits. Extremities: Symmetric 5 x 5 power. Skin: Rash became more macular. Psychiatry: Judgement and insight appear normal. Mood & affect appropriate.     Data Reviewed: I have personally reviewed following labs and imaging studies  CBC: Recent Labs  Lab 12/25/20 0020 12/26/20 0446 12/27/20 0449  WBC 10.6* 8.0 7.0  HGB 13.7 12.2 11.0*  HCT 41.2 37.5 33.3*  MCV 90.7 92.4 90.2  PLT 151 135* 0000000*   Basic Metabolic Panel: Recent Labs  Lab 12/25/20 0020 12/25/20 0846 12/25/20 1805 12/26/20 0446 12/27/20 0449  NA 134* 135  --  137 134*  K 2.9* 2.8* 3.6 3.8 3.3*  CL 98 107  --  110 106  CO2 22 17*  --  17* 19*  GLUCOSE 117* 122*  --  83 93  BUN 34* 35*  --  34* 31*  CREATININE 1.64* 1.40*  --  1.27* 1.30*  CALCIUM 8.2* 7.0*  --  7.5* 7.4*  MG  --  1.9  --  2.2 1.9  PHOS  --  2.4*  --   --  2.2*   GFR: Estimated Creatinine Clearance: 39 mL/min (A) (by C-G formula based on SCr of 1.3 mg/dL (H)). Liver Function Tests: No results for input(s): AST, ALT, ALKPHOS, BILITOT, PROT, ALBUMIN in the last 168 hours. No results for input(s): LIPASE, AMYLASE in the last 168 hours. No results for input(s): AMMONIA in the last 168 hours. Coagulation Profile: Recent Labs  Lab 12/25/20 0147 12/26/20 0446  INR 1.1 1.2   Cardiac Enzymes: Recent Labs  Lab 12/25/20 0646  CKTOTAL 728*   BNP (last 3 results) No results for input(s): PROBNP in the last 8760 hours. HbA1C: No results for input(s): HGBA1C in the last 72 hours. CBG: Recent Labs  Lab 12/25/20 2207  GLUCAP 92   Lipid Profile: No results for input(s): CHOL, HDL, LDLCALC, TRIG, CHOLHDL, LDLDIRECT in the last 72 hours. Thyroid Function  Tests: No results for input(s): TSH, T4TOTAL, FREET4, T3FREE, THYROIDAB in the last 72 hours. Anemia Panel: No results for input(s): VITAMINB12, FOLATE, FERRITIN, TIBC, IRON, RETICCTPCT in the last 72 hours. Sepsis Labs: Recent Labs  Lab 12/25/20 0027 12/25/20 0147 12/25/20 0319 12/26/20 0446  PROCALCITON  --   --  2.59 2.17  LATICACIDVEN 2.0* 1.7  --   --     Recent Results (from the past 240 hour(s))  Culture, blood (Routine X 2) w Reflex to ID Panel     Status: None (Preliminary result)   Collection Time: 12/25/20 12:27 AM   Specimen: Right Antecubital; Blood  Result Value Ref Range Status   Specimen Description RIGHT ANTECUBITAL  Final   Special Requests   Final    BOTTLES DRAWN AEROBIC AND ANAEROBIC Blood Culture adequate volume  Culture   Final    NO GROWTH 2 DAYS Performed at [facility], [address] [place], [place] [address]    Report Status PENDING  Incomplete  Culture, blood (Routine X 2) w Reflex to ID Panel     Status: None (Preliminary result)   Collection Time: 12/25/20  1:46 AM   Specimen: BLOOD  Result Value Ref Range Status   Specimen Description BLOOD LEFT FOREARM  Final   Special Requests   Final    BOTTLES DRAWN AEROBIC AND ANAEROBIC Blood Culture results may not be optimal due to an inadequate volume of blood received in culture bottles   Culture   Final    NO GROWTH 2 DAYS Performed at [facility], [address], [place], [place] [address]    Report Status PENDING  Incomplete  SARS Coronavirus 2 by RT PCR (hospital order, performed in [place] hospital lab) Nasopharyngeal Nasopharyngeal Swab     Status: None   Collection Time: 12/25/20  4:51 AM   Specimen: Nasopharyngeal Swab  Result Value Ref Range Status   SARS Coronavirus 2 NEGATIVE NEGATIVE Final    Comment: (NOTE) SARS-CoV-2 target nucleic acids are NOT DETECTED.  The SARS-CoV-2 RNA is generally detectable in upper and lower respiratory specimens during the  acute phase of infection. The lowest concentration of SARS-CoV-2 viral copies this assay can detect is 250 copies / mL. A negative result does not preclude SARS-CoV-2 infection and should not be used as the sole basis for treatment or other patient management decisions.  A negative result may occur with improper specimen collection / handling, submission of specimen other than nasopharyngeal swab, presence of viral mutation(s) within the areas targeted by this assay, and inadequate number of viral copies (<250 copies / mL). A negative result must be combined with clinical observations, patient history, and epidemiological information.  Fact Sheet for Patients:   StrictlyIdeas.no  Fact Sheet for Healthcare Providers: BankingDealers.co.za  This test is not yet approved or  cleared by the [place] FDA and has been authorized for detection and/or diagnosis of SARS-CoV-2 by FDA under an Emergency Use Authorization (EUA).  This EUA will remain in effect (meaning this test can be used) for the duration of the COVID-19 declaration under Section 564(b)(1) of the Act, 21 U.S.C. section 360bbb-3(b)(1), unless the authorization is terminated or revoked sooner.  Performed at [facility], [address], [place], [place] [address]   Urine Culture     Status: Abnormal   Collection Time: 12/25/20  6:15 AM   Specimen: Urine, Clean Catch  Result Value Ref Range Status   Specimen Description   Final    URINE, CLEAN CATCH Performed at [facility], [address], [place], [place] [address]    Special Requests   Final    NONE Performed at [facility], [address], [place], [place] [address]    Culture (A)  Final    <10,000 COLONIES/mL INSIGNIFICANT GROWTH Performed at [place] [address], [place], [place] [address]    Report Status 12/26/2020 FINAL  Final  Respiratory (~20 pathogens) panel by  PCR     Status: None   Collection Time: 12/25/20 12:21 PM   Specimen: Nasopharyngeal Swab; Respiratory  Result Value Ref Range Status   Adenovirus NOT DETECTED NOT DETECTED Final   Coronavirus 229E NOT DETECTED NOT DETECTED Final    Comment: (NOTE) The Coronavirus on the Respiratory Panel, DOES NOT test for the  novel  Coronavirus (2019 nCoV)    Coronavirus HKU1 NOT DETECTED NOT DETECTED Final   Coronavirus NL63 NOT DETECTED NOT DETECTED Final   Coronavirus OC43 NOT DETECTED NOT DETECTED Final   Metapneumovirus NOT DETECTED NOT DETECTED Final   Rhinovirus / Enterovirus NOT DETECTED NOT DETECTED Final   Influenza A NOT DETECTED NOT DETECTED Final   Influenza B NOT DETECTED NOT DETECTED Final   Parainfluenza Virus 1 NOT DETECTED NOT DETECTED Final   Parainfluenza Virus 2 NOT DETECTED NOT DETECTED Final   Parainfluenza Virus 3 NOT DETECTED NOT DETECTED Final   Parainfluenza Virus 4 NOT DETECTED NOT DETECTED Final   Respiratory Syncytial Virus NOT DETECTED NOT DETECTED Final   Bordetella pertussis NOT DETECTED NOT DETECTED Final   Bordetella Parapertussis NOT DETECTED NOT DETECTED Final   Chlamydophila pneumoniae NOT DETECTED NOT DETECTED Final   Mycoplasma pneumoniae NOT DETECTED NOT DETECTED Final    Comment: Performed at [facility], [place] [address] [place], [place] [address]         Radiology Studies: ECHOCARDIOGRAM COMPLETE  Result Date: 12/26/2020    ECHOCARDIOGRAM REPORT   Patient Name:   URI COVEY Date of Exam: 12/25/2020 Medical Rec #:  [id number]        Height:       65.0 in Accession #:    [id number]       Weight:       200.0 lb Date of Birth:  May 29, 1936       BSA:          1.978 m Patient Age:    35 years         BP:           94/50 mmHg Patient Gender: F                HR:           71 bpm. Exam Location:  [facility] Procedure: 2D Echo, Color Doppler, Cardiac Doppler and Intracardiac            Opacification Agent Indications:     Elevated troponin  History:          Patient has no prior history of Echocardiogram examinations.                  Risk Factors:Hypertension.  Sonographer:     Charmayne Sheer RDCS (AE) Referring Phys:  [id number] Arvella Merles MANSY Diagnosing Phys: Yolonda Kida MD  Sonographer Comments: Suboptimal parasternal window. IMPRESSIONS  1. Left ventricular ejection fraction, by estimation, is 45 to 50%. The left ventricle has mildly decreased function. The left ventricle demonstrates global hypokinesis. Left ventricular diastolic parameters were normal.  2. Right ventricular systolic function is low normal. The right ventricular size is normal.  3. The mitral valve is normal in structure. No evidence of mitral valve regurgitation.  4. The aortic valve is grossly normal. Aortic valve regurgitation is not visualized. FINDINGS  Left Ventricle: Left ventricular ejection fraction, by estimation, is 45 to 50%. The left ventricle has mildly decreased function. The left ventricle demonstrates global hypokinesis. Definity contrast agent was given IV to delineate the left ventricular  endocardial borders. The left ventricular internal cavity size was normal in size. There is no left ventricular hypertrophy. Left ventricular diastolic parameters were normal. Right Ventricle: The right ventricular size is normal. No increase in right ventricular wall thickness. Right ventricular systolic function is low normal. Left Atrium: Left atrial size was normal in size.  Right Atrium: Right atrial size was normal in size. Pericardium: There is no evidence of pericardial effusion. Mitral Valve: The mitral valve is normal in structure. No evidence of mitral valve regurgitation. MV peak gradient, 6.2 mmHg. The mean mitral valve gradient is 3.0 mmHg. Tricuspid Valve: The tricuspid valve is normal in structure. Tricuspid valve regurgitation is not demonstrated. Aortic Valve: The aortic valve is grossly normal. Aortic valve regurgitation is not visualized. Aortic valve mean gradient measures 5.0  mmHg. Aortic valve peak gradient measures 9.0 mmHg. Aortic valve area, by VTI measures 2.67 cm. Pulmonic Valve: The pulmonic valve was normal in structure. Pulmonic valve regurgitation is not visualized. Aorta: The ascending aorta was not well visualized. IAS/Shunts: No atrial level shunt detected by color flow Doppler.  LEFT VENTRICLE PLAX 2D LVIDd:         4.50 cm      Diastology LVIDs:         3.00 cm      LV e' medial:    5.55 cm/s LV PW:         1.10 cm      LV E/e' medial:  20.0 LV IVS:        0.90 cm      LV e' lateral:   3.70 cm/s LVOT diam:     2.00 cm      LV E/e' lateral: 30.0 LV SV:         78 LV SV Index:   40 LVOT Area:     3.14 cm  LV Volumes (MOD) LV vol d, MOD A2C: 114.0 ml LV vol d, MOD A4C: 87.9 ml LV vol s, MOD A2C: 55.6 ml LV vol s, MOD A4C: 49.3 ml LV SV MOD A2C:     58.4 ml LV SV MOD A4C:     87.9 ml LV SV MOD BP:      47.9 ml RIGHT VENTRICLE RV Basal diam:  3.70 cm LEFT ATRIUM             Index       RIGHT ATRIUM           Index LA diam:        5.00 cm 2.53 cm/m  RA Area:     17.60 cm LA Vol (A2C):   46.6 ml 23.56 ml/m RA Volume:   46.90 ml  23.71 ml/m LA Vol (A4C):   49.3 ml 24.92 ml/m LA Biplane Vol: 47.7 ml 24.11 ml/m  AORTIC VALVE AV Area (Vmax):    2.45 cm AV Area (Vmean):   2.34 cm AV Area (VTI):     2.67 cm AV Vmax:           150.00 cm/s AV Vmean:          106.500 cm/s AV VTI:            0.293 m AV Peak Grad:      9.0 mmHg AV Mean Grad:      5.0 mmHg LVOT Vmax:         117.00 cm/s LVOT Vmean:        79.400 cm/s LVOT VTI:          0.249 m LVOT/AV VTI ratio: 0.85  AORTA Ao Root diam: 3.10 cm MITRAL VALVE MV Area (PHT): 3.33 cm     SHUNTS MV Area VTI:   1.94 cm     Systemic VTI:  0.25 m MV Peak grad:  6.2 mmHg     Systemic Diam:  2.00 cm MV Mean grad:  3.0 mmHg MV Vmax:       1.25 m/s MV Vmean:      84.5 cm/s MV Decel Time: 228 msec MV E velocity: 111.00 cm/s MV A velocity: 115.00 cm/s MV E/A ratio:  0.97 Dwayne D Callwood MD Electronically signed by Yolonda Kida MD  Signature Date/Time: 12/26/2020/2:41:29 PM    Final         Scheduled Meds: . bisoprolol-hydrochlorothiazide  1 tablet Oral Daily  . cholecalciferol  4,000 Units Oral Daily  . clopidogrel  75 mg Oral Daily  . feeding supplement  237 mL Oral BID BM  . furosemide  40 mg Intravenous Once  . guaiFENesin  600 mg Oral BID  . hydrALAZINE  50 mg Oral TID  . Ipratropium-Albuterol  1 puff Inhalation Q6H  . multivitamin with minerals  1 tablet Oral Daily  . [START ON 12/28/2020] predniSONE  50 mg Oral Q breakfast  . rOPINIRole  0.5 mg Oral QHS   Continuous Infusions: . azithromycin 500 mg (12/26/20 1539)  . cefTRIAXone (ROCEPHIN)  IV 1 g (12/26/20 1137)  . heparin 1,350 Units/hr (12/27/20 0634)     LOS: 2 days    Time spent: 28 minutes    Sharen Hones, MD [facility]   To contact the attending provider between 7A-7P or the covering provider during after hours 7P-7A, please log into the web site [email or web address] and access using universal [place] password for that web site. If you do not have the password, please call the hospital operator.  12/27/2020, 10:18 AM

## 2020-12-27 NOTE — Evaluation (Addendum)
Physical Therapy Evaluation Patient Details Name: Julie Mcbride MRN: [id number] DOB: Jul 28, 1936 Today's Date: 12/27/2020   History of Present Illness  Pt is an 85 y/o F admitted on 12/25/20 after presenting to ED with acute onset of generalized weakness for the last week & 1 day of fever with chills & mild cough. Pt reports positive antigen COVID test at home but both antigen & PCR tests were negative at hospital. Pt admitted for tx of sespsis of unknown source. 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 Pt currently requires min assist overall for bed mobility, transfers, and short distance gait with RW. Pt c/o SOB but SPO2 >90% during session on room air. Pt with c/o significant pain in knees & hands throughout. Pt would benefit from HHPT f/u upon d/c & will require assistance for OOB mobility. Will continue to follow pt acutely to progress gait, endurance, & balance to reduce fall risk and increase independence with mobility prior to d/c & return home.  HR 77-103 bpm during session  Addendum: Pt assisted off bed pan at beginning of session after continent void.     Follow Up Recommendations Home health PT;Assistance/supervision for mobility/OOB    Equipment Recommendations  3in1 (PT);Wheelchair (measurements PT);Wheelchair cushion (measurements PT) (recommending w/c for community mobility)    Recommendations for Other Services       Precautions / Restrictions Precautions Precautions: Fall Restrictions Weight Bearing Restrictions: No      Mobility  Bed Mobility Overal bed mobility: Needs Assistance Bed Mobility: Supine to Sit     Supine to sit: Min assist;HOB elevated     General bed mobility comments: extra time    Transfers Overall transfer level: Needs assistance Equipment  used: Rolling walker (2 wheeled) Transfers: Sit to/from [facility] Sit to Stand: Min assist Stand pivot transfers: Min assist       General transfer comment: cuing for hand placement when transferring sit<>stand with RW, poor eccentric control with stand>sit  Ambulation/Gait Ambulation/Gait assistance: Min assist Gait Distance (Feet): 18 Feet (6 ft (3 forwards + 3 backwards) x 3) Assistive device: Rolling walker (2 wheeled) Gait Pattern/deviations: Decreased step length - left;Decreased step length - right;Decreased stride length Gait velocity: decreased      Stairs            Wheelchair Mobility    Modified Rankin (Stroke Patients Only)       Balance Overall balance assessment: Needs assistance Sitting-balance support: Bilateral upper extremity supported;Feet supported Sitting balance-Leahy Scale: Fair Sitting balance - Comments: supervision sitting EOB   Standing balance support: Bilateral upper extremity supported;During functional activity Standing balance-Leahy Scale: Poor Standing balance comment: BUE reliance on RW                             Pertinent Vitals/Pain Pain Assessment: Faces Faces Pain Scale: Hurts little more Pain Location: pt with significant c/o aching joints, specifically B hands & knees (R>L) Pain Intervention(s): Monitored during session;Repositioned;Limited activity within patient's tolerance (nurse made aware)    Home Living Family/patient expects to be discharged to:: Private residence Living Arrangements: Spouse/significant other Available Help at Discharge: Family;Available 24 hours/day Type of Home: House Home Access: Stairs to enter Entrance Stairs-Rails:  (R or L rail depending upon which door  she enters) Secretary/administrator of Steps: 4 Home Layout: One level Home Equipment: Walker - 2 wheels      Prior Function Level of Independence: Independent         Comments: Pt was independent  without AD, driving until ~1 week ago when symptoms began.     Hand Dominance        Extremity/Trunk Assessment   Upper Extremity Assessment Upper Extremity Assessment: Generalized weakness (tremors in BUE noted (L>R) with pt reporting this is new since symptom onset)    Lower Extremity Assessment Lower Extremity Assessment: Generalized weakness (not formally tested, pt c/o R knee giving her trouble since symptom onset)       Communication   Communication:  (difficulty verbalizing at loud volume 2/2 "hoarse")  Cognition Arousal/Alertness: Awake/alert Behavior During Therapy: WFL for tasks assessed/performed (somewhat anxious during session) Overall Cognitive Status: Within Functional Limits for tasks assessed                                 General Comments: Pt AxO x 4 but reports she just doesn't feel like herself      General Comments General comments (skin integrity, edema, etc.): Pt on room air during session, SpO2 >90% throughout except one reading of 77% but poor waveform noted (nurse made aware) & pt without c/o sx at that time but pt did note c/o SOB after transferring to recliner when SpO2 >90%. PT educates pt on pursed lip breathing throughout session. Pt moves at a very slow pace with all mobility tasks, occasionally internally distracted with conversation with PT providing gentle redirection to task at hand.    Exercises     Assessment/Plan    PT Assessment Patient needs continued PT services  PT Problem List Decreased strength;Decreased balance;Decreased knowledge of precautions;Pain;Decreased mobility;Decreased knowledge of use of DME;Cardiopulmonary status limiting activity;Decreased activity tolerance       PT Treatment Interventions      PT Goals (Current goals can be found in the Care Plan section)  Acute Rehab PT Goals Patient Stated Goal: decreased pain, get better PT Goal Formulation: With patient Time For Goal Achievement:  01/10/21 Potential to Achieve Goals: Fair    Frequency Min 2X/week   Barriers to discharge  (pt reports her husband can physically assist her at d/c)      Co-evaluation               AM-PAC PT "6 Clicks" Mobility  Outcome Measure Help needed turning from your back to your side while in a flat bed without using bedrails?: A Little Help needed moving from lying on your back to sitting on the side of a flat bed without using bedrails?: A Lot Help needed moving to and from a bed to a chair (including a wheelchair)?: A Little Help needed standing up from a chair using your arms (e.g., wheelchair or bedside chair)?: A Little Help needed to walk in hospital room?: A Little Help needed climbing 3-5 steps with a railing? : A Lot 6 Click Score: 16    End of Session   Activity Tolerance: Patient limited by fatigue;Patient tolerated treatment well Patient left: in chair;with chair alarm set;with call bell/phone within reach;with nursing/sitter in room Nurse Communication: Mobility status (O2) PT Visit Diagnosis: Muscle weakness (generalized) (M62.81);Difficulty in walking, not elsewhere classified (R26.2);Unsteadiness on feet (R26.81)    Time: 5320-2334 PT Time Calculation (min) (ACUTE ONLY): 37 min  Charges:   PT Evaluation $PT Eval Low Complexity: 1 Low PT Treatments $Therapeutic Activity: 23-37 mins        Lavone Nian, PT, DPT 12/27/20, 9:48 AM   Waunita Schooner 12/27/2020, 9:43 AM

## 2020-12-27 NOTE — Evaluation (Addendum)
Occupational Therapy Evaluation Patient Details Name: JADIRA NYGREN MRN: [id number] DOB: 23-Apr-1936 Today's Date: 12/27/2020    History of Present Illness Pt is an 85 y/o F admitted on 12/25/20 after presenting to ED with acute onset of generalized weakness for the last week & 1 day of fever with chills & mild cough. Pt reports positive antigen COVID test at home but both antigen & PCR tests were negative at hospital. Pt admitted for tx of sespsis of unknown source. 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 Prior to admission, pt was independent in all ADLs and functional mobility, and living in a 1-story home with her husband. Pt reports that she was driving last week, until her symptoms began. Pt currently requires MIN-MOD assist for LB bathing, sit<>stand toilet transfer/hygiene, and functional mobility with RW due to current functional impairments (See OT Problem List below). Pt and husband expressed desire to return home, with husband acknowledging and verbalizing ability to provide the level of assistance pt required this session; plan to have caregiver demo appropriate level of assistance needed next session. Pt would benefit from additional skilled OT services to maximize return to PLOF and minimize risk of future falls, injury, caregiver burden, and readmission. Upon discharge, recommend HHOT services and supervision/assistance with all OOB mobility.    Follow Up Recommendations  Home health OT;Supervision/Assistance - 24 hour    Equipment Recommendations  3 in 1 bedside commode       Precautions / Restrictions Precautions Precautions: Fall Restrictions Weight Bearing Restrictions: No      Mobility Bed Mobility Overal bed mobility: Needs Assistance Bed Mobility: Supine to Sit     Supine to sit: Min assist;HOB elevated     General bed mobility  comments: extra time    Transfers Overall transfer level: Needs assistance Equipment used: Rolling walker (2 wheeled) Transfers: Sit to/from [facility] Sit to Stand: Min assist Stand pivot transfers: Mod assist       General transfer comment: cuing for hand placement when transferring sit<>stand with RW, poor eccentric control with stand>sit    Balance Overall balance assessment: Needs assistance Sitting-balance support: No upper extremity supported;Feet supported Sitting balance-Leahy Scale: Fair Sitting balance - Comments: Sitting on BSC while washing/drying hands   Standing balance support: Bilateral upper extremity supported;During functional activity Standing balance-Leahy Scale: Poor Standing balance comment: BUE reliance on RW                           ADL either performed or assessed with clinical judgement   ADL Overall ADL's : Needs assistance/impaired     Grooming: Wash/dry hands;Set up;Sitting Grooming Details (indicate cue type and reason): Sitting on BSC     Lower Body Bathing: Sitting/lateral leans;Min guard Lower Body Bathing Details (indicate cue type and reason): Cleaning LE while sitting on [facility]         Toilet Transfer: Minimal assistance;Ambulation;BSC;RW Toilet Transfer Details (indicate cue type and reason): Able to take ~5 steps chair>BSC with RW Toileting- Clothing Manipulation and Hygiene: Moderate assistance;Sit to/from stand Toileting - Clothing Manipulation Details (indicate cue type and reason): Pt able to perform standing frontal peri-care with MIN GUARD, however required MAX A for posterior peri-care while standing with RW     Functional mobility during ADLs: Minimal assistance;Rolling walker       Vision  Perception     Praxis      Pertinent Vitals/Pain Pain Assessment: 0-10 Pain Score: 10-Worst pain ever Faces Pain Scale: Hurts little more Pain Location: pt with significant c/o aching  joints, specifically B hands & knees (R>L) Pain Descriptors / Indicators: Discomfort Pain Intervention(s): Premedicated before session;Monitored during session;Limited activity within patient's tolerance     Hand Dominance Right   Extremity/Trunk Assessment Upper Extremity Assessment Upper Extremity Assessment: Generalized weakness   Lower Extremity Assessment Lower Extremity Assessment: Defer to PT evaluation   Cervical / Trunk Assessment Cervical / Trunk Assessment: Normal   Communication Communication Communication: No difficulties (difficulty verbalizing at loud volume 2/2 "hoarse")   Cognition Arousal/Alertness: Awake/alert Behavior During Therapy: WFL for tasks assessed/performed Overall Cognitive Status: Within Functional Limits for tasks assessed                                 General Comments: AxOx4   General Comments  Pt moves at a very slow pace with all mobility tasks, occasionally internally distracted with conversation, able to be easily redirected to task at hand.    Exercises     Shoulder Instructions      Home Living Family/patient expects to be discharged to:: Private residence Living Arrangements: Spouse/significant other Available Help at Discharge: Family;Available 24 hours/day Type of Home: House Home Access: Stairs to enter [employer] of Steps: 4 Entrance Stairs-Rails:  (R or L rail depending upon which door she enters) Home Layout: One level     Bathroom Shower/Tub: Occupational psychologist: Handicapped height Bathroom Accessibility: Yes   Home Equipment: Environmental consultant - 2 wheels;Grab bars - toilet;Grab bars - tub/shower          Prior Functioning/Environment Level of Independence: Independent        Comments: Pt was independent without AD, driving until ~1 week ago when symptoms began.        OT Problem List: Decreased strength;Decreased activity tolerance;Impaired balance (sitting and/or standing)       OT Treatment/Interventions: Self-care/ADL training;Therapeutic exercise;Energy conservation;DME and/or AE instruction;Therapeutic activities;Patient/family education;Balance training    OT Goals(Current goals can be found in the care plan section) Acute Rehab OT Goals Patient Stated Goal: to go home OT Goal Formulation: With patient/family Time For Goal Achievement: 01/10/21 Potential to Achieve Goals: Fair ADL Goals Pt Will Perform Grooming: with supervision;standing Pt Will Transfer to Toilet: with supervision;ambulating;bedside commode Pt Will Perform Toileting - Clothing Manipulation and hygiene: with supervision;sit to/from stand  OT Frequency: Min 2X/week   Barriers to D/C:            Co-evaluation              AM-PAC OT "6 Clicks" Daily Activity     Outcome Measure Help from another person eating meals?: None Help from another person taking care of personal grooming?: A Little Help from another person toileting, which includes using toliet, bedpan, or urinal?: A Lot Help from another person bathing (including washing, rinsing, drying)?: A Lot Help from another person to put on and taking off regular upper body clothing?: A Little Help from another person to put on and taking off regular lower body clothing?: A Little 6 Click Score: 17   End of Session Equipment Utilized During Treatment: Gait belt;Rolling walker Nurse Communication: Mobility status  Activity Tolerance: Patient tolerated treatment well Patient left: in chair;with call bell/phone within reach;with chair alarm set  OT  Visit Diagnosis: Unsteadiness on feet (R26.81);Muscle weakness (generalized) (M62.81)                Time: 2751-7001 OT Time Calculation (min): 50 min Charges:  OT General Charges $OT Visit: 1 Visit OT Evaluation $OT Eval Moderate Complexity: 1 Mod OT Treatments $Self Care/Home Management : 38-52 mins  Fredirick Maudlin, OTR/L [place]

## 2020-12-27 NOTE — Progress Notes (Signed)
PHARMACIST - PHYSICIAN COMMUNICATION  CONCERNING:  Enoxaparin (Lovenox) for DVT Prophylaxis    RECOMMENDATION: Patient was prescribed enoxaprin 40mg  q24 hours for VTE prophylaxis.   Filed Weights   12/25/20 2204 12/26/20 0400 12/27/20 0435  Weight: 99.8 kg (220 lb) 99.8 kg (220 lb 0.3 oz) 99.7 kg (219 lb 14.5 oz)    Body mass index is 36.59 kg/m.  Estimated Creatinine Clearance: 39 mL/min (A) (by C-G formula based on SCr of 1.3 mg/dL (H)).   Based on [place] patient is candidate for enoxaparin 0.5mg /kg TBW SQ every 24 hours based on BMI being >30.   DESCRIPTION: Pharmacy has adjusted enoxaparin dose per [facility] policy.  Patient is now receiving enoxaparin 50mg  every [place], PharmD Pharmacy Resident  12/27/2020 10:40 AM

## 2020-12-28 DIAGNOSIS — I5023 Acute on chronic systolic (congestive) heart failure: Secondary | ICD-10-CM

## 2020-12-28 LAB — BASIC METABOLIC PANEL
Anion gap: 10 (ref 5–15)
BUN: 32 mg/dL — ABNORMAL HIGH (ref 8–23)
CO2: 20 mmol/L — ABNORMAL LOW (ref 22–32)
Calcium: 8 mg/dL — ABNORMAL LOW (ref 8.9–10.3)
Chloride: 101 mmol/L (ref 98–111)
Creatinine, Ser: 1.5 mg/dL — ABNORMAL HIGH (ref 0.44–1.00)
GFR, Estimated: 35 mL/min — ABNORMAL LOW (ref >60)
Glucose, Bld: 105 mg/dL — ABNORMAL HIGH (ref 70–99)
Potassium: 3.6 mmol/L (ref 3.5–5.1)
Sodium: 131 mmol/L — ABNORMAL LOW (ref 135–145)

## 2020-12-28 LAB — CBC WITH DIFFERENTIAL/PLATELET
Abs Immature Granulocytes: 0.04 10*3/uL (ref 0.00–0.07)
Basophils Absolute: 0 10*3/uL (ref 0.0–0.1)
Basophils Relative: 0 %
Eosinophils Absolute: 0.8 10*3/uL — ABNORMAL HIGH (ref 0.0–0.5)
Eosinophils Relative: 10 %
HCT: 34.9 % — ABNORMAL LOW (ref 36.0–46.0)
Hemoglobin: 11.5 g/dL — ABNORMAL LOW (ref 12.0–15.0)
Immature Granulocytes: 1 %
Lymphocytes Relative: 10 %
Lymphs Abs: 0.8 10*3/uL (ref 0.7–4.0)
MCH: 29.8 pg (ref 26.0–34.0)
MCHC: 33 g/dL (ref 30.0–36.0)
MCV: 90.4 fL (ref 80.0–100.0)
Monocytes Absolute: 0.4 10*3/uL (ref 0.1–1.0)
Monocytes Relative: 5 %
Neutro Abs: 6.2 10*3/uL (ref 1.7–7.7)
Neutrophils Relative %: 74 %
Platelets: 146 10*3/uL — ABNORMAL LOW (ref 150–400)
RBC: 3.86 MIL/uL — ABNORMAL LOW (ref 3.87–5.11)
RDW: 14.1 % (ref 11.5–15.5)
WBC: 8.3 10*3/uL (ref 4.0–10.5)
nRBC: 0 % (ref 0.0–0.2)

## 2020-12-28 LAB — MAGNESIUM: Magnesium: 2 mg/dL (ref 1.7–2.4)

## 2020-12-28 LAB — PHOSPHORUS: Phosphorus: 2.6 mg/dL (ref 2.5–4.6)

## 2020-12-28 MED ORDER — CARVEDILOL 3.125 MG PO TABS
6.2500 mg | ORAL_TABLET | Freq: Two times a day (BID) | ORAL | Status: DC
  Administered 2020-12-28 – 2020-12-30 (×4): 6.25 mg via ORAL
  Filled 2020-12-28: qty 2
  Filled 2020-12-28 (×2): qty 1
  Filled 2020-12-28: qty 2

## 2020-12-28 MED ORDER — CARVEDILOL 3.125 MG PO TABS: 3.1250 mg | ORAL_TABLET | Freq: Two times a day (BID) | ORAL | Status: DC

## 2020-12-28 MED ORDER — DIPHENHYDRAMINE HCL 25 MG PO CAPS: 25.0000 mg | ORAL_CAPSULE | Freq: Four times a day (QID) | ORAL | Status: DC | PRN

## 2020-12-28 NOTE — Care Management Important Message (Signed)
Important Message  Patient Details  Name: Julie Mcbride MRN: [id number] Date of Birth: 29-Sep-1936   Medicare Important Message Given:  Yes     Dannette Barbara 12/28/2020, 3:00 PM

## 2020-12-28 NOTE — Progress Notes (Incomplete)
[facility] Cardiology    SUBJECTIVE: ***   Vitals:   12/28/20 0400 12/28/20 0805 12/28/20 0806 12/28/20 1137  BP: 124/69 127/70  (!) 118/57  Pulse: 66   81  Resp: 10   17  Temp: 97.9 F (36.6 C) (!) 97.5 F (36.4 C) (!) 97.5 F (36.4 C) 97.6 F (36.4 C)  TempSrc: Axillary Oral Oral Oral  SpO2: 93%   93%  Weight: 103.9 kg     Height:         Intake/Output Summary (Last 24 hours) at 12/28/2020 1605 Last data filed at 12/28/2020 1453 Gross per 24 hour  Intake 353.98 ml  Output 1100 ml  Net -746.02 ml      PHYSICAL EXAM  General: Well developed, well nourished, in no acute distress HEENT:  Normocephalic and atramatic Neck:  No JVD.  Lungs: Clear bilaterally to auscultation and percussion. Heart: HRRR . Normal S1 and S2 without gallops or murmurs.  Abdomen: Bowel sounds are positive, abdomen soft and non-tender  Msk:  Back normal, normal gait. Normal strength and tone for age. Extremities: No clubbing, cyanosis or edema.   Neuro: Alert and oriented X 3. Psych:  Good affect, responds appropriately   LABS: Basic Metabolic Panel: Recent Labs    12/27/20 0449 12/28/20 0433  NA 134* 131*  K 3.3* 3.6  CL 106 101  CO2 19* 20*  GLUCOSE 93 105*  BUN 31* 32*  CREATININE 1.30* 1.50*  CALCIUM 7.4* 8.0*  MG 1.9 2.0  PHOS 2.2* 2.6   Liver Function Tests: No results for input(s): AST, ALT, ALKPHOS, BILITOT, PROT, ALBUMIN in the last 72 hours. No results for input(s): LIPASE, AMYLASE in the last 72 hours. CBC: Recent Labs    12/27/20 0449 12/28/20 0433  WBC 7.0 8.3  NEUTROABS  --  6.2  HGB 11.0* 11.5*  HCT 33.3* 34.9*  MCV 90.2 90.4  PLT 125* 146*   Cardiac Enzymes: No results for input(s): CKTOTAL, CKMB, CKMBINDEX, TROPONINI in the last 72 hours. BNP: Invalid input(s): POCBNP D-Dimer: No results for input(s): DDIMER in the last 72 hours. Hemoglobin A1C: No results for input(s): HGBA1C in the last 72 hours. Fasting Lipid Panel: No results for input(s): CHOL,  HDL, LDLCALC, TRIG, CHOLHDL, LDLDIRECT in the last 72 hours. Thyroid Function Tests: No results for input(s): TSH, T4TOTAL, T3FREE, THYROIDAB in the last 72 hours.  Invalid input(s): FREET3 Anemia Panel: No results for input(s): VITAMINB12, FOLATE, FERRITIN, TIBC, IRON, RETICCTPCT in the last 72 hours.  No results found.   Echo ***  TELEMETRY: ***:  ASSESSMENT AND PLAN:  Principal Problem:   Community acquired pneumonia Active Problems:   Essential hypertension   CKD (chronic kidney disease) stage 3, GFR 30-59 ml/min (HCC)   Sepsis (HCC)   Acute on chronic systolic CHF (congestive heart failure) ([place])    1. ***   Yolonda Kida, MD 12/28/2020 4:05 PM

## 2020-12-28 NOTE — TOC Progression Note (Signed)
Transition of Care [facility]) - Progression Note    Patient Details  Name: Julie Mcbride MRN: [id number] Date of Birth: January 08, 1936  Transition of Care [facility]) CM/SW [place], RN Phone Number: 12/28/2020, 1:24 PM  Clinical Narrative: Spoke with patient this morning, husband at the bedside about discharge needs. Discussed Home Health services, patient states she had one in the past, 2014 due to knee surgery. However do not feel it will be needed at this time. States OT assessed her and she was able to do all tasks successfully. Patient states she has all medical equipment needed at home and will not need help at all, husband agrees. Patient and husband advised to consult with PCP is services are needed at a later time, they voice understanding. No TOC needs at this time.           Expected Discharge Plan and Services                                                 Social Determinants of Health (SDOH) Interventions    Readmission Risk Interventions No flowsheet data found.

## 2020-12-28 NOTE — Progress Notes (Signed)
PROGRESS NOTE    Julie Mcbride  [id number] DOB: [birth year] DOA: 12/25/2020 PCP: Dion Body, MD   Chief complaint for shortness of breath. Brief Narrative:  [name] a84 y.o.Caucasian femalewith a known history of multiple medical problems that are mentioned below, who presented to the emergency room with acute onset of generalized weakness for the last week and recent fever today with associated chills, mild cough without dyspnea or wheezing. She has been having urinary frequency but denies dysuria or oliguria.She had a home antigen test that was positive for Covid. But after arriving the hospital, both antigen and PCR test are negative for Covid. Patient has elevated procalcitonin level, treated with cefepime and vancomycin.  Then changed to Rocephin and Zithromax.   Assessment & Plan:   Active Problems:   COVID-19   Sepsis ([place])   Community acquired pneumonia  #1.  Sepsis. Community-acquired pneumonia likely secondary to mycoplasma bacteria.  . Skin rash. Condition finally improving.  Patient short of breath much improved.  Skin rash became more macular and itching.  Added Benadryl.  Continue steroids. Continue antibiotics with Zithromax and Rocephin. Patient can be discharged home tomorrow.  2.  Elevated troponin secondary to demand ischemia due to sepsis. Acute on chronic systolic congestive heart failure. Ruled in. Patient condition has been stable.  Volume status much improved.  No additional Lasix is needed. Added Coreg 6.25 mg twice a day.  #3.  Hypokalemia and hypophosphatemia. Resolved.  4.  Mild thrombocytopenia. Stable and improved.  5.  Acute kidney injury on chronic kidney disease stage IIIa. Reviewed previous labs. Renal function stable.     DVT prophylaxis: Lovenox Code Status: Full Family Communication: Husband at the bedside Disposition Plan:  .   Status is: Inpatient  Remains inpatient appropriate because:Inpatient  level of care appropriate due to severity of illness   Dispo: The patient is from: Home              Anticipated d/c is to: Home              Anticipated d/c date is: 1 day              Patient currently is not medically stable to d/c.   Difficult to place patient No        I/O last 3 completed shifts: In: 1211.1 [P.O.:560; I.V.:159.6; IV Piggyback:491.6] Out: 1500 [Urine:1500] No intake/output data recorded.     Consultants:   None  Procedures: None  Antimicrobials:None  Subjective: Patient much improved.  Skin rash started itching.  Shortness of breath improved.  Joint pain is much better. No abdominal pain or nausea vomiting. Not dysuria hematuria pain No fever chills.  Objective: Vitals:   12/28/20 0400 12/28/20 0805 12/28/20 0806 12/28/20 1137  BP: 124/69 127/70  (!) 118/57  Pulse: 66   81  Resp: 10   17  Temp: 97.9 F (36.6 C) (!) 97.5 F (36.4 C) (!) 97.5 F (36.4 C) 97.6 F (36.4 C)  TempSrc: Axillary Oral Oral Oral  SpO2: 93%   93%  Weight: 103.9 kg     Height:        Intake/Output Summary (Last 24 hours) at 12/28/2020 1159 Last data filed at 12/28/2020 0500 Gross per 24 hour  Intake 353.98 ml  Output 700 ml  Net -346.02 ml   Filed Weights   12/26/20 0400 12/27/20 0435 12/28/20 0400  Weight: 99.8 kg 99.7 kg 103.9 kg    Examination:  General exam: Appears calm and comfortable  Respiratory system: Clear to auscultation. Respiratory effort normal. Cardiovascular system: S1 & S2 heard, RRR. No JVD, murmurs, rubs, gallops or clicks. No pedal edema. Gastrointestinal system: Abdomen is nondistended, soft and nontender. No organomegaly or masses felt. Normal bowel sounds heard. Central nervous system: Alert and oriented. No focal neurological deficits. Extremities: Symmetric 5 x 5 power. Skin: Macular skin rash present. Psychiatry: Judgement and insight appear normal. Mood & affect appropriate.     Data Reviewed: I have personally reviewed  following labs and imaging studies  CBC: Recent Labs  Lab 12/25/20 0020 12/26/20 0446 12/27/20 0449 12/28/20 0433  WBC 10.6* 8.0 7.0 8.3  NEUTROABS  --   --   --  6.2  HGB 13.7 12.2 11.0* 11.5*  HCT 41.2 37.5 33.3* 34.9*  MCV 90.7 92.4 90.2 90.4  PLT 151 135* 125* 086*   Basic Metabolic Panel: Recent Labs  Lab 12/25/20 0020 12/25/20 0846 12/25/20 1805 12/26/20 0446 12/27/20 0449 12/28/20 0433  NA 134* 135  --  137 134* 131*  K 2.9* 2.8* 3.6 3.8 3.3* 3.6  CL 98 107  --  110 106 101  CO2 22 17*  --  17* 19* 20*  GLUCOSE 117* 122*  --  83 93 105*  BUN 34* 35*  --  34* 31* 32*  CREATININE 1.64* 1.40*  --  1.27* 1.30* 1.50*  CALCIUM 8.2* 7.0*  --  7.5* 7.4* 8.0*  MG  --  1.9  --  2.2 1.9 2.0  PHOS  --  2.4*  --   --  2.2* 2.6   GFR: Estimated Creatinine Clearance: 34.6 mL/min (A) (by C-G formula based on SCr of 1.5 mg/dL (H)). Liver Function Tests: No results for input(s): AST, ALT, ALKPHOS, BILITOT, PROT, ALBUMIN in the last 168 hours. No results for input(s): LIPASE, AMYLASE in the last 168 hours. No results for input(s): AMMONIA in the last 168 hours. Coagulation Profile: Recent Labs  Lab 12/25/20 0147 12/26/20 0446  INR 1.1 1.2   Cardiac Enzymes: Recent Labs  Lab 12/25/20 0646  CKTOTAL 728*   BNP (last 3 results) No results for input(s): PROBNP in the last 8760 hours. HbA1C: No results for input(s): HGBA1C in the last 72 hours. CBG: Recent Labs  Lab 12/25/20 2207  GLUCAP 92   Lipid Profile: No results for input(s): CHOL, HDL, LDLCALC, TRIG, CHOLHDL, LDLDIRECT in the last 72 hours. Thyroid Function Tests: No results for input(s): TSH, T4TOTAL, FREET4, T3FREE, THYROIDAB in the last 72 hours. Anemia Panel: No results for input(s): VITAMINB12, FOLATE, FERRITIN, TIBC, IRON, RETICCTPCT in the last 72 hours. Sepsis Labs: Recent Labs  Lab 12/25/20 0027 12/25/20 0147 12/25/20 0319 12/26/20 0446  PROCALCITON  --   --  2.59 2.17  LATICACIDVEN 2.0*  1.7  --   --     Recent Results (from the past 240 hour(s))  Culture, blood (Routine X 2) w Reflex to ID Panel     Status: None (Preliminary result)   Collection Time: 12/25/20 12:27 AM   Specimen: Right Antecubital; Blood  Result Value Ref Range Status   Specimen Description RIGHT ANTECUBITAL  Final   Special Requests   Final    BOTTLES DRAWN AEROBIC AND ANAEROBIC Blood Culture adequate volume   Culture   Final    NO GROWTH 3 DAYS Performed at [facility], [address] [place], [place] [address]    Report Status PENDING  Incomplete  Culture, blood (Routine X 2) w Reflex to ID Panel  Status: None (Preliminary result)   Collection Time: 12/25/20  1:46 AM   Specimen: BLOOD  Result Value Ref Range Status   Specimen Description BLOOD LEFT FOREARM  Final   Special Requests   Final    BOTTLES DRAWN AEROBIC AND ANAEROBIC Blood Culture results may not be optimal due to an inadequate volume of blood received in culture bottles   Culture   Final    NO GROWTH 3 DAYS Performed at [facility], [address], [place], [place] [address]    Report Status PENDING  Incomplete  SARS Coronavirus 2 by RT PCR (hospital order, performed in [place] hospital lab) Nasopharyngeal Nasopharyngeal Swab     Status: None   Collection Time: 12/25/20  4:51 AM   Specimen: Nasopharyngeal Swab  Result Value Ref Range Status   SARS Coronavirus 2 NEGATIVE NEGATIVE Final    Comment: (NOTE) SARS-CoV-2 target nucleic acids are NOT DETECTED.  The SARS-CoV-2 RNA is generally detectable in upper and lower respiratory specimens during the acute phase of infection. The lowest concentration of SARS-CoV-2 viral copies this assay can detect is 250 copies / mL. A negative result does not preclude SARS-CoV-2 infection and should not be used as the sole basis for treatment or other patient management decisions.  A negative result may occur with improper specimen collection / handling,  submission of specimen other than nasopharyngeal swab, presence of viral mutation(s) within the areas targeted by this assay, and inadequate number of viral copies (<250 copies / mL). A negative result must be combined with clinical observations, patient history, and epidemiological information.  Fact Sheet for Patients:   StrictlyIdeas.no  Fact Sheet for Healthcare Providers: BankingDealers.co.za  This test is not yet approved or  cleared by the [place] FDA and has been authorized for detection and/or diagnosis of SARS-CoV-2 by FDA under an Emergency Use Authorization (EUA).  This EUA will remain in effect (meaning this test can be used) for the duration of the COVID-19 declaration under Section 564(b)(1) of the Act, 21 U.S.C. section 360bbb-3(b)(1), unless the authorization is terminated or revoked sooner.  Performed at [facility], [address] [place], [place] [address]   Urine Culture     Status: Abnormal   Collection Time: 12/25/20  6:15 AM   Specimen: Urine, Clean Catch  Result Value Ref Range Status   Specimen Description   Final    URINE, CLEAN CATCH Performed at [facility], [address], [place], [place] [address]    Special Requests   Final    NONE Performed at [facility], [address], [place], [place] [address]    Culture (A)  Final    <10,000 COLONIES/mL INSIGNIFICANT GROWTH Performed at [facility] [address], [place], [place] [address]    Report Status 12/26/2020 FINAL  Final  Respiratory (~20 pathogens) panel by PCR     Status: None   Collection Time: 12/25/20 12:21 PM   Specimen: Nasopharyngeal Swab; Respiratory  Result Value Ref Range Status   Adenovirus NOT DETECTED NOT DETECTED Final   Coronavirus 229E NOT DETECTED NOT DETECTED Final    Comment: (NOTE) The Coronavirus on the Respiratory Panel, DOES NOT test for the novel  Coronavirus (2019  nCoV)    Coronavirus HKU1 NOT DETECTED NOT DETECTED Final   Coronavirus NL63 NOT DETECTED NOT DETECTED Final   Coronavirus OC43 NOT DETECTED NOT DETECTED Final   Metapneumovirus NOT DETECTED NOT DETECTED Final   Rhinovirus / Enterovirus  NOT DETECTED NOT DETECTED Final   Influenza A NOT DETECTED NOT DETECTED Final   Influenza B NOT DETECTED NOT DETECTED Final   Parainfluenza Virus 1 NOT DETECTED NOT DETECTED Final   Parainfluenza Virus 2 NOT DETECTED NOT DETECTED Final   Parainfluenza Virus 3 NOT DETECTED NOT DETECTED Final   Parainfluenza Virus 4 NOT DETECTED NOT DETECTED Final   Respiratory Syncytial Virus NOT DETECTED NOT DETECTED Final   Bordetella pertussis NOT DETECTED NOT DETECTED Final   Bordetella Parapertussis NOT DETECTED NOT DETECTED Final   Chlamydophila pneumoniae NOT DETECTED NOT DETECTED Final   Mycoplasma pneumoniae NOT DETECTED NOT DETECTED Final    Comment: Performed at [facility], [place] [address], [place], [place] [address]         Radiology Studies: No results found.      Scheduled Meds: . bisoprolol-hydrochlorothiazide  1 tablet Oral Daily  . cholecalciferol  4,000 Units Oral Daily  . clopidogrel  75 mg Oral Daily  . enoxaparin (LOVENOX) injection  0.5 mg/kg Subcutaneous Q24H  . feeding supplement  237 mL Oral BID BM  . guaiFENesin  600 mg Oral BID  . hydrALAZINE  50 mg Oral TID  . Ipratropium-Albuterol  1 puff Inhalation Q6H  . magic mouthwash w/lidocaine  10 mL Oral QID  . multivitamin with minerals  1 tablet Oral Daily  . predniSONE  50 mg Oral Q breakfast  . rOPINIRole  0.5 mg Oral QHS   Continuous Infusions: . azithromycin 500 mg (12/27/20 1300)  . cefTRIAXone (ROCEPHIN)  IV 2 g (12/28/20 1156)     LOS: 3 days    Time spent: 28 minutes    Sharen Hones, MD [facility]   To contact the attending provider between 7A-7P or the covering provider during after hours 7P-7A, please log into the web site [email or web address] and  access using universal [place] password for that web site. If you do not have the password, please call the hospital operator.  12/28/2020, 11:59 AM

## 2020-12-28 NOTE — Progress Notes (Signed)
Pt c/o painful mouth.  Tongue appears red and inflamed.  MD notified.  Magic mouthwash w/ lidocaine ordered qid.

## 2020-12-29 LAB — CBC WITH DIFFERENTIAL/PLATELET
Abs Immature Granulocytes: 0.16 10*3/uL — ABNORMAL HIGH (ref 0.00–0.07)
Basophils Absolute: 0 10*3/uL (ref 0.0–0.1)
Basophils Relative: 0 %
Eosinophils Absolute: 0.1 10*3/uL (ref 0.0–0.5)
Eosinophils Relative: 1 %
HCT: 33.9 % — ABNORMAL LOW (ref 36.0–46.0)
Hemoglobin: 11.4 g/dL — ABNORMAL LOW (ref 12.0–15.0)
Immature Granulocytes: 1 %
Lymphocytes Relative: 11 %
Lymphs Abs: 1.3 10*3/uL (ref 0.7–4.0)
MCH: 30 pg (ref 26.0–34.0)
MCHC: 33.6 g/dL (ref 30.0–36.0)
MCV: 89.2 fL (ref 80.0–100.0)
Monocytes Absolute: 0.6 10*3/uL (ref 0.1–1.0)
Monocytes Relative: 5 %
Neutro Abs: 9.8 10*3/uL — ABNORMAL HIGH (ref 1.7–7.7)
Neutrophils Relative %: 82 %
Platelets: 187 10*3/uL (ref 150–400)
RBC: 3.8 MIL/uL — ABNORMAL LOW (ref 3.87–5.11)
RDW: 13.8 % (ref 11.5–15.5)
Smear Review: NORMAL
WBC: 11.9 10*3/uL — ABNORMAL HIGH (ref 4.0–10.5)
nRBC: 0 % (ref 0.0–0.2)

## 2020-12-29 LAB — BASIC METABOLIC PANEL
Anion gap: 9 (ref 5–15)
BUN: 33 mg/dL — ABNORMAL HIGH (ref 8–23)
CO2: 23 mmol/L (ref 22–32)
Calcium: 8.4 mg/dL — ABNORMAL LOW (ref 8.9–10.3)
Chloride: 103 mmol/L (ref 98–111)
Creatinine, Ser: 1.11 mg/dL — ABNORMAL HIGH (ref 0.44–1.00)
GFR, Estimated: 50 mL/min — ABNORMAL LOW (ref >60)
Glucose, Bld: 135 mg/dL — ABNORMAL HIGH (ref 70–99)
Potassium: 4 mmol/L (ref 3.5–5.1)
Sodium: 135 mmol/L (ref 135–145)

## 2020-12-29 LAB — MAGNESIUM: Magnesium: 2.2 mg/dL (ref 1.7–2.4)

## 2020-12-29 MED ORDER — AZITHROMYCIN 500 MG PO TABS
500.0000 mg | ORAL_TABLET | Freq: Every day | ORAL | Status: DC
  Administered 2020-12-30: 500 mg via ORAL
  Filled 2020-12-29: qty 1

## 2020-12-29 MED ORDER — PREDNISONE 20 MG PO TABS
20.0000 mg | ORAL_TABLET | Freq: Every day | ORAL | Status: DC
  Administered 2020-12-30: 20 mg via ORAL
  Filled 2020-12-29: qty 1

## 2020-12-29 NOTE — Progress Notes (Signed)
Occupational Therapy Treatment Patient Details Name: Julie Mcbride MRN: [id number] DOB: 05/06/1936 Today's Date: 12/29/2020    History of present illness Pt is an 85 y/o F admitted on 12/25/20 after presenting to ED with acute onset of generalized weakness for the last week & 1 day of fever with chills & mild cough. Pt reports positive antigen COVID test at home but both antigen & PCR tests were negative at hospital. Pt admitted for tx of sespsis of unknown source. 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 Upon arrival to room pt and husband reporting desire to return home, agreeable to caregiver education and trial assisting with mobility. Husband demonstrated appropriate body mechanics and safety for sit<>stand trial with RW. Pt requried MIN A + RW for ADL t/f. SBA + RW for ~10 mins standing grooming tasks. Pt making good progress toward goals. Pt continues to benefit from skilled OT services to maximize return to PLOF and minimize risk of future falls, injury, caregiver burden, and readmission. Will continue to follow POC. Discharge recommendation remains appropriate.    Follow Up Recommendations  Home health OT;Supervision/Assistance - 24 hour    Equipment Recommendations  3 in 1 bedside commode    Recommendations for Other Services      Precautions / Restrictions Precautions Precautions: Fall Restrictions Weight Bearing Restrictions: No       Mobility Bed Mobility Overal bed mobility: Needs Assistance Bed Mobility: Supine to Sit;Sit to Supine     Supine to sit: Min guard;HOB elevated Sit to supine: Min guard;HOB elevated      Transfers Overall transfer level: Needs assistance Equipment used: Rolling walker (2 wheeled) Transfers: Sit to/from Stand Sit to Stand: Min assist              Balance Overall balance assessment: Needs  assistance Sitting-balance support: No upper extremity supported;Feet supported Sitting balance-Leahy Scale: Fair     Standing balance support: No upper extremity supported;During functional activity Standing balance-Leahy Scale: Fair                             ADL either performed or assessed with clinical judgement   ADL Overall ADL's : Needs assistance/impaired                                       General ADL Comments: MIN A + RW for ADL t/f. SBA + RW for ~10 mins standing grooming tasks.               Cognition Arousal/Alertness: Awake/alert Behavior During Therapy: WFL for tasks assessed/performed Overall Cognitive Status: Within Functional Limits for tasks assessed                                 General Comments: AxOx4        Exercises Exercises: Other exercises Other Exercises Other Exercises: Pt educated re: OT role, DME recs, d/c recs, falls prevention, ECS Other Exercises: Grooming, sup<>sit, sit<>stand, sitting/standing balance/tolerance     Pertinent Vitals/ Pain       Pain Assessment: No/denies pain         Frequency  Min 2X/week  Progress Toward Goals  OT Goals(current goals can now be found in the care plan section)  Progress towards OT goals: Progressing toward goals  Acute Rehab OT Goals Patient Stated Goal: to go home OT Goal Formulation: With patient/family Time For Goal Achievement: 01/10/21 Potential to Achieve Goals: Fair ADL Goals Pt Will Perform Grooming: with supervision;standing Pt Will Transfer to Toilet: with supervision;ambulating;bedside commode Pt Will Perform Toileting - Clothing Manipulation and hygiene: with supervision;sit to/from stand  Plan Discharge plan remains appropriate;Frequency remains appropriate       AM-PAC OT "6 Clicks" Daily Activity     Outcome Measure   Help from another person eating meals?: None Help from another person taking care of personal  grooming?: A Little Help from another person toileting, which includes using toliet, bedpan, or urinal?: A Little Help from another person bathing (including washing, rinsing, drying)?: A Lot Help from another person to put on and taking off regular upper body clothing?: A Little Help from another person to put on and taking off regular lower body clothing?: A Little 6 Click Score: 18    End of Session Equipment Utilized During Treatment: Rolling walker  OT Visit Diagnosis: Unsteadiness on feet (R26.81);Muscle weakness (generalized) (M62.81)   Activity Tolerance Patient tolerated treatment well   Patient Left in bed;with call bell/phone within reach;with bed alarm set;with nursing/sitter in room;with family/visitor present   Nurse Communication          Time: 1610-9604 OT Time Calculation (min): 27 min  Charges: OT General Charges $OT Visit: 1 Visit OT Treatments $Self Care/Home Management : 8-22 mins $Therapeutic Activity: 8-22 mins  Dessie Coma, M.S. OTR/L  12/29/20, 4:15 PM  ascom [phone]

## 2020-12-29 NOTE — Progress Notes (Signed)
Mobility Specialist - Progress Note   12/29/20 1253  Mobility  Activity Ambulated in room;Dangled on edge of bed  Level of Assistance Minimal assist, patient does 75% or more  Assistive Device Front wheel walker  Distance Ambulated (ft) 40 ft  Mobility Response Tolerated well  Mobility performed by Mobility specialist  $Mobility charge 1 Mobility    Pre-mobility: 75 HR, 94% SpO2 During mobility: 83 HR, 91% SpO2 Post-mobility: 79 HR, 94% SpO2   Pt was sitting EOB with spouse in room. Pt agreed to session. Pt requesting assistance with grooming tasks (dry bath, gown change, etc). Mobility provided set-up. Pt was able to doff socks and gown independently. Pt required minA to don socks. O2 > 94% with grooming tasks. Pt agreed to trial ambulation after tasks. Pt stood to RW with minA and progressed to ambulating 40' in room with no LOB noted. Pt states that she uses a RW at home. O2 desat to 91% during first 66' and pt was educated on PLB which was utilized the remainder of session. Pt's O2 sat up to high 90s and remained in good sats throughout remainder of activity. Pt did state that she felt a little weak in LE during ambulation, but much improved from when she was first admitted. Overall, pt tolerated session well. Pt returned supine and was able to reposition self to [facility]. Pt left in bed with all needs in reach and alarm set. Nurse notified.    Kathee Delton Mobility Specialist 12/29/20, 1:02 PM

## 2020-12-29 NOTE — Progress Notes (Addendum)
PROGRESS NOTE    Julie Mcbride  [id number] DOB: [birth year] DOA: 12/25/2020 PCP: Dion Body, MD   Chief complaint.  Shortness of breath. Brief Narrative:  [name] a84 y.o.Caucasian femalewith a known history of multiple medical problems that are mentioned below, who presented to the emergency room with acute onset of generalized weakness for the last week and recent fever today with associated chills, mild cough without dyspnea or wheezing. She has been having urinary frequency but denies dysuria or oliguria.She had a home antigen test that was positive for Covid. But after arriving the hospital, both antigen and PCR test are negative for Covid.  Respiratory pathogens are also negative. Patient has elevated procalcitonin level, treated with cefepime andvancomycin.Then changed to Rocephin and Zithromax. Per family, pt did not have a positive covid test at home.    Assessment & Plan:   Principal Problem:   Community acquired pneumonia Active Problems:   Essential hypertension   CKD (chronic kidney disease) stage 3, GFR 30-59 ml/min (HCC)   Sepsis (HCC)   Acute on chronic systolic CHF (congestive heart failure) ([place])  #1. Sepsis secondary to mycoplasma pneumonia. Community-acquired pneumonia likely secondary to mycoplasma bacteria. . Skin rashes. Patient blood cultures so far has been negative.  Due to pneumonia associated with skin rash, diagnosis was presumed to be mycoplasma pneumonia. She has a finished 5 days IV Rocephin and a Zithromax.  I will continue oral Zithromax for additional 2 days. Patient was also placed steroids for skin rash which is improving greatly.  I wean down the steroids. Patient has refused nursing home placement, she also refused to have any nursing visit to home. She has not been moving in the hospital, she just been seen by physical therapy once.  She does not feel she can go home today.  We will continue to follow her in the  hospital for 1 to 2 days.  I spoke with the nurse, will have physical therapy/Occupational Therapy see patient on a daily basis.  #2.  Elevated troponin secondary to demand ischemia due to sepsis. Acute on chronic systolic congestive heart failure. Echocardiogram showed ejection fraction 45 to 50%.  She received a Lasix, volume status much better.  She is off oxygen. Recheck BNP tomorrow.   3.  Acute kidney injury on chronic kidney disease stage IIIa. Renal function had improved.  4.  Thrombocytopenia mild, Secondary to sepsis.  Improved.  4.  Hypokalemia and hypophosphatemia. Resolved.  5. Morbid obesity.  6. Generalized weakness. Do not want SNF or home PT. May be acceptable to come to outpatient rehab center.     DVT prophylaxis: Lovenox Code Status: Full Family Communication: daughter updated Disposition Plan:  .   Status is: Inpatient  Remains inpatient appropriate because:Inpatient level of care appropriate due to severity of illness   Dispo: The patient is from: Home              Anticipated d/c is to: Home              Anticipated d/c date is: 1 day              Patient currently is not medically stable to d/c.   Difficult to place patient No        I/O last 3 completed shifts: In: 350 [IV Piggyback:350] Out: 2200 [Urine:2200] No intake/output data recorded.     Consultants:   none  Procedures: none  Antimicrobials: zithromax  Subjective: Patient feel much better today, she still feel  weak, she has not been ambulated.  But short of breath is much better, she has cough, nonproductive.  Sick skin rash is much better. Her appetite is also improving.  She has no diarrhea constipation.  She has no abdominal pain. No fever chills No dysuria hematuria.  Objective: Vitals:   12/28/20 1642 12/28/20 1938 12/29/20 0322 12/29/20 0841  BP: 132/62 132/65 (!) 148/68   Pulse: 76 79 68   Resp: 16 18 20    Temp: 98 F (36.7 C) 98.7 F (37.1 C) (!) 97.4  F (36.3 C) 97.6 F (36.4 C)  TempSrc: Oral Oral Oral Oral  SpO2: 95% 96% 96%   Weight:      Height:        Intake/Output Summary (Last 24 hours) at 12/29/2020 1206 Last data filed at 12/29/2020 0410 Gross per 24 hour  Intake 350 ml  Output 1500 ml  Net -1150 ml   Filed Weights   12/26/20 0400 12/27/20 0435 12/28/20 0400  Weight: 99.8 kg 99.7 kg 103.9 kg    Examination:  General exam: Appears calm and comfortable  Respiratory system: Clear to auscultation. Respiratory effort normal. Cardiovascular system: S1 & S2 heard, RRR. No JVD, murmurs, rubs, gallops or clicks. No pedal edema. Gastrointestinal system: Abdomen is nondistended, soft and nontender. No organomegaly or masses felt. Normal bowel sounds heard. Central nervous system: Alert and oriented. No focal neurological deficits. Extremities: Symmetric  Skin: Macular skin rashes are better. Psychiatry:  Mood & affect appropriate.     Data Reviewed: I have personally reviewed following labs and imaging studies  CBC: Recent Labs  Lab 12/25/20 0020 12/26/20 0446 12/27/20 0449 12/28/20 0433 12/29/20 0654  WBC 10.6* 8.0 7.0 8.3 11.9*  NEUTROABS  --   --   --  6.2 9.8*  HGB 13.7 12.2 11.0* 11.5* 11.4*  HCT 41.2 37.5 33.3* 34.9* 33.9*  MCV 90.7 92.4 90.2 90.4 89.2  PLT 151 135* 125* 146* 161   Basic Metabolic Panel: Recent Labs  Lab 12/25/20 0846 12/25/20 1805 12/26/20 0446 12/27/20 0449 12/28/20 0433 12/29/20 0654  NA 135  --  137 134* 131* 135  K 2.8* 3.6 3.8 3.3* 3.6 4.0  CL 107  --  110 106 101 103  CO2 17*  --  17* 19* 20* 23  GLUCOSE 122*  --  83 93 105* 135*  BUN 35*  --  34* 31* 32* 33*  CREATININE 1.40*  --  1.27* 1.30* 1.50* 1.11*  CALCIUM 7.0*  --  7.5* 7.4* 8.0* 8.4*  MG 1.9  --  2.2 1.9 2.0 2.2  PHOS 2.4*  --   --  2.2* 2.6  --    GFR: Estimated Creatinine Clearance: 46.8 mL/min (A) (by C-G formula based on SCr of 1.11 mg/dL (H)). Liver Function Tests: No results for input(s): AST, ALT,  ALKPHOS, BILITOT, PROT, ALBUMIN in the last 168 hours. No results for input(s): LIPASE, AMYLASE in the last 168 hours. No results for input(s): AMMONIA in the last 168 hours. Coagulation Profile: Recent Labs  Lab 12/25/20 0147 12/26/20 0446  INR 1.1 1.2   Cardiac Enzymes: Recent Labs  Lab 12/25/20 0646  CKTOTAL 728*   BNP (last 3 results) No results for input(s): PROBNP in the last 8760 hours. HbA1C: No results for input(s): HGBA1C in the last 72 hours. CBG: Recent Labs  Lab 12/25/20 2207  GLUCAP 92   Lipid Profile: No results for input(s): CHOL, HDL, LDLCALC, TRIG, CHOLHDL, LDLDIRECT in the last 72 hours.  Thyroid Function Tests: No results for input(s): TSH, T4TOTAL, FREET4, T3FREE, THYROIDAB in the last 72 hours. Anemia Panel: No results for input(s): VITAMINB12, FOLATE, FERRITIN, TIBC, IRON, RETICCTPCT in the last 72 hours. Sepsis Labs: Recent Labs  Lab 12/25/20 0027 12/25/20 0147 12/25/20 0319 12/26/20 0446  PROCALCITON  --   --  2.59 2.17  LATICACIDVEN 2.0* 1.7  --   --     Recent Results (from the past 240 hour(s))  Culture, blood (Routine X 2) w Reflex to ID Panel     Status: None (Preliminary result)   Collection Time: 12/25/20 12:27 AM   Specimen: Right Antecubital; Blood  Result Value Ref Range Status   Specimen Description RIGHT ANTECUBITAL  Final   Special Requests   Final    BOTTLES DRAWN AEROBIC AND ANAEROBIC Blood Culture adequate volume   Culture   Final    NO GROWTH 4 DAYS Performed at [facility], [address], [place], [place] [address]    Report Status PENDING  Incomplete  Culture, blood (Routine X 2) w Reflex to ID Panel     Status: None (Preliminary result)   Collection Time: 12/25/20  1:46 AM   Specimen: BLOOD  Result Value Ref Range Status   Specimen Description BLOOD LEFT FOREARM  Final   Special Requests   Final    BOTTLES DRAWN AEROBIC AND ANAEROBIC Blood Culture results may not be optimal due to an inadequate  volume of blood received in culture bottles   Culture   Final    NO GROWTH 4 DAYS Performed at [facility], [address] [place], [place] [address]    Report Status PENDING  Incomplete  SARS Coronavirus 2 by RT PCR (hospital order, performed in [place] hospital lab) Nasopharyngeal Nasopharyngeal Swab     Status: None   Collection Time: 12/25/20  4:51 AM   Specimen: Nasopharyngeal Swab  Result Value Ref Range Status   SARS Coronavirus 2 NEGATIVE NEGATIVE Final    Comment: (NOTE) SARS-CoV-2 target nucleic acids are NOT DETECTED.  The SARS-CoV-2 RNA is generally detectable in upper and lower respiratory specimens during the acute phase of infection. The lowest concentration of SARS-CoV-2 viral copies this assay can detect is 250 copies / mL. A negative result does not preclude SARS-CoV-2 infection and should not be used as the sole basis for treatment or other patient management decisions.  A negative result may occur with improper specimen collection / handling, submission of specimen other than nasopharyngeal swab, presence of viral mutation(s) within the areas targeted by this assay, and inadequate number of viral copies (<250 copies / mL). A negative result must be combined with clinical observations, patient history, and epidemiological information.  Fact Sheet for Patients:   StrictlyIdeas.no  Fact Sheet for Healthcare Providers: BankingDealers.co.za  This test is not yet approved or  cleared by the [place] FDA and has been authorized for detection and/or diagnosis of SARS-CoV-2 by FDA under an Emergency Use Authorization (EUA).  This EUA will remain in effect (meaning this test can be used) for the duration of the COVID-19 declaration under Section 564(b)(1) of the Act, 21 U.S.C. section 360bbb-3(b)(1), unless the authorization is terminated or revoked sooner.  Performed at [facility], [address], [place], [place] [address]   Urine Culture     Status: Abnormal   Collection Time: 12/25/20  6:15 AM   Specimen: Urine, Clean Catch  Result Value Ref Range Status   Specimen Description  Final    URINE, CLEAN CATCH Performed at [facility], [address] [place], [place] [address]    Special Requests   Final    NONE Performed at [facility], [address] [place], [place] [address]    Culture (A)  Final    <10,000 COLONIES/mL INSIGNIFICANT GROWTH Performed at [facility] [address] [place], [place] [address]    Report Status 12/26/2020 FINAL  Final  Respiratory (~20 pathogens) panel by PCR     Status: None   Collection Time: 12/25/20 12:21 PM   Specimen: Nasopharyngeal Swab; Respiratory  Result Value Ref Range Status   Adenovirus NOT DETECTED NOT DETECTED Final   Coronavirus 229E NOT DETECTED NOT DETECTED Final    Comment: (NOTE) The Coronavirus on the Respiratory Panel, DOES NOT test for the novel  Coronavirus (2019 nCoV)    Coronavirus HKU1 NOT DETECTED NOT DETECTED Final   Coronavirus NL63 NOT DETECTED NOT DETECTED Final   Coronavirus OC43 NOT DETECTED NOT DETECTED Final   Metapneumovirus NOT DETECTED NOT DETECTED Final   Rhinovirus / Enterovirus NOT DETECTED NOT DETECTED Final   Influenza A NOT DETECTED NOT DETECTED Final   Influenza B NOT DETECTED NOT DETECTED Final   Parainfluenza Virus 1 NOT DETECTED NOT DETECTED Final   Parainfluenza Virus 2 NOT DETECTED NOT DETECTED Final   Parainfluenza Virus 3 NOT DETECTED NOT DETECTED Final   Parainfluenza Virus 4 NOT DETECTED NOT DETECTED Final   Respiratory Syncytial Virus NOT DETECTED NOT DETECTED Final   Bordetella pertussis NOT DETECTED NOT DETECTED Final   Bordetella Parapertussis NOT DETECTED NOT DETECTED Final   Chlamydophila pneumoniae NOT DETECTED NOT DETECTED Final   Mycoplasma pneumoniae NOT DETECTED NOT DETECTED Final    Comment: Performed at [facility], [place] [address] [place], [place] [address]         Radiology Studies: No results found.      Scheduled Meds: . [START ON 12/30/2020] azithromycin  500 mg Oral Daily  . carvedilol  6.25 mg Oral BID WC  . cholecalciferol  4,000 Units Oral Daily  . clopidogrel  75 mg Oral Daily  . enoxaparin (LOVENOX) injection  0.5 mg/kg Subcutaneous Q24H  . feeding supplement  237 mL Oral BID BM  . guaiFENesin  600 mg Oral BID  . hydrALAZINE  50 mg Oral TID  . Ipratropium-Albuterol  1 puff Inhalation Q6H  . magic mouthwash w/lidocaine  10 mL Oral QID  . multivitamin with minerals  1 tablet Oral Daily  . predniSONE  50 mg Oral Q breakfast  . rOPINIRole  0.5 mg Oral QHS   Continuous Infusions: . azithromycin 500 mg (12/28/20 1244)  . cefTRIAXone (ROCEPHIN)  IV 2 g (12/28/20 1156)     LOS: 4 days    Time spent: 45 minutes, more than 50% time on direct patient care.     Sharen Hones, MD [facility]   To contact the attending provider between 7A-7P or the covering provider during after hours 7P-7A, please log into the web site [email or web address] and access using universal [place] password for that web site. If you do not have the password, please call the hospital operator.  12/29/2020, 12:06 PM

## 2020-12-30 DIAGNOSIS — J189 Pneumonia, unspecified organism: Secondary | ICD-10-CM

## 2020-12-30 LAB — CULTURE, BLOOD (ROUTINE X 2)
Culture: NO GROWTH
Culture: NO GROWTH
Special Requests: ADEQUATE

## 2020-12-30 LAB — BRAIN NATRIURETIC PEPTIDE: B Natriuretic Peptide: 354.5 pg/mL — ABNORMAL HIGH (ref 0.0–100.0)

## 2020-12-30 MED ORDER — PREDNISONE 20 MG PO TABS: 20.0000 mg | ORAL_TABLET | Freq: Every day | ORAL | 0 refills | Status: AC

## 2020-12-30 MED ORDER — GUAIFENESIN ER 600 MG PO TB12: 600.0000 mg | ORAL_TABLET | Freq: Two times a day (BID) | ORAL | 0 refills | Status: AC | PRN

## 2020-12-30 MED ORDER — POLYETHYLENE GLYCOL 3350 17 G PO PACK: 34.0000 g | PACK | ORAL | 0 refills | Status: AC

## 2020-12-30 MED ORDER — MAGIC MOUTHWASH W/LIDOCAINE: 10.0000 mL | Freq: Four times a day (QID) | ORAL | 0 refills | Status: AC | PRN

## 2020-12-30 NOTE — Plan of Care (Signed)
  Problem: Education: Goal: Knowledge of General Education information will improve Description: Including pain rating scale, medication(s)/side effects and non-pharmacologic comfort measures 12/30/2020 1139 by Orvan Seen, RN Outcome: Completed/Met 12/30/2020 0757 by Orvan Seen, RN Outcome: Progressing   Problem: Health Behavior/Discharge Planning: Goal: Ability to manage health-related needs will improve 12/30/2020 1139 by Orvan Seen, RN Outcome: Completed/Met 12/30/2020 0757 by Orvan Seen, RN Outcome: Progressing   Problem: Clinical Measurements: Goal: Ability to maintain clinical measurements within normal limits will improve 12/30/2020 1139 by Orvan Seen, RN Outcome: Completed/Met 12/30/2020 0757 by Orvan Seen, RN Outcome: Progressing Goal: Will remain free from infection 12/30/2020 1139 by Orvan Seen, RN Outcome: Completed/Met 12/30/2020 0757 by Orvan Seen, RN Outcome: Progressing Goal: Diagnostic test results will improve 12/30/2020 1139 by Orvan Seen, RN Outcome: Completed/Met 12/30/2020 0757 by Orvan Seen, RN Outcome: Progressing Goal: Respiratory complications will improve 12/30/2020 1139 by Orvan Seen, RN Outcome: Completed/Met 12/30/2020 0757 by Orvan Seen, RN Outcome: Progressing Goal: Cardiovascular complication will be avoided 12/30/2020 1139 by Orvan Seen, RN Outcome: Completed/Met 12/30/2020 0757 by Orvan Seen, RN Outcome: Progressing   Problem: Activity: Goal: Risk for activity intolerance will decrease 12/30/2020 1139 by Orvan Seen, RN Outcome: Completed/Met 12/30/2020 0757 by Orvan Seen, RN Outcome: Progressing   Problem: Nutrition: Goal: Adequate nutrition will be maintained 12/30/2020 1139 by Orvan Seen, RN Outcome: Completed/Met 12/30/2020 0757 by Orvan Seen, RN Outcome: Progressing   Problem: Coping: Goal: Level of anxiety will decrease 12/30/2020 1139 by Orvan Seen, RN Outcome: Completed/Met 12/30/2020 0757 by Orvan Seen, RN Outcome: Progressing   Problem: Elimination: Goal: Will not experience complications related to bowel motility 12/30/2020 1139 by Orvan Seen, RN Outcome: Completed/Met 12/30/2020 0757 by Orvan Seen, RN Outcome: Progressing Goal: Will not experience complications related to urinary retention 12/30/2020 1139 by Orvan Seen, RN Outcome: Completed/Met 12/30/2020 0757 by Orvan Seen, RN Outcome: Progressing   Problem: Pain Managment: Goal: General experience of comfort will improve 12/30/2020 1139 by Orvan Seen, RN Outcome: Completed/Met 12/30/2020 0757 by Orvan Seen, RN Outcome: Progressing   Problem: Safety: Goal: Ability to remain free from injury will improve 12/30/2020 1139 by Orvan Seen, RN Outcome: Completed/Met 12/30/2020 0757 by Orvan Seen, RN Outcome: Progressing   Problem: Skin Integrity: Goal: Risk for impaired skin integrity will decrease 12/30/2020 1139 by Orvan Seen, RN Outcome: Completed/Met 12/30/2020 0757 by Orvan Seen, RN Outcome: Progressing

## 2020-12-30 NOTE — Plan of Care (Signed)

## 2020-12-30 NOTE — Discharge Summary (Signed)
Physician Discharge Summary   Julie Mcbride  female DOB: Sep 18, 1936  [id number]  PCP: Dion Body, MD  Admit date: 12/25/2020 Discharge date: 12/30/2020  Admitted From: home Disposition:  Home. Husband updated at bedside prior to discharge.  Home Health: No.  Refused HH and DME recommendations. CODE STATUS: Full code  Discharge Instructions    Discharge instructions   Complete by: As directed    I prescribed you steroid prednisone to take for 3 more days at home, to help with rash and inflammation.    You have received 5 days of IV antibiotics for pneumonia.    Dr. Rebekah Chesterfield Course:  For full details, please see H&P, progress notes, consult notes and ancillary notes.  Briefly,  [name] a85 y.o.Caucasian femalewith a known history of HTN who presented to the emergency room with acute onset of generalized weakness for the last week and recent fever with associated chills, mild cough without dyspnea or wheezing.  She had a home antigen test that was positive for Covid. But after arriving the hospital, both antigen and PCR test are negative for Covid.  Respiratory pathogens are also negative.  Patient had elevated procalcitonin level, treated with cefepime andvancomycin.Then changed to Rocephin and Zithromax.  Sepsis secondary to mycoplasma pneumonia. Presented with fever, leukocytosis.  Patient blood cultures so far has been negative.  Due to pneumonia associated with skin rash, diagnosis was presumed to be mycoplasma pneumonia. She has a finished 5 days IV Rocephin and a Zithromax.  Patient was also placed steroids for skin rash which is improving greatly.  Pt is discharged on 3 more days of prednisone.  Elevated troponin secondary to demand ischemia due to sepsis. Trop 634 then down-trended.  No chest pain.  Cardiology consulted and agreed with dx of demand ischemia.  Systolic congestive heart failure, unknown  chronicity. Echocardiogram showed ejection fraction 45 to 50%.  No recent TTE seen on record.  She received IV Lasix 40 mg x2 doses.  Acute kidney injury on chronic kidney disease stage IIIa. Cr 1.64 on presentation.  Cr 1.11 on the day of discharge.  Thrombocytopenia mild, Secondary to sepsis.  Improved.  Hypokalemia and hypophosphatemia. Resolved.  Morbid obesity.  Generalized weakness. Do not want SNF or home PT.    Discharge Diagnoses:  Principal Problem:   Community acquired pneumonia Active Problems:   Essential hypertension   CKD (chronic kidney disease) stage 3, GFR 30-59 ml/min (HCC)   Sepsis (HCC)   Acute on chronic systolic CHF (congestive heart failure) ([place])    Discharge Instructions:  Allergies as of 12/30/2020      Reactions   Chlorhexidine Hives   SAGE wipes caused severe rash   Cyclobenzaprine Hcl    Doesn't remember reaction   Etodolac    Doesn't remember reaction   Lisinopril    Coughing up blood   Meloxicam    Doesn't remember reaction   Metaxalone    Doesn't remember reaction   Neomycin-bacitracin Zn-polymyx    Skin irritation   Nifedipine Swelling   Nitrofurantoin    Doesn't remember reaction   Sulfamethoxazole-trimethoprim Hives   Sulfonamide Derivatives Hives   Tizanidine    Liver problems   Vasotec [enalapril Maleate]    Doesn't remember reaction   Ibuprofen Rash   Tape Rash      Medication List    TAKE these medications   atorvastatin 10 MG tablet Commonly known as: Lipitor Take 1 tablet (10 mg total)  by mouth daily.   bisoprolol-hydrochlorothiazide 10-6.25 MG tablet Commonly known as: ZIAC Take 1 tablet by mouth daily.   clobetasol ointment 0.05 % Commonly known as: TEMOVATE Apply 1 application topically 2 (two) times daily as needed for rash.   clopidogrel 75 MG tablet Commonly known as: Plavix Take 1 tablet (75 mg total) by mouth daily.   guaiFENesin 600 MG 12 hr tablet Commonly known as: MUCINEX Take 1  tablet (600 mg total) by mouth 2 (two) times daily as needed for up to 7 days for cough or to loosen phlegm.   hydrALAZINE 50 MG tablet Commonly known as: APRESOLINE Take 50 mg by mouth 3 (three) times daily.   loratadine 10 MG tablet Commonly known as: CLARITIN Take 10 mg by mouth daily as needed for allergies.   losartan 50 MG tablet Commonly known as: COZAAR Take 50 mg by mouth daily.   magic mouthwash w/lidocaine Soln Take 10 mLs by mouth 4 (four) times daily as needed for up to 7 days for mouth pain.   multivitamin capsule Take 2 capsules by mouth daily. GNC MVI   polyethylene glycol 17 g packet Commonly known as: MiraLax Take 34 g by mouth every 2 (two) hours for 1 day. Until you start having a bowel movement.   predniSONE 20 MG tablet Commonly known as: DELTASONE Take 1 tablet (20 mg total) by mouth daily with breakfast for 3 days. To help with the rash. Start taking on: December 31, 2020   rOPINIRole 0.25 MG tablet Commonly known as: REQUIP Take 0.5 mg by mouth at bedtime.   Vitamin D 50 MCG (2000 UT) tablet Take 4,000 Units by mouth daily.        Follow-up Information    Dion Body, MD. Schedule an appointment as soon as possible for a visit in 1 week(s).   Specialty: Family Medicine Contact information: [place] [address] [phone]               Allergies  Allergen Reactions  . Chlorhexidine Hives    SAGE wipes caused severe rash  . Cyclobenzaprine Hcl     Doesn't remember reaction  . Etodolac     Doesn't remember reaction  . Lisinopril     Coughing up blood  . Meloxicam     Doesn't remember reaction  . Metaxalone     Doesn't remember reaction  . Neomycin-Bacitracin Zn-Polymyx     Skin irritation  . Nifedipine Swelling  . Nitrofurantoin     Doesn't remember reaction  . Sulfamethoxazole-Trimethoprim Hives  . Sulfonamide Derivatives Hives  . Tizanidine     Liver problems  .  Vasotec [Enalapril Maleate]     Doesn't remember reaction  . Ibuprofen Rash  . Tape Rash     The results of significant diagnostics from this hospitalization (including imaging, microbiology, ancillary and laboratory) are listed below for reference.   Consultations:   Procedures/Studies: DG Chest 2 View  Result Date: 12/25/2020 CLINICAL DATA:  Weakness EXAM: CHEST - 2 VIEW COMPARISON:  09/08/2013 FINDINGS: Lungs are well expanded, symmetric, and clear. No pneumothorax or pleural effusion. Cardiac size within normal limits. Pulmonary vascularity is normal. Osseous structures are age-appropriate. No acute bone abnormality. IMPRESSION: No active cardiopulmonary disease. Electronically Signed   By: Fidela Salisbury MD   On: 12/25/2020 01:14   ECHOCARDIOGRAM COMPLETE  Result Date: 12/26/2020    ECHOCARDIOGRAM REPORT   Patient Name:   Julie Mcbride Date of Exam:  12/25/2020 Medical Rec #:  [id number]        Height:       65.0 in Accession #:    [id number]       Weight:       200.0 lb Date of Birth:  04/14/1936       BSA:          1.978 m Patient Age:    44 years         BP:           94/50 mmHg Patient Gender: F                HR:           71 bpm. Exam Location:  [facility] Procedure: 2D Echo, Color Doppler, Cardiac Doppler and Intracardiac            Opacification Agent Indications:     Elevated troponin  History:         Patient has no prior history of Echocardiogram examinations.                  Risk Factors:Hypertension.  Sonographer:     Charmayne Sheer RDCS (AE) Referring Phys:  [id number] Arvella Merles MANSY Diagnosing Phys: Yolonda Kida MD  Sonographer Comments: Suboptimal parasternal window. IMPRESSIONS  1. Left ventricular ejection fraction, by estimation, is 45 to 50%. The left ventricle has mildly decreased function. The left ventricle demonstrates global hypokinesis. Left ventricular diastolic parameters were normal.  2. Right ventricular systolic function is low normal. The right ventricular size is  normal.  3. The mitral valve is normal in structure. No evidence of mitral valve regurgitation.  4. The aortic valve is grossly normal. Aortic valve regurgitation is not visualized. FINDINGS  Left Ventricle: Left ventricular ejection fraction, by estimation, is 45 to 50%. The left ventricle has mildly decreased function. The left ventricle demonstrates global hypokinesis. Definity contrast agent was given IV to delineate the left ventricular  endocardial borders. The left ventricular internal cavity size was normal in size. There is no left ventricular hypertrophy. Left ventricular diastolic parameters were normal. Right Ventricle: The right ventricular size is normal. No increase in right ventricular wall thickness. Right ventricular systolic function is low normal. Left Atrium: Left atrial size was normal in size. Right Atrium: Right atrial size was normal in size. Pericardium: There is no evidence of pericardial effusion. Mitral Valve: The mitral valve is normal in structure. No evidence of mitral valve regurgitation. MV peak gradient, 6.2 mmHg. The mean mitral valve gradient is 3.0 mmHg. Tricuspid Valve: The tricuspid valve is normal in structure. Tricuspid valve regurgitation is not demonstrated. Aortic Valve: The aortic valve is grossly normal. Aortic valve regurgitation is not visualized. Aortic valve mean gradient measures 5.0 mmHg. Aortic valve peak gradient measures 9.0 mmHg. Aortic valve area, by VTI measures 2.67 cm. Pulmonic Valve: The pulmonic valve was normal in structure. Pulmonic valve regurgitation is not visualized. Aorta: The ascending aorta was not well visualized. IAS/Shunts: No atrial level shunt detected by color flow Doppler.  LEFT VENTRICLE PLAX 2D LVIDd:         4.50 cm      Diastology LVIDs:         3.00 cm      LV e' medial:    5.55 cm/s LV PW:         1.10 cm      LV E/e' medial:  20.0 LV IVS:  0.90 cm      LV e' lateral:   3.70 cm/s LVOT diam:     2.00 cm      LV E/e' lateral:  30.0 LV SV:         78 LV SV Index:   40 LVOT Area:     3.14 cm  LV Volumes (MOD) LV vol d, MOD A2C: 114.0 ml LV vol d, MOD A4C: 87.9 ml LV vol s, MOD A2C: 55.6 ml LV vol s, MOD A4C: 49.3 ml LV SV MOD A2C:     58.4 ml LV SV MOD A4C:     87.9 ml LV SV MOD BP:      47.9 ml RIGHT VENTRICLE RV Basal diam:  3.70 cm LEFT ATRIUM             Index       RIGHT ATRIUM           Index LA diam:        5.00 cm 2.53 cm/m  RA Area:     17.60 cm LA Vol (A2C):   46.6 ml 23.56 ml/m RA Volume:   46.90 ml  23.71 ml/m LA Vol (A4C):   49.3 ml 24.92 ml/m LA Biplane Vol: 47.7 ml 24.11 ml/m  AORTIC VALVE AV Area (Vmax):    2.45 cm AV Area (Vmean):   2.34 cm AV Area (VTI):     2.67 cm AV Vmax:           150.00 cm/s AV Vmean:          106.500 cm/s AV VTI:            0.293 m AV Peak Grad:      9.0 mmHg AV Mean Grad:      5.0 mmHg LVOT Vmax:         117.00 cm/s LVOT Vmean:        79.400 cm/s LVOT VTI:          0.249 m LVOT/AV VTI ratio: 0.85  AORTA Ao Root diam: 3.10 cm MITRAL VALVE MV Area (PHT): 3.33 cm     SHUNTS MV Area VTI:   1.94 cm     Systemic VTI:  0.25 m MV Peak grad:  6.2 mmHg     Systemic Diam: 2.00 cm MV Mean grad:  3.0 mmHg MV Vmax:       1.25 m/s MV Vmean:      84.5 cm/s MV Decel Time: 228 msec MV E velocity: 111.00 cm/s MV A velocity: 115.00 cm/s MV E/A ratio:  0.97 Dwayne D Callwood MD Electronically signed by Yolonda Kida MD Signature Date/Time: 12/26/2020/2:41:29 PM    Final       Labs: BNP (last 3 results) Recent Labs    12/25/20 0846 12/30/20 0453  BNP 368.3* 161.0*   Basic Metabolic Panel: Recent Labs  Lab 12/25/20 0846 12/25/20 1805 12/26/20 0446 12/27/20 0449 12/28/20 0433 12/29/20 0654  NA 135  --  137 134* 131* 135  K 2.8* 3.6 3.8 3.3* 3.6 4.0  CL 107  --  110 106 101 103  CO2 17*  --  17* 19* 20* 23  GLUCOSE 122*  --  83 93 105* 135*  BUN 35*  --  34* 31* 32* 33*  CREATININE 1.40*  --  1.27* 1.30* 1.50* 1.11*  CALCIUM 7.0*  --  7.5* 7.4* 8.0* 8.4*  MG 1.9  --  2.2 1.9 2.0  2.2  PHOS 2.4*  --   --  2.2* 2.6  --    Liver Function Tests: No results for input(s): AST, ALT, ALKPHOS, BILITOT, PROT, ALBUMIN in the last 168 hours. No results for input(s): LIPASE, AMYLASE in the last 168 hours. No results for input(s): AMMONIA in the last 168 hours. CBC: Recent Labs  Lab 12/25/20 0020 12/26/20 0446 12/27/20 0449 12/28/20 0433 12/29/20 0654  WBC 10.6* 8.0 7.0 8.3 11.9*  NEUTROABS  --   --   --  6.2 9.8*  HGB 13.7 12.2 11.0* 11.5* 11.4*  HCT 41.2 37.5 33.3* 34.9* 33.9*  MCV 90.7 92.4 90.2 90.4 89.2  PLT 151 135* 125* 146* 187   Cardiac Enzymes: Recent Labs  Lab 12/25/20 0646  CKTOTAL 728*   BNP: Invalid input(s): POCBNP CBG: Recent Labs  Lab 12/25/20 2207  GLUCAP 92   D-Dimer No results for input(s): DDIMER in the last 72 hours. Hgb A1c No results for input(s): HGBA1C in the last 72 hours. Lipid Profile No results for input(s): CHOL, HDL, LDLCALC, TRIG, CHOLHDL, LDLDIRECT in the last 72 hours. Thyroid function studies No results for input(s): TSH, T4TOTAL, T3FREE, THYROIDAB in the last 72 hours.  Invalid input(s): FREET3 Anemia work up No results for input(s): VITAMINB12, FOLATE, FERRITIN, TIBC, IRON, RETICCTPCT in the last 72 hours. Urinalysis    Component Value Date/Time   COLORURINE YELLOW (A) 12/25/2020 0615   APPEARANCEUR CLOUDY (A) 12/25/2020 0615   APPEARANCEUR Clear 09/08/2013 0127   LABSPEC 1.014 12/25/2020 0615   LABSPEC 1.021 09/08/2013 0127   PHURINE 5.0 12/25/2020 0615   GLUCOSEU NEGATIVE 12/25/2020 0615   GLUCOSEU Negative 09/08/2013 0127   HGBUR MODERATE (A) 12/25/2020 0615   BILIRUBINUR NEGATIVE 12/25/2020 0615   BILIRUBINUR Negative 09/08/2013 0127   KETONESUR NEGATIVE 12/25/2020 0615   PROTEINUR 30 (A) 12/25/2020 0615   NITRITE POSITIVE (A) 12/25/2020 0615   LEUKOCYTESUR MODERATE (A) 12/25/2020 0615   LEUKOCYTESUR Negative 09/08/2013 0127   Sepsis Labs Invalid input(s): PROCALCITONIN,  WBC,   LACTICIDVEN Microbiology Recent Results (from the past 240 hour(s))  Culture, blood (Routine X 2) w Reflex to ID Panel     Status: None   Collection Time: 12/25/20 12:27 AM   Specimen: Right Antecubital; Blood  Result Value Ref Range Status   Specimen Description RIGHT ANTECUBITAL  Final   Special Requests   Final    BOTTLES DRAWN AEROBIC AND ANAEROBIC Blood Culture adequate volume   Culture   Final    NO GROWTH 5 DAYS Performed at [facility], [address] [place], [place] [address]    Report Status 12/30/2020 FINAL  Final  Culture, blood (Routine X 2) w Reflex to ID Panel     Status: None   Collection Time: 12/25/20  1:46 AM   Specimen: BLOOD  Result Value Ref Range Status   Specimen Description BLOOD LEFT FOREARM  Final   Special Requests   Final    BOTTLES DRAWN AEROBIC AND ANAEROBIC Blood Culture results may not be optimal due to an inadequate volume of blood received in culture bottles   Culture   Final    NO GROWTH 5 DAYS Performed at [facility], [address] [place], [place] [address]    Report Status 12/30/2020 FINAL  Final  SARS Coronavirus 2 by RT PCR (hospital order, performed in [facility] hospital lab) Nasopharyngeal Nasopharyngeal Swab     Status: None   Collection Time: 12/25/20  4:51 AM   Specimen: Nasopharyngeal Swab  Result Value Ref Range Status  SARS Coronavirus 2 NEGATIVE NEGATIVE Final    Comment: (NOTE) SARS-CoV-2 target nucleic acids are NOT DETECTED.  The SARS-CoV-2 RNA is generally detectable in upper and lower respiratory specimens during the acute phase of infection. The lowest concentration of SARS-CoV-2 viral copies this assay can detect is 250 copies / mL. A negative result does not preclude SARS-CoV-2 infection and should not be used as the sole basis for treatment or other patient management decisions.  A negative result may occur with improper specimen collection / handling, submission of specimen other than  nasopharyngeal swab, presence of viral mutation(s) within the areas targeted by this assay, and inadequate number of viral copies (<250 copies / mL). A negative result must be combined with clinical observations, patient history, and epidemiological information.  Fact Sheet for Patients:   StrictlyIdeas.no  Fact Sheet for Healthcare Providers: BankingDealers.co.za  This test is not yet approved or  cleared by the [place] FDA and has been authorized for detection and/or diagnosis of SARS-CoV-2 by FDA under an Emergency Use Authorization (EUA).  This EUA will remain in effect (meaning this test can be used) for the duration of the COVID-19 declaration under Section 564(b)(1) of the Act, 21 U.S.C. section 360bbb-3(b)(1), unless the authorization is terminated or revoked sooner.  Performed at [facility], [address], [place], [place] [address]   Urine Culture     Status: Abnormal   Collection Time: 12/25/20  6:15 AM   Specimen: Urine, Clean Catch  Result Value Ref Range Status   Specimen Description   Final    URINE, CLEAN CATCH Performed at [facility], [address] [place], [place] [address]    Special Requests   Final    NONE Performed at [facility], [address], [place], [place] [address]    Culture (A)  Final    <10,000 COLONIES/mL INSIGNIFICANT GROWTH Performed at [place] [address], [place], [place] [address]    Report Status 12/26/2020 FINAL  Final  Respiratory (~20 pathogens) panel by PCR     Status: None   Collection Time: 12/25/20 12:21 PM   Specimen: Nasopharyngeal Swab; Respiratory  Result Value Ref Range Status   Adenovirus NOT DETECTED NOT DETECTED Final   Coronavirus 229E NOT DETECTED NOT DETECTED Final    Comment: (NOTE) The Coronavirus on the Respiratory Panel, DOES NOT test for the novel  Coronavirus (2019 nCoV)    Coronavirus HKU1 NOT  DETECTED NOT DETECTED Final   Coronavirus NL63 NOT DETECTED NOT DETECTED Final   Coronavirus OC43 NOT DETECTED NOT DETECTED Final   Metapneumovirus NOT DETECTED NOT DETECTED Final   Rhinovirus / Enterovirus NOT DETECTED NOT DETECTED Final   Influenza A NOT DETECTED NOT DETECTED Final   Influenza B NOT DETECTED NOT DETECTED Final   Parainfluenza Virus 1 NOT DETECTED NOT DETECTED Final   Parainfluenza Virus 2 NOT DETECTED NOT DETECTED Final   Parainfluenza Virus 3 NOT DETECTED NOT DETECTED Final   Parainfluenza Virus 4 NOT DETECTED NOT DETECTED Final   Respiratory Syncytial Virus NOT DETECTED NOT DETECTED Final   Bordetella pertussis NOT DETECTED NOT DETECTED Final   Bordetella Parapertussis NOT DETECTED NOT DETECTED Final   Chlamydophila pneumoniae NOT DETECTED NOT DETECTED Final   Mycoplasma pneumoniae NOT DETECTED NOT DETECTED Final    Comment: Performed at [facility], [place] [address], [place], [place] [address]     Total time spend on discharging this patient, including the last patient exam,  discussing the hospital stay, instructions for ongoing care as it relates to all pertinent caregivers, as well as preparing the medical discharge records, prescriptions, and/or referrals as applicable, is 45 minutes.    Enzo Bi, MD  [facility] 12/30/2020, 1:38 PM

## 2020-12-30 NOTE — TOC Progression Note (Signed)
Transition of Care [facility]) - Progression Note    Patient Details  Name: Julie Mcbride MRN: [id number] Date of Birth: 13-Dec-1935  Transition [facility] Care [facility]) CM/SW [place], LCSW Phone Number: 12/30/2020, 11:03 AM  Clinical Narrative:   Met with patient and spouse at bedside. Patient and spouse confirm they still do not want Home Health. Patient's husband reported he provides care at home for patient. They reported patient has a RW, 3 in 1, and cane at home. They refuse wheelchair recommendation. Husband reported he will provide transport when patient is discharged.         Expected Discharge Plan and Services                                                 Social Determinants of Health (SDOH) Interventions    Readmission Risk Interventions No flowsheet data found.

## 2020-12-30 NOTE — TOC Transition Note (Signed)
Transition of Care [facility]) - CM/SW Discharge Note   Patient Details  Name: Julie Mcbride MRN: [id number] Date of Birth: 04/06/1936  Transition of Care [facility]) CM/SW Contact:  Magnus Ivan, LCSW Phone Number: 12/30/2020, 1:52 PM   Clinical Narrative:   Patient discharging home today. Refused HH and DME recommendations. TOC is signing off.    Final next level of care: Home/Self Care Barriers to Discharge: Barriers Resolved   Patient Goals and CMS Choice Patient states their goals for this hospitalization and ongoing recovery are:: home with self care CMS Medicare.gov Compare Post Acute Care list provided to:: Patient Choice offered to / list presented to : [name]  Discharge Placement                    Patient and family notified of of transfer: 12/30/20  Discharge Plan and Services                                     Social Determinants of Health (SDOH) Interventions     Readmission Risk Interventions No flowsheet data found.

## 2020-12-30 NOTE — Progress Notes (Signed)
Patient discharged to home wheeled out of unit by transport accompanied by husband with all belongings.  Discharge and medications reviewed with patient and husband.  All questions answered.  PIV x 2 removed, no bleeding, intact.  Patient verbalized understanding signs and symptoms of infection.  Patient agreed to follow up with all appointments as listed on AVS.  Patient satisfied with overall care at [facility].

## 2020-12-30 NOTE — Progress Notes (Signed)
Occupational Therapy Treatment Patient Details Name: Julie Mcbride MRN: [id number] DOB: [birth year] Today's Date: 12/30/2020    History of present illness Pt is an 85 y/o F admitted on 12/25/20 after presenting to ED with acute onset of generalized weakness for the last week & 1 day of fever with chills & mild cough. Pt reports positive antigen COVID test at home but both antigen & PCR tests were negative at hospital. Pt admitted for tx of sespsis of unknown source. 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 Pt reports she believes she will be able to discharge today. Upon speaking with RN, OT confirms this and RN okays removing pt's telemetry. OT engages pt in UB bathing and dressing with SETUP in sitting EOB and increased time. Next. OT engages pt in sit to stand with RW with SBA. Pt completes fxl mobility to restroom and requires SBA for commode transfer. Pt requires SBA for standing balance with grab bars and RW for peri care following BM. Pt requires seated rest break and then MIN A to engage in threading brief, underwear and pants, at which time, OT educates re: use of AE for LB ADLs. Pt with good understanding. Pt completes clothing mgt over hips with RW in static standing with SBA. Pt completes fxl mobility back to bed with RW with SBA and is left seated EOB with spouse present at bedside. RN notified of pt's BM. Pt tolerates well. Continue to recommend Cool f/u.    Follow Up Recommendations  Home health OT;Supervision/Assistance - 24 hour    Equipment Recommendations  3 in 1 bedside commode    Recommendations for Other Services      Precautions / Restrictions Precautions Precautions: Fall Restrictions Weight Bearing Restrictions: No       Mobility Bed Mobility Overal bed mobility: Modified Independent Bed Mobility: Supine to Sit;Sit  to Supine     Supine to sit: Supervision;HOB elevated Sit to supine: Supervision;HOB elevated   General bed mobility comments: extra time  Transfers Overall transfer level: Needs assistance Equipment used: Rolling walker (2 wheeled) Transfers: Sit to/from [facility] Sit to Stand: Supervision Stand pivot transfers: Supervision       General transfer comment: increased time    Balance Overall balance assessment: Needs assistance Sitting-balance support: No upper extremity supported;Feet supported Sitting balance-Leahy Scale: Fair     Standing balance support: No upper extremity supported;During functional activity Standing balance-Leahy Scale: Fair Standing balance comment: BUE reliance on RW, light sway occasionally, no gross LOB.                           ADL either performed or assessed with clinical judgement   ADL Overall ADL's : Needs assistance/impaired     Grooming: Wash/dry hands;Set up;Sitting;Oral care;Brushing hair;Supervision/safety;Standing Grooming Details (indicate cue type and reason): sitting to wash hands, standing with RW and SBA Upper Body Bathing: Supervision/ safety;Set up;Sitting   Lower Body Bathing: Sitting/lateral leans;Min guard   Upper Body Dressing : Set up;Minimal assistance;Sitting Upper Body Dressing Details (indicate cue type and reason): MIN A d/t some UE swelling superimposed on decreased ROM in shoulders at baseline. Lower Body Dressing: Minimal assistance;Sit to/from stand Lower Body Dressing Details (indicate cue type and reason): MIN A to thread LEs through underwear, brief, pants and socks. Pt able to perfortm  sit to stand with RW with SBA to manage clothing over hips. Toilet Transfer: Supervision/safety;Ambulation;RW;Grab bars;BSC Toilet Transfer Details (indicate cue type and reason): BSC over standard commode in restroom to elevate height, RW to from restroom. Toileting- Clothing Manipulation and  Hygiene: Supervision/safety;Sit to/from stand Toileting - Clothing Manipulation Details (indicate cue type and reason): RW, grab bars     Functional mobility during ADLs: Supervision/safety;Rolling walker (to/from restroom)       Vision Patient Visual Report: No change from baseline     Perception     Praxis      Cognition Arousal/Alertness: Awake/alert Behavior During Therapy: WFL for tasks assessed/performed Overall Cognitive Status: Within Functional Limits for tasks assessed                                 General Comments: AxOx4        Exercises Other Exercises Other Exercises: OT engages pt in UB/LB bathing and dressing. Pt tolerates well, does require some cues for pacing as well as ed re: AE for LB ADLs including use of reacher/grabber to thread underwear/pants.   Shoulder Instructions       General Comments      Pertinent Vitals/ Pain       Pain Assessment: No/denies pain Faces Pain Scale: Hurts a little bit Pain Location: sore/stiff joints. R knee and L shoulder in particular Pain Descriptors / Indicators: Discomfort;Sore Pain Intervention(s): Monitored during session  Home Living                                          Prior Functioning/Environment              Frequency  Min 2X/week        Progress Toward Goals  OT Goals(current goals can now be found in the care plan section)  Progress towards OT goals: Progressing toward goals  Acute Rehab OT Goals Patient Stated Goal: to go home OT Goal Formulation: With patient/family Time For Goal Achievement: 01/10/21 Potential to Achieve Goals: [place] Discharge plan remains appropriate;Frequency remains appropriate    Co-evaluation                 AM-PAC OT "6 Clicks" Daily Activity     Outcome Measure   Help from another person eating meals?: None Help from another person taking care of personal grooming?: A Little Help from another person  toileting, which includes using toliet, bedpan, or urinal?: A Little Help from another person bathing (including washing, rinsing, drying)?: A Lot Help from another person to put on and taking off regular upper body clothing?: A Little Help from another person to put on and taking off regular lower body clothing?: A Little 6 Click Score: 18    End of Session Equipment Utilized During Treatment: Rolling walker  OT Visit Diagnosis: Unsteadiness on feet (R26.81);Muscle weakness (generalized) (M62.81)   Activity Tolerance Patient tolerated treatment well   Patient Left Other (comment) (seated EOB eating from lunch tray with husband present in room throughout)   Nurse Communication Mobility status;Other (comment) (asked RN if tele could be removed as pt is to d/c. In addition, notified RN of pt's BM)        Time: 6283-6629 OT Time Calculation (min): 53 min  Charges: OT General Charges $OT Visit: 1 Visit OT Treatments $  Self Care/Home Management : 23-37 mins $Therapeutic Activity: 8-22 mins  Gerrianne Scale, MS, OTR/L ascom [phone] 12/30/20, 3:02 PM

## 2021-01-01 DIAGNOSIS — I10 Essential (primary) hypertension: Secondary | ICD-10-CM | POA: Diagnosis not present

## 2021-01-01 DIAGNOSIS — E6609 Other obesity due to excess calories: Secondary | ICD-10-CM | POA: Diagnosis not present

## 2021-01-01 DIAGNOSIS — I252 Old myocardial infarction: Secondary | ICD-10-CM | POA: Insufficient documentation

## 2021-01-01 DIAGNOSIS — M19041 Primary osteoarthritis, right hand: Secondary | ICD-10-CM | POA: Insufficient documentation

## 2021-01-01 DIAGNOSIS — B960 Mycoplasma pneumoniae [M. pneumoniae] as the cause of diseases classified elsewhere: Secondary | ICD-10-CM | POA: Diagnosis not present

## 2021-01-01 DIAGNOSIS — M19042 Primary osteoarthritis, left hand: Secondary | ICD-10-CM | POA: Insufficient documentation

## 2021-01-01 DIAGNOSIS — I255 Ischemic cardiomyopathy: Secondary | ICD-10-CM | POA: Insufficient documentation

## 2021-01-01 DIAGNOSIS — Z8679 Personal history of other diseases of the circulatory system: Secondary | ICD-10-CM | POA: Diagnosis not present

## 2021-01-06 DIAGNOSIS — I252 Old myocardial infarction: Secondary | ICD-10-CM | POA: Diagnosis not present

## 2021-01-06 DIAGNOSIS — I255 Ischemic cardiomyopathy: Secondary | ICD-10-CM | POA: Diagnosis not present

## 2021-01-06 DIAGNOSIS — Z8679 Personal history of other diseases of the circulatory system: Secondary | ICD-10-CM | POA: Diagnosis not present

## 2021-01-06 DIAGNOSIS — E6609 Other obesity due to excess calories: Secondary | ICD-10-CM | POA: Diagnosis not present

## 2021-01-06 DIAGNOSIS — I10 Essential (primary) hypertension: Secondary | ICD-10-CM | POA: Diagnosis not present

## 2021-01-06 DIAGNOSIS — I739 Peripheral vascular disease, unspecified: Secondary | ICD-10-CM | POA: Diagnosis not present

## 2021-01-06 DIAGNOSIS — N183 Chronic kidney disease, stage 3 unspecified: Secondary | ICD-10-CM | POA: Diagnosis not present

## 2021-01-08 DIAGNOSIS — R350 Frequency of micturition: Secondary | ICD-10-CM | POA: Diagnosis not present

## 2021-01-08 DIAGNOSIS — N3281 Overactive bladder: Secondary | ICD-10-CM | POA: Diagnosis not present

## 2021-02-05 DIAGNOSIS — N390 Urinary tract infection, site not specified: Secondary | ICD-10-CM | POA: Diagnosis not present

## 2021-02-05 DIAGNOSIS — R350 Frequency of micturition: Secondary | ICD-10-CM | POA: Diagnosis not present

## 2021-02-05 DIAGNOSIS — N3281 Overactive bladder: Secondary | ICD-10-CM | POA: Diagnosis not present

## 2021-02-15 ENCOUNTER — Other Ambulatory Visit (INDEPENDENT_AMBULATORY_CARE_PROVIDER_SITE_OTHER): Payer: Self-pay | Admitting: Vascular Surgery

## 2021-02-15 DIAGNOSIS — I701 Atherosclerosis of renal artery: Secondary | ICD-10-CM

## 2021-02-16 ENCOUNTER — Other Ambulatory Visit: Payer: Self-pay

## 2021-02-16 ENCOUNTER — Ambulatory Visit (INDEPENDENT_AMBULATORY_CARE_PROVIDER_SITE_OTHER): Payer: PPO | Admitting: Vascular Surgery

## 2021-02-16 ENCOUNTER — Encounter (INDEPENDENT_AMBULATORY_CARE_PROVIDER_SITE_OTHER): Payer: Self-pay

## 2021-02-16 ENCOUNTER — Ambulatory Visit (INDEPENDENT_AMBULATORY_CARE_PROVIDER_SITE_OTHER): Payer: PPO

## 2021-02-16 ENCOUNTER — Encounter (INDEPENDENT_AMBULATORY_CARE_PROVIDER_SITE_OTHER): Payer: Self-pay | Admitting: Vascular Surgery

## 2021-02-16 VITALS — BP 149/70 | HR 66 | Resp 16 | Wt 214.8 lb

## 2021-02-16 DIAGNOSIS — Z87440 Personal history of urinary (tract) infections: Secondary | ICD-10-CM

## 2021-02-16 DIAGNOSIS — I701 Atherosclerosis of renal artery: Secondary | ICD-10-CM

## 2021-02-16 DIAGNOSIS — N183 Chronic kidney disease, stage 3 unspecified: Secondary | ICD-10-CM

## 2021-02-16 DIAGNOSIS — I150 Renovascular hypertension: Secondary | ICD-10-CM | POA: Diagnosis not present

## 2021-02-16 NOTE — Assessment & Plan Note (Signed)
Blood pressure control is much better after renal artery stent placement a year ago.

## 2021-02-16 NOTE — Assessment & Plan Note (Signed)
The patient has had 3 recurring UTIs over the past several months.  She previously saw urologist who has retired.  I think would be reasonable to see the urologist again.  We can provide a referral.

## 2021-02-16 NOTE — Assessment & Plan Note (Signed)
Her duplex today shows no significant right renal artery stenosis and a widely patent stent in the left renal artery without significant recurrent stenosis. This is highly unlikely to be a cause of a recurring UTI as this is not in the urinary stream but rather in the bloodstream.  The daughter was concerned about this.  Her renal artery duplex looks good today.  We can go to an annual follow-up at this point.

## 2021-02-16 NOTE — Progress Notes (Signed)
MRN : [id number]  Julie Mcbride is a 85 y.o. ([birth year]) female who presents with chief complaint of  Chief Complaint  Patient presents with  . Follow-up    6 month renal ultrasound follow up  .  History of Present Illness: Patient returns today in follow up of her renal artery stenosis.  She is almost a year status post left renal stent placement for high-grade stenosis with poorly controlled blood pressure.  She also has chronic kidney disease stage III.  Her blood pressure has been much better.  It is in the normal range today.  Her biggest issue over the past 2 to 3 months have been recurring UTIs.  She has been on 3 different treatments for UTIs and still has had difficulty clearing these.  She was hospitalized for a week with a severe UTI January.  Her duplex today shows no significant right renal artery stenosis and a widely patent stent in the left renal artery without significant recurrent stenosis.  Current Outpatient Medications  Medication Sig Dispense Refill  . atorvastatin (LIPITOR) 10 MG tablet Take 1 tablet (10 mg total) by mouth daily. 30 tablet 11  . bisoprolol-hydrochlorothiazide (ZIAC) 10-6.25 MG per tablet Take 1 tablet by mouth daily.    . Calcium Carbonate Antacid (CALCIUM CARBONATE PO) Take 300 mg by mouth.    . Cholecalciferol (VITAMIN D) 50 MCG (2000 UT) tablet Take 4,000 Units by mouth daily.     . clobetasol ointment (TEMOVATE) 9.38 % Apply 1 application topically 2 (two) times daily as needed for rash.    . clopidogrel (PLAVIX) 75 MG tablet Take 1 tablet (75 mg total) by mouth daily. 30 tablet 11  . fluticasone (FLONASE) 50 MCG/ACT nasal spray Place 2 sprays into both nostrils as needed for allergies or rhinitis.    . hydrALAZINE (APRESOLINE) 50 MG tablet Take 50 mg by mouth 3 (three) times daily.     Marland Kitchen loratadine (CLARITIN) 10 MG tablet Take 10 mg by mouth daily as needed for allergies.     Marland Kitchen losartan (COZAAR) 50 MG tablet Take 50 mg by mouth daily.    .  magic mouthwash w/lidocaine SOLN Take 5 mLs by mouth every 6 (six) hours as needed for mouth pain.    . Multiple Vitamin (MULTIVITAMIN) capsule Take 2 capsules by mouth daily. [employer] MVI    . rOPINIRole (REQUIP) 0.25 MG tablet Take 0.5 mg by mouth at bedtime.     No current facility-administered medications for this visit.    Past Medical History:  Diagnosis Date  . Arthritis    Knees, hands  . Basal cell carcinoma    back - removed  . Bladder polyp    With previos hematuria  . Diverticulitis   . Dyspnea on exertion    Chronic  . GERD (gastroesophageal reflux disease)   . History of ETT    Myoview 7/10 EF 78%, no ischemia or infarction. Poor exercise tolerance (2'30", stopped due to fatigue). Reached 86% MPHR.  Marland Kitchen Hypertension   . Kidney stones   . Multiple allergies    Chronic  . Palpitations    Holter 7/10 with rare PACs  . Postmenopausal   . S/P hysterectomy   . S/P laminectomy   . 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 ABDOMINAL HYSTERECTOMY    . ANKLE FRACTURE SURGERY Right 9/14   Dr. Ulice Dash  .  BACK SURGERY    . BASAL CELL CARCINOMA EXCISION     back  . BREAST BIOPSY Right 06/19/2017   Affirm Bx- Radial Scar  . BREAST EXCISIONAL BIOPSY Right 1996   neg  . BREAST LUMPECTOMY WITH NEEDLE LOCALIZATION Right 08/11/2017   Procedure: BREAST LUMPECTOMY WITH NEEDLE LOCALIZATION;  Surgeon: Leonie Green, MD;  Location: [facility];  Service: General;  Laterality: Right;  . CARPAL TUNNEL RELEASE Right   . CATARACT EXTRACTION W/ INTRAOCULAR LENS  IMPLANT, BILATERAL    . EXCISION OF BREAST BIOPSY Right 08/11/2017   excision of radial Scar  . EYE SURGERY    . JOINT REPLACEMENT Right    knee  . KNEE ARTHROSCOPY Right 04/22/2016   Procedure: ARTHROSCOPY KNEE;  Surgeon: Leanor Kail, MD;  Location: [facility];  Service: Orthopedics;  Laterality: Right;  . knee  replacement Right 01/11/2017  . NASAL SINUS SURGERY  06/05/14   Dr. Pryor Ochoa  . POSTERIOR CERVICAL LAMINECTOMY  2005   C5-7; plate and screws  . RENAL ANGIOGRAPHY Left 04/20/2020   Procedure: RENAL ANGIOGRAPHY;  Surgeon: Algernon Huxley, MD;  Location: [facility];  Service: Cardiovascular;  Laterality: Left;  . TONSILLECTOMY       Social History   Tobacco Use  . Smoking status: Never Smoker  . Smokeless tobacco: Never Used  Vaping Use  . Vaping Use: Never used  Substance Use Topics  . Alcohol use: No  . Drug use: No     Family History  Problem Relation Age of Onset  . Ovarian cancer Mother   . Stroke Father        CVA  . Breast cancer Daughter 72  . Breast cancer Maternal Aunt 60  . Breast cancer Maternal Aunt 90    Allergies  Allergen Reactions  . Chlorhexidine Hives    SAGE wipes caused severe rash  . Cyclobenzaprine Hcl     Doesn't remember reaction  . Etodolac     Doesn't remember reaction  . Lisinopril     Coughing up blood  . Meloxicam     Doesn't remember reaction  . Metaxalone     Doesn't remember reaction  . Neomycin-Bacitracin Zn-Polymyx     Skin irritation  . Nifedipine Swelling  . Nitrofurantoin     Doesn't remember reaction  . Sulfamethoxazole-Trimethoprim Hives  . Sulfonamide Derivatives Hives  . Tizanidine     Liver problems  . Vasotec [Enalapril Maleate]     Doesn't remember reaction  . Ibuprofen Rash  . Tape Rash     REVIEW OF SYSTEMS (Negative unless checked)  Constitutional: _0 ?Weight loss  _1 ?Fever  _2 ?Chills Cardiac: _3 ?Chest pain   _4 ?Chest pressure   _5 ?Palpitations   _6 ?Shortness of breath when laying flat   _7 ?Shortness of breath at rest   _8 ?Shortness of breath with exertion. Vascular:  _9 ?Pain in legs with walking   _10 ?Pain in legs at rest   _11 ?Pain in legs when laying flat   _12 ?Claudication   _13 ?Pain in feet when walking  _14 ?Pain in feet at rest  _15 ?Pain in feet when laying flat   _16 ?History of DVT   _17 ?Phlebitis    _18 ?Swelling in legs   _19 ?Varicose veins   _20 ?Non-healing ulcers Pulmonary:   _21 ?Uses home oxygen   _22 ?Productive cough   _23 ?Hemoptysis   _24 ?Wheeze  _25 ?COPD   _26 ?Asthma Neurologic:  _27 ?Dizziness  _28 ?Blackouts   _29 ?Seizures   _30 ?History of stroke   _31 ?History of TIA  _32 ?Aphasia   _33 ?Temporary blindness   _34 ?  Dysphagia   _0 ?Weakness or numbness in arms   _1 ?Weakness or numbness in legs Musculoskeletal:  _2 ?Arthritis   _3 ?Joint swelling   _4 ?Joint pain   _5 ?Low back pain Hematologic:  _6 ?Easy bruising  _7 ?Easy bleeding   _8 ?Hypercoagulable state   _9 ?Anemic  _10 ?Hepatitis Gastrointestinal:  _11 ?Blood in stool   _12 ?Vomiting blood  _13 ?Gastroesophageal reflux/heartburn   _14 ?Abdominal pain Genitourinary:  _15 ?Chronic kidney disease   _16 ?Difficult urination  _17 ?Frequent urination  _18 ?Burning with urination   _19 ?Hematuria Skin:  _20 ?Rashes   _21 ?Ulcers   _22 ?Wounds Psychological:  _23 ?History of anxiety   _24 ? History of major depression.  Physical Examination  BP (!) 149/70 (BP Location: Left Arm)   Pulse 66   Resp 16   Wt 214 lb 12.8 oz (97.4 kg)   BMI 35.74 kg/m  Gen:  WD/WN, NAD. Appears younger than stated age. Head: [place]/AT, No temporalis wasting. Ear/Nose/Throat: Hearing grossly intact, nares w/o erythema or drainage Eyes: Conjunctiva clear. Sclera non-icteric Neck: Supple.  Trachea midline Pulmonary:  Good air movement, no use of accessory muscles.  Cardiac: RRR, no JVD Vascular:  Vessel Right Left  Radial Palpable Palpable           Musculoskeletal: M/S 5/5 throughout.  No deformity or atrophy. No edema. Neurologic: Sensation grossly intact in extremities.  Symmetrical.  Speech is fluent.  Psychiatric: Judgment intact, Mood & affect appropriate for pt's clinical situation. Dermatologic: No rashes or ulcers noted.  No cellulitis or open wounds.       Labs Recent Results (from the past 2160 hour(s))  Basic metabolic panel     Status: Abnormal   Collection Time: 12/25/20 12:20 AM   Result Value Ref Range   Sodium 134 (L) 135 - 145 mmol/L   Potassium 2.9 (L) 3.5 - 5.1 mmol/L   Chloride 98 98 - 111 mmol/L   CO2 22 22 - 32 mmol/L   Glucose, Bld 117 (H) 70 - 99 mg/dL    Comment: Glucose reference range applies only to samples taken after fasting for at least 8 hours.   BUN 34 (H) 8 - 23 mg/dL   Creatinine, Ser 1.64 (H) 0.44 - 1.00 mg/dL   Calcium 8.2 (L) 8.9 - 10.3 mg/dL   GFR, Estimated 31 (L) >60 mL/min    Comment: (NOTE) Calculated using the CKD-EPI Creatinine Equation (2021)    Anion gap 14 5 - 15    Comment: Performed at [facility], [address] [place], [place] [address]  CBC     Status: Abnormal   Collection Time: 12/25/20 12:20 AM  Result Value Ref Range   WBC 10.6 (H) 4.0 - 10.5 K/uL   RBC 4.54 3.87 - 5.11 MIL/uL   Hemoglobin 13.7 12.0 - 15.0 g/dL   HCT 41.2 36.0 - 46.0 %   MCV 90.7 80.0 - 100.0 fL   MCH 30.2 26.0 - 34.0 pg   MCHC 33.3 30.0 - 36.0 g/dL   RDW 13.6 11.5 - 15.5 %   Platelets 151 150 - 400 K/uL   nRBC 0.0 0.0 - 0.2 %    Comment: Performed at [facility], [address] [place], [place] [address]  Troponin I (High Sensitivity)     Status: Abnormal   Collection Time: 12/25/20 12:20 AM  Result Value Ref Range   Troponin I (High Sensitivity) 634 (HH) <18 ng/L    Comment: CRITICAL RESULT CALLED TO, READ BACK BY AND VERIFIED WITH LISA THOMPSON _25  ON 12/25/20 SKL (NOTE) Elevated high sensitivity troponin I (  hsTnI) values and significant  changes across serial measurements may suggest ACS but many other  chronic and acute conditions are known to elevate hsTnI results.  Refer to the "Links" section for chest pain algorithms and additional  guidance. Performed at [facility], [address] [place], [place] [address]   Lactic acid, plasma     Status: Abnormal   Collection Time: 12/25/20 12:27 AM  Result Value Ref Range   Lactic Acid, Venous 2.0 (HH) 0.5 - 1.9 mmol/L    Comment: CRITICAL RESULT  CALLED TO, READ BACK BY AND VERIFIED WITH LISA THOMPSON _0  ON 12/25/20 SKL Performed at [facility], [address] [place], [place] [address]   Culture, blood (Routine X 2) w Reflex to ID Panel     Status: None   Collection Time: 12/25/20 12:27 AM   Specimen: Right Antecubital; Blood  Result Value Ref Range   Specimen Description RIGHT ANTECUBITAL    Special Requests      BOTTLES DRAWN AEROBIC AND ANAEROBIC Blood Culture adequate volume   Culture      NO GROWTH 5 DAYS Performed at [facility], [address] [place], [place] [address]    Report Status 12/30/2020 FINAL   Culture, blood (Routine X 2) w Reflex to ID Panel     Status: None   Collection Time: 12/25/20  1:46 AM   Specimen: BLOOD  Result Value Ref Range   Specimen Description BLOOD LEFT FOREARM    Special Requests      BOTTLES DRAWN AEROBIC AND ANAEROBIC Blood Culture results may not be optimal due to an inadequate volume of blood received in culture bottles   Culture      NO GROWTH 5 DAYS Performed at [facility], [address], [place], [place] [address]    Report Status 12/30/2020 FINAL   Lactic acid, plasma     Status: None   Collection Time: 12/25/20  1:47 AM  Result Value Ref Range   Lactic Acid, Venous 1.7 0.5 - 1.9 mmol/L    Comment: Performed at [facility], [address] [place], [place] [address]  APTT     Status: None   Collection Time: 12/25/20  1:47 AM  Result Value Ref Range   aPTT 25 24 - 36 seconds    Comment: Performed at [facility], [address] [place], [place] [address]  Protime-INR     Status: None   Collection Time: 12/25/20  1:47 AM  Result Value Ref Range   Prothrombin Time 14.1 11.4 - 15.2 seconds   INR 1.1 0.8 - 1.2    Comment: (NOTE) INR goal varies based on device and disease states. Performed at [facility], [address] [place], [place] [address]   Troponin I (High Sensitivity)     Status: Abnormal    Collection Time: 12/25/20  1:47 AM  Result Value Ref Range   Troponin I (High Sensitivity) 583 (HH) <18 ng/L    Comment: CRITICAL VALUE NOTED. VALUE IS CONSISTENT WITH PREVIOUSLY REPORTED/CALLED VALUE SKL (NOTE) Elevated high sensitivity troponin I (hsTnI) values and significant  changes across serial measurements may suggest ACS but many other  chronic and acute conditions are known to elevate hsTnI results.  Refer to the "Links" section for chest pain algorithms and additional  guidance. Performed at [facility], [address] [place],  [address]   POC SARS Coronavirus 2 Ag-ED - Nasal Swab (BD Veritor Kit)  Status: None   Collection Time: 12/25/20  3:01 AM  Result Value Ref Range   SARS Coronavirus 2 Ag Negative Negative  Procalcitonin     Status: None   Collection Time: 12/25/20  3:19 AM  Result Value Ref Range   Procalcitonin 2.59 ng/mL    Comment:        Interpretation: PCT > 2 ng/mL: Systemic infection (sepsis) is likely, unless other causes are known. (NOTE)       Sepsis PCT Algorithm           Lower Respiratory Tract                                      Infection PCT Algorithm    ----------------------------     ----------------------------         PCT < 0.25 ng/mL                PCT < 0.10 ng/mL          Strongly encourage             Strongly discourage   discontinuation of antibiotics    initiation of antibiotics    ----------------------------     -----------------------------       PCT 0.25 - 0.50 ng/mL            PCT 0.10 - 0.25 ng/mL               OR       >80% decrease in PCT            Discourage initiation of                                            antibiotics      Encourage discontinuation           of antibiotics    ----------------------------     -----------------------------         PCT >= 0.50 ng/mL              PCT 0.26 - 0.50 ng/mL               AND       <80% decrease in PCT              Encourage initiation of                                              antibiotics       Encourage continuation           of antibiotics    ----------------------------     -----------------------------        PCT >= 0.50 ng/mL                  PCT > 0.50 ng/mL               AND         increase in PCT                  Strongly encourage  initiation of antibiotics    Strongly encourage escalation           of antibiotics                                     -----------------------------                                           PCT <= 0.25 ng/mL                                                 OR                                        > 80% decrease in PCT                                      Discontinue / Do not initiate                                             antibiotics  Performed at [facility], [address] [place], [place] [address]   SARS Coronavirus 2 by RT PCR (hospital order, performed in [facility] hospital lab) Nasopharyngeal Nasopharyngeal Swab     Status: None   Collection Time: 12/25/20  4:51 AM   Specimen: Nasopharyngeal Swab  Result Value Ref Range   SARS Coronavirus 2 NEGATIVE NEGATIVE    Comment: (NOTE) SARS-CoV-2 target nucleic acids are NOT DETECTED.  The SARS-CoV-2 RNA is generally detectable in upper and lower respiratory specimens during the acute phase of infection. The lowest concentration of SARS-CoV-2 viral copies this assay can detect is 250 copies / mL. A negative result does not preclude SARS-CoV-2 infection and should not be used as the sole basis for treatment or other patient management decisions.  A negative result may occur with improper specimen collection / handling, submission of specimen other than nasopharyngeal swab, presence of viral mutation(s) within the areas targeted by this assay, and inadequate number of viral copies (<250 copies / mL). A negative result must be combined with clinical observations, patient  history, and epidemiological information.  Fact Sheet for Patients:   StrictlyIdeas.no  Fact Sheet for Healthcare Providers: BankingDealers.co.za  This test is not yet approved or  cleared by the [place] FDA and has been authorized for detection and/or diagnosis of SARS-CoV-2 by FDA under an Emergency Use Authorization (EUA).  This EUA will remain in effect (meaning this test can be used) for the duration of the COVID-19 declaration under Section 564(b)(1) of the Act, 21 U.S.C. section 360bbb-3(b)(1), unless the authorization is terminated or revoked sooner.  Performed at [facility], [address] [place], [place] [address]   Urinalysis, Routine w reflex microscopic Urine, Clean Catch     Status: Abnormal   Collection Time: 12/25/20  6:15 AM  Result Value Ref  Range   Color, Urine YELLOW (A) YELLOW   APPearance CLOUDY (A) CLEAR   Specific Gravity, Urine 1.014 1.005 - 1.030   pH 5.0 5.0 - 8.0   Glucose, UA NEGATIVE NEGATIVE mg/dL   Hgb urine dipstick MODERATE (A) NEGATIVE   Bilirubin Urine NEGATIVE NEGATIVE   Ketones, ur NEGATIVE NEGATIVE mg/dL   Protein, ur 30 (A) NEGATIVE mg/dL   Nitrite POSITIVE (A) NEGATIVE   Leukocytes,Ua MODERATE (A) NEGATIVE   RBC / HPF 0-5 0 - 5 RBC/hpf   WBC, UA >50 (H) 0 - 5 WBC/hpf   Bacteria, UA MANY (A) NONE SEEN   Squamous Epithelial / LPF 0-5 0 - 5   Mucus PRESENT    Hyaline Casts, UA PRESENT     Comment: Performed at [facility], [address] [place], [place] [address]  Urine Culture     Status: Abnormal   Collection Time: 12/25/20  6:15 AM   Specimen: Urine, Clean Catch  Result Value Ref Range   Specimen Description      URINE, CLEAN CATCH Performed at [facility], [address] [place], [place] [address]    Special Requests      NONE Performed at [facility], [address], [place], [place] [address]    Culture (A)     <10,000  COLONIES/mL INSIGNIFICANT GROWTH Performed at [facility] [address], [place], [place] [address]    Report Status 12/26/2020 FINAL   CK     Status: Abnormal   Collection Time: 12/25/20  6:46 AM  Result Value Ref Range   Total CK 728 (H) 38 - 234 U/L    Comment: Performed at [facility], [address] [place], [place] [address]  Basic metabolic panel     Status: Abnormal   Collection Time: 12/25/20  8:46 AM  Result Value Ref Range   Sodium 135 135 - 145 mmol/L   Potassium 2.8 (L) 3.5 - 5.1 mmol/L   Chloride 107 98 - 111 mmol/L   CO2 17 (L) 22 - 32 mmol/L   Glucose, Bld 122 (H) 70 - 99 mg/dL    Comment: Glucose reference range applies only to samples taken after fasting for at least 8 hours.   BUN 35 (H) 8 - 23 mg/dL   Creatinine, Ser 1.40 (H) 0.44 - 1.00 mg/dL   Calcium 7.0 (L) 8.9 - 10.3 mg/dL   GFR, Estimated 38 (L) >60 mL/min    Comment: (NOTE) Calculated using the CKD-EPI Creatinine Equation (2021)    Anion gap 11 5 - 15    Comment: Performed at [facility], [address] [place], [place] [address]  Magnesium     Status: None   Collection Time: 12/25/20  8:46 AM  Result Value Ref Range   Magnesium 1.9 1.7 - 2.4 mg/dL    Comment: Performed at [facility], [address], [place], [place] [address]  Phosphorus     Status: Abnormal   Collection Time: 12/25/20  8:46 AM  Result Value Ref Range   Phosphorus 2.4 (L) 2.5 - 4.6 mg/dL    Comment: Performed at [facility], [address] [place],  [address]  Brain natriuretic peptide     Status: Abnormal   Collection Time: 12/25/20  8:46 AM  Result Value Ref Range   B Natriuretic Peptide 368.3 (H) 0.0 - 100.0 pg/mL    Comment: Performed at [facility], [place], [place]  [address]  Heparin level (unfractionated)     Status: Abnormal   Collection Time: 12/25/20 10:32 AM  Result Value Ref Range   Heparin Unfractionated 0.17 (L) 0.30 - 0.70  IU/mL    Comment: (NOTE) If heparin results are below expected values, and patient dosage has  been confirmed, suggest follow up testing of antithrombin III levels. Performed at [facility], [address] [place], [place] [address]   Respiratory (~20 pathogens) panel by PCR     Status: None   Collection Time: 12/25/20 12:21 PM   Specimen: Nasopharyngeal Swab; Respiratory  Result Value Ref Range   Adenovirus NOT DETECTED NOT DETECTED   Coronavirus 229E NOT DETECTED NOT DETECTED    Comment: (NOTE) The Coronavirus on the Respiratory Panel, DOES NOT test for the novel  Coronavirus (2019 nCoV)    Coronavirus HKU1 NOT DETECTED NOT DETECTED   Coronavirus NL63 NOT DETECTED NOT DETECTED   Coronavirus OC43 NOT DETECTED NOT DETECTED   Metapneumovirus NOT DETECTED NOT DETECTED   Rhinovirus / Enterovirus NOT DETECTED NOT DETECTED   Influenza A NOT DETECTED NOT DETECTED   Influenza B NOT DETECTED NOT DETECTED   Parainfluenza Virus 1 NOT DETECTED NOT DETECTED   Parainfluenza Virus 2 NOT DETECTED NOT DETECTED   Parainfluenza Virus 3 NOT DETECTED NOT DETECTED   Parainfluenza Virus 4 NOT DETECTED NOT DETECTED   Respiratory Syncytial Virus NOT DETECTED NOT DETECTED   Bordetella pertussis NOT DETECTED NOT DETECTED   Bordetella Parapertussis NOT DETECTED NOT DETECTED   Chlamydophila pneumoniae NOT DETECTED NOT DETECTED   Mycoplasma pneumoniae NOT DETECTED NOT DETECTED    Comment: Performed at [facility], [place] [address], [place], [place] [address]  ECHOCARDIOGRAM COMPLETE     Status: None   Collection Time: 12/25/20  3:37 PM  Result Value Ref Range   Weight 3,200 oz   Height 65 in   BP 94/50 mmHg   Ao pk vel 1.50 m/s   AV Area VTI 2.67 cm2   AR max vel 2.45 cm2   AV Mean grad 5.0 mmHg   AV Peak grad 9.0 mmHg   Single Plane A2C EF 51.2 %   Single Plane A4C EF 43.9 %   Calc EF 47.8 %   S' Lateral 3.00 cm   AV Area mean vel 2.34 cm2   Area-P 1/2 3.33 cm2   MV VTI 1.94  cm2  Potassium     Status: None   Collection Time: 12/25/20  6:05 PM  Result Value Ref Range   Potassium 3.6 3.5 - 5.1 mmol/L    Comment: Performed at [facility], [address] [place], [place] [address]  Glucose, capillary     Status: None   Collection Time: 12/25/20 10:07 PM  Result Value Ref Range   Glucose-Capillary 92 70 - 99 mg/dL    Comment: Glucose reference range applies only to samples taken after fasting for at least 8 hours.  Heparin level (unfractionated)     Status: None   Collection Time: 12/25/20 11:20 PM  Result Value Ref Range   Heparin Unfractionated 0.60 0.30 - 0.70 IU/mL    Comment: (NOTE) If heparin results are below expected values, and patient dosage has  been confirmed, suggest follow up testing of antithrombin III levels. Performed at [facility], [address], [place], [place] [address]   Protime-INR     Status: None   Collection Time: 12/26/20  4:46 AM  Result Value Ref Range  Prothrombin Time 14.8 11.4 - 15.2 seconds   INR 1.2 0.8 - 1.2    Comment: (NOTE) INR goal varies based on device and disease states. Performed at [facility], [address] [place], [place] [address]   Cortisol-am, blood     Status: Abnormal   Collection Time: 12/26/20  4:46 AM  Result Value Ref Range   Cortisol - AM 23.8 (H) 6.7 - 22.6 ug/dL    Comment: Performed at [facility] [address], [place],  [address]  Procalcitonin     Status: None   Collection Time: 12/26/20  4:46 AM  Result Value Ref Range   Procalcitonin 2.17 ng/mL    Comment:        Interpretation: PCT > 2 ng/mL: Systemic infection (sepsis) is likely, unless other causes are known. (NOTE)       Sepsis PCT Algorithm           Lower Respiratory Tract                                      Infection PCT Algorithm    ----------------------------     ----------------------------         PCT < 0.25 ng/mL                PCT < 0.10 ng/mL           Strongly encourage             Strongly discourage   discontinuation of antibiotics    initiation of antibiotics    ----------------------------     -----------------------------       PCT 0.25 - 0.50 ng/mL            PCT 0.10 - 0.25 ng/mL               OR       >80% decrease in PCT            Discourage initiation of                                            antibiotics      Encourage discontinuation           of antibiotics    ----------------------------     -----------------------------         PCT >= 0.50 ng/mL              PCT 0.26 - 0.50 ng/mL               AND       <80% decrease in PCT              Encourage initiation of                                             antibiotics       Encourage continuation           of antibiotics    ----------------------------     -----------------------------        PCT >= 0.50 ng/mL  PCT > 0.50 ng/mL               AND         increase in PCT                  Strongly encourage                                      initiation of antibiotics    Strongly encourage escalation           of antibiotics                                     -----------------------------                                           PCT <= 0.25 ng/mL                                                 OR                                        > 80% decrease in PCT                                      Discontinue / Do not initiate                                             antibiotics  Performed at [facility], [address] [place], [place] [address]   Basic metabolic panel     Status: Abnormal   Collection Time: 12/26/20  4:46 AM  Result Value Ref Range   Sodium 137 135 - 145 mmol/L   Potassium 3.8 3.5 - 5.1 mmol/L   Chloride 110 98 - 111 mmol/L   CO2 17 (L) 22 - 32 mmol/L   Glucose, Bld 83 70 - 99 mg/dL    Comment: Glucose reference range applies only to samples taken after fasting for at least 8 hours.   BUN 34 (H) 8 - 23 mg/dL    Creatinine, Ser 1.27 (H) 0.44 - 1.00 mg/dL   Calcium 7.5 (L) 8.9 - 10.3 mg/dL   GFR, Estimated 42 (L) >60 mL/min    Comment: (NOTE) Calculated using the CKD-EPI Creatinine Equation (2021)    Anion gap 10 5 - 15    Comment: Performed at [facility], [address] [place], [place] [address]  CBC     Status: Abnormal   Collection Time: 12/26/20  4:46 AM  Result Value Ref Range   WBC 8.0 4.0 - 10.5 K/uL   RBC 4.06 3.87 - 5.11 MIL/uL   Hemoglobin 12.2 12.0 - 15.0 g/dL   HCT 37.5 36.0 - 46.0 %   MCV 92.4 80.0 -  100.0 fL   MCH 30.0 26.0 - 34.0 pg   MCHC 32.5 30.0 - 36.0 g/dL   RDW 14.0 11.5 - 15.5 %   Platelets 135 (L) 150 - 400 K/uL   nRBC 0.0 0.0 - 0.2 %    Comment: Performed at [facility], [address] [place], [place] [address]  Magnesium     Status: None   Collection Time: 12/26/20  4:46 AM  Result Value Ref Range   Magnesium 2.2 1.7 - 2.4 mg/dL    Comment: Performed at [facility], [place], [place] [address]  Heparin level (unfractionated)     Status: None   Collection Time: 12/26/20  7:40 AM  Result Value Ref Range   Heparin Unfractionated 0.58 0.30 - 0.70 IU/mL    Comment: (NOTE) If heparin results are below expected values, and patient dosage has  been confirmed, suggest follow up testing of antithrombin III levels. Performed at [facility], [address] [place], [place] [address]   CBC     Status: Abnormal   Collection Time: 12/27/20  4:49 AM  Result Value Ref Range   WBC 7.0 4.0 - 10.5 K/uL   RBC 3.69 (L) 3.87 - 5.11 MIL/uL   Hemoglobin 11.0 (L) 12.0 - 15.0 g/dL   HCT 33.3 (L) 36.0 - 46.0 %   MCV 90.2 80.0 - 100.0 fL   MCH 29.8 26.0 - 34.0 pg   MCHC 33.0 30.0 - 36.0 g/dL   RDW 14.1 11.5 - 15.5 %   Platelets 125 (L) 150 - 400 K/uL   nRBC 0.0 0.0 - 0.2 %    Comment: Performed at [facility], [address], [place], [place] [address]  Basic metabolic panel     Status: Abnormal    Collection Time: 12/27/20  4:49 AM  Result Value Ref Range   Sodium 134 (L) 135 - 145 mmol/L   Potassium 3.3 (L) 3.5 - 5.1 mmol/L   Chloride 106 98 - 111 mmol/L   CO2 19 (L) 22 - 32 mmol/L   Glucose, Bld 93 70 - 99 mg/dL    Comment: Glucose reference range applies only to samples taken after fasting for at least 8 hours.   BUN 31 (H) 8 - 23 mg/dL   Creatinine, Ser 1.30 (H) 0.44 - 1.00 mg/dL   Calcium 7.4 (L) 8.9 - 10.3 mg/dL   GFR, Estimated 41 (L) >60 mL/min    Comment: (NOTE) Calculated using the CKD-EPI Creatinine Equation (2021)    Anion gap 9 5 - 15    Comment: Performed at [facility], [address], [place], [place] [address]  Magnesium     Status: None   Collection Time: 12/27/20  4:49 AM  Result Value Ref Range   Magnesium 1.9 1.7 - 2.4 mg/dL    Comment: Performed at [facility], [address], [place],  [address]  Phosphorus     Status: Abnormal   Collection Time: 12/27/20  4:49 AM  Result Value Ref Range   Phosphorus 2.2 (L) 2.5 - 4.6 mg/dL    Comment: Performed at [facility], [place], [place] [address]  Heparin level (unfractionated)     Status: None   Collection Time: 12/27/20  4:49 AM  Result Value Ref Range   Heparin Unfractionated 0.31 0.30 - 0.70 IU/mL    Comment: (NOTE) If heparin results are below expected values, and patient dosage has  been confirmed, suggest  follow up testing of antithrombin III levels. Performed at [facility], [address] [place], [place] [address]   Basic metabolic panel     Status: Abnormal   Collection Time: 12/28/20  4:33 AM  Result Value Ref Range   Sodium 131 (L) 135 - 145 mmol/L   Potassium 3.6 3.5 - 5.1 mmol/L   Chloride 101 98 - 111 mmol/L   CO2 20 (L) 22 - 32 mmol/L   Glucose, Bld 105 (H) 70 - 99 mg/dL    Comment: Glucose reference range applies only to samples taken after fasting for at least 8 hours.   BUN 32 (H) 8 - 23 mg/dL   Creatinine, Ser  1.50 (H) 0.44 - 1.00 mg/dL   Calcium 8.0 (L) 8.9 - 10.3 mg/dL   GFR, Estimated 35 (L) >60 mL/min    Comment: (NOTE) Calculated using the CKD-EPI Creatinine Equation (2021)    Anion gap 10 5 - 15    Comment: Performed at [facility], [address] [place], [place] [address]  Magnesium     Status: None   Collection Time: 12/28/20  4:33 AM  Result Value Ref Range   Magnesium 2.0 1.7 - 2.4 mg/dL    Comment: Performed at [facility], [address] [place], [place] [address]  CBC with Differential/Platelet     Status: Abnormal   Collection Time: 12/28/20  4:33 AM  Result Value Ref Range   WBC 8.3 4.0 - 10.5 K/uL   RBC 3.86 (L) 3.87 - 5.11 MIL/uL   Hemoglobin 11.5 (L) 12.0 - 15.0 g/dL   HCT 34.9 (L) 36.0 - 46.0 %   MCV 90.4 80.0 - 100.0 fL   MCH 29.8 26.0 - 34.0 pg   MCHC 33.0 30.0 - 36.0 g/dL   RDW 14.1 11.5 - 15.5 %   Platelets 146 (L) 150 - 400 K/uL   nRBC 0.0 0.0 - 0.2 %   Neutrophils Relative % 74 %   Neutro Abs 6.2 1.7 - 7.7 K/uL   Lymphocytes Relative 10 %   Lymphs Abs 0.8 0.7 - 4.0 K/uL   Monocytes Relative 5 %   Monocytes Absolute 0.4 0.1 - 1.0 K/uL   Eosinophils Relative 10 %   Eosinophils Absolute 0.8 (H) 0.0 - 0.5 K/uL   Basophils Relative 0 %   Basophils Absolute 0.0 0.0 - 0.1 K/uL   Immature Granulocytes 1 %   Abs Immature Granulocytes 0.04 0.00 - 0.07 K/uL    Comment: Performed at [facility], [address] [place], [place] [address]  Phosphorus     Status: None   Collection Time: 12/28/20  4:33 AM  Result Value Ref Range   Phosphorus 2.6 2.5 - 4.6 mg/dL    Comment: Performed at [facility], [address] [place], [place] [address]  CBC with Differential/Platelet     Status: Abnormal   Collection Time: 12/29/20  6:54 AM  Result Value Ref Range   WBC 11.9 (H) 4.0 - 10.5 K/uL   RBC 3.80 (L) 3.87 - 5.11 MIL/uL   Hemoglobin 11.4 (L) 12.0 - 15.0 g/dL   HCT 33.9 (L) 36.0 - 46.0 %   MCV 89.2 80.0 - 100.0 fL   MCH  30.0 26.0 - 34.0 pg   MCHC 33.6 30.0 - 36.0 g/dL   RDW 13.8 11.5 - 15.5 %   Platelets 187 150 - 400 K/uL   nRBC 0.0 0.0 - 0.2 %   Neutrophils Relative % 82 %  Neutro Abs 9.8 (H) 1.7 - 7.7 K/uL   Lymphocytes Relative 11 %   Lymphs Abs 1.3 0.7 - 4.0 K/uL   Monocytes Relative 5 %   Monocytes Absolute 0.6 0.1 - 1.0 K/uL   Eosinophils Relative 1 %   Eosinophils Absolute 0.1 0.0 - 0.5 K/uL   Basophils Relative 0 %   Basophils Absolute 0.0 0.0 - 0.1 K/uL   WBC Morphology MORPHOLOGY UNREMARKABLE    RBC Morphology MORPHOLOGY UNREMARKABLE    Smear Review Normal platelet morphology    Immature Granulocytes 1 %   Abs Immature Granulocytes 0.16 (H) 0.00 - 0.07 K/uL    Comment: Performed at [facility], [address] [place], [place] [address]  Basic metabolic panel     Status: Abnormal   Collection Time: 12/29/20  6:54 AM  Result Value Ref Range   Sodium 135 135 - 145 mmol/L   Potassium 4.0 3.5 - 5.1 mmol/L   Chloride 103 98 - 111 mmol/L   CO2 23 22 - 32 mmol/L   Glucose, Bld 135 (H) 70 - 99 mg/dL    Comment: Glucose reference range applies only to samples taken after fasting for at least 8 hours.   BUN 33 (H) 8 - 23 mg/dL   Creatinine, Ser 1.11 (H) 0.44 - 1.00 mg/dL   Calcium 8.4 (L) 8.9 - 10.3 mg/dL   GFR, Estimated 50 (L) >60 mL/min    Comment: (NOTE) Calculated using the CKD-EPI Creatinine Equation (2021)    Anion gap 9 5 - 15    Comment: Performed at [facility], [address] [place], [place] [address]  Magnesium     Status: None   Collection Time: 12/29/20  6:54 AM  Result Value Ref Range   Magnesium 2.2 1.7 - 2.4 mg/dL    Comment: Performed at [facility], [address] [place], [place] [address]  Brain natriuretic peptide     Status: Abnormal   Collection Time: 12/30/20  4:53 AM  Result Value Ref Range   B Natriuretic Peptide 354.5 (H) 0.0 - 100.0 pg/mL    Comment: Performed at [facility], [address],  [place], [place] [address]    Radiology No results found.  Assessment/Plan CKD (chronic kidney disease) stage 3, GFR 30-59 ml/min Another concern for possible renal artery issues.  Blood pressure control is important for reducing progression of her chronic kidney disease.  Renovascular hypertension Blood pressure control is much better after renal artery stent placement a year ago.  History of recurrent UTIs The patient has had 3 recurring UTIs over the past several months.  She previously saw urologist who has retired.  I think would be reasonable to see the urologist again.  We can provide a referral.  Renal artery stenosis (HCC) Her duplex today shows no significant right renal artery stenosis and a widely patent stent in the left renal artery without significant recurrent stenosis. This is highly unlikely to be a cause of a recurring UTI as this is not in the urinary stream but rather in the bloodstream.  The daughter was concerned about this.  Her renal artery duplex looks good today.  We can go to an annual follow-up at this point.    Leotis Pain, MD  02/16/2021 11:24 AM    This note was created with Dragon medical transcription system.  Any errors from dictation are purely unintentional

## 2021-02-18 DIAGNOSIS — M7989 Other specified soft tissue disorders: Secondary | ICD-10-CM | POA: Diagnosis not present

## 2021-02-18 DIAGNOSIS — Z87440 Personal history of urinary (tract) infections: Secondary | ICD-10-CM | POA: Diagnosis not present

## 2021-02-18 DIAGNOSIS — L03116 Cellulitis of left lower limb: Secondary | ICD-10-CM | POA: Diagnosis not present

## 2021-02-18 DIAGNOSIS — N390 Urinary tract infection, site not specified: Secondary | ICD-10-CM | POA: Diagnosis not present

## 2021-02-18 DIAGNOSIS — R350 Frequency of micturition: Secondary | ICD-10-CM | POA: Diagnosis not present

## 2021-02-18 DIAGNOSIS — N3281 Overactive bladder: Secondary | ICD-10-CM | POA: Diagnosis not present

## 2021-03-03 ENCOUNTER — Encounter: Payer: Self-pay | Admitting: Urology

## 2021-03-03 ENCOUNTER — Other Ambulatory Visit: Payer: Self-pay

## 2021-03-03 ENCOUNTER — Ambulatory Visit: Payer: PPO | Admitting: Urology

## 2021-03-03 VITALS — BP 159/77 | HR 72 | Ht 64.0 in | Wt 215.0 lb

## 2021-03-03 DIAGNOSIS — N3281 Overactive bladder: Secondary | ICD-10-CM | POA: Diagnosis not present

## 2021-03-03 DIAGNOSIS — N390 Urinary tract infection, site not specified: Secondary | ICD-10-CM | POA: Diagnosis not present

## 2021-03-03 LAB — BLADDER SCAN AMB NON-IMAGING

## 2021-03-03 MED ORDER — PREMARIN 0.625 MG/GM VA CREA: TOPICAL_CREAM | VAGINAL | 0 refills | Status: DC

## 2021-03-03 MED ORDER — MIRABEGRON ER 25 MG PO TB24: 25.0000 mg | ORAL_TABLET | Freq: Every day | ORAL | 0 refills | Status: DC

## 2021-03-03 NOTE — Progress Notes (Signed)
03/03/2021  2:00 PM   Julie Mcbride 06-02-1936 [id number]  Referring provider: Dion Body, MD [place] [facility] [place],  [place] [address] Chief Complaint  Patient presents with  . Over Active Bladder    New Patient    HPI: Julie Mcbride is a 85 y.o. female who presents today for further evaluation of recurrent UTIs and OAB.  Patient has CKD stage III.  She was hospitalized for a week in 11/2020 for weakness. Discharge summary stated that she was diagnosed with pneumonia.  UA was suspicious for infection but culture was negative.  Patient was last seen on 02/18/2021 at [facility] for abdominal pain/bladder pressure. She was referred to clinic by Dr. Dion Body for abdominal pressure and urinary frequency x1 week. Patient had been experiencing not being able to make it to the bathroom before she voids.   02/05/2021 was her one true documented UTI, klebsiella, which was resistant to amp but otherwise pan-sensitive.  She was also seen on 02/16/2021 for patency of her renal artery stent placed 1 year ago. Duplex showed no significant right renal artery stenosis and a widely patent stent in the left renal artery without significant recurrent stenosis.  Patient stopped taking oxybutynin after a month due to induced blurry vision.   Most recent UA with microscopic on 02/18/2021 showed rare bacteria, otherwise was unremarkable. However, UA with microscopic from 02/05/2021 showed moderate leukocytes (10-50), no hematuria, and many bacteria.  Today the the patient complains of pressure after voiding every 2 hours or so. Patient reports that the recurring UTI symptoms, have been going on since 11/2020. Patient reports that she has been having urinary frequency and nocturia symptoms for "years."   She denies burning upon urination, or hematuria. Patient denies any vaginal bulging, or constipation.  Patient reports achy feeling dry around her labia and vagina.  She has had a complete hysterectomy from passing clots and a family history of ovarian cancer. Mother died from ovarian cancer.  PVR from today's visit was 0 mL.  Patient has a history if nephrolithiasis.  Patient denies being sexually active.  She has seen Dr. Ernst Spell and Larene Beach in the remote past for hemorrhagic UTIs.   PMH: Past Medical History:  Diagnosis Date  . Arthritis    Knees, hands  . Basal cell carcinoma    back - removed  . Bladder polyp    With previos hematuria  . Diverticulitis   . Dyspnea on exertion    Chronic  . GERD (gastroesophageal reflux disease)   . History of ETT    Myoview 7/10 EF 78%, no ischemia or infarction. Poor exercise tolerance (2'30", stopped due to fatigue). Reached 86% MPHR.  Marland Kitchen Hypertension   . Kidney stones   . Multiple allergies    Chronic  . Palpitations    Holter 7/10 with rare PACs  . Postmenopausal   . S/P hysterectomy   . S/P laminectomy   . 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 ABDOMINAL HYSTERECTOMY    . ANKLE FRACTURE SURGERY Right 9/14   Dr. Ulice Dash  . BACK SURGERY    . BASAL CELL CARCINOMA EXCISION     back  . BREAST BIOPSY Right 06/19/2017   Affirm Bx- Radial Scar  . BREAST EXCISIONAL BIOPSY Right 1996   neg  . BREAST LUMPECTOMY WITH NEEDLE LOCALIZATION Right 08/11/2017  Procedure: BREAST LUMPECTOMY WITH NEEDLE LOCALIZATION;  Surgeon: Leonie Green, MD;  Location: [facility];  Service: General;  Laterality: Right;  . CARPAL TUNNEL RELEASE Right   . CATARACT EXTRACTION W/ INTRAOCULAR LENS  IMPLANT, BILATERAL    . EXCISION OF BREAST BIOPSY Right 08/11/2017   excision of radial Scar  . EYE SURGERY    . JOINT REPLACEMENT Right    knee  . KNEE ARTHROSCOPY Right 04/22/2016   Procedure: ARTHROSCOPY KNEE;  Surgeon: Leanor Kail, MD;  Location: [place];  Service: Orthopedics;   Laterality: Right;  . knee replacement Right 01/11/2017  . NASAL SINUS SURGERY  06/05/14   Dr. Pryor Ochoa  . POSTERIOR CERVICAL LAMINECTOMY  2005   C5-7; plate and screws  . RENAL ANGIOGRAPHY Left 04/20/2020   Procedure: RENAL ANGIOGRAPHY;  Surgeon: Algernon Huxley, MD;  Location: [place] [facility];  Service: Cardiovascular;  Laterality: Left;  . TONSILLECTOMY      Home Medications:  Allergies as of 03/03/2021      Reactions   Chlorhexidine Hives   SAGE wipes caused severe rash   Cyclobenzaprine Hcl    Doesn't remember reaction   Etodolac    Doesn't remember reaction   Lisinopril    Coughing up blood   Meloxicam    Doesn't remember reaction   Metaxalone    Doesn't remember reaction   Neomycin-bacitracin Zn-polymyx    Skin irritation   Nifedipine Swelling   Nitrofurantoin    Doesn't remember reaction   Sulfamethoxazole-trimethoprim Hives   Sulfonamide Derivatives Hives   Tizanidine    Liver problems   Vasotec [enalapril Maleate]    Doesn't remember reaction   Ibuprofen Rash   Tape Rash      Medication List       Accurate as of March 03, 2021  2:00 PM. If you have any questions, ask your nurse or doctor.        atorvastatin 10 MG tablet Commonly known as: Lipitor Take 1 tablet (10 mg total) by mouth daily.   bisoprolol-hydrochlorothiazide 10-6.25 MG tablet Commonly known as: ZIAC Take 1 tablet by mouth daily.   CALCIUM CARBONATE PO Take 300 mg by mouth.   clobetasol ointment 0.05 % Commonly known as: TEMOVATE Apply 1 application topically 2 (two) times daily as needed for rash.   clopidogrel 75 MG tablet Commonly known as: Plavix Take 1 tablet (75 mg total) by mouth daily.   fluticasone 50 MCG/ACT nasal spray Commonly known as: FLONASE Place 2 sprays into both nostrils as needed for allergies or rhinitis.   hydrALAZINE 50 MG tablet Commonly known as: APRESOLINE Take 50 mg by mouth 3 (three) times daily.   loratadine 10 MG tablet Commonly known as:  CLARITIN Take 10 mg by mouth daily as needed for allergies.   losartan 50 MG tablet Commonly known as: COZAAR Take 50 mg by mouth daily.   magic mouthwash w/lidocaine Soln Take 5 mLs by mouth every 6 (six) hours as needed for mouth pain.   mirabegron ER 25 MG Tb24 tablet Commonly known as: MYRBETRIQ Take 1 tablet (25 mg total) by mouth daily. Started by: Hollice Espy, MD   multivitamin capsule Take 2 capsules by mouth daily. [facility] MVI   Premarin vaginal cream Generic drug: conjugated estrogens Estrogen Cream Instruction  Discard applicator  Apply pea sized amount to tip of finger to urethra before bed. Wash hands well after application. Use Monday, Wednesday and Friday Started by: Hollice Espy, MD   rOPINIRole 0.25  MG tablet Commonly known as: REQUIP Take 0.5 mg by mouth at bedtime.   Vitamin D 50 MCG (2000 UT) tablet Take 4,000 Units by mouth daily.       Allergies: Allergies  Allergen Reactions  . Chlorhexidine Hives    SAGE wipes caused severe rash  . Cyclobenzaprine Hcl     Doesn't remember reaction  . Etodolac     Doesn't remember reaction  . Lisinopril     Coughing up blood  . Meloxicam     Doesn't remember reaction  . Metaxalone     Doesn't remember reaction  . Neomycin-Bacitracin Zn-Polymyx     Skin irritation  . Nifedipine Swelling  . Nitrofurantoin     Doesn't remember reaction  . Sulfamethoxazole-Trimethoprim Hives  . Sulfonamide Derivatives Hives  . Tizanidine     Liver problems  . Vasotec [Enalapril Maleate]     Doesn't remember reaction  . Ibuprofen Rash  . Tape Rash    Family History: Family History  Problem Relation Age of Onset  . Ovarian cancer Mother   . Stroke Father        CVA  . Breast cancer Daughter 78  . Breast cancer Maternal Aunt 60  . Breast cancer Maternal Aunt 90  . Prostate cancer Neg Hx   . Bladder Cancer Neg Hx   . Kidney cancer Neg Hx     Social History:   reports that she has never smoked. She has  never used smokeless tobacco. She reports that she does not drink alcohol and does not use drugs.   Physical Exam: BP (!) 159/77   Pulse 72   Ht 5\' 4"  (1.626 m)   Wt 215 lb (97.5 kg)   BMI 36.90 kg/m   Constitutional:  Alert and oriented, No acute distress. HEENT: [place] AT, moist mucus membranes.  Trachea midline, no masses. Cardiovascular: No clubbing, cyanosis, or edema. Respiratory: Normal respiratory effort, no increased work of breathing. Skin: No rashes, bruises or suspicious lesions. Neurologic: Grossly intact, no focal deficits, moving all 4 extremities. Psychiatric: Normal mood and affect.  Results for orders placed or performed in visit on 03/03/21  Microscopic Examination   Urine  Result Value Ref Range   WBC, UA 0-5 0 - 5 /hpf   RBC None seen 0 - 2 /hpf   Epithelial Cells (non renal) 0-10 0 - 10 /hpf   Bacteria, UA Moderate (A) None seen/Few  Urinalysis, Complete  Result Value Ref Range   Specific Gravity, UA 1.025 1.005 - 1.030   pH, UA 7.0 5.0 - 7.5   Color, UA Yellow Yellow   Appearance Ur Clear Clear   Leukocytes,UA Trace (A) Negative   Protein,UA Negative Negative/Trace   Glucose, UA Negative Negative   Ketones, UA Negative Negative   RBC, UA Negative Negative   Bilirubin, UA Negative Negative   Urobilinogen, Ur 0.2 0.2 - 1.0 mg/dL   Nitrite, UA Negative Negative   Microscopic Examination See below:   BLADDER SCAN AMB NON-IMAGING  Result Value Ref Range   Scan Result 34ml      Assessment & Plan:    1. OAB Emptying adequately without any warning symptoms Urinalysis today is benign OAB symptoms longstanding, on unable to tolerate cholinergics due to dry eyes Prescribed a 25 mg sample of myrbetriq. X 1 month Ressess in 1 month  2. Recurrent UTIs Start topical estrogen cream three times daily to apply to urethra to alleviate dryness and decrease risk of infection. Discussed hygiene issues.  Start cranberry tablets twice daily. Start taking a  probiotic once per day.   Follow Up:  F/u with Zara Council PA-C in 1 month (03/31/2021).   Jaclyn Shaggy, am acting as a scribe for Dr. Hollice Espy.   I have reviewed the above documentation for accuracy and completeness, and I agree with the above.   Hollice Espy, MD  [facility] [address] [place], [place] [address] [phone]  Patient was provided with the following AVs:  We discussed the following today in clinic:  1.  Start using the topical estrogen cream as outlined above  2.  Recommend using twice daily cranberry tablets supplements  3.  Ensure that your daily supplement does have a probiotic in it  4.  If you have any UTI symptoms, call us and let us know.  We will happy to accommodate the same day or next a work in for a UTI visit.  5.  We have given you samples of Myrbetriq 25 mg to try for your overactive bladder.  We will see how this goes at your follow-up visit if we need to adjust, change the medication or the dose depending on the results.

## 2021-03-03 NOTE — Patient Instructions (Addendum)
Estrogen Cream Instruction  Discard applicator  Apply pea sized amount to tip of finger to urethra before bed. Wash hands well after application. Use Monday, Wednesday and Friday     We discussed the following today in clinic:  1.  Start using the topical estrogen cream as outlined above  2.  Recommend using twice daily cranberry tablets supplements  3.  Ensure that your daily supplement does have a probiotic in it  4.  If you have any UTI symptoms, call us and let us know.  We will happy to accommodate the same day or next a work in for a UTI visit.  5.  We have given you samples of Myrbetriq 25 mg to try for your overactive bladder.  We will see how this goes at your follow-up visit if we need to adjust, change the medication or the dose depending on the results.

## 2021-03-04 LAB — URINALYSIS, COMPLETE
Bilirubin, UA: NEGATIVE
Glucose, UA: NEGATIVE
Ketones, UA: NEGATIVE
Nitrite, UA: NEGATIVE
Protein,UA: NEGATIVE
RBC, UA: NEGATIVE
Specific Gravity, UA: 1.025 (ref 1.005–1.030)
Urobilinogen, Ur: 0.2 mg/dL (ref 0.2–1.0)
pH, UA: 7 (ref 5.0–7.5)

## 2021-03-04 LAB — MICROSCOPIC EXAMINATION: RBC, Urine: NONE SEEN /hpf (ref 0–2)

## 2021-03-26 DIAGNOSIS — I252 Old myocardial infarction: Secondary | ICD-10-CM | POA: Diagnosis not present

## 2021-03-30 NOTE — Progress Notes (Signed)
03/31/2021 1:30 PM   Julie Mcbride 1936-08-13 [id number]  Referring provider: Dion Body, MD [place] [facility] [place],  [place] [address]  Chief Complaint  Patient presents with  . Recurrent UTI   Urological history: 1. rUTI's  -contributing factors of age, vaginal atrophy, incontinence and diuretics -documented positive urine cultures over the last year  Klebsiella pneumoniae resistant to ampicillin on 02/05/2021  Pseudomonas aeruginosa pan-sensitive on 01/08/2021   2. OAB -contributing factors of age, vaginal atrophy and diuretics   -intolerable side effects with anticholinergics -managed with Myrbetriq 25 mg daily   HPI: Julie Mcbride is a 85 y.o. female who presents today for one month follow up.  She is experiencing 8 or more daytime urinations, 3 or more nighttime urinations with a strong urge to urinate.  She has episodes of urge incontinence that is leaking 1-2 times daily.  She is always wearing absorbent pads for leakage.  She wears 2 panty liners daily.  She is not limiting fluid intake in an effort to reduce urination.  She is engaged in toilet mapping.  She states the Myrbetriq 25 mg samples helped with the bladder pressure.  Patient denies any modifying or aggravating factors.  Patient denies any gross hematuria, dysuria or suprapubic/flank pain.  Patient denies any fevers, chills, nausea or vomiting.   PVR is 0 mL.   PMH: Past Medical History:  Diagnosis Date  . Arthritis    Knees, hands  . Basal cell carcinoma    back - removed  . Bladder polyp    With previos hematuria  . Diverticulitis   . Dyspnea on exertion    Chronic  . GERD (gastroesophageal reflux disease)   . History of ETT    Myoview 7/10 EF 78%, no ischemia or infarction. Poor exercise tolerance (2'30", stopped due to fatigue). Reached 86% MPHR.  Marland Kitchen Hypertension   . Kidney stones   . Multiple allergies    Chronic  . Palpitations    Holter 7/10  with rare PACs  . Postmenopausal   . S/P hysterectomy   . S/P laminectomy   . 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 ABDOMINAL HYSTERECTOMY    . ANKLE FRACTURE SURGERY Right 9/14   Dr. Ulice Dash  . BACK SURGERY    . BASAL CELL CARCINOMA EXCISION     back  . BREAST BIOPSY Right 06/19/2017   Affirm Bx- Radial Scar  . BREAST EXCISIONAL BIOPSY Right 1996   neg  . BREAST LUMPECTOMY WITH NEEDLE LOCALIZATION Right 08/11/2017   Procedure: BREAST LUMPECTOMY WITH NEEDLE LOCALIZATION;  Surgeon: Leonie Green, MD;  Location: [facility];  Service: General;  Laterality: Right;  . CARPAL TUNNEL RELEASE Right   . CATARACT EXTRACTION W/ INTRAOCULAR LENS  IMPLANT, BILATERAL    . EXCISION OF BREAST BIOPSY Right 08/11/2017   excision of radial Scar  . EYE SURGERY    . JOINT REPLACEMENT Right    knee  . KNEE ARTHROSCOPY Right 04/22/2016   Procedure: ARTHROSCOPY KNEE;  Surgeon: Leanor Kail, MD;  Location: [place];  Service: Orthopedics;  Laterality: Right;  . knee replacement Right 01/11/2017  . NASAL SINUS SURGERY  06/05/14   Dr. Pryor Ochoa  . POSTERIOR CERVICAL LAMINECTOMY  2005   C5-7; plate and screws  . RENAL ANGIOGRAPHY Left 04/20/2020   Procedure: RENAL ANGIOGRAPHY;  Surgeon:  Algernon Huxley, MD;  Location: [place] [facility];  Service: Cardiovascular;  Laterality: Left;  . TONSILLECTOMY      Home Medications:  Allergies as of 03/31/2021      Reactions   Chlorhexidine Hives   SAGE wipes caused severe rash   Cyclobenzaprine Hcl    Doesn't remember reaction   Etodolac    Doesn't remember reaction   Lisinopril    Coughing up blood   Meloxicam    Doesn't remember reaction   Metaxalone    Doesn't remember reaction   Neomycin-bacitracin Zn-polymyx    Skin irritation   Nifedipine Swelling   Nitrofurantoin    Doesn't remember reaction    Sulfamethoxazole-trimethoprim Hives   Sulfonamide Derivatives Hives   Tizanidine    Liver problems   Vasotec [enalapril Maleate]    Doesn't remember reaction   Ibuprofen Rash   Tape Rash      Medication List       Accurate as of Mar 31, 2021  1:30 PM. If you have any questions, ask your nurse or doctor.        atorvastatin 10 MG tablet Commonly known as: Lipitor Take 1 tablet (10 mg total) by mouth daily.   bisoprolol-hydrochlorothiazide 10-6.25 MG tablet Commonly known as: ZIAC Take 1 tablet by mouth daily.   CALCIUM CARBONATE PO Take 300 mg by mouth.   clobetasol ointment 0.05 % Commonly known as: TEMOVATE Apply 1 application topically 2 (two) times daily as needed for rash.   clopidogrel 75 MG tablet Commonly known as: Plavix Take 1 tablet (75 mg total) by mouth daily.   fluticasone 50 MCG/ACT nasal spray Commonly known as: FLONASE Place 2 sprays into both nostrils as needed for allergies or rhinitis.   hydrALAZINE 50 MG tablet Commonly known as: APRESOLINE Take 50 mg by mouth 3 (three) times daily.   loratadine 10 MG tablet Commonly known as: CLARITIN Take 10 mg by mouth daily as needed for allergies.   losartan 50 MG tablet Commonly known as: COZAAR Take 50 mg by mouth daily.   magic mouthwash w/lidocaine Soln Take 5 mLs by mouth every 6 (six) hours as needed for mouth pain.   mirabegron ER 50 MG Tb24 tablet Commonly known as: MYRBETRIQ Take 1 tablet (50 mg total) by mouth daily. What changed:   medication strength  how much to take Changed by: Sheneka Schrom, PA-C   multivitamin capsule Take 2 capsules by mouth daily. [employer] MVI   Premarin vaginal cream Generic drug: conjugated estrogens Estrogen Cream Instruction  Discard applicator  Apply pea sized amount to tip of finger to urethra before bed. Wash hands well after application. Use Monday, Wednesday and Friday   rOPINIRole 0.25 MG tablet Commonly known as: REQUIP Take 0.5 mg by  mouth at bedtime.   Vitamin D 50 MCG (2000 UT) tablet Take 4,000 Units by mouth daily.       Allergies:  Allergies  Allergen Reactions  . Chlorhexidine Hives    SAGE wipes caused severe rash  . Cyclobenzaprine Hcl     Doesn't remember reaction  . Etodolac     Doesn't remember reaction  . Lisinopril     Coughing up blood  . Meloxicam     Doesn't remember reaction  . Metaxalone     Doesn't remember reaction  . Neomycin-Bacitracin Zn-Polymyx     Skin irritation  . Nifedipine Swelling  . Nitrofurantoin     Doesn't remember reaction  . Sulfamethoxazole-Trimethoprim Hives  . Sulfonamide  Derivatives Hives  . Tizanidine     Liver problems  . Vasotec [Enalapril Maleate]     Doesn't remember reaction  . Ibuprofen Rash  . Tape Rash    Family History: Family History  Problem Relation Age of Onset  . Ovarian cancer Mother   . Stroke Father        CVA  . Breast cancer Daughter 53  . Breast cancer Maternal Aunt 60  . Breast cancer Maternal Aunt 90  . Prostate cancer Neg Hx   . Bladder Cancer Neg Hx   . Kidney cancer Neg Hx     Social History:  reports that she has never smoked. She has never used smokeless tobacco. She reports that she does not drink alcohol and does not use drugs.  ROS: Pertinent ROS in HPI  Physical Exam: BP 139/79   Pulse 78   Ht 5\' 3"  (1.6 m)   Wt 216 lb (98 kg)   BMI 38.26 kg/m   Constitutional:  Well nourished. Alert and oriented, No acute distress. HEENT: [place] AT, mask in place.  Trachea midline Cardiovascular: No clubbing, cyanosis, or edema. Respiratory: Normal respiratory effort, no increased work of breathing. Neurologic: Grossly intact, no focal deficits, moving all 4 extremities. Psychiatric: Normal mood and affect.   Laboratory Data: Lab Results  Component Value Date   WBC 11.9 (H) 12/29/2020   HGB 11.4 (L) 12/29/2020   HCT 33.9 (L) 12/29/2020   MCV 89.2 12/29/2020   PLT 187 12/29/2020    Lab Results  Component Value Date    CREATININE 1.11 (H) 12/29/2020    Urinalysis    Component Value Date/Time   COLORURINE YELLOW (A) 12/25/2020 0615   APPEARANCEUR Clear 03/03/2021 1321   LABSPEC 1.014 12/25/2020 0615   LABSPEC 1.021 09/08/2013 0127   PHURINE 5.0 12/25/2020 0615   GLUCOSEU Negative 03/03/2021 1321   GLUCOSEU Negative 09/08/2013 0127   HGBUR MODERATE (A) 12/25/2020 0615   BILIRUBINUR Negative 03/03/2021 1321   BILIRUBINUR Negative 09/08/2013 0127   KETONESUR NEGATIVE 12/25/2020 0615   PROTEINUR Negative 03/03/2021 1321   PROTEINUR 30 (A) 12/25/2020 0615   NITRITE Negative 03/03/2021 1321   NITRITE POSITIVE (A) 12/25/2020 0615   LEUKOCYTESUR Trace (A) 03/03/2021 1321   LEUKOCYTESUR MODERATE (A) 12/25/2020 0615   LEUKOCYTESUR Negative 09/08/2013 0127    I have reviewed the labs.   Pertinent Imaging: Results for DOLOREZ, JEFFREY "PAT" (MRN [id number]) as of 03/31/2021 13:28  Ref. Range 03/31/2021 13:04  Scan Result Unknown 0   Assessment & Plan:    1. OAB -We will increase Myrbetriq to 50 mg daily to see if this would aid in reducing her pelvic pressure  2. rUTI's -Asymptomatic at this visit  3.  Vaginal atrophy -She will continue the vaginal estrogen cream 3 nights weekly -I have sent a prescription over to her pharmacy at this time  Return in about 1 month (around 05/01/2021) for PVR and OAB questionnaire.  These notes generated with voice recognition software. I apologize for typographical errors.  Zara Council, PA-C  [facility] [address]  [place], [place] [address] [phone]

## 2021-03-31 ENCOUNTER — Encounter: Payer: Self-pay | Admitting: Urology

## 2021-03-31 ENCOUNTER — Ambulatory Visit: Payer: PPO | Admitting: Urology

## 2021-03-31 ENCOUNTER — Other Ambulatory Visit: Payer: Self-pay

## 2021-03-31 VITALS — BP 139/79 | HR 78 | Ht 63.0 in | Wt 216.0 lb

## 2021-03-31 DIAGNOSIS — N952 Postmenopausal atrophic vaginitis: Secondary | ICD-10-CM

## 2021-03-31 DIAGNOSIS — N3281 Overactive bladder: Secondary | ICD-10-CM

## 2021-03-31 DIAGNOSIS — N390 Urinary tract infection, site not specified: Secondary | ICD-10-CM

## 2021-03-31 LAB — BLADDER SCAN AMB NON-IMAGING: Scan Result: 0

## 2021-03-31 MED ORDER — PREMARIN 0.625 MG/GM VA CREA: TOPICAL_CREAM | VAGINAL | 0 refills | Status: DC

## 2021-03-31 MED ORDER — MIRABEGRON ER 50 MG PO TB24: 50.0000 mg | ORAL_TABLET | Freq: Every day | ORAL | 0 refills | Status: DC

## 2021-03-31 NOTE — Patient Instructions (Signed)
Follow up in one month.

## 2021-04-01 ENCOUNTER — Other Ambulatory Visit: Payer: Self-pay | Admitting: Urology

## 2021-04-01 ENCOUNTER — Telehealth: Payer: Self-pay | Admitting: Urology

## 2021-04-01 DIAGNOSIS — N3281 Overactive bladder: Secondary | ICD-10-CM

## 2021-04-01 DIAGNOSIS — N390 Urinary tract infection, site not specified: Secondary | ICD-10-CM

## 2021-04-01 MED ORDER — PREMARIN 0.625 MG/GM VA CREA: TOPICAL_CREAM | VAGINAL | 0 refills | Status: DC

## 2021-04-01 NOTE — Telephone Encounter (Signed)
Nori Riis, PA-C at 04/01/2021 3:36 PM  Status: Signed  Please let Mrs. Pietila know that I have sent in the script for Premarin for her.

## 2021-04-01 NOTE — Telephone Encounter (Signed)
Patient would like the prescription for Premarin cream to be sent to the [facility] pharmacy. She was given a sample at her office visit on 5/4, but would like the prescription.  Please advise patient when it has been completed.

## 2021-04-01 NOTE — Progress Notes (Signed)
Please let Julie Mcbride know that I have sent in the script for Premarin for her.

## 2021-04-01 NOTE — Telephone Encounter (Signed)
Pt has a VM that has not been set up yet, will try again later.

## 2021-04-01 NOTE — Telephone Encounter (Signed)
Patient added that she did not receive samples of Premarin in the office.  It looks like the prescription may have an error of "sample".

## 2021-04-02 DIAGNOSIS — I10 Essential (primary) hypertension: Secondary | ICD-10-CM | POA: Diagnosis not present

## 2021-04-02 DIAGNOSIS — N1831 Chronic kidney disease, stage 3a: Secondary | ICD-10-CM | POA: Diagnosis not present

## 2021-04-02 DIAGNOSIS — I252 Old myocardial infarction: Secondary | ICD-10-CM | POA: Diagnosis not present

## 2021-04-02 DIAGNOSIS — E6609 Other obesity due to excess calories: Secondary | ICD-10-CM | POA: Diagnosis not present

## 2021-04-02 DIAGNOSIS — J0110 Acute frontal sinusitis, unspecified: Secondary | ICD-10-CM | POA: Diagnosis not present

## 2021-04-02 MED ORDER — PREMARIN 0.625 MG/GM VA CREA: TOPICAL_CREAM | VAGINAL | 11 refills | Status: DC

## 2021-04-02 NOTE — Telephone Encounter (Signed)
Pt calls back she states that rx is still not at pharmacy, appears rx was not changed from sample to normal. RX changed and sent in. Pt informed.

## 2021-05-03 NOTE — Progress Notes (Signed)
05/04/2021 1:20 PM   Julie Mcbride Feb 22, 1936 [id number]  Referring provider: Dion Body, MD [place] [facility] [place],  [place] [address]  Chief Complaint  Patient presents with  . Over Active Bladder   Urological history: 1. rUTI's  -contributing factors of age, vaginal atrophy, incontinence and diuretics -documented positive urine cultures over the last year  Klebsiella pneumoniae resistant to ampicillin on 02/05/2021  Pseudomonas aeruginosa pan-sensitive on 01/08/2021   2. OAB -contributing factors of age, vaginal atrophy and diuretics   -intolerable side effects with anticholinergics -managed with Myrbetriq 50 mg daily   HPI: Julie Mcbride is a 85 y.o. female who presents today for one month follow up.  She is experiencing 1-7 daytime voids which is improved from last month, 3 or more overnight voids which is stable with a mild urge to urinate which is stable.  She is experiencing urge incontinence.  She is experiencing leakage 1-2 times daily which is stable.  She does wear pads continuously for the leakage.  She is going through 2 panty liners daily which is stable.  And she does engage in toilet mapping.  Patient denies any modifying or aggravating factors.  Patient denies any gross hematuria, dysuria or suprapubic/flank pain.  Patient denies any fevers, chills, nausea or vomiting.   PVR is 18 mL.   She states that the Myrbetriq 50 mg help with her daytime symptoms, but she still has persistent nocturia x3.  She states that she does not drink any liquids after dinner.  She drinks only water and decaffeinated coffee in the morning.  She denies any pedal edema and she takes all her blood pressure medications in the morning.  PMH: Past Medical History:  Diagnosis Date  . Arthritis    Knees, hands  . Basal cell carcinoma    back - removed  . Bladder polyp    With previos hematuria  . Diverticulitis   . Dyspnea on exertion     Chronic  . GERD (gastroesophageal reflux disease)   . History of ETT    Myoview 7/10 EF 78%, no ischemia or infarction. Poor exercise tolerance (2'30", stopped due to fatigue). Reached 86% MPHR.  Marland Kitchen Hypertension   . Kidney stones   . Multiple allergies    Chronic  . Palpitations    Holter 7/10 with rare PACs  . Postmenopausal   . S/P hysterectomy   . S/P laminectomy   . 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 ABDOMINAL HYSTERECTOMY    . ANKLE FRACTURE SURGERY Right 9/14   Dr. Ulice Dash  . BACK SURGERY    . BASAL CELL CARCINOMA EXCISION     back  . BREAST BIOPSY Right 06/19/2017   Affirm Bx- Radial Scar  . BREAST EXCISIONAL BIOPSY Right 1996   neg  . BREAST LUMPECTOMY WITH NEEDLE LOCALIZATION Right 08/11/2017   Procedure: BREAST LUMPECTOMY WITH NEEDLE LOCALIZATION;  Surgeon: Leonie Green, MD;  Location: [facility];  Service: General;  Laterality: Right;  . CARPAL TUNNEL RELEASE Right   . CATARACT EXTRACTION W/ INTRAOCULAR LENS  IMPLANT, BILATERAL    . EXCISION OF BREAST BIOPSY Right 08/11/2017   excision of radial Scar  . EYE SURGERY    . JOINT REPLACEMENT Right    knee  . KNEE ARTHROSCOPY Right 04/22/2016   Procedure: ARTHROSCOPY KNEE;  Surgeon: Leanor Kail,  MD;  Location: [place];  Service: Orthopedics;  Laterality: Right;  . knee replacement Right 01/11/2017  . NASAL SINUS SURGERY  06/05/14   Dr. Pryor Ochoa  . POSTERIOR CERVICAL LAMINECTOMY  2005   C5-7; plate and screws  . RENAL ANGIOGRAPHY Left 04/20/2020   Procedure: RENAL ANGIOGRAPHY;  Surgeon: Algernon Huxley, MD;  Location: [place] [facility];  Service: Cardiovascular;  Laterality: Left;  . TONSILLECTOMY      Home Medications:  Allergies as of 05/04/2021      Reactions   Chlorhexidine Hives   SAGE wipes caused severe rash   Cyclobenzaprine Hcl    Doesn't remember reaction   Etodolac     Doesn't remember reaction   Lisinopril    Coughing up blood   Meloxicam    Doesn't remember reaction   Metaxalone    Doesn't remember reaction   Neomycin-bacitracin Zn-polymyx    Skin irritation   Nifedipine Swelling   Nitrofurantoin    Doesn't remember reaction   Sulfamethoxazole-trimethoprim Hives   Sulfonamide Derivatives Hives   Tizanidine    Liver problems   Vasotec [enalapril Maleate]    Doesn't remember reaction   Ibuprofen Rash   Tape Rash      Medication List       Accurate as of May 04, 2021  1:20 PM. If you have any questions, ask your nurse or doctor.        atorvastatin 10 MG tablet Commonly known as: Lipitor Take 1 tablet (10 mg total) by mouth daily.   bisoprolol-hydrochlorothiazide 10-6.25 MG tablet Commonly known as: ZIAC Take 1 tablet by mouth daily.   CALCIUM CARBONATE PO Take 300 mg by mouth.   clobetasol ointment 0.05 % Commonly known as: TEMOVATE Apply 1 application topically 2 (two) times daily as needed for rash.   clopidogrel 75 MG tablet Commonly known as: Plavix Take 1 tablet (75 mg total) by mouth daily.   fluticasone 50 MCG/ACT nasal spray Commonly known as: FLONASE Place 2 sprays into both nostrils as needed for allergies or rhinitis.   hydrALAZINE 50 MG tablet Commonly known as: APRESOLINE Take 50 mg by mouth 3 (three) times daily.   loratadine 10 MG tablet Commonly known as: CLARITIN Take 10 mg by mouth daily as needed for allergies.   losartan 50 MG tablet Commonly known as: COZAAR Take 50 mg by mouth daily.   magic mouthwash w/lidocaine Soln Take 5 mLs by mouth every 6 (six) hours as needed for mouth pain.   mirabegron ER 50 MG Tb24 tablet Commonly known as: MYRBETRIQ Take 1 tablet (50 mg total) by mouth daily.   multivitamin capsule Take 2 capsules by mouth daily. [employer] MVI   Premarin vaginal cream Generic drug: conjugated estrogens Estrogen Cream Instruction Discard applicator Apply pea sized amount to  tip of finger to urethra before bed. Wash hands well after application. Use Monday, Wednesday and Friday   rOPINIRole 0.25 MG tablet Commonly known as: REQUIP Take 0.5 mg by mouth at bedtime.   Vitamin D 50 MCG (2000 UT) tablet Take 4,000 Units by mouth daily.       Allergies:  Allergies  Allergen Reactions  . Chlorhexidine Hives    SAGE wipes caused severe rash  . Cyclobenzaprine Hcl     Doesn't remember reaction  . Etodolac     Doesn't remember reaction  . Lisinopril     Coughing up blood  . Meloxicam     Doesn't remember reaction  . Metaxalone  Doesn't remember reaction  . Neomycin-Bacitracin Zn-Polymyx     Skin irritation  . Nifedipine Swelling  . Nitrofurantoin     Doesn't remember reaction  . Sulfamethoxazole-Trimethoprim Hives  . Sulfonamide Derivatives Hives  . Tizanidine     Liver problems  . Vasotec [Enalapril Maleate]     Doesn't remember reaction  . Ibuprofen Rash  . Tape Rash    Family History: Family History  Problem Relation Age of Onset  . Ovarian cancer Mother   . Stroke Father        CVA  . Breast cancer Daughter 31  . Breast cancer Maternal Aunt 60  . Breast cancer Maternal Aunt 90  . Prostate cancer Neg Hx   . Bladder Cancer Neg Hx   . Kidney cancer Neg Hx     Social History:  reports that she has never smoked. She has never used smokeless tobacco. She reports that she does not drink alcohol and does not use drugs.  ROS: Pertinent ROS in HPI  Physical Exam: BP (!) 163/89   Pulse 66   Ht 5\' 5"  (1.651 m)   Wt 217 lb (98.4 kg)   BMI 36.11 kg/m   Constitutional:  Well nourished. Alert and oriented, No acute distress. HEENT: [place] AT, mask in place.  Trachea midline Cardiovascular: No clubbing, cyanosis, or edema. Respiratory: Normal respiratory effort, no increased work of breathing. Neurologic: Grossly intact, no focal deficits, moving all 4 extremities. Psychiatric: Normal mood and affect.   Laboratory Data: No new labs  since last visit  Pertinent Imaging: Results for RAYCHELL, HOLCOMB "PAT" (MRN [id number]) as of 05/04/2021 13:05  Ref. Range 05/04/2021 12:51  Scan Result Unknown 18    Assessment & Plan:    1. OAB -continue Myrbetriq to 50 mg daily-script sent to pharmacy -Explained to the patient that OAB medication really needs to have a 43-month trial to see if it is going to help reach goal or she will fail on  2. rUTI's -Asymptomatic at this visit  3.  Vaginal atrophy -Continue the vaginal estrogen cream 3 nights weekly  Return in about 3 months (around 08/04/2021) for PVR and OAB questionnaire.  These notes generated with voice recognition software. I apologize for typographical errors.  Zara Council, PA-C  [facility] [address]  [place], [place] [address] [phone]

## 2021-05-04 ENCOUNTER — Other Ambulatory Visit: Payer: Self-pay

## 2021-05-04 ENCOUNTER — Ambulatory Visit (INDEPENDENT_AMBULATORY_CARE_PROVIDER_SITE_OTHER): Payer: PPO | Admitting: Urology

## 2021-05-04 ENCOUNTER — Encounter: Payer: Self-pay | Admitting: Urology

## 2021-05-04 DIAGNOSIS — N952 Postmenopausal atrophic vaginitis: Secondary | ICD-10-CM

## 2021-05-04 DIAGNOSIS — N390 Urinary tract infection, site not specified: Secondary | ICD-10-CM

## 2021-05-04 DIAGNOSIS — N3281 Overactive bladder: Secondary | ICD-10-CM

## 2021-05-04 LAB — BLADDER SCAN AMB NON-IMAGING: Scan Result: 18

## 2021-05-04 MED ORDER — MIRABEGRON ER 50 MG PO TB24: 50.0000 mg | ORAL_TABLET | Freq: Every day | ORAL | 3 refills | Status: DC

## 2021-05-05 DIAGNOSIS — N183 Chronic kidney disease, stage 3 unspecified: Secondary | ICD-10-CM | POA: Diagnosis not present

## 2021-05-05 DIAGNOSIS — I10 Essential (primary) hypertension: Secondary | ICD-10-CM | POA: Diagnosis not present

## 2021-05-05 DIAGNOSIS — I255 Ischemic cardiomyopathy: Secondary | ICD-10-CM | POA: Diagnosis not present

## 2021-05-05 DIAGNOSIS — I739 Peripheral vascular disease, unspecified: Secondary | ICD-10-CM | POA: Diagnosis not present

## 2021-05-05 DIAGNOSIS — R0602 Shortness of breath: Secondary | ICD-10-CM | POA: Diagnosis not present

## 2021-05-05 DIAGNOSIS — I252 Old myocardial infarction: Secondary | ICD-10-CM | POA: Diagnosis not present

## 2021-05-05 DIAGNOSIS — Z8679 Personal history of other diseases of the circulatory system: Secondary | ICD-10-CM | POA: Diagnosis not present

## 2021-05-10 DIAGNOSIS — D485 Neoplasm of uncertain behavior of skin: Secondary | ICD-10-CM | POA: Diagnosis not present

## 2021-05-10 DIAGNOSIS — D2262 Melanocytic nevi of left upper limb, including shoulder: Secondary | ICD-10-CM | POA: Diagnosis not present

## 2021-05-10 DIAGNOSIS — L400 Psoriasis vulgaris: Secondary | ICD-10-CM | POA: Diagnosis not present

## 2021-05-10 DIAGNOSIS — L570 Actinic keratosis: Secondary | ICD-10-CM | POA: Diagnosis not present

## 2021-05-10 DIAGNOSIS — Z85828 Personal history of other malignant neoplasm of skin: Secondary | ICD-10-CM | POA: Diagnosis not present

## 2021-05-10 DIAGNOSIS — L821 Other seborrheic keratosis: Secondary | ICD-10-CM | POA: Diagnosis not present

## 2021-05-10 DIAGNOSIS — D2261 Melanocytic nevi of right upper limb, including shoulder: Secondary | ICD-10-CM | POA: Diagnosis not present

## 2021-05-10 DIAGNOSIS — L280 Lichen simplex chronicus: Secondary | ICD-10-CM | POA: Diagnosis not present

## 2021-05-10 DIAGNOSIS — D225 Melanocytic nevi of trunk: Secondary | ICD-10-CM | POA: Diagnosis not present

## 2021-05-10 DIAGNOSIS — D045 Carcinoma in situ of skin of trunk: Secondary | ICD-10-CM | POA: Diagnosis not present

## 2021-05-17 DIAGNOSIS — X32XXXA Exposure to sunlight, initial encounter: Secondary | ICD-10-CM | POA: Diagnosis not present

## 2021-05-17 DIAGNOSIS — D045 Carcinoma in situ of skin of trunk: Secondary | ICD-10-CM | POA: Diagnosis not present

## 2021-05-17 DIAGNOSIS — L570 Actinic keratosis: Secondary | ICD-10-CM | POA: Diagnosis not present

## 2021-07-30 DIAGNOSIS — I10 Essential (primary) hypertension: Secondary | ICD-10-CM | POA: Diagnosis not present

## 2021-07-30 DIAGNOSIS — N1831 Chronic kidney disease, stage 3a: Secondary | ICD-10-CM | POA: Diagnosis not present

## 2021-07-30 DIAGNOSIS — I252 Old myocardial infarction: Secondary | ICD-10-CM | POA: Diagnosis not present

## 2021-08-03 NOTE — Progress Notes (Signed)
08/04/2021 2:05 PM   Julie Mcbride [birth year] [id number]  Referring provider: Dion Body, MD [facility] [place],  [place] [address]  Urological history: 1. rUTI's  -contributing factors of age, vaginal atrophy, incontinence and diuretics -documented positive urine cultures over the last year  Klebsiella pneumoniae resistant to ampicillin on 02/05/2021  Pseudomonas aeruginosa pan-sensitive on 01/08/2021   2. OAB -contributing factors of age, vaginal atrophy and diuretics   -intolerable side effects with anticholinergics -managed with Myrbetriq 50 mg daily   3. Vaginal atrophy -managed with vaginal estrogen cream three nights weekly   Chief Complaint  Patient presents with   Over Active Bladder     HPI: Julie Mcbride is a 85 y.o. female who presents today for three month follow up with OAB questionnaire and PVR.  She is experiencing 8 or more daytime voids, nocturia x 3, mild urge, suffering with urge incontinence, having urinary leakage 1-2 times daily, she is always wearing incontinent pads for urinary leakage, she is wearing panty liners, she is not limiting fluid so she does not have to urinate as often as she does engage in toilet mapping.  PVR 3 mL  She states she feels that the Myrbetriq 50 mg really has not helped with her urinary symptoms.  She states she still continues to feel suprapubic pressure and urethral burning.  Patient denies any modifying or aggravating factors.  Patient denies any gross hematuria or flank pain.  Patient denies any fevers, chills, nausea or vomiting.     PMH: Past Medical History:  Diagnosis Date   Arthritis    Knees, hands   Basal cell carcinoma    back - removed   Bladder polyp    With previos hematuria   Diverticulitis    Dyspnea on exertion    Chronic   GERD (gastroesophageal reflux disease)    History of ETT    Myoview 7/10 EF 78%, no ischemia or infarction. Poor exercise  tolerance (2'30", stopped due to fatigue). Reached 86% MPHR.   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 Ulice Dash   BACK SURGERY     BASAL CELL CARCINOMA EXCISION     back   BREAST BIOPSY Right 06/19/2017   Affirm Bx- Radial Scar   BREAST EXCISIONAL BIOPSY Right 1996   neg   BREAST LUMPECTOMY WITH NEEDLE LOCALIZATION Right 08/11/2017   Procedure: BREAST LUMPECTOMY WITH NEEDLE LOCALIZATION;  Surgeon: Leonie Green, MD;  Location: [facility];  Service: General;  Laterality: Right;   CARPAL TUNNEL RELEASE Right    CATARACT EXTRACTION W/ INTRAOCULAR LENS  IMPLANT, BILATERAL     EXCISION OF BREAST BIOPSY Right 08/11/2017   excision of radial Scar   EYE SURGERY     JOINT REPLACEMENT Right    knee   KNEE ARTHROSCOPY Right 04/22/2016   Procedure: ARTHROSCOPY KNEE;  Surgeon: Leanor Kail, MD;  Location: [place];  Service: Orthopedics;  Laterality: Right;   knee replacement Right 01/11/2017   NASAL SINUS SURGERY  06/05/14  Dr. Pryor Ochoa   POSTERIOR CERVICAL LAMINECTOMY  2005   C5-7; plate and screws   RENAL ANGIOGRAPHY Left 04/20/2020   Procedure: RENAL ANGIOGRAPHY;  Surgeon: Algernon Huxley, MD;  Location: [place] [facility];  Service: Cardiovascular;  Laterality: Left;   TONSILLECTOMY      Home Medications:  Allergies as of 08/04/2021       Reactions   Chlorhexidine Hives   SAGE wipes caused severe rash   Cyclobenzaprine Hcl    Doesn't remember reaction   Etodolac    Doesn't remember reaction   Lisinopril    Coughing up blood   Meloxicam    Doesn't remember reaction   Metaxalone    Doesn't remember reaction    Neomycin-bacitracin Zn-polymyx    Skin irritation   Nifedipine Swelling   Nitrofurantoin    Doesn't remember reaction   Sulfamethoxazole-trimethoprim Hives   Sulfonamide Derivatives Hives   Tizanidine    Liver problems   Vasotec [enalapril Maleate]    Doesn't remember reaction   Ibuprofen Rash   Tape Rash        Medication List        Accurate as of August 04, 2021 11:59 PM. If you have any questions, ask your nurse or doctor.          atorvastatin 10 MG tablet Commonly known as: Lipitor Take 1 tablet (10 mg total) by mouth daily.   bisoprolol-hydrochlorothiazide 10-6.25 MG tablet Commonly known as: ZIAC Take 1 tablet by mouth daily.   CALCIUM CARBONATE PO Take 300 mg by mouth.   clobetasol ointment 0.05 % Commonly known as: TEMOVATE Apply 1 application topically 2 (two) times daily as needed for rash.   clopidogrel 75 MG tablet Commonly known as: Plavix Take 1 tablet (75 mg total) by mouth daily.   fluticasone 50 MCG/ACT nasal spray Commonly known as: FLONASE Place 2 sprays into both nostrils as needed for allergies or rhinitis.   hydrALAZINE 50 MG tablet Commonly known as: APRESOLINE Take 50 mg by mouth 3 (three) times daily.   loratadine 10 MG tablet Commonly known as: CLARITIN Take 10 mg by mouth daily as needed for allergies.   losartan 50 MG tablet Commonly known as: COZAAR Take 50 mg by mouth daily.   losartan 100 MG tablet Commonly known as: COZAAR Take 100 mg by mouth daily.   magic mouthwash w/lidocaine Soln Take 5 mLs by mouth every 6 (six) hours as needed for mouth pain.   mirabegron ER 50 MG Tb24 tablet Commonly known as: MYRBETRIQ Take 1 tablet (50 mg total) by mouth daily.   multivitamin capsule Take 2 capsules by mouth daily. GNC MVI   Premarin vaginal cream Generic drug: conjugated estrogens Estrogen Cream Instruction Discard applicator Apply pea sized amount to tip of finger to urethra before bed. Wash hands well after  application. Use Monday, Wednesday and Friday   rOPINIRole 0.25 MG tablet Commonly known as: REQUIP Take 0.5 mg by mouth at bedtime.   Vitamin D 50 MCG (2000 UT) tablet Take 4,000 Units by mouth daily.        Allergies:  Allergies  Allergen Reactions   Chlorhexidine Hives    SAGE wipes caused severe rash   Cyclobenzaprine Hcl     Doesn't remember reaction   Etodolac     Doesn't remember reaction   Lisinopril     Coughing up blood   Meloxicam     Doesn't remember reaction   Metaxalone     Doesn't remember  reaction   Neomycin-Bacitracin Zn-Polymyx     Skin irritation   Nifedipine Swelling   Nitrofurantoin     Doesn't remember reaction   Sulfamethoxazole-Trimethoprim Hives   Sulfonamide Derivatives Hives   Tizanidine     Liver problems   Vasotec [Enalapril Maleate]     Doesn't remember reaction   Ibuprofen Rash   Tape Rash    Family History: Family History  Problem Relation Age of Onset   Ovarian cancer Mother    Stroke Father        CVA   Breast cancer Daughter 3   Breast cancer Maternal Aunt 16   Breast cancer Maternal Aunt 90   Prostate cancer Neg Hx    Bladder Cancer Neg Hx    Kidney cancer Neg Hx     Social History:  reports that she has never smoked. She has never used smokeless tobacco. She reports that she does not drink alcohol and does not use drugs.  ROS: Pertinent ROS in HPI  Physical Exam: BP (!) 169/80   Pulse 67   Ht $R'5\' 6"'gi$  (1.676 m)   Wt 219 lb (99.3 kg)   BMI 35.35 kg/m   Constitutional:  Well nourished. Alert and oriented, No acute distress. HEENT: [place] AT, mask in place.  Trachea midline Cardiovascular: No clubbing, cyanosis, or edema. Respiratory: Normal respiratory effort, no increased work of breathing. Neurologic: Grossly intact, no focal deficits, moving all 4 extremities. Psychiatric: Normal mood and affect.   Laboratory Data: WBC (White Blood Cell Count) 4.1 - 10.2 10^3/uL 8.0   RBC (Red Blood Cell Count) 4.04 - 5.48  10^6/uL 4.33   Hemoglobin 12.0 - 15.0 gm/dL 13.2   Hematocrit 35.0 - 47.0 % 39.6   MCV (Mean Corpuscular Volume) 80.0 - 100.0 fl 91.5   MCH (Mean Corpuscular Hemoglobin) 27.0 - 31.2 pg 30.5   MCHC (Mean Corpuscular Hemoglobin Concentration) 32.0 - 36.0 gm/dL 33.3   Platelet Count 150 - 450 10^3/uL 200   RDW-CV (Red Cell Distribution Width) 11.6 - 14.8 % 13.3   MPV (Mean Platelet Volume) 9.4 - 12.4 fl 12.5 High    Neutrophils 1.50 - 7.80 10^3/uL 4.67   Lymphocytes 1.00 - 3.60 10^3/uL 2.20   Monocytes 0.00 - 1.50 10^3/uL 0.85   Eosinophils 0.00 - 0.55 10^3/uL 0.21   Basophils 0.00 - 0.09 10^3/uL 0.04   Neutrophil % 32.0 - 70.0 % 58.5   Lymphocyte % 10.0 - 50.0 % 27.6   Monocyte % 4.0 - 13.0 % 10.7   Eosinophil % 1.0 - 5.0 % 2.6   Basophil% 0.0 - 2.0 % 0.5   Immature Granulocyte % <=0.7 % 0.1   Immature Granulocyte Count <=0.06 10^3/L 0.01   Resulting Agency  [facility]  Specimen Collected: 07/30/21 07:09 Last Resulted: 07/30/21 08:08  Received From: [facility]  Result Received: 08/03/21 22:37   Glucose 70 - 110 mg/dL 100   Sodium 136 - 145 mmol/L 142   Potassium 3.6 - 5.1 mmol/L 4.1   Chloride 97 - 109 mmol/L 105   Carbon Dioxide (CO2) 22.0 - 32.0 mmol/L 29.5   Urea Nitrogen (BUN) 7 - 25 mg/dL 28 High    Creatinine 0.6 - 1.1 mg/dL 1.1   Glomerular Filtration Rate (eGFR), MDRD Estimate >60 mL/min/1.73sq m 48 Low    Calcium 8.7 - 10.3 mg/dL 9.7   AST  8 - 39 U/L 19   ALT  5 - 38 U/L 17   Alk Phos (  alkaline Phosphatase) 34 - 104 U/L 41   Albumin 3.5 - 4.8 g/dL 3.9   Bilirubin, Total 0.3 - 1.2 mg/dL 0.6   Protein, Total 6.1 - 7.9 g/dL 5.9 Low    A/G Ratio 1.0 - 5.0 gm/dL 2.0   Resulting Agency  [facility]  Specimen Collected: 07/30/21 07:09 Last Resulted: 07/30/21 09:43  Received From: [facility]  Result Received: 08/03/21 22:37  I have reviewed the labs.   Pertinent Imaging: Results for MARLENE, BEIDLER  "PAT" (MRN [id number]) as of 08/04/2021 13:41  Ref. Range 08/04/2021 13:16  Scan Result Unknown 43mL     Assessment & Plan:    1. OAB -take the Myrbetriq 50 mg later in the evening -schedule cysto for refractory urgency  -I have explained to the patient that they will  be scheduled for a cystoscopy in our office to evaluate their bladder.  The cystoscopy consists of passing a tube with a lens up through their urethra and into their urinary bladder.   We will inject the urethra with a lidocaine gel prior to introducing the cystoscope to help with any discomfort during the procedure.   After the procedure, they might experience blood in the urine and discomfort with urination.  This will abate after the first few voids.  I have  encouraged the patient to increase water intake  during this time.  Patient denies any allergies to lidocaine.   -schedule cysto  2. rUTI's -Asymptomatic at this visit  3.  Vaginal atrophy -Continue the vaginal estrogen cream 3 nights weekly  Return for cysto for refractory urgency with Dr. Erlene Quan .  These notes generated with voice recognition software. I apologize for typographical errors.  Zara Council, PA-C  [facility] [address]  [place], [place] [address] [phone]

## 2021-08-04 ENCOUNTER — Ambulatory Visit: Payer: PPO | Admitting: Urology

## 2021-08-04 ENCOUNTER — Other Ambulatory Visit: Payer: Self-pay

## 2021-08-04 ENCOUNTER — Encounter: Payer: Self-pay | Admitting: Urology

## 2021-08-04 VITALS — BP 169/80 | HR 67 | Ht 66.0 in | Wt 219.0 lb

## 2021-08-04 DIAGNOSIS — N3281 Overactive bladder: Secondary | ICD-10-CM

## 2021-08-04 DIAGNOSIS — N390 Urinary tract infection, site not specified: Secondary | ICD-10-CM | POA: Diagnosis not present

## 2021-08-04 DIAGNOSIS — N952 Postmenopausal atrophic vaginitis: Secondary | ICD-10-CM

## 2021-08-04 LAB — BLADDER SCAN AMB NON-IMAGING

## 2021-08-06 DIAGNOSIS — I10 Essential (primary) hypertension: Secondary | ICD-10-CM | POA: Diagnosis not present

## 2021-08-06 DIAGNOSIS — Z0000 Encounter for general adult medical examination without abnormal findings: Secondary | ICD-10-CM | POA: Diagnosis not present

## 2021-08-06 DIAGNOSIS — I252 Old myocardial infarction: Secondary | ICD-10-CM | POA: Diagnosis not present

## 2021-08-06 DIAGNOSIS — N1831 Chronic kidney disease, stage 3a: Secondary | ICD-10-CM | POA: Diagnosis not present

## 2021-08-06 DIAGNOSIS — E6609 Other obesity due to excess calories: Secondary | ICD-10-CM | POA: Diagnosis not present

## 2021-08-17 NOTE — Progress Notes (Signed)
   08/20/21  CC:  Chief Complaint  Patient presents with   Cysto     HPI: Julie Mcbride is a 85 y.o.female with a personal history of recurrent UTIs, OAB, and vaginal atrophy, who presents today for a cystoscopy for refractory urgency.   She was seen on 08/04/2021 by Zara Council, PA-C, where she was reported to be experiencing 8 or more daytime voids, nocturia x3, mild urge, suffering with urge incontinence, having urinary leakage 1-2 times daily. She was also reported to wearing incontinent pads.   She is doing well today.    Vitals:   08/18/21 1435  BP: (!) 172/74  Pulse: 69  NED. A&Ox3.   No respiratory distress   Abd soft, NT, ND External genitalia demonstrates a 3 cm what appears to be lipoma on the left labia majora, otherwise unremarkable  Cystoscopy Procedure Note  Patient identification was confirmed, informed consent was obtained, and patient was prepped using Betadine solution.  Lidocaine jelly was administered per urethral meatus.    Procedure: - Flexible cystoscope introduced, without any difficulty.   - Thorough search of the bladder revealed:    normal urethral meatus    normal urothelium    no stones    no ulcers     no tumors    no urethral polyps    no trabeculation   - Ureteral orifices were normal in position and appearance.  Post-Procedure: - Patient tolerated the procedure well  Assessment/ Plan:  OAB  - Cystoscopy unremarkable today  - Stop Myrbetriq and start Gemtesa (samples x 1 month given) - If Gemtesa fails to improve urinary  symptoms recommend having a conversation with Zara Council, PA-C, about second line treatment options including PTNS and Botox.   2. Vaginal dryness - Continue topical vaginal estrogen cream.    Return in about 1 month (around 09/17/2021).  Conley Rolls as a Education administrator for Hollice Espy, MD.,have documented all relevant documentation on the behalf of Hollice Espy, MD,as directed by   Hollice Espy, MD while in the presence of Hollice Espy, MD.  I have reviewed the above documentation for accuracy and completeness, and I agree with the above.   Hollice Espy, MD

## 2021-08-18 ENCOUNTER — Ambulatory Visit: Payer: PPO | Admitting: Urology

## 2021-08-18 ENCOUNTER — Encounter: Payer: Self-pay | Admitting: Urology

## 2021-08-18 ENCOUNTER — Other Ambulatory Visit: Payer: Self-pay

## 2021-08-18 VITALS — BP 172/74 | HR 69 | Ht 66.0 in | Wt 219.0 lb

## 2021-08-18 DIAGNOSIS — N390 Urinary tract infection, site not specified: Secondary | ICD-10-CM | POA: Diagnosis not present

## 2021-08-18 MED ORDER — GEMTESA 75 MG PO TABS: 75.0000 mg | ORAL_TABLET | Freq: Every day | ORAL | 0 refills | Status: DC

## 2021-08-19 LAB — MICROSCOPIC EXAMINATION: Bacteria, UA: NONE SEEN

## 2021-08-19 LAB — URINALYSIS, COMPLETE
Bilirubin, UA: NEGATIVE
Glucose, UA: NEGATIVE
Ketones, UA: NEGATIVE
Nitrite, UA: NEGATIVE
Protein,UA: NEGATIVE
RBC, UA: NEGATIVE
Specific Gravity, UA: 1.02 (ref 1.005–1.030)
Urobilinogen, Ur: 0.2 mg/dL (ref 0.2–1.0)
pH, UA: 6.5 (ref 5.0–7.5)

## 2021-09-16 ENCOUNTER — Ambulatory Visit: Payer: PPO | Admitting: Urology

## 2021-09-24 DIAGNOSIS — C44622 Squamous cell carcinoma of skin of right upper limb, including shoulder: Secondary | ICD-10-CM | POA: Diagnosis not present

## 2021-09-24 DIAGNOSIS — Z08 Encounter for follow-up examination after completed treatment for malignant neoplasm: Secondary | ICD-10-CM | POA: Diagnosis not present

## 2021-09-24 DIAGNOSIS — Z872 Personal history of diseases of the skin and subcutaneous tissue: Secondary | ICD-10-CM | POA: Diagnosis not present

## 2021-09-24 DIAGNOSIS — L821 Other seborrheic keratosis: Secondary | ICD-10-CM | POA: Diagnosis not present

## 2021-09-24 DIAGNOSIS — Z09 Encounter for follow-up examination after completed treatment for conditions other than malignant neoplasm: Secondary | ICD-10-CM | POA: Diagnosis not present

## 2021-09-24 DIAGNOSIS — D485 Neoplasm of uncertain behavior of skin: Secondary | ICD-10-CM | POA: Diagnosis not present

## 2021-09-24 DIAGNOSIS — Z85828 Personal history of other malignant neoplasm of skin: Secondary | ICD-10-CM | POA: Diagnosis not present

## 2021-09-24 DIAGNOSIS — C44529 Squamous cell carcinoma of skin of other part of trunk: Secondary | ICD-10-CM | POA: Diagnosis not present

## 2021-09-24 DIAGNOSIS — L400 Psoriasis vulgaris: Secondary | ICD-10-CM | POA: Diagnosis not present

## 2021-09-27 ENCOUNTER — Emergency Department: Admission: EM | Admit: 2021-09-27 | Discharge: 2021-09-27 | Discharge status: Against Medical Advice | Payer: PPO

## 2021-09-27 DIAGNOSIS — J22 Unspecified acute lower respiratory infection: Secondary | ICD-10-CM | POA: Diagnosis not present

## 2021-09-28 DIAGNOSIS — R062 Wheezing: Secondary | ICD-10-CM | POA: Diagnosis not present

## 2021-09-28 DIAGNOSIS — J209 Acute bronchitis, unspecified: Secondary | ICD-10-CM | POA: Diagnosis not present

## 2021-10-05 DIAGNOSIS — J22 Unspecified acute lower respiratory infection: Secondary | ICD-10-CM | POA: Diagnosis not present

## 2021-10-26 DIAGNOSIS — L905 Scar conditions and fibrosis of skin: Secondary | ICD-10-CM | POA: Diagnosis not present

## 2021-10-26 DIAGNOSIS — C44622 Squamous cell carcinoma of skin of right upper limb, including shoulder: Secondary | ICD-10-CM | POA: Diagnosis not present

## 2021-11-10 DIAGNOSIS — Z8679 Personal history of other diseases of the circulatory system: Secondary | ICD-10-CM | POA: Diagnosis not present

## 2021-11-10 DIAGNOSIS — I10 Essential (primary) hypertension: Secondary | ICD-10-CM | POA: Diagnosis not present

## 2021-11-10 DIAGNOSIS — I255 Ischemic cardiomyopathy: Secondary | ICD-10-CM | POA: Diagnosis not present

## 2021-11-10 DIAGNOSIS — R0602 Shortness of breath: Secondary | ICD-10-CM | POA: Diagnosis not present

## 2021-11-10 DIAGNOSIS — N183 Chronic kidney disease, stage 3 unspecified: Secondary | ICD-10-CM | POA: Diagnosis not present

## 2021-11-10 DIAGNOSIS — E6609 Other obesity due to excess calories: Secondary | ICD-10-CM | POA: Diagnosis not present

## 2021-11-10 DIAGNOSIS — I739 Peripheral vascular disease, unspecified: Secondary | ICD-10-CM | POA: Diagnosis not present

## 2021-11-10 DIAGNOSIS — I252 Old myocardial infarction: Secondary | ICD-10-CM | POA: Diagnosis not present

## 2021-11-24 DIAGNOSIS — C44529 Squamous cell carcinoma of skin of other part of trunk: Secondary | ICD-10-CM | POA: Diagnosis not present

## 2021-11-24 DIAGNOSIS — L905 Scar conditions and fibrosis of skin: Secondary | ICD-10-CM | POA: Diagnosis not present

## 2021-12-09 DIAGNOSIS — Z85828 Personal history of other malignant neoplasm of skin: Secondary | ICD-10-CM | POA: Diagnosis not present

## 2021-12-09 DIAGNOSIS — E785 Hyperlipidemia, unspecified: Secondary | ICD-10-CM | POA: Diagnosis not present

## 2021-12-09 DIAGNOSIS — I739 Peripheral vascular disease, unspecified: Secondary | ICD-10-CM | POA: Diagnosis not present

## 2021-12-09 DIAGNOSIS — N1831 Chronic kidney disease, stage 3a: Secondary | ICD-10-CM | POA: Diagnosis not present

## 2021-12-09 DIAGNOSIS — Z7902 Long term (current) use of antithrombotics/antiplatelets: Secondary | ICD-10-CM | POA: Diagnosis not present

## 2021-12-09 DIAGNOSIS — I255 Ischemic cardiomyopathy: Secondary | ICD-10-CM | POA: Diagnosis not present

## 2021-12-09 DIAGNOSIS — I129 Hypertensive chronic kidney disease with stage 1 through stage 4 chronic kidney disease, or unspecified chronic kidney disease: Secondary | ICD-10-CM | POA: Diagnosis not present

## 2021-12-09 DIAGNOSIS — Z6835 Body mass index (BMI) 35.0-35.9, adult: Secondary | ICD-10-CM | POA: Diagnosis not present

## 2021-12-13 DIAGNOSIS — H26493 Other secondary cataract, bilateral: Secondary | ICD-10-CM | POA: Diagnosis not present

## 2021-12-13 NOTE — Progress Notes (Signed)
12/14/2021 11:39 AM   Julie Mcbride 1936-10-08 [id number]  Referring provider: Dion Body, MD [facility] [place],  [place] [address]   Chief Complaint  Patient presents with   Over Active Bladder    Urological history: 1. rUTI's  -contributing factors of age, vaginal atrophy, incontinence and diuretics -documented positive urine cultures over the last year  Klebsiella pneumoniae resistant to ampicillin on 02/05/2021  Pseudomonas aeruginosa pan-sensitive on 01/08/2021   2. OAB -contributing factors of age, vaginal atrophy and diuretics   -cysto 2022-NED -intolerable side effects with anticholinergics -failed Myrbetriq 50 mg daily   3. Vaginal atrophy -managed with vaginal estrogen cream three nights weekly     HPI: Julie Mcbride is a 86 y.o. female who presents today for follow up.  She is following up after undergoing cystoscopy for refractory OAB.  Cystoscopy was performed on August 18, 2021 and was NED.  She is given Gemtesa samples at that appointment and follows up today.  PVR 0 mL  She is experiencing 1-7 daytime urinations, 1-2 nighttime urinations with a mild urge to urinate.  She is having 1-2 daily episodes of urinary leakage.  She wears a panty liner at this time.   Patient denies any modifying or aggravating factors.  Patient denies any gross hematuria, dysuria or suprapubic/flank pain.  Patient denies any fevers, chills, nausea or vomiting.    She is also using the vaginal estrogen cream three nights weekly.   PMH: Past Medical History:  Diagnosis Date   Arthritis    Knees, hands   Basal cell carcinoma    back - removed   Bladder polyp    With previos hematuria   Diverticulitis    Dyspnea on exertion    Chronic   GERD (gastroesophageal reflux disease)    History of ETT    Myoview 7/10 EF 78%, no ischemia or infarction. Poor exercise tolerance (2'30", stopped due to fatigue). Reached 86% MPHR.    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 Ulice Dash   BACK SURGERY     BASAL CELL CARCINOMA EXCISION     back   BREAST BIOPSY Right 06/19/2017   Affirm Bx- Radial Scar   BREAST EXCISIONAL BIOPSY Right 1996   neg   BREAST LUMPECTOMY WITH NEEDLE LOCALIZATION Right 08/11/2017   Procedure: BREAST LUMPECTOMY WITH NEEDLE LOCALIZATION;  Surgeon: Leonie Green, MD;  Location: [facility];  Service: General;  Laterality: Right;   CARPAL TUNNEL RELEASE Right    CATARACT EXTRACTION W/ INTRAOCULAR LENS  IMPLANT, BILATERAL     EXCISION OF BREAST BIOPSY Right 08/11/2017   excision of radial Scar   EYE SURGERY     JOINT REPLACEMENT Right    knee   KNEE ARTHROSCOPY Right 04/22/2016   Procedure: ARTHROSCOPY KNEE;  Surgeon: Leanor Kail, MD;  Location: [facility];  Service: Orthopedics;  Laterality: Right;   knee replacement Right 01/11/2017   NASAL SINUS SURGERY  06/05/14   Dr. Pryor Ochoa   POSTERIOR CERVICAL LAMINECTOMY  2005  C5-7; plate and screws   RENAL ANGIOGRAPHY Left 04/20/2020   Procedure: RENAL ANGIOGRAPHY;  Surgeon: Algernon Huxley, MD;  Location: [facility];  Service: Cardiovascular;  Laterality: Left;   TONSILLECTOMY      Home Medications:  Allergies as of 12/14/2021       Reactions   Chlorhexidine Hives   SAGE wipes caused severe rash   Cyclobenzaprine Hcl    Doesn't remember reaction   Etodolac    Doesn't remember reaction   Lisinopril    Coughing up blood   Meloxicam    Doesn't remember reaction   Metaxalone    Doesn't remember reaction   Neomycin-bacitracin Zn-polymyx    Skin irritation   Nifedipine  Swelling   Nitrofurantoin    Doesn't remember reaction   Sulfamethoxazole-trimethoprim Hives   Sulfonamide Derivatives Hives   Tizanidine    Liver problems   Vasotec [enalapril Maleate]    Doesn't remember reaction   Ibuprofen Rash   Tape Rash        Medication List        Accurate as of December 14, 2021 11:39 AM. If you have any questions, ask your nurse or doctor.          atorvastatin 10 MG tablet Commonly known as: Lipitor Take 1 tablet (10 mg total) by mouth daily.   bisoprolol-hydrochlorothiazide 10-6.25 MG tablet Commonly known as: ZIAC Take 1 tablet by mouth daily.   CALCIUM CARBONATE PO Take 300 mg by mouth.   clobetasol ointment 0.05 % Commonly known as: TEMOVATE Apply 1 application topically 2 (two) times daily as needed for rash.   clopidogrel 75 MG tablet Commonly known as: Plavix Take 1 tablet (75 mg total) by mouth daily.   fluticasone 50 MCG/ACT nasal spray Commonly known as: FLONASE Place 2 sprays into both nostrils as needed for allergies or rhinitis.   Gemtesa 75 MG Tabs Generic drug: Vibegron Take 75 mg by mouth daily.   hydrALAZINE 50 MG tablet Commonly known as: APRESOLINE Take 50 mg by mouth 3 (three) times daily.   loratadine 10 MG tablet Commonly known as: CLARITIN Take 10 mg by mouth daily as needed for allergies.   losartan 50 MG tablet Commonly known as: COZAAR Take 50 mg by mouth daily.   losartan 100 MG tablet Commonly known as: COZAAR Take 100 mg by mouth daily.   magic mouthwash w/lidocaine Soln Take 5 mLs by mouth every 6 (six) hours as needed for mouth pain.   multivitamin capsule Take 2 capsules by mouth daily. GNC MVI   Premarin vaginal cream Generic drug: conjugated estrogens Estrogen Cream Instruction Discard applicator Apply pea sized amount to tip of finger to urethra before bed. Wash hands well after application. Use Monday, Wednesday and Friday   rOPINIRole 0.25 MG tablet Commonly known as:  REQUIP Take 0.5 mg by mouth at bedtime.   Vitamin D 50 MCG (2000 UT) tablet Take 4,000 Units by mouth daily.        Allergies:  Allergies  Allergen Reactions   Chlorhexidine Hives    SAGE wipes caused severe rash   Cyclobenzaprine Hcl     Doesn't remember reaction   Etodolac     Doesn't remember reaction   Lisinopril     Coughing up blood   Meloxicam     Doesn't remember reaction   Metaxalone     Doesn't remember reaction   Neomycin-Bacitracin Zn-Polymyx     Skin irritation   Nifedipine Swelling  Nitrofurantoin     Doesn't remember reaction   Sulfamethoxazole-Trimethoprim Hives   Sulfonamide Derivatives Hives   Tizanidine     Liver problems   Vasotec [Enalapril Maleate]     Doesn't remember reaction   Ibuprofen Rash   Tape Rash    Family History: Family History  Problem Relation Age of Onset   Ovarian cancer Mother    Stroke Father        CVA   Breast cancer Daughter 35   Breast cancer Maternal Aunt 79   Breast cancer Maternal Aunt 90   Prostate cancer Neg Hx    Bladder Cancer Neg Hx    Kidney cancer Neg Hx     Social History:  reports that she has never smoked. She has never used smokeless tobacco. She reports that she does not drink alcohol and does not use drugs.  ROS: Pertinent ROS in HPI  Physical Exam: BP (!) 157/76    Pulse 65   Constitutional:  Well nourished. Alert and oriented, No acute distress. HEENT: [place] AT, mask in place.  Trachea midline Cardiovascular: No clubbing, cyanosis, or edema. Respiratory: Normal respiratory effort, no increased work of breathing. Neurologic: Grossly intact, no focal deficits, moving all 4 extremities. Psychiatric: Normal mood and affect.    Laboratory Data: N/A  Pertinent Imaging:  12/14/21 11:27  Scan Result 63mL    Assessment & Plan:    1. OAB -did not find the Gemtesa effective  -at goal with Myrbetriq 50 mg daily  -continue the Myrbetriq 50 mg daily  2. rUTI's -Asymptomatic at this  visit  3.  Vaginal atrophy -Continue the vaginal estrogen cream 3 nights weekly  Return in about 1 year (around 12/14/2022) for PVR and OAB questionnaire.  These notes generated with voice recognition software. I apologize for typographical errors.  Zara Council, PA-C  [facility] [address]  [place], [place] [address] [phone]

## 2021-12-14 ENCOUNTER — Encounter: Payer: Self-pay | Admitting: Urology

## 2021-12-14 ENCOUNTER — Other Ambulatory Visit: Payer: Self-pay

## 2021-12-14 ENCOUNTER — Ambulatory Visit: Payer: PPO | Admitting: Urology

## 2021-12-14 VITALS — BP 157/76 | HR 65

## 2021-12-14 DIAGNOSIS — N3281 Overactive bladder: Secondary | ICD-10-CM

## 2021-12-14 DIAGNOSIS — N390 Urinary tract infection, site not specified: Secondary | ICD-10-CM

## 2021-12-14 DIAGNOSIS — N952 Postmenopausal atrophic vaginitis: Secondary | ICD-10-CM

## 2021-12-14 LAB — BLADDER SCAN AMB NON-IMAGING

## 2022-01-28 DIAGNOSIS — I252 Old myocardial infarction: Secondary | ICD-10-CM | POA: Diagnosis not present

## 2022-02-04 DIAGNOSIS — N1831 Chronic kidney disease, stage 3a: Secondary | ICD-10-CM | POA: Diagnosis not present

## 2022-02-04 DIAGNOSIS — Z8679 Personal history of other diseases of the circulatory system: Secondary | ICD-10-CM | POA: Diagnosis not present

## 2022-02-04 DIAGNOSIS — I252 Old myocardial infarction: Secondary | ICD-10-CM | POA: Diagnosis not present

## 2022-02-04 DIAGNOSIS — I10 Essential (primary) hypertension: Secondary | ICD-10-CM | POA: Diagnosis not present

## 2022-02-04 DIAGNOSIS — I739 Peripheral vascular disease, unspecified: Secondary | ICD-10-CM | POA: Diagnosis not present

## 2022-02-04 DIAGNOSIS — I255 Ischemic cardiomyopathy: Secondary | ICD-10-CM | POA: Diagnosis not present

## 2022-02-04 DIAGNOSIS — E6609 Other obesity due to excess calories: Secondary | ICD-10-CM | POA: Diagnosis not present

## 2022-02-15 ENCOUNTER — Ambulatory Visit (INDEPENDENT_AMBULATORY_CARE_PROVIDER_SITE_OTHER): Payer: PPO

## 2022-02-15 ENCOUNTER — Ambulatory Visit (INDEPENDENT_AMBULATORY_CARE_PROVIDER_SITE_OTHER): Payer: PPO | Admitting: Vascular Surgery

## 2022-02-15 ENCOUNTER — Other Ambulatory Visit: Payer: Self-pay

## 2022-02-15 ENCOUNTER — Encounter (INDEPENDENT_AMBULATORY_CARE_PROVIDER_SITE_OTHER): Payer: Self-pay | Admitting: Vascular Surgery

## 2022-02-15 VITALS — BP 185/84 | HR 72 | Resp 16 | Wt 224.4 lb

## 2022-02-15 DIAGNOSIS — I701 Atherosclerosis of renal artery: Secondary | ICD-10-CM | POA: Diagnosis not present

## 2022-02-15 DIAGNOSIS — I150 Renovascular hypertension: Secondary | ICD-10-CM

## 2022-02-15 DIAGNOSIS — N183 Chronic kidney disease, stage 3 unspecified: Secondary | ICD-10-CM

## 2022-02-15 NOTE — Assessment & Plan Note (Signed)
Duplex today shows no significant right renal artery stenosis and a patent stent in the left renal artery.  Kidney lengths are equal.  She is almost 2 years status post left renal artery stent placement.  She still has some elevated blood pressure and her GFR at last check was 39, but perfusion is no longer an issue.  We will continue to follow this on an annual basis. ?

## 2022-02-15 NOTE — Progress Notes (Signed)
? ? ?MRN : [id number] ? ?Julie Mcbride is a 86 y.o. (11-10-1936) female who presents with chief complaint of  ?Chief Complaint  ?Patient presents with  ? Follow-up  ?  Ultrasound follow up  ?. ? ?History of Present Illness: Patient returns today in follow up of her renal artery stenosis.  She is almost 2 years status post left renal artery stent placement for renovascular hypertension with high-grade left renal artery stenosis and chronic kidney disease.  Her last GFR was 39.  Her blood pressure control is good but not optimal.  She says she generally runs in the 299 systolic.  No new complaints today. ? ?Current Outpatient Medications  ?Medication Sig Dispense Refill  ? atorvastatin (LIPITOR) 10 MG tablet Take 1 tablet (10 mg total) by mouth daily. 30 tablet 11  ? bisoprolol-hydrochlorothiazide (ZIAC) 10-6.25 MG per tablet Take 1 tablet by mouth daily.    ? Calcium Carbonate Antacid (CALCIUM CARBONATE PO) Take 300 mg by mouth.    ? cetirizine (ZYRTEC) 10 MG tablet Take 10 mg by mouth daily.    ? Cholecalciferol (VITAMIN D) 50 MCG (2000 UT) tablet Take 4,000 Units by mouth daily.     ? cloNIDine (CATAPRES) 0.1 MG tablet Take 0.1 mg by mouth 2 (two) times daily as needed.    ? clopidogrel (PLAVIX) 75 MG tablet Take 1 tablet (75 mg total) by mouth daily. 30 tablet 11  ? conjugated estrogens (PREMARIN) vaginal cream Estrogen Cream Instruction Discard applicator Apply pea sized amount to tip of finger to urethra before bed. Wash hands well after application. Use Monday, Wednesday and Friday 42.5 g 11  ? Cranberry 500 MG CAPS Take by mouth.    ? fluticasone (FLONASE) 50 MCG/ACT nasal spray Place 2 sprays into both nostrils as needed for allergies or rhinitis.    ? hydrALAZINE (APRESOLINE) 50 MG tablet Take 50 mg by mouth 3 (three) times daily.     ? ipratropium-albuterol (DUONEB) 0.5-2.5 (3) MG/3ML SOLN SMARTSIG:1 Vial(s) Via Nebulizer 4 Times Daily PRN    ? losartan (COZAAR) 100 MG tablet Take 100 mg by mouth daily.     ? Multiple Vitamin (MULTIVITAMIN) capsule Take 2 capsules by mouth daily. [place] MVI    ? MYRBETRIQ 50 MG TB24 tablet Take 50 mg by mouth daily.    ? rOPINIRole (REQUIP) 0.25 MG tablet Take 0.5 mg by mouth at bedtime.    ? Spacer/Aero-Holding Chambers (EASIVENT) inhaler See admin instructions.    ? triamcinolone ointment (KENALOG) 0.1 % Apply topically 2 (two) times daily as needed.    ? UNABLE TO FIND Herbal Supplement    ? clobetasol ointment (TEMOVATE) 2.42 % Apply 1 application topically 2 (two) times daily as needed for rash. (Patient not taking: Reported on 02/15/2022)    ? loratadine (CLARITIN) 10 MG tablet Take 10 mg by mouth daily as needed for allergies.  (Patient not taking: Reported on 02/15/2022)    ? losartan (COZAAR) 50 MG tablet Take 50 mg by mouth daily. (Patient not taking: Reported on 02/15/2022)    ? magic mouthwash w/lidocaine SOLN Take 5 mLs by mouth every 6 (six) hours as needed for mouth pain. (Patient not taking: Reported on 02/15/2022)    ? Vibegron (GEMTESA) 75 MG TABS Take 75 mg by mouth daily. (Patient not taking: Reported on 02/15/2022) 30 tablet 0  ? ?No current facility-administered medications for this visit.  ? ? ?Past Medical History:  ?Diagnosis Date  ? Arthritis   ? Knees,  hands  ? Basal cell carcinoma   ? back - removed  ? Bladder polyp   ? With previos hematuria  ? Diverticulitis   ? Dyspnea on exertion   ? Chronic  ? GERD (gastroesophageal reflux disease)   ? History of ETT   ? Myoview 7/10 EF 78%, no ischemia or infarction. Poor exercise tolerance (2'30", stopped due to fatigue). Reached 86% MPHR.  ? Hypertension   ? Kidney stones   ? Multiple allergies   ? Chronic  ? Palpitations   ? Holter 7/10 with rare PACs  ? Postmenopausal   ? S/P hysterectomy   ? S/P laminectomy   ? 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 ? ?Past Surgical History:  ?Procedure Laterality Date  ? ABDOMINAL HYSTERECTOMY    ? ANKLE FRACTURE SURGERY Right 9/14  ? Dr. Ulice Dash  ? BACK SURGERY    ? BASAL CELL CARCINOMA EXCISION    ? back  ? BREAST BIOPSY Right 06/19/2017  ? Affirm Bx- Radial Scar  ? BREAST EXCISIONAL BIOPSY Right 1996  ? neg  ? BREAST LUMPECTOMY WITH NEEDLE LOCALIZATION Right 08/11/2017  ? Procedure: BREAST LUMPECTOMY WITH NEEDLE LOCALIZATION;  Surgeon: Leonie Green, MD;  Location: [facility];  Service: General;  Laterality: Right;  ? CARPAL TUNNEL RELEASE Right   ? CATARACT EXTRACTION W/ INTRAOCULAR LENS  IMPLANT, BILATERAL    ? EXCISION OF BREAST BIOPSY Right 08/11/2017  ? excision of radial Scar  ? EYE SURGERY    ? JOINT REPLACEMENT Right   ? knee  ? KNEE ARTHROSCOPY Right 04/22/2016  ? Procedure: ARTHROSCOPY KNEE;  Surgeon: Leanor Kail, MD;  Location: [facility];  Service: Orthopedics;  Laterality: Right;  ? knee replacement Right 01/11/2017  ? NASAL SINUS SURGERY  06/05/14  ? Dr. Pryor Ochoa  ? POSTERIOR CERVICAL LAMINECTOMY  2005  ? C5-7; plate and screws  ? RENAL ANGIOGRAPHY Left 04/20/2020  ? Procedure: RENAL ANGIOGRAPHY;  Surgeon: Algernon Huxley, MD;  Location: [facility];  Service: Cardiovascular;  Laterality: Left;  ? TONSILLECTOMY    ? ? ? ?Social History  ? ?Tobacco Use  ? Smoking status: Never  ? Smokeless tobacco: Never  ?Vaping Use  ? Vaping Use: Never used  ?Substance Use Topics  ? Alcohol use: No  ? Drug use: No  ? ? ? ? ?Family History  ?Problem Relation Age of Onset  ? Ovarian cancer Mother   ? Stroke Father   ?     CVA  ? Breast cancer Daughter 62  ? Breast cancer Maternal Aunt 60  ? Breast cancer Maternal Aunt 90  ? Prostate cancer Neg Hx   ? Bladder Cancer Neg Hx   ? Kidney cancer Neg Hx   ? ? ? ?Allergies  ?Allergen Reactions  ? Chlorhexidine Hives  ?  SAGE wipes caused severe rash  ? Cyclobenzaprine Hcl   ?  Doesn't remember reaction  ? Etodolac   ?  Doesn't remember reaction  ? Lisinopril   ?  Coughing up blood  ? Meloxicam   ?  Doesn't remember reaction  ? Metaxalone   ?  Doesn't remember reaction  ? Neomycin-Bacitracin  Zn-Polymyx   ?  Skin irritation  ? Nifedipine Swelling  ? Nitrofurantoin   ?  Doesn't remember reaction  ? Sulfamethoxazole-Trimethoprim Hives  ? Sulfonamide Derivatives Hives  ? Tizanidine   ?  Liver problems  ? Vasotec [Enalapril Maleate]   ?  Doesn't remember reaction  ? Ibuprofen Rash  ? Tape Rash  ? ? ?REVIEW OF SYSTEMS (Negative unless checked) ?  ?Constitutional: '[]'$ Weight loss  '[]'$ Fever  '[]'$ Chills ?Cardiac: '[]'$ Chest pain   '[]'$ Chest pressure   '[x]'$ Palpitations   '[]'$ Shortness of breath when laying flat   '[]'$ Shortness of breath at rest   '[]'$ Shortness of breath with exertion. ?Vascular:  '[]'$ Pain in legs with walking   '[]'$ Pain in legs at rest   '[]'$ Pain in legs when laying flat   '[]'$ Claudication   '[]'$ Pain in feet when walking  '[]'$ Pain in feet at rest  '[]'$ Pain in feet when laying flat   '[]'$ History of DVT   '[]'$ Phlebitis   '[]'$ Swelling in legs   '[]'$ Varicose veins   '[]'$ Non-healing ulcers ?Pulmonary:   '[]'$ Uses home oxygen   '[]'$ Productive cough   '[]'$ Hemoptysis   '[]'$ Wheeze  '[]'$ COPD   '[]'$ Asthma ?Neurologic:  '[]'$ Dizziness  '[]'$ Blackouts   '[]'$ Seizures   '[]'$ History of stroke   '[]'$ History of TIA  '[]'$ Aphasia   '[]'$ Temporary blindness   '[]'$ Dysphagia   '[]'$ Weakness or numbness in arms   '[]'$ Weakness or numbness in legs ?Musculoskeletal:  '[x]'$ Arthritis   '[]'$ Joint swelling   '[x]'$ Joint pain   '[]'$ Low back pain ?Hematologic:  '[]'$ Easy bruising  '[]'$ Easy bleeding   '[]'$ Hypercoagulable state   '[]'$ Anemic  '[]'$ Hepatitis ?Gastrointestinal:  '[]'$ Blood in stool   '[]'$ Vomiting blood  '[x]'$ Gastroesophageal reflux/heartburn   '[]'$ Abdominal pain ?Genitourinary:  '[x]'$ Chronic kidney disease   '[]'$ Difficult urination  '[x]'$ Frequent urination  '[]'$ Burning with urination   '[]'$ Hematuria ?Skin:  '[]'$ Rashes   '[]'$ Ulcers   '[]'$ Wounds ?Psychological:  '[]'$ History of anxiety   '[]'$  History of major depression. ? ?Physical Examination ? ?BP (!) 185/84 (BP Location: Left Arm)   Pulse 72   Resp 16   Wt 224 lb 6.4 oz (101.8 kg)   BMI 36.22 kg/m?  ?Gen:  WD/WN, NAD ?Head: [place]/AT, No temporalis wasting. ?Ear/Nose/Throat: Hearing grossly  intact, nares w/o erythema or drainage ?Eyes: Conjunctiva clear. Sclera non-icteric ?Neck: Supple.  Trachea midline ?Pulmonary:  Good air movement, no use of accessory muscles.  ?Cardiac: RRR, no JVD ?Vascular

## 2022-02-23 ENCOUNTER — Other Ambulatory Visit: Payer: Self-pay | Admitting: Family Medicine

## 2022-02-23 DIAGNOSIS — R269 Unspecified abnormalities of gait and mobility: Secondary | ICD-10-CM

## 2022-02-28 ENCOUNTER — Other Ambulatory Visit: Payer: Self-pay | Admitting: Urology

## 2022-03-28 DIAGNOSIS — L821 Other seborrheic keratosis: Secondary | ICD-10-CM | POA: Diagnosis not present

## 2022-03-28 DIAGNOSIS — L820 Inflamed seborrheic keratosis: Secondary | ICD-10-CM | POA: Diagnosis not present

## 2022-03-28 DIAGNOSIS — D485 Neoplasm of uncertain behavior of skin: Secondary | ICD-10-CM | POA: Diagnosis not present

## 2022-03-28 DIAGNOSIS — D225 Melanocytic nevi of trunk: Secondary | ICD-10-CM | POA: Diagnosis not present

## 2022-03-28 DIAGNOSIS — Z08 Encounter for follow-up examination after completed treatment for malignant neoplasm: Secondary | ICD-10-CM | POA: Diagnosis not present

## 2022-03-28 DIAGNOSIS — Z85828 Personal history of other malignant neoplasm of skin: Secondary | ICD-10-CM | POA: Diagnosis not present

## 2022-03-28 DIAGNOSIS — L400 Psoriasis vulgaris: Secondary | ICD-10-CM | POA: Diagnosis not present

## 2022-03-28 DIAGNOSIS — L538 Other specified erythematous conditions: Secondary | ICD-10-CM | POA: Diagnosis not present

## 2022-05-24 DIAGNOSIS — D485 Neoplasm of uncertain behavior of skin: Secondary | ICD-10-CM | POA: Diagnosis not present

## 2022-05-24 DIAGNOSIS — L820 Inflamed seborrheic keratosis: Secondary | ICD-10-CM | POA: Diagnosis not present

## 2022-05-24 DIAGNOSIS — R208 Other disturbances of skin sensation: Secondary | ICD-10-CM | POA: Diagnosis not present

## 2022-05-24 DIAGNOSIS — C44722 Squamous cell carcinoma of skin of right lower limb, including hip: Secondary | ICD-10-CM | POA: Diagnosis not present

## 2022-06-06 DIAGNOSIS — M9901 Segmental and somatic dysfunction of cervical region: Secondary | ICD-10-CM | POA: Diagnosis not present

## 2022-06-06 DIAGNOSIS — M5451 Vertebrogenic low back pain: Secondary | ICD-10-CM | POA: Diagnosis not present

## 2022-06-06 DIAGNOSIS — M9903 Segmental and somatic dysfunction of lumbar region: Secondary | ICD-10-CM | POA: Diagnosis not present

## 2022-06-06 DIAGNOSIS — M9904 Segmental and somatic dysfunction of sacral region: Secondary | ICD-10-CM | POA: Diagnosis not present

## 2022-06-08 DIAGNOSIS — C44722 Squamous cell carcinoma of skin of right lower limb, including hip: Secondary | ICD-10-CM | POA: Diagnosis not present

## 2022-07-04 DIAGNOSIS — M9903 Segmental and somatic dysfunction of lumbar region: Secondary | ICD-10-CM | POA: Diagnosis not present

## 2022-07-04 DIAGNOSIS — M5451 Vertebrogenic low back pain: Secondary | ICD-10-CM | POA: Diagnosis not present

## 2022-07-04 DIAGNOSIS — M9904 Segmental and somatic dysfunction of sacral region: Secondary | ICD-10-CM | POA: Diagnosis not present

## 2022-07-04 DIAGNOSIS — M9901 Segmental and somatic dysfunction of cervical region: Secondary | ICD-10-CM | POA: Diagnosis not present

## 2022-08-02 DIAGNOSIS — M9901 Segmental and somatic dysfunction of cervical region: Secondary | ICD-10-CM | POA: Diagnosis not present

## 2022-08-02 DIAGNOSIS — M9904 Segmental and somatic dysfunction of sacral region: Secondary | ICD-10-CM | POA: Diagnosis not present

## 2022-08-02 DIAGNOSIS — M9903 Segmental and somatic dysfunction of lumbar region: Secondary | ICD-10-CM | POA: Diagnosis not present

## 2022-08-02 DIAGNOSIS — M5451 Vertebrogenic low back pain: Secondary | ICD-10-CM | POA: Diagnosis not present

## 2022-08-04 DIAGNOSIS — I255 Ischemic cardiomyopathy: Secondary | ICD-10-CM | POA: Diagnosis not present

## 2022-08-04 DIAGNOSIS — I10 Essential (primary) hypertension: Secondary | ICD-10-CM | POA: Diagnosis not present

## 2022-08-11 DIAGNOSIS — I10 Essential (primary) hypertension: Secondary | ICD-10-CM | POA: Diagnosis not present

## 2022-08-11 DIAGNOSIS — N1831 Chronic kidney disease, stage 3a: Secondary | ICD-10-CM | POA: Diagnosis not present

## 2022-08-11 DIAGNOSIS — I252 Old myocardial infarction: Secondary | ICD-10-CM | POA: Diagnosis not present

## 2022-08-11 DIAGNOSIS — Z0000 Encounter for general adult medical examination without abnormal findings: Secondary | ICD-10-CM | POA: Diagnosis not present

## 2022-08-15 DIAGNOSIS — Z85828 Personal history of other malignant neoplasm of skin: Secondary | ICD-10-CM | POA: Diagnosis not present

## 2022-08-18 DIAGNOSIS — M1811 Unilateral primary osteoarthritis of first carpometacarpal joint, right hand: Secondary | ICD-10-CM | POA: Diagnosis not present

## 2022-08-29 DIAGNOSIS — M5451 Vertebrogenic low back pain: Secondary | ICD-10-CM | POA: Diagnosis not present

## 2022-08-29 DIAGNOSIS — M9901 Segmental and somatic dysfunction of cervical region: Secondary | ICD-10-CM | POA: Diagnosis not present

## 2022-08-29 DIAGNOSIS — M9903 Segmental and somatic dysfunction of lumbar region: Secondary | ICD-10-CM | POA: Diagnosis not present

## 2022-08-29 DIAGNOSIS — M9904 Segmental and somatic dysfunction of sacral region: Secondary | ICD-10-CM | POA: Diagnosis not present

## 2022-09-26 ENCOUNTER — Encounter (INDEPENDENT_AMBULATORY_CARE_PROVIDER_SITE_OTHER): Payer: Self-pay

## 2022-09-30 DIAGNOSIS — D2262 Melanocytic nevi of left upper limb, including shoulder: Secondary | ICD-10-CM | POA: Diagnosis not present

## 2022-09-30 DIAGNOSIS — L720 Epidermal cyst: Secondary | ICD-10-CM | POA: Diagnosis not present

## 2022-09-30 DIAGNOSIS — D225 Melanocytic nevi of trunk: Secondary | ICD-10-CM | POA: Diagnosis not present

## 2022-09-30 DIAGNOSIS — L400 Psoriasis vulgaris: Secondary | ICD-10-CM | POA: Diagnosis not present

## 2022-09-30 DIAGNOSIS — L814 Other melanin hyperpigmentation: Secondary | ICD-10-CM | POA: Diagnosis not present

## 2022-09-30 DIAGNOSIS — Z85828 Personal history of other malignant neoplasm of skin: Secondary | ICD-10-CM | POA: Diagnosis not present

## 2022-10-03 DIAGNOSIS — M5451 Vertebrogenic low back pain: Secondary | ICD-10-CM | POA: Diagnosis not present

## 2022-10-03 DIAGNOSIS — M9903 Segmental and somatic dysfunction of lumbar region: Secondary | ICD-10-CM | POA: Diagnosis not present

## 2022-10-03 DIAGNOSIS — M9901 Segmental and somatic dysfunction of cervical region: Secondary | ICD-10-CM | POA: Diagnosis not present

## 2022-10-03 DIAGNOSIS — M9904 Segmental and somatic dysfunction of sacral region: Secondary | ICD-10-CM | POA: Diagnosis not present

## 2022-10-10 DIAGNOSIS — I252 Old myocardial infarction: Secondary | ICD-10-CM | POA: Diagnosis not present

## 2022-10-10 DIAGNOSIS — N1831 Chronic kidney disease, stage 3a: Secondary | ICD-10-CM | POA: Diagnosis not present

## 2022-10-10 DIAGNOSIS — I10 Essential (primary) hypertension: Secondary | ICD-10-CM | POA: Diagnosis not present

## 2022-10-18 DIAGNOSIS — R590 Localized enlarged lymph nodes: Secondary | ICD-10-CM | POA: Diagnosis not present

## 2022-10-18 DIAGNOSIS — J209 Acute bronchitis, unspecified: Secondary | ICD-10-CM | POA: Diagnosis not present

## 2022-10-31 DIAGNOSIS — M9901 Segmental and somatic dysfunction of cervical region: Secondary | ICD-10-CM | POA: Diagnosis not present

## 2022-10-31 DIAGNOSIS — M9904 Segmental and somatic dysfunction of sacral region: Secondary | ICD-10-CM | POA: Diagnosis not present

## 2022-10-31 DIAGNOSIS — M9903 Segmental and somatic dysfunction of lumbar region: Secondary | ICD-10-CM | POA: Diagnosis not present

## 2022-10-31 DIAGNOSIS — M5451 Vertebrogenic low back pain: Secondary | ICD-10-CM | POA: Diagnosis not present

## 2022-11-18 DIAGNOSIS — J0100 Acute maxillary sinusitis, unspecified: Secondary | ICD-10-CM | POA: Diagnosis not present

## 2022-11-22 DIAGNOSIS — J188 Other pneumonia, unspecified organism: Secondary | ICD-10-CM | POA: Diagnosis not present

## 2022-12-05 DIAGNOSIS — M5451 Vertebrogenic low back pain: Secondary | ICD-10-CM | POA: Diagnosis not present

## 2022-12-05 DIAGNOSIS — M9901 Segmental and somatic dysfunction of cervical region: Secondary | ICD-10-CM | POA: Diagnosis not present

## 2022-12-05 DIAGNOSIS — M9904 Segmental and somatic dysfunction of sacral region: Secondary | ICD-10-CM | POA: Diagnosis not present

## 2022-12-05 DIAGNOSIS — M9903 Segmental and somatic dysfunction of lumbar region: Secondary | ICD-10-CM | POA: Diagnosis not present

## 2022-12-14 DIAGNOSIS — I10 Essential (primary) hypertension: Secondary | ICD-10-CM | POA: Diagnosis not present

## 2022-12-14 DIAGNOSIS — I252 Old myocardial infarction: Secondary | ICD-10-CM | POA: Diagnosis not present

## 2022-12-14 DIAGNOSIS — N1831 Chronic kidney disease, stage 3a: Secondary | ICD-10-CM | POA: Diagnosis not present

## 2022-12-14 NOTE — Progress Notes (Signed)
12/15/2022 1:37 PM   Julie Mcbride 1936/09/03 [id number]  Referring provider: Dion Body, MD [facility] [place],  [place] [address]  Urological history: 1. rUTI's  -contributing factors of age, vaginal atrophy, incontinence and diuretics -documented urine cultures over the last year  None -vaginal estrogen cream    2. OAB -contributing factors of age, vaginal atrophy and diuretics   -cysto 2022-NED -intolerable side effects with anticholinergics -failed Gemtesa 75 mg daily -PVR 0 mL -managed with depends   HPI: Julie Mcbride is a 87 y.o. female who presents today for one year follow up.  She is having 1-7 daytime urinations, nocturia x 1-2 with a mild urge to urinate.  She does have urge incontinence.  She leaks 1-2 times a week and wears depends daily.  She does engage in fluid restriction and toilet mapping.  She is applying the vaginal estrogen cream.  She is managed with just pads.  She is not taking the Myrbetriq.    Patient denies any modifying or aggravating factors.  Patient denies any gross hematuria, dysuria or suprapubic/flank pain.  Patient denies any fevers, chills, nausea or vomiting.    PVR 0 mL   PMH: Past Medical History:  Diagnosis Date   Arthritis    Knees, hands   Basal cell carcinoma    back - removed   Bladder polyp    With previos hematuria   Diverticulitis    Dyspnea on exertion    Chronic   GERD (gastroesophageal reflux disease)    History of ETT    Myoview 7/10 EF 78%, no ischemia or infarction. Poor exercise tolerance (2'30", stopped due to fatigue). Reached 86% MPHR.   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 Ulice Dash   BACK SURGERY     BASAL CELL CARCINOMA EXCISION     back   BREAST BIOPSY Right 06/19/2017   Affirm Bx- Radial Scar   BREAST EXCISIONAL BIOPSY Right 1996   neg   BREAST LUMPECTOMY WITH NEEDLE LOCALIZATION Right 08/11/2017   Procedure: BREAST LUMPECTOMY WITH NEEDLE LOCALIZATION;  Surgeon: Leonie Green, MD;  Location: [facility];  Service: General;  Laterality: Right;   CARPAL TUNNEL RELEASE Right    CATARACT EXTRACTION W/ INTRAOCULAR LENS  IMPLANT, BILATERAL     EXCISION OF BREAST BIOPSY Right 08/11/2017   excision of radial Scar   EYE SURGERY     JOINT REPLACEMENT Right    knee   KNEE ARTHROSCOPY Right 04/22/2016   Procedure: ARTHROSCOPY KNEE;  Surgeon: Leanor Kail, MD;  Location: [facility];  Service: Orthopedics;  Laterality: Right;   knee replacement Right 01/11/2017   NASAL SINUS SURGERY  06/05/14   Dr. Pryor Ochoa   POSTERIOR CERVICAL LAMINECTOMY  2005   C5-7; plate and screws   RENAL ANGIOGRAPHY Left 04/20/2020   Procedure: RENAL ANGIOGRAPHY;  Surgeon: Algernon Huxley, MD;  Location: [facility];  Service: Cardiovascular;  Laterality: Left;   TONSILLECTOMY      Home Medications:  Allergies as  of 12/15/2022       Reactions   Chlorhexidine Hives   SAGE wipes caused severe rash   Cyclobenzaprine Hcl    Doesn't remember reaction   Etodolac    Doesn't remember reaction   Lisinopril    Coughing up blood   Meloxicam    Doesn't remember reaction   Metaxalone    Doesn't remember reaction   Neomycin-bacitracin Zn-polymyx    Skin irritation   Nifedipine Swelling   Nitrofurantoin    Doesn't remember reaction   Sulfamethoxazole-trimethoprim Hives   Sulfonamide Derivatives Hives   Tizanidine    Liver problems   Vasotec [enalapril Maleate]    Doesn't remember reaction   Ibuprofen Rash   Tape Rash        Medication List        Accurate as of December 15, 2022   1:37 PM. If you have any questions, ask your nurse or doctor.          STOP taking these medications    clobetasol ointment 0.05 % Commonly known as: TEMOVATE Stopped by: Wynter Isaacs, PA-C   Gemtesa 75 MG Tabs Generic drug: Vibegron Stopped by: Zara Council, PA-C   Myrbetriq 50 MG Tb24 tablet Generic drug: mirabegron ER Stopped by: Zara Council, PA-C       TAKE these medications    atorvastatin 10 MG tablet Commonly known as: Lipitor Take 1 tablet (10 mg total) by mouth daily.   bisoprolol-hydrochlorothiazide 10-6.25 MG tablet Commonly known as: ZIAC Take 1 tablet by mouth daily.   CALCIUM CARBONATE PO Take 300 mg by mouth.   cetirizine 10 MG tablet Commonly known as: ZYRTEC Take 10 mg by mouth daily.   cloNIDine 0.1 MG tablet Commonly known as: CATAPRES Take 0.1 mg by mouth 2 (two) times daily as needed.   clopidogrel 75 MG tablet Commonly known as: Plavix Take 1 tablet (75 mg total) by mouth daily.   Cranberry 500 MG Caps Take by mouth.   fluticasone 50 MCG/ACT nasal spray Commonly known as: FLONASE Place 2 sprays into both nostrils as needed for allergies or rhinitis.   hydrALAZINE 50 MG tablet Commonly known as: APRESOLINE Take 50 mg by mouth 3 (three) times daily.   ipratropium-albuterol 0.5-2.5 (3) MG/3ML Soln Commonly known as: DUONEB SMARTSIG:1 Vial(s) Via Nebulizer 4 Times Daily PRN   loratadine 10 MG tablet Commonly known as: CLARITIN Take 10 mg by mouth daily as needed for allergies.   losartan 100 MG tablet Commonly known as: COZAAR Take 100 mg by mouth daily. What changed: Another medication with the same name was removed. Continue taking this medication, and follow the directions you see here. Changed by: Zara Council, PA-C   magic mouthwash w/lidocaine Soln Take 5 mLs by mouth every 6 (six) hours as needed for mouth pain.   multivitamin capsule Take 2 capsules by mouth daily. [place] MVI   Premarin vaginal  cream Generic drug: conjugated estrogens Estrogen Cream Instruction Discard applicator Apply pea sized amount to tip of finger to urethra before bed. Wash hands well after application. Use Monday, Wednesday and Friday   rOPINIRole 0.25 MG tablet Commonly known as: REQUIP Take 0.5 mg by mouth at bedtime.   triamcinolone ointment 0.1 % Commonly known as: KENALOG Apply topically 2 (two) times daily as needed.   UNABLE TO FIND Herbal Supplement   Vitamin D 50 MCG (2000 UT) tablet Take 4,000 Units by mouth daily.        Allergies:  Allergies  Allergen  Reactions   Chlorhexidine Hives    SAGE wipes caused severe rash   Cyclobenzaprine Hcl     Doesn't remember reaction   Etodolac     Doesn't remember reaction   Lisinopril     Coughing up blood   Meloxicam     Doesn't remember reaction   Metaxalone     Doesn't remember reaction   Neomycin-Bacitracin Zn-Polymyx     Skin irritation   Nifedipine Swelling   Nitrofurantoin     Doesn't remember reaction   Sulfamethoxazole-Trimethoprim Hives   Sulfonamide Derivatives Hives   Tizanidine     Liver problems   Vasotec [Enalapril Maleate]     Doesn't remember reaction   Ibuprofen Rash   Tape Rash    Family History: Family History  Problem Relation Age of Onset   Ovarian cancer Mother    Stroke Father        CVA   Breast cancer Daughter 37   Breast cancer Maternal Aunt 60   Breast cancer Maternal Aunt 90   Prostate cancer Neg Hx    Bladder Cancer Neg Hx    Kidney cancer Neg Hx     Social History:  reports that she has never smoked. She has never used smokeless tobacco. She reports that she does not drink alcohol and does not use drugs.  ROS: Pertinent ROS in HPI  Physical Exam: BP 135/72 (BP Location: Left Arm, Patient Position: Sitting, Cuff Size: Large)   Pulse 76   Ht '5\' 4"'$  (1.626 m)   Wt 224 lb (101.6 kg)   BMI 38.45 kg/m   Constitutional:  Well nourished. Alert and oriented, No acute distress. HEENT: [place]  AT, moist mucus membranes.  Trachea midline Cardiovascular: No clubbing, cyanosis, or edema. Respiratory: Normal respiratory effort, no increased work of breathing. Neurologic: Grossly intact, no focal deficits, moving all 4 extremities. Psychiatric: Normal mood and affect.    Laboratory Data: WBC (White Blood Cell Count) 4.1 - 10.2 10^3/uL 7.5  RBC (Red Blood Cell Count) 4.04 - 5.48 10^6/uL 4.21  Hemoglobin 12.0 - 15.0 gm/dL 12.5  Hematocrit 35.0 - 47.0 % 38.9  MCV (Mean Corpuscular Volume) 80.0 - 100.0 fl 92.4  MCH (Mean Corpuscular Hemoglobin) 27.0 - 31.2 pg 29.7  MCHC (Mean Corpuscular Hemoglobin Concentration) 32.0 - 36.0 gm/dL 32.1  Platelet Count 150 - 450 10^3/uL 168  RDW-CV (Red Cell Distribution Width) 11.6 - 14.8 % 13.2  MPV (Mean Platelet Volume) 9.4 - 12.4 fl 12.5 High   Neutrophils 1.50 - 7.80 10^3/uL 4.00  Lymphocytes 1.00 - 3.60 10^3/uL 2.29  Monocytes 0.00 - 1.50 10^3/uL 0.93  Eosinophils 0.00 - 0.55 10^3/uL 0.23  Basophils 0.00 - 0.09 10^3/uL 0.03  Neutrophil % 32.0 - 70.0 % 53.4  Lymphocyte % 10.0 - 50.0 % 30.6  Monocyte % 4.0 - 13.0 % 12.4  Eosinophil % 1.0 - 5.0 % 3.1  Basophil% 0.0 - 2.0 % 0.4  Immature Granulocyte % <=0.7 % 0.1  Immature Granulocyte Count <=0.06 10^3/L 0.01  Resulting Agency  [facility]   Specimen Collected: 08/04/22 07:24   Performed by: [place]: 08/04/22 08:01  Received From: [facility]  Result Received: 12/14/22 09:55   Cholesterol, Total 100 - 200 mg/dL 114  Triglyceride 35 - 199 mg/dL 71  HDL (High Density Lipoprotein) Cholesterol 35.0 - 85.0 mg/dL 46.9  LDL Calculated 0 - 130 mg/dL 53  VLDL Cholesterol mg/dL 14  Cholesterol/HDL Ratio  2.4  Resulting Agency  [facility]   Specimen Collected: 08/04/22 07:24   Performed by: [facility]: 08/04/22 10:41  Received From: [facility]  Result Received:  12/14/22 09:55   Glucose 70 - 110 mg/dL 103  Sodium 136 - 145 mmol/L 141  Potassium 3.6 - 5.1 mmol/L 3.9  Chloride 97 - 109 mmol/L 105  Carbon Dioxide (CO2) 22.0 - 32.0 mmol/L 29.9  Urea Nitrogen (BUN) 7 - 25 mg/dL 28 High   Creatinine 0.6 - 1.1 mg/dL 1.3 High   Glomerular Filtration Rate (eGFR) >60 mL/min/1.73sq m 39 Low   Calcium 8.7 - 10.3 mg/dL 9.1  AST 8 - 39 U/L 18  ALT 5 - 38 U/L 19  Alk Phos (alkaline Phosphatase) 34 - 104 U/L 43  Albumin 3.5 - 4.8 g/dL 3.7  Bilirubin, Total 0.3 - 1.2 mg/dL 0.5  Protein, Total 6.1 - 7.9 g/dL 5.7 Low   A/G Ratio 1.0 - 5.0 gm/dL 1.9  Resulting Agency  [facility]   Specimen Collected: 01/28/22 07:22   Performed by: [place]: 01/28/22 08:47  Received From: [facility]  Result Received: 12/14/22 09:55  I have reviewed the labs.   Pertinent Imaging:  12/15/22 13:22  Scan Result 37m   Assessment & Plan:    1. OAB -she is not taking the Myrbetriq  -she is doing well without medications  2. rUTI's -Asymptomatic at this visit -Has not had a urinary tract infection since starting the vaginal estrogen cream  3.  Vaginal atrophy -Continue the vaginal estrogen cream 3 nights weekly  Return in about 1 year (around 12/16/2023) for PVR and OAB questionnaire.  These notes generated with voice recognition software. I apologize for typographical errors.  [id number] SWishramBDiamond City [place] [phone]

## 2022-12-15 ENCOUNTER — Encounter: Payer: Self-pay | Admitting: Urology

## 2022-12-15 ENCOUNTER — Ambulatory Visit (INDEPENDENT_AMBULATORY_CARE_PROVIDER_SITE_OTHER): Payer: PPO | Admitting: Urology

## 2022-12-15 VITALS — BP 135/72 | HR 76 | Ht 64.0 in | Wt 224.0 lb

## 2022-12-15 DIAGNOSIS — Z87440 Personal history of urinary (tract) infections: Secondary | ICD-10-CM

## 2022-12-15 DIAGNOSIS — N390 Urinary tract infection, site not specified: Secondary | ICD-10-CM

## 2022-12-15 DIAGNOSIS — N952 Postmenopausal atrophic vaginitis: Secondary | ICD-10-CM | POA: Diagnosis not present

## 2022-12-15 DIAGNOSIS — N3281 Overactive bladder: Secondary | ICD-10-CM

## 2022-12-15 LAB — BLADDER SCAN AMB NON-IMAGING

## 2022-12-15 MED ORDER — PREMARIN 0.625 MG/GM VA CREA: TOPICAL_CREAM | VAGINAL | 11 refills | Status: DC

## 2023-01-02 DIAGNOSIS — M5451 Vertebrogenic low back pain: Secondary | ICD-10-CM | POA: Diagnosis not present

## 2023-01-02 DIAGNOSIS — M9904 Segmental and somatic dysfunction of sacral region: Secondary | ICD-10-CM | POA: Diagnosis not present

## 2023-01-02 DIAGNOSIS — M9901 Segmental and somatic dysfunction of cervical region: Secondary | ICD-10-CM | POA: Diagnosis not present

## 2023-01-02 DIAGNOSIS — M9903 Segmental and somatic dysfunction of lumbar region: Secondary | ICD-10-CM | POA: Diagnosis not present

## 2023-01-30 DIAGNOSIS — M9903 Segmental and somatic dysfunction of lumbar region: Secondary | ICD-10-CM | POA: Diagnosis not present

## 2023-01-30 DIAGNOSIS — M9901 Segmental and somatic dysfunction of cervical region: Secondary | ICD-10-CM | POA: Diagnosis not present

## 2023-01-30 DIAGNOSIS — M9904 Segmental and somatic dysfunction of sacral region: Secondary | ICD-10-CM | POA: Diagnosis not present

## 2023-01-30 DIAGNOSIS — M5451 Vertebrogenic low back pain: Secondary | ICD-10-CM | POA: Diagnosis not present

## 2023-02-02 DIAGNOSIS — I10 Essential (primary) hypertension: Secondary | ICD-10-CM | POA: Diagnosis not present

## 2023-02-02 DIAGNOSIS — I252 Old myocardial infarction: Secondary | ICD-10-CM | POA: Diagnosis not present

## 2023-02-09 DIAGNOSIS — I255 Ischemic cardiomyopathy: Secondary | ICD-10-CM | POA: Diagnosis not present

## 2023-02-09 DIAGNOSIS — I10 Essential (primary) hypertension: Secondary | ICD-10-CM | POA: Diagnosis not present

## 2023-02-09 DIAGNOSIS — G2581 Restless legs syndrome: Secondary | ICD-10-CM | POA: Diagnosis not present

## 2023-02-09 DIAGNOSIS — E6609 Other obesity due to excess calories: Secondary | ICD-10-CM | POA: Diagnosis not present

## 2023-02-09 DIAGNOSIS — N1831 Chronic kidney disease, stage 3a: Secondary | ICD-10-CM | POA: Diagnosis not present

## 2023-02-09 DIAGNOSIS — I252 Old myocardial infarction: Secondary | ICD-10-CM | POA: Diagnosis not present

## 2023-02-13 ENCOUNTER — Other Ambulatory Visit (INDEPENDENT_AMBULATORY_CARE_PROVIDER_SITE_OTHER): Payer: Self-pay | Admitting: Vascular Surgery

## 2023-02-13 DIAGNOSIS — I701 Atherosclerosis of renal artery: Secondary | ICD-10-CM

## 2023-02-17 ENCOUNTER — Encounter (INDEPENDENT_AMBULATORY_CARE_PROVIDER_SITE_OTHER): Payer: Self-pay | Admitting: Vascular Surgery

## 2023-02-17 ENCOUNTER — Ambulatory Visit (INDEPENDENT_AMBULATORY_CARE_PROVIDER_SITE_OTHER): Payer: PPO

## 2023-02-17 ENCOUNTER — Ambulatory Visit (INDEPENDENT_AMBULATORY_CARE_PROVIDER_SITE_OTHER): Payer: PPO | Admitting: Vascular Surgery

## 2023-02-17 VITALS — BP 146/65 | HR 61 | Resp 16 | Wt 225.0 lb

## 2023-02-17 DIAGNOSIS — I701 Atherosclerosis of renal artery: Secondary | ICD-10-CM

## 2023-02-17 DIAGNOSIS — I150 Renovascular hypertension: Secondary | ICD-10-CM | POA: Diagnosis not present

## 2023-02-17 DIAGNOSIS — N183 Chronic kidney disease, stage 3 unspecified: Secondary | ICD-10-CM

## 2023-02-17 NOTE — Assessment & Plan Note (Signed)
Duplex today shows a patent left renal artery stent and no hemodynamically significant stenosis of the right renal artery.  Clinically improved.  Continue to follow on an annual basis.  No change in medications.

## 2023-02-17 NOTE — Progress Notes (Signed)
MRN : [id number]  Julie Mcbride is a 87 y.o. (10/11/1936) female who presents with chief complaint of  Chief Complaint  Patient presents with   Follow-up    Ultrasound follow up  .  History of Present Illness: Patient returns today in follow up of her renal artery stenosis.  Her blood pressure control has been significantly better after renal artery stent placement almost 3 years ago.  It is still occasionally risen is required manipulation medications by her primary care physician.  Today, it is 146/65.  Duplex today shows a patent left renal artery stent and no hemodynamically significant stenosis of the right renal artery.  Current Outpatient Medications  Medication Sig Dispense Refill   atorvastatin (LIPITOR) 10 MG tablet Take 1 tablet (10 mg total) by mouth daily. 30 tablet 11   bisoprolol-hydrochlorothiazide (ZIAC) 10-6.25 MG per tablet Take 1 tablet by mouth daily.     cetirizine (ZYRTEC) 10 MG tablet Take 10 mg by mouth daily.     Cholecalciferol (VITAMIN D) 50 MCG (2000 UT) tablet Take 4,000 Units by mouth daily.      cloNIDine (CATAPRES) 0.1 MG tablet Take 0.1 mg by mouth 2 (two) times daily as needed.     clopidogrel (PLAVIX) 75 MG tablet Take 1 tablet (75 mg total) by mouth daily. 30 tablet 11   conjugated estrogens (PREMARIN) vaginal cream Estrogen Cream Instruction Discard applicator Apply pea sized amount to tip of finger to urethra before bed. Wash hands well after application. Use Monday, Wednesday and Friday 42.5 g 11   Cranberry 500 MG CAPS Take by mouth.     fluticasone (FLONASE) 50 MCG/ACT nasal spray Place 2 sprays into both nostrils as needed for allergies or rhinitis.     hydrALAZINE (APRESOLINE) 50 MG tablet Take 50 mg by mouth 3 (three) times daily.      losartan (COZAAR) 100 MG tablet Take 100 mg by mouth daily.     Multiple Vitamin (MULTIVITAMIN) capsule Take 2 capsules by mouth daily. GNC MVI     rOPINIRole (REQUIP) 0.25 MG tablet Take 0.5 mg by mouth at  bedtime.     triamcinolone ointment (KENALOG) 0.1 % Apply topically 2 (two) times daily as needed.     UNABLE TO FIND Herbal Supplement     Calcium Carbonate Antacid (CALCIUM CARBONATE PO) Take 300 mg by mouth. (Patient not taking: Reported on 02/17/2023)     ipratropium-albuterol (DUONEB) 0.5-2.5 (3) MG/3ML SOLN SMARTSIG:1 Vial(s) Via Nebulizer 4 Times Daily PRN     loratadine (CLARITIN) 10 MG tablet Take 10 mg by mouth daily as needed for allergies. (Patient not taking: Reported on 02/17/2023)     magic mouthwash w/lidocaine SOLN Take 5 mLs by mouth every 6 (six) hours as needed for mouth pain. (Patient not taking: Reported on 02/17/2023)     No current facility-administered medications for this visit.    Past Medical History:  Diagnosis Date   Arthritis    Knees, hands   Basal cell carcinoma    back - removed   Bladder polyp    With previos hematuria   Diverticulitis    Dyspnea on exertion    Chronic   GERD (gastroesophageal reflux disease)    History of ETT    Myoview 7/10 EF 78%, no ischemia or infarction. Poor exercise tolerance (2'30", stopped due to fatigue). Reached 86% MPHR.   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 Ulice Dash   BACK SURGERY     BASAL CELL CARCINOMA EXCISION     back   BREAST BIOPSY Right 06/19/2017   Affirm Bx- Radial Scar   BREAST EXCISIONAL BIOPSY Right 1996   neg   BREAST LUMPECTOMY WITH NEEDLE LOCALIZATION Right 08/11/2017   Procedure: BREAST LUMPECTOMY WITH NEEDLE LOCALIZATION;  Surgeon: Leonie Green, MD;  Location: [facility];  Service: General;  Laterality: Right;   CARPAL TUNNEL RELEASE Right    CATARACT  EXTRACTION W/ INTRAOCULAR LENS  IMPLANT, BILATERAL     EXCISION OF BREAST BIOPSY Right 08/11/2017   excision of radial Scar   EYE SURGERY     JOINT REPLACEMENT Right    knee   KNEE ARTHROSCOPY Right 04/22/2016   Procedure: ARTHROSCOPY KNEE;  Surgeon: Leanor Kail, MD;  Location: [facility];  Service: Orthopedics;  Laterality: Right;   knee replacement Right 01/11/2017   NASAL SINUS SURGERY  06/05/14   Dr. Pryor Ochoa   POSTERIOR CERVICAL LAMINECTOMY  2005   C5-7; plate and screws   RENAL ANGIOGRAPHY Left 04/20/2020   Procedure: RENAL ANGIOGRAPHY;  Surgeon: Algernon Huxley, MD;  Location: [facility];  Service: Cardiovascular;  Laterality: Left;   TONSILLECTOMY       Social History   Tobacco Use   Smoking status: Never   Smokeless tobacco: Never  Vaping Use   Vaping Use: Never used  Substance Use Topics   Alcohol use: No   Drug use: No       Family History  Problem Relation Age of Onset   Ovarian cancer Mother    Stroke Father        CVA   Breast cancer Daughter 70   Breast cancer Maternal Aunt 60   Breast cancer Maternal Aunt 90   Prostate cancer Neg Hx    Bladder Cancer Neg Hx    Kidney cancer Neg Hx      Allergies  Allergen Reactions   Chlorhexidine Hives    SAGE wipes caused severe rash   Cyclobenzaprine Hcl     Doesn't remember reaction   Etodolac     Doesn't remember reaction   Lisinopril     Coughing up blood   Meloxicam     Doesn't remember reaction   Metaxalone     Doesn't remember reaction   Neomycin-Bacitracin Zn-Polymyx     Skin irritation   Nifedipine Swelling   Nitrofurantoin     Doesn't remember reaction   Sulfamethoxazole-Trimethoprim Hives   Sulfonamide Derivatives Hives   Tizanidine     Liver problems   Vasotec [Enalapril Maleate]     Doesn't remember reaction   Ibuprofen Rash   Tape Rash    REVIEW OF SYSTEMS (Negative unless checked)   Constitutional: [] Weight loss  [] Fever  [] Chills Cardiac: [] Chest pain    [] Chest pressure   [x] Palpitations   [] Shortness of breath when laying flat   [] Shortness of breath at rest   [] Shortness of breath with exertion. Vascular:  [] Pain in legs with walking   [] Pain in legs at rest   [] Pain in legs when laying flat   [] Claudication   []   Pain in feet when walking  [] Pain in feet at rest  [] Pain in feet when laying flat   [] History of DVT   [] Phlebitis   [] Swelling in legs   [] Varicose veins   [] Non-healing ulcers Pulmonary:   [] Uses home oxygen   [] Productive cough   [] Hemoptysis   [] Wheeze  [] COPD   [] Asthma Neurologic:  [] Dizziness  [] Blackouts   [] Seizures   [] History of stroke   [] History of TIA  [] Aphasia   [] Temporary blindness   [] Dysphagia   [] Weakness or numbness in arms   [] Weakness or numbness in legs Musculoskeletal:  [x] Arthritis   [] Joint swelling   [x] Joint pain   [] Low back pain Hematologic:  [] Easy bruising  [] Easy bleeding   [] Hypercoagulable state   [] Anemic  [] Hepatitis Gastrointestinal:  [] Blood in stool   [] Vomiting blood  [x] Gastroesophageal reflux/heartburn   [] Abdominal pain Genitourinary:  [x] Chronic kidney disease   [] Difficult urination  [x] Frequent urination  [] Burning with urination   [] Hematuria Skin:  [] Rashes   [] Ulcers   [] Wounds Psychological:  [] History of anxiety   []  History of major depression.  Physical Examination  BP (!) 146/65 (BP Location: Left Arm)   Pulse 61   Resp 16   Wt 225 lb (102.1 kg)   BMI 38.62 kg/m  Gen:  WD/WN, NAD. Appears younger than stated age. Head: [place]/AT, No temporalis wasting. Ear/Nose/Throat: Hearing grossly intact, nares w/o erythema or drainage Eyes: Conjunctiva clear. Sclera non-icteric Neck: Supple.  Trachea midline Pulmonary:  Good air movement, no use of accessory muscles.  Cardiac: somewhat irregular Vascular:  Vessel Right Left  Radial Palpable Palpable               Musculoskeletal: M/S 5/5 throughout.  No deformity or atrophy. No edema. Neurologic: Sensation grossly intact in  extremities.  Symmetrical.  Speech is fluent.  Psychiatric: Judgment intact, Mood & affect appropriate for pt's clinical situation. Dermatologic: No rashes or ulcers noted.  No cellulitis or open wounds.     Labs Recent Results (from the past 2160 hour(s))  Bladder Scan (Post Void Residual) in office     Status: None   Collection Time: 12/15/22  1:22 PM  Result Value Ref Range   Scan Result 49ml     Radiology No results found.  Assessment/Plan CKD (chronic kidney disease) stage 3, GFR 30-59 ml/min Another concern for possible renal artery issues.  Blood pressure control is important for reducing progression of her chronic kidney disease.   Renovascular hypertension Blood pressure control is much better after renal artery stent placement two years ago.  Renal artery stenosis (HCC) Duplex today shows a patent left renal artery stent and no hemodynamically significant stenosis of the right renal artery.  Clinically improved.  Continue to follow on an annual basis.  No change in medications.    Leotis Pain, MD  02/17/2023 12:09 PM    This note was created with Dragon medical transcription system.  Any errors from dictation are purely unintentional

## 2023-02-23 DIAGNOSIS — I10 Essential (primary) hypertension: Secondary | ICD-10-CM | POA: Diagnosis not present

## 2023-02-23 DIAGNOSIS — N1831 Chronic kidney disease, stage 3a: Secondary | ICD-10-CM | POA: Diagnosis not present

## 2023-02-23 DIAGNOSIS — I252 Old myocardial infarction: Secondary | ICD-10-CM | POA: Diagnosis not present

## 2023-02-27 DIAGNOSIS — M9903 Segmental and somatic dysfunction of lumbar region: Secondary | ICD-10-CM | POA: Diagnosis not present

## 2023-02-27 DIAGNOSIS — M5451 Vertebrogenic low back pain: Secondary | ICD-10-CM | POA: Diagnosis not present

## 2023-02-27 DIAGNOSIS — M9904 Segmental and somatic dysfunction of sacral region: Secondary | ICD-10-CM | POA: Diagnosis not present

## 2023-02-27 DIAGNOSIS — M9901 Segmental and somatic dysfunction of cervical region: Secondary | ICD-10-CM | POA: Diagnosis not present

## 2023-03-08 ENCOUNTER — Emergency Department: Payer: PPO

## 2023-03-08 ENCOUNTER — Other Ambulatory Visit: Payer: Self-pay

## 2023-03-08 ENCOUNTER — Observation Stay
Admission: EM | Admit: 2023-03-08 | Discharge: 2023-03-09 | Disposition: A | Discharge status: Home Health | Payer: PPO | Attending: Internal Medicine | Admitting: Internal Medicine

## 2023-03-08 DIAGNOSIS — I130 Hypertensive heart and chronic kidney disease with heart failure and stage 1 through stage 4 chronic kidney disease, or unspecified chronic kidney disease: Secondary | ICD-10-CM | POA: Diagnosis not present

## 2023-03-08 DIAGNOSIS — M25561 Pain in right knee: Secondary | ICD-10-CM | POA: Diagnosis not present

## 2023-03-08 DIAGNOSIS — I5020 Unspecified systolic (congestive) heart failure: Secondary | ICD-10-CM | POA: Insufficient documentation

## 2023-03-08 DIAGNOSIS — Z85828 Personal history of other malignant neoplasm of skin: Secondary | ICD-10-CM | POA: Diagnosis not present

## 2023-03-08 DIAGNOSIS — Z7902 Long term (current) use of antithrombotics/antiplatelets: Secondary | ICD-10-CM | POA: Insufficient documentation

## 2023-03-08 DIAGNOSIS — M25461 Effusion, right knee: Secondary | ICD-10-CM | POA: Diagnosis not present

## 2023-03-08 DIAGNOSIS — R040 Epistaxis: Secondary | ICD-10-CM | POA: Diagnosis not present

## 2023-03-08 DIAGNOSIS — S0083XA Contusion of other part of head, initial encounter: Secondary | ICD-10-CM | POA: Diagnosis not present

## 2023-03-08 DIAGNOSIS — I10 Essential (primary) hypertension: Secondary | ICD-10-CM | POA: Diagnosis not present

## 2023-03-08 DIAGNOSIS — Z79899 Other long term (current) drug therapy: Secondary | ICD-10-CM | POA: Insufficient documentation

## 2023-03-08 DIAGNOSIS — W01198A Fall on same level from slipping, tripping and stumbling with subsequent striking against other object, initial encounter: Secondary | ICD-10-CM | POA: Diagnosis not present

## 2023-03-08 DIAGNOSIS — Z043 Encounter for examination and observation following other accident: Secondary | ICD-10-CM | POA: Diagnosis not present

## 2023-03-08 DIAGNOSIS — R519 Headache, unspecified: Secondary | ICD-10-CM | POA: Diagnosis present

## 2023-03-08 DIAGNOSIS — S022XXA Fracture of nasal bones, initial encounter for closed fracture: Secondary | ICD-10-CM | POA: Insufficient documentation

## 2023-03-08 DIAGNOSIS — Z96651 Presence of right artificial knee joint: Secondary | ICD-10-CM | POA: Insufficient documentation

## 2023-03-08 DIAGNOSIS — S0511XA Contusion of eyeball and orbital tissues, right eye, initial encounter: Secondary | ICD-10-CM

## 2023-03-08 DIAGNOSIS — S61411A Laceration without foreign body of right hand, initial encounter: Secondary | ICD-10-CM | POA: Insufficient documentation

## 2023-03-08 DIAGNOSIS — N183 Chronic kidney disease, stage 3 unspecified: Secondary | ICD-10-CM | POA: Diagnosis present

## 2023-03-08 DIAGNOSIS — N1831 Chronic kidney disease, stage 3a: Secondary | ICD-10-CM | POA: Diagnosis present

## 2023-03-08 DIAGNOSIS — Z7952 Long term (current) use of systemic steroids: Secondary | ICD-10-CM | POA: Insufficient documentation

## 2023-03-08 DIAGNOSIS — Z7901 Long term (current) use of anticoagulants: Secondary | ICD-10-CM | POA: Insufficient documentation

## 2023-03-08 DIAGNOSIS — W19XXXA Unspecified fall, initial encounter: Secondary | ICD-10-CM | POA: Diagnosis not present

## 2023-03-08 DIAGNOSIS — S022XXB Fracture of nasal bones, initial encounter for open fracture: Secondary | ICD-10-CM | POA: Diagnosis not present

## 2023-03-08 DIAGNOSIS — I255 Ischemic cardiomyopathy: Secondary | ICD-10-CM | POA: Diagnosis present

## 2023-03-08 DIAGNOSIS — S0990XA Unspecified injury of head, initial encounter: Secondary | ICD-10-CM | POA: Diagnosis not present

## 2023-03-08 MED ORDER — TRANEXAMIC ACID FOR EPISTAXIS
500.0000 mg | Freq: Once | TOPICAL | Status: AC
  Administered 2023-03-08: 500 mg via TOPICAL
  Filled 2023-03-08: qty 10

## 2023-03-08 MED ORDER — MORPHINE SULFATE (PF) 2 MG/ML IV SOLN
2.0000 mg | Freq: Once | INTRAVENOUS | Status: AC
  Administered 2023-03-09: 2 mg via INTRAVENOUS
  Filled 2023-03-08: qty 1

## 2023-03-08 MED ORDER — OXYMETAZOLINE HCL 0.05 % NA SOLN
1.0000 | Freq: Once | NASAL | Status: AC
  Administered 2023-03-08: 1 via NASAL
  Filled 2023-03-08: qty 30

## 2023-03-08 MED ORDER — FENTANYL CITRATE PF 50 MCG/ML IJ SOSY
50.0000 ug | PREFILLED_SYRINGE | Freq: Once | INTRAMUSCULAR | Status: AC
  Administered 2023-03-08: 50 ug via INTRAVENOUS
  Filled 2023-03-08: qty 1

## 2023-03-08 MED ORDER — AMOXICILLIN-POT CLAVULANATE 875-125 MG PO TABS
1.0000 | ORAL_TABLET | Freq: Once | ORAL | Status: AC
  Administered 2023-03-09: 1 via ORAL
  Filled 2023-03-08: qty 1

## 2023-03-08 MED ORDER — LIDOCAINE HCL (PF) 1 % IJ SOLN
5.0000 mL | Freq: Once | INTRAMUSCULAR | Status: AC
  Administered 2023-03-08: 5 mL
  Filled 2023-03-08: qty 5

## 2023-03-08 NOTE — ED Triage Notes (Signed)
Pt to ED for mechanical fall Tripped, fell over curb in parking lot, onto face Takes blood thinners  Takes BP meds TID, took twice today. BP with EMS was 220/112, no dizziness or HA. HR 89, 98% RA Hx nasal fracture Nose is bleeding slowly (oozing) R forehead and periorbital area is swollen and bruised. Worse now than earlier per EMS R hand also wrapped, laceration per EMS. Also L lateral hand  Pt is alert and oriented. Pt appears anxious

## 2023-03-08 NOTE — ED Provider Notes (Signed)
[facility] Provider Note    Event Date/Time   First MD Initiated Contact with Patient 03/08/23 1734     (approximate)   History   Fall   HPI  Julie Mcbride is a 87 y.o. female who presents to the urgency department today after mechanical fall.  She states she was walking when she went over some uneven concrete.  She caught her right toes and fell down.  Hit her right knee, her right hand and the right side of her face.  She is complaining of pain in the face and right hand.  Also started having a nosebleed.  The patient states she is on Plavix.  Denies any loss of consciousness.  Denies any recent illness.     Physical Exam   Triage Vital Signs: ED Triage Vitals  Enc Vitals Group     BP 03/08/23 1727 (!) 200/87     Pulse Rate 03/08/23 1726 85     Resp 03/08/23 1726 20     Temp 03/08/23 1726 98.7 F (37.1 C)     Temp Source 03/08/23 1726 Oral     SpO2 03/08/23 1726 97 %     Weight 03/08/23 1722 224 lb 13.9 oz (102 kg)     Height 03/08/23 1722 5\' 5"  (1.651 m)     Head Circumference --      Peak Flow --      Pain Score 03/08/23 1721 6     Pain Loc --      Pain Edu? --      Excl. in GC? --     Most recent vital signs: Vitals:   03/08/23 1726 03/08/23 1727  BP:  (!) 200/87  Pulse: 85   Resp: 20   Temp: 98.7 F (37.1 C)   SpO2: 97%    General: Awake, alert, oriented. CV:  Good peripheral perfusion. Regular rate and rhythm. Resp:  Normal effort. Abd:  No distention.  Other:  Swelling and ecchymosis to right eye and nose. Epistaxis. Bruising and swelling to right knee. Laceration to right hand at the base of the fifth digit.    ED Results / Procedures / Treatments   Labs (all labs ordered are listed, but only abnormal results are displayed) Labs Reviewed - No data to display   EKG  None   RADIOLOGY I independently interpreted and visualized the right knee. My interpretation: No fracture Radiology interpretation:   IMPRESSION:  1. Right knee replacement without evidence of hardware loosening or  infection.  2. Small joint effusion.    I independently interpreted and visualized the ct head/cervical spine/max face. My interpretation: No intracranial bleed, no cervical fracture. Nasal fracture.  Radiology interpretation:  IMPRESSION:  HEAD CT:    No acute or traumatic intracranial finding. Age related atrophy and  chronic small-vessel ischemic change of the white matter.    MAXILLOFACIAL CT:    Comminuted nasal fractures displaced slightly towards the left. No  other facial fracture. Pronounced right facial and superficial  periorbital soft tissue swelling/hematoma. No evidence of injury to  the globe or postseptal orbital bleeding.      PROCEDURES:  Critical Care performed: Yes  CRITICAL CARE Performed by: Phineas Semen   Total critical care time: *** minutes  Critical care time was exclusive of separately billable procedures and treating other patients.  Critical care was necessary to treat or prevent imminent or life-threatening deterioration.  Critical care was time spent personally by me on the following  activities: development of treatment plan with patient and/or surrogate as well as nursing, discussions with consultants, evaluation of patient's response to treatment, examination of patient, obtaining history from patient or surrogate, ordering and performing treatments and interventions, ordering and review of laboratory studies, ordering and review of radiographic studies, pulse oximetry and re-evaluation of patient's condition.   Procedures    MEDICATIONS ORDERED IN ED: Medications  fentaNYL (SUBLIMAZE) injection 50 mcg (has no administration in time range)     IMPRESSION / MDM / ASSESSMENT AND PLAN / ED COURSE  I reviewed the triage vital signs and the nursing notes.                              Differential diagnosis includes, but is not limited to,  ***  Patient's presentation is most consistent with {EM COPA:27473}   ***The patient is on the cardiac monitor to evaluate for evidence of arrhythmia and/or significant heart rate changes.  ***      FINAL CLINICAL IMPRESSION(S) / ED DIAGNOSES   Final diagnoses:  None     Rx / DC Orders   ED Discharge Orders     None        Note:  This document was prepared using Dragon voice recognition software and may include unintentional dictation errors.

## 2023-03-09 ENCOUNTER — Emergency Department: Payer: PPO

## 2023-03-09 ENCOUNTER — Observation Stay: Payer: PPO

## 2023-03-09 DIAGNOSIS — S0083XA Contusion of other part of head, initial encounter: Secondary | ICD-10-CM

## 2023-03-09 DIAGNOSIS — M25551 Pain in right hip: Secondary | ICD-10-CM | POA: Diagnosis not present

## 2023-03-09 DIAGNOSIS — R040 Epistaxis: Secondary | ICD-10-CM | POA: Diagnosis not present

## 2023-03-09 DIAGNOSIS — M79641 Pain in right hand: Secondary | ICD-10-CM | POA: Diagnosis not present

## 2023-03-09 DIAGNOSIS — S61411S Laceration without foreign body of right hand, sequela: Secondary | ICD-10-CM

## 2023-03-09 DIAGNOSIS — S0511XS Contusion of eyeball and orbital tissues, right eye, sequela: Secondary | ICD-10-CM

## 2023-03-09 DIAGNOSIS — I255 Ischemic cardiomyopathy: Secondary | ICD-10-CM

## 2023-03-09 DIAGNOSIS — M25561 Pain in right knee: Secondary | ICD-10-CM

## 2023-03-09 DIAGNOSIS — Z043 Encounter for examination and observation following other accident: Secondary | ICD-10-CM | POA: Diagnosis not present

## 2023-03-09 DIAGNOSIS — S022XXA Fracture of nasal bones, initial encounter for closed fracture: Secondary | ICD-10-CM | POA: Insufficient documentation

## 2023-03-09 DIAGNOSIS — S61411A Laceration without foreign body of right hand, initial encounter: Secondary | ICD-10-CM

## 2023-03-09 DIAGNOSIS — M25552 Pain in left hip: Secondary | ICD-10-CM | POA: Diagnosis not present

## 2023-03-09 DIAGNOSIS — S0511XA Contusion of eyeball and orbital tissues, right eye, initial encounter: Secondary | ICD-10-CM

## 2023-03-09 DIAGNOSIS — W19XXXA Unspecified fall, initial encounter: Secondary | ICD-10-CM

## 2023-03-09 DIAGNOSIS — S022XXS Fracture of nasal bones, sequela: Secondary | ICD-10-CM

## 2023-03-09 DIAGNOSIS — I10 Essential (primary) hypertension: Secondary | ICD-10-CM

## 2023-03-09 DIAGNOSIS — N1831 Chronic kidney disease, stage 3a: Secondary | ICD-10-CM

## 2023-03-09 LAB — CBC WITH DIFFERENTIAL/PLATELET
Abs Immature Granulocytes: 0.04 10*3/uL (ref 0.00–0.07)
Basophils Absolute: 0 10*3/uL (ref 0.0–0.1)
Basophils Relative: 0 %
Eosinophils Absolute: 0 10*3/uL (ref 0.0–0.5)
Eosinophils Relative: 0 %
HCT: 37.5 % (ref 36.0–46.0)
Hemoglobin: 12.3 g/dL (ref 12.0–15.0)
Immature Granulocytes: 0 %
Lymphocytes Relative: 15 %
Lymphs Abs: 1.9 10*3/uL (ref 0.7–4.0)
MCH: 29.9 pg (ref 26.0–34.0)
MCHC: 32.8 g/dL (ref 30.0–36.0)
MCV: 91 fL (ref 80.0–100.0)
Monocytes Absolute: 1.2 10*3/uL — ABNORMAL HIGH (ref 0.1–1.0)
Monocytes Relative: 10 %
Neutro Abs: 9.4 10*3/uL — ABNORMAL HIGH (ref 1.7–7.7)
Neutrophils Relative %: 75 %
Platelets: 180 10*3/uL (ref 150–400)
RBC: 4.12 MIL/uL (ref 3.87–5.11)
RDW: 13.8 % (ref 11.5–15.5)
WBC: 12.6 10*3/uL — ABNORMAL HIGH (ref 4.0–10.5)
nRBC: 0 % (ref 0.0–0.2)

## 2023-03-09 LAB — CBC
HCT: 35.5 % — ABNORMAL LOW (ref 36.0–46.0)
Hemoglobin: 11.7 g/dL — ABNORMAL LOW (ref 12.0–15.0)
MCH: 30 pg (ref 26.0–34.0)
MCHC: 33 g/dL (ref 30.0–36.0)
MCV: 91 fL (ref 80.0–100.0)
Platelets: 158 10*3/uL (ref 150–400)
RBC: 3.9 MIL/uL (ref 3.87–5.11)
RDW: 14 % (ref 11.5–15.5)
WBC: 10 10*3/uL (ref 4.0–10.5)
nRBC: 0 % (ref 0.0–0.2)

## 2023-03-09 LAB — BASIC METABOLIC PANEL
Anion gap: 7 (ref 5–15)
BUN: 23 mg/dL (ref 8–23)
CO2: 25 mmol/L (ref 22–32)
Calcium: 8.7 mg/dL — ABNORMAL LOW (ref 8.9–10.3)
Chloride: 107 mmol/L (ref 98–111)
Creatinine, Ser: 1.02 mg/dL — ABNORMAL HIGH (ref 0.44–1.00)
GFR, Estimated: 54 mL/min — ABNORMAL LOW (ref >60)
Glucose, Bld: 119 mg/dL — ABNORMAL HIGH (ref 70–99)
Potassium: 3.4 mmol/L — ABNORMAL LOW (ref 3.5–5.1)
Sodium: 139 mmol/L (ref 135–145)

## 2023-03-09 MED ORDER — ONDANSETRON HCL 4 MG/2ML IJ SOLN: 4.0000 mg | Freq: Four times a day (QID) | INTRAMUSCULAR | Status: DC | PRN

## 2023-03-09 MED ORDER — BISOPROLOL-HYDROCHLOROTHIAZIDE 10-6.25 MG PO TABS
1.0000 | ORAL_TABLET | Freq: Every day | ORAL | Status: DC
  Filled 2023-03-09: qty 1

## 2023-03-09 MED ORDER — HYDROMORPHONE HCL 1 MG/ML IJ SOLN
0.5000 mg | INTRAMUSCULAR | Status: DC | PRN
  Administered 2023-03-09: 0.5 mg via INTRAVENOUS
  Filled 2023-03-09: qty 0.5

## 2023-03-09 MED ORDER — LOSARTAN POTASSIUM 50 MG PO TABS
100.0000 mg | ORAL_TABLET | Freq: Every day | ORAL | Status: DC
  Administered 2023-03-09: 100 mg via ORAL
  Filled 2023-03-09: qty 2

## 2023-03-09 MED ORDER — HYDROCODONE-ACETAMINOPHEN 5-325 MG PO TABS: 1.0000 | ORAL_TABLET | Freq: Four times a day (QID) | ORAL | 0 refills | Status: AC | PRN

## 2023-03-09 MED ORDER — ACETAMINOPHEN 650 MG RE SUPP: 650.0000 mg | Freq: Four times a day (QID) | RECTAL | Status: DC | PRN

## 2023-03-09 MED ORDER — ACETAMINOPHEN 325 MG PO TABS: 650.0000 mg | ORAL_TABLET | Freq: Four times a day (QID) | ORAL | Status: DC | PRN

## 2023-03-09 MED ORDER — ROPINIROLE HCL 1 MG PO TABS
0.5000 mg | ORAL_TABLET | Freq: Every day | ORAL | Status: DC
  Administered 2023-03-09: 0.5 mg via ORAL
  Filled 2023-03-09: qty 1
  Filled 2023-03-09: qty 2

## 2023-03-09 MED ORDER — HYDROCODONE-ACETAMINOPHEN 5-325 MG PO TABS
1.0000 | ORAL_TABLET | ORAL | Status: DC | PRN
  Administered 2023-03-09 (×2): 1 via ORAL
  Filled 2023-03-09 (×2): qty 1

## 2023-03-09 MED ORDER — CEPHALEXIN 500 MG PO CAPS
500.0000 mg | ORAL_CAPSULE | Freq: Three times a day (TID) | ORAL | Status: DC
  Administered 2023-03-09: 500 mg via ORAL
  Filled 2023-03-09: qty 1

## 2023-03-09 MED ORDER — BISOPROLOL-HYDROCHLOROTHIAZIDE 5-6.25 MG PO TABS
1.0000 | ORAL_TABLET | Freq: Every day | ORAL | Status: DC
  Administered 2023-03-09: 1 via ORAL
  Filled 2023-03-09: qty 1

## 2023-03-09 MED ORDER — CEPHALEXIN 500 MG PO CAPS: 500.0000 mg | ORAL_CAPSULE | Freq: Three times a day (TID) | ORAL | 0 refills | Status: AC

## 2023-03-09 MED ORDER — ONDANSETRON HCL 4 MG PO TABS: 4.0000 mg | ORAL_TABLET | Freq: Four times a day (QID) | ORAL | Status: DC | PRN

## 2023-03-09 MED ORDER — SODIUM CHLORIDE 0.9% FLUSH
3.0000 mL | Freq: Two times a day (BID) | INTRAVENOUS | Status: DC
  Administered 2023-03-09 (×2): 3 mL via INTRAVENOUS

## 2023-03-09 MED ORDER — HYDRALAZINE HCL 50 MG PO TABS
50.0000 mg | ORAL_TABLET | Freq: Three times a day (TID) | ORAL | Status: DC
  Administered 2023-03-09: 50 mg via ORAL
  Filled 2023-03-09: qty 1

## 2023-03-09 MED ORDER — ATORVASTATIN CALCIUM 10 MG PO TABS
10.0000 mg | ORAL_TABLET | Freq: Every day | ORAL | Status: DC
  Administered 2023-03-09: 10 mg via ORAL
  Filled 2023-03-09: qty 1

## 2023-03-09 MED ORDER — BISOPROLOL FUMARATE 5 MG PO TABS
5.0000 mg | ORAL_TABLET | Freq: Every day | ORAL | Status: DC
  Administered 2023-03-09: 5 mg via ORAL
  Filled 2023-03-09: qty 1

## 2023-03-09 NOTE — Evaluation (Signed)
Occupational Therapy Evaluation Patient Details Name: Julie Mcbride MRN: [id number] DOB: [birth year] Today's Date: 03/09/2023   History of Present Illness Julie Mcbride is an 86yoF who [facility] after a fall, impact to face. Pt arrives with abrasion and/or bleeding to nose, Rt forehead/periorbit, Rt hand. PMH Rt TKA (no fracture this admission, but effusion seen). Maxillofacial CT shows acute nasal fractures. Cervical spine CT report says "No acute fracture or traumatic subluxation of the cervical spine. Healed C2 fracture. C5-C7 anterior fusion plate appears uncomplicated."   Clinical Impression   Patient received for OT evaluation. See flowsheet below for details of function. Generally, patient MOD (I) for bed mobility, supervision with RW for functional mobility, and MIN A-SBA for ADLs. Patient will benefit from continued OT while in acute care.       Recommendations for follow up therapy are one component of a multi-disciplinary discharge planning process, led by the attending physician.  Recommendations may be updated based on patient status, additional functional criteria and insurance authorization.   Assistance Recommended at Discharge Intermittent Supervision/Assistance  Patient can return home with the following A little help with walking and/or transfers;A little help with bathing/dressing/bathroom;Assistance with cooking/housework;Direct supervision/assist for medications management;Direct supervision/assist for financial management;Assist for transportation;Help with stairs or ramp for entrance    Functional Status Assessment  Patient has had a recent decline in their functional status and demonstrates the ability to make significant improvements in function in a reasonable and predictable amount of time.  Equipment Recommendations  None recommended by OT    Recommendations for Other Services       Precautions / Restrictions Precautions Precautions:  Fall Restrictions Weight Bearing Restrictions: No      Mobility Bed Mobility Overal bed mobility: Modified Independent Bed Mobility: Supine to Sit     Supine to sit: HOB elevated, Modified independent (Device/Increase time)     General bed mobility comments: using bed rails and extra time    Transfers Overall transfer level: Needs assistance Equipment used: Rolling walker (2 wheels) Transfers: Sit to/from Stand Sit to Stand: Supervision (with extra effort)                  Balance Overall balance assessment: History of Falls, Mild deficits observed, not formally tested                                         ADL either performed or assessed with clinical judgement   ADL Overall ADL's : Needs assistance/impaired Eating/Feeding: Set up;Bed level Eating/Feeding Details (indicate cue type and reason): pt eating pre-chopped food at bed level when OT arrived. Grooming: Oral care;Set up;Supervision/safety;Standing Grooming Details (indicate cue type and reason): Pt with slight difficulty reaching the sink (leaning forward) 2/2 neck pain and not able to see adequate depth perception due to swollen shut R eye. OT taught strategies such as using small basin to spit in rather than sink, using cup at sink for rinsing. Pt having to feel for the toothbrush on her lips to know where it is to prepare for brushing. Able to use toothpaste without issue             Lower Body Dressing: Minimal assistance;Sit to/from stand Lower Body Dressing Details (indicate cue type and reason): Pt needing MIN A due to difficulty threading incontinence underwear at EOB; OT recommendation for use of reacher to improve functional performance;  pt ultimately able to make figure four position at EOB. Able to stand with RW and pull up at EOB.   Toilet Transfer Details (indicate cue type and reason): pt states she has been walking to the bathroom and toileting without issue with other  staff. OT provided recommendation to pt and husband for pt to use BSC next to bed at night for increased safety.       Tub/Shower Transfer Details (indicate cue type and reason): discussed avoiding showering until MD follow up on Monday due to packing in L nostril and safety concerns with visual deficit in the shower. Functional mobility during ADLs: Rolling walker (2 wheels);Supervision/safety;Set up General ADL Comments: Pt slow, but stedy with RW. Education provided on using RW in the home and community to be sure she doesn't run into objects with her body and to improve balance.     Vision         Perception     Praxis      Pertinent Vitals/Pain Pain Assessment Pain Assessment: 0-10 Pain Score:  (unrated) Pain Location: back of neck Pain Descriptors / Indicators: Aching Pain Intervention(s): Limited activity within patient's tolerance, Monitored during session     Hand Dominance Right   Extremity/Trunk Assessment Upper Extremity Assessment Upper Extremity Assessment: Generalized weakness;Overall WFL for tasks assessed   Lower Extremity Assessment Lower Extremity Assessment: Defer to PT evaluation       Communication Communication Communication: No difficulties   Cognition Arousal/Alertness: Awake/alert Behavior During Therapy: WFL for tasks assessed/performed Overall Cognitive Status: Within Functional Limits for tasks assessed                                 General Comments: Pt slightly irritated at tasks, self-limiting, and fatigued, but able to follow commands and ultimately did participate. Supportive husband at bedside.     General Comments  Pt fatigued at end of session. Able to return to supine in bed MOD (I).    Exercises     Shoulder Instructions      Home Living Family/patient expects to be discharged to:: Private residence Living Arrangements: Spouse/significant other Available Help at Discharge: Family;Available 24  hours/day Type of Home: House Home Access: Stairs to enter [employer] of Steps: 3 Entrance Stairs-Rails: Right Home Layout: One level     Bathroom Shower/Tub: Curtain;Walk-in shower   Bathroom Toilet: Handicapped height Bathroom Accessibility:  (states her bathroom is small)   Home Equipment: Rolling Walker (2 wheels);Grab bars - tub/shower;Grab bars - toilet;Rollator (4 wheels);Shower seat;Shower seat - built in;BSC/3in1   Additional Comments: Not using any DME prior to his over past 6 months, says no other recent balance/falls issues but does hold onto husband for support due to fear of falling; has been able to sleep in recliner previously after orthopedic surgeries and states she plans to stay in recliner at d/c.      Prior Functioning/Environment Prior Level of Function : Independent/Modified Independent;Driving             Mobility Comments: walks without AD but holds onto husband in the community ADLs Comments: limited distance walking, but otherwise (I) ADLs. Husband cooks.        OT Problem List: Decreased activity tolerance;Impaired balance (sitting and/or standing);Impaired vision/perception;Decreased safety awareness;Decreased strength      OT Treatment/Interventions: Self-care/ADL training;Therapeutic exercise;Therapeutic activities    OT Goals(Current goals can be found in the care plan section) Acute Rehab OT  Goals Patient Stated Goal: Get better; go home. OT Goal Formulation: With patient/family Time For Goal Achievement: 03/23/23 Potential to Achieve Goals: Good ADL Goals Pt Will Perform Lower Body Dressing: with modified independence;sit to/from stand Pt Will Perform Toileting - Clothing Manipulation and hygiene: with modified independence;sit to/from stand Pt Will Perform Tub/Shower Transfer: with modified independence;shower seat  OT Frequency: Min 1X/week    Co-evaluation              AM-PAC OT "6 Clicks" Daily Activity      Outcome Measure Help from another person eating meals?: A Little Help from another person taking care of personal grooming?: A Little Help from another person toileting, which includes using toliet, bedpan, or urinal?: A Little Help from another person bathing (including washing, rinsing, drying)?: A Little Help from another person to put on and taking off regular upper body clothing?: A Little Help from another person to put on and taking off regular lower body clothing?: A Little 6 Click Score: 18   End of Session Equipment Utilized During Treatment: Rolling walker (2 wheels) Nurse Communication: Mobility status  Activity Tolerance: Patient limited by fatigue Patient left: in bed;with call bell/phone within reach;with family/visitor present  OT Visit Diagnosis: History of falling (Z91.81)                Time: 9379-0240 OT Time Calculation (min): 25 min Charges:  OT General Charges $OT Visit: 1 Visit OT Evaluation $OT Eval Moderate Complexity: 1 Mod OT Treatments $Self Care/Home Management : 8-22 mins Linward Foster, MS, OTR/L  Alvester Morin 03/09/2023, 1:54 PM

## 2023-03-09 NOTE — Assessment & Plan Note (Addendum)
Nasal bone fracture  Facial contusion/periorbital contusion Accidental fall Patient is s/p Merocel packing left nare and had Afrin and tranexamic acid sprayed into the right nare Continue to hold Plavix until after packing removed and ENT can determine when to restart. No driving while right eye is swollen shut. Patient advised to return to the hospital if any persistent nausea vomiting or altered mental status.

## 2023-03-09 NOTE — Assessment & Plan Note (Signed)
Chronic kidney disease stage IIIa.  Creatinine 1.02 with a GFR of 54.

## 2023-03-09 NOTE — TOC Initial Note (Signed)
Transition of Care [facility]) - Initial/Assessment Note    Patient Details  Name: Julie Mcbride MRN: [id number] Date of Birth: [birth year]  [facility]) CM/SW Contact:    Chapman Fitch, RN Phone Number: 03/09/2023, 2:23 PM  Clinical Narrative:                    Patient to discharge today.  Husband at bedside to transport.  Discussed home health recommendations.  Patient and husband decline.  Patient to reach out to PCP if she changes her mind post discharge     Patient Goals and CMS Choice            Expected Discharge Plan and Services         Expected Discharge Date: 03/09/23                                    Prior Living Arrangements/Services                       Activities of Daily Living Home Assistive Devices/Equipment: None ADL Screening (condition at time of admission) Patient's cognitive ability adequate to safely complete daily activities?: Yes Is the patient deaf or have difficulty hearing?: Yes Does the patient have difficulty seeing, even when wearing glasses/contacts?: No Does the patient have difficulty concentrating, remembering, or making decisions?: No Patient able to express need for assistance with ADLs?: Yes Does the patient have difficulty dressing or bathing?: No Independently performs ADLs?: Yes (appropriate for developmental age) Does the patient have difficulty walking or climbing stairs?: Yes Weakness of Legs: None Weakness of Arms/Hands: None  Permission Sought/Granted                  Emotional Assessment              Admission diagnosis:  Epistaxis [R04.0] Fall, initial encounter [W19.XXXA] Open fracture of nasal bone, initial encounter [S02.2XXB] Epistaxis due to trauma [R04.0] Patient Active Problem List   Diagnosis Date Noted   Epistaxis due to trauma 03/09/2023   Laceration of right hand 03/09/2023   Accidental fall 03/09/2023   Facial contusion, initial encounter 03/09/2023    Fracture of nasal bones, initial encounter for closed fracture 03/09/2023   Acute pain of right knee, secondary to fall 03/09/2023   History of recurrent UTIs 02/16/2021   History of non-ST elevation myocardial infarction (NSTEMI) 01/01/2021   History of renal artery stenosis 01/01/2021   Ischemic cardiomyopathy 01/01/2021   Primary osteoarthritis of both hands 01/01/2021   Acute on chronic systolic CHF (congestive heart failure) 12/28/2020   COVID-19 12/25/2020   Sepsis 12/25/2020   Community acquired pneumonia 12/25/2020   Vitamin D deficiency 04/03/2020   Renovascular hypertension 04/03/2020   Renal artery stenosis 04/03/2020   CKD (chronic kidney disease) stage 3, GFR 30-59 ml/min (HCC) 03/18/2020   SHORTNESS OF BREATH 06/17/2009   Essential hypertension 05/27/2009   PALPITATIONS 05/27/2009   ABNORMAL EKG 05/27/2009   PCP:  Marisue Ivan, MD Pharmacy:   [facility], [facility] - [place], [place] - [address] [place] [address] Phone: [phone] Fax: [phone]  [facility] [id number] [place], [place] - [address] [place] [address] Phone: [phone] Fax: [phone]  [facility] - [place], [place] - [address] [place] [address] Phone: [phone] Fax: [phone]     Social Determinants of Health (SDOH) Social History: SDOH Screenings   Food Insecurity: No Food Insecurity (03/09/2023)  Housing: Low Risk  (03/09/2023)  Transportation Needs: No Transportation Needs (03/09/2023)  Utilities: Not At Risk (03/09/2023)  Tobacco Use: Low Risk  (03/08/2023)   SDOH Interventions:     Readmission Risk Interventions     No data to display

## 2023-03-09 NOTE — Assessment & Plan Note (Signed)
Continue home losartan, hydralazine and bisoprolol

## 2023-03-09 NOTE — Assessment & Plan Note (Addendum)
S/p suturing in the ED. Will need suture removal in 1 weeks.

## 2023-03-09 NOTE — H&P (Signed)
History and Physical    Patient: Julie Mcbride EAV:[id number] DOB: 1936/09/07 DOA: 03/08/2023 DOS: the patient was seen and examined on 03/09/2023 PCP: Marisue Ivan, MD  Patient coming from: Home  Chief Complaint:  Chief Complaint  Patient presents with   Fall    HPI: Julie Mcbride is a 87 y.o. female with medical history significant for CKD 3, systolic CHF secondary to ischemic cardiomyopathy, HTN who presents to the ED following an accidental fall when she missed stepped on the edge of a curb falling forward onto her right side hitting the right side of her face, right hand and right knee.  She had no loss of consciousness.Marland Kitchen  She was brought in by EMS and complained of pain in the face as well as the right hand and right knee.  She was having a nosebleed on arrival.  She was previously in her usual state of health. ED course and data review: On arrival BP was 200/87, improving on its own to 155/59 with other wise normal vitals.  Labs not done by admission.  Imaging included x-rays of the right hand, right knee and CT of the head, cervical spine and maxillofacial spine.  Pertinent findings included nasal bone fracture. Patient was treated with fentanyl for pain as well as morphine.  She underwent packing of the left nare and had tranexamic and Afrin sprayed into the right nare with adequate hemostasis, per ED provider. Right hand was sutured. Hospitalist consulted for admission for pain control and for monitoring of epistaxis from that patient is on Plavix and appeared to have mild trickling after intervention for nosebleed.   Review of Systems: As mentioned in the history of present illness. All other systems reviewed and are negative.  Past Medical History:  Diagnosis Date   Arthritis    Knees, hands   Basal cell carcinoma    back - removed   Bladder polyp    With previos hematuria   Diverticulitis    Dyspnea on exertion    Chronic   GERD (gastroesophageal reflux  disease)    History of ETT    Myoview 7/10 EF 78%, no ischemia or infarction. Poor exercise tolerance (2'30", stopped due to fatigue). Reached 86% MPHR.   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 Mortimer Fries   BACK SURGERY     BASAL CELL CARCINOMA EXCISION     back   BREAST BIOPSY Right 06/19/2017   Affirm Bx- Radial Scar   BREAST EXCISIONAL BIOPSY Right 1996   neg   BREAST LUMPECTOMY WITH NEEDLE LOCALIZATION Right 08/11/2017   Procedure: BREAST LUMPECTOMY WITH NEEDLE LOCALIZATION;  Surgeon: Nadeen Landau, MD;  Location: [facility] ORS;  Service: General;  Laterality: Right;   CARPAL TUNNEL RELEASE Right    CATARACT EXTRACTION W/ INTRAOCULAR LENS  IMPLANT, BILATERAL     EXCISION OF BREAST BIOPSY Right 08/11/2017   excision of radial Scar   EYE SURGERY     JOINT REPLACEMENT Right    knee   KNEE ARTHROSCOPY Right 04/22/2016   Procedure: ARTHROSCOPY KNEE;  Surgeon: Jake Shark  Gavin Potters, MD;  Location: [facility];  Service: Orthopedics;  Laterality: Right;   knee replacement Right 01/11/2017   NASAL SINUS SURGERY  06/05/14   Dr. Andee Poles   POSTERIOR CERVICAL LAMINECTOMY  2005   C5-7; plate and screws   RENAL ANGIOGRAPHY Left 04/20/2020   Procedure: RENAL ANGIOGRAPHY;  Surgeon: Annice Needy, MD;  Location: [facility] INVASIVE CV LAB;  Service: Cardiovascular;  Laterality: Left;   TONSILLECTOMY     Social History:  reports that she has never smoked. She has never used smokeless tobacco. She reports that she does not drink alcohol and does not use drugs.  Allergies  Allergen Reactions   Chlorhexidine Hives    SAGE wipes caused severe rash   Cyclobenzaprine Hcl      Doesn't remember reaction   Etodolac     Doesn't remember reaction   Lisinopril     Coughing up blood   Meloxicam     Doesn't remember reaction   Metaxalone     Doesn't remember reaction   Neomycin-Bacitracin Zn-Polymyx     Skin irritation   Nifedipine Swelling   Nitrofurantoin     Doesn't remember reaction   Sulfamethoxazole-Trimethoprim Hives   Sulfonamide Derivatives Hives   Tizanidine     Liver problems   Vasotec [Enalapril Maleate]     Doesn't remember reaction   Ibuprofen Rash   Tape Rash    Family History  Problem Relation Age of Onset   Ovarian cancer Mother    Stroke Father        CVA   Breast cancer Daughter 22   Breast cancer Maternal Aunt 31   Breast cancer Maternal Aunt 90   Prostate cancer Neg Hx    Bladder Cancer Neg Hx    Kidney cancer Neg Hx     Prior to Admission medications   Medication Sig Start Date End Date Taking? Authorizing Provider  atorvastatin (LIPITOR) 10 MG tablet Take 1 tablet (10 mg total) by mouth daily. 04/20/20 03/08/23 Yes Dew, Marlow Baars, MD  bisoprolol-hydrochlorothiazide [facility]) 10-6.25 MG per tablet Take 1 tablet by mouth daily.   Yes [provider]  cetirizine (ZYRTEC) 10 MG tablet Take 10 mg by mouth daily.   Yes [provider]  Cholecalciferol (VITAMIN D) 50 MCG (2000 UT) tablet Take 4,000 Units by mouth daily.    Yes [provider]  clopidogrel (PLAVIX) 75 MG tablet Take 1 tablet (75 mg total) by mouth daily. 04/20/20  Yes Annice Needy, MD  conjugated estrogens (PREMARIN) vaginal cream Estrogen Cream Instruction Discard applicator Apply pea sized amount to tip of finger to urethra before bed. Wash hands well after application. Use Monday, Wednesday and Friday 12/15/22  Yes McGowan, Carollee Herter A, PA-C  Cranberry 500 MG CAPS Take 1 capsule by mouth daily.   Yes [provider]  fluticasone (FLONASE) 50 MCG/ACT nasal spray Place 2 sprays into both nostrils as needed for allergies or rhinitis.   Yes  [provider]  hydrALAZINE (APRESOLINE) 50 MG tablet Take 50 mg by mouth 3 (three) times daily.    Yes [provider]  losartan (COZAAR) 100 MG tablet Take 100 mg by mouth daily. 06/17/21  Yes [provider]  Multiple Vitamin (MULTIVITAMIN) capsule Take 2 capsules by mouth daily. [facility] MVI   Yes [provider]  rOPINIRole (REQUIP) 0.25 MG tablet Take 0.5 mg by mouth at bedtime.   Yes [provider]  UNABLE TO FIND Herbal Supplement   Yes [provider]    Physical Exam: Vitals:   03/08/23 2200 03/08/23 2230 03/08/23 2300 03/08/23 2330  BP: (!) 170/72 (!) 158/63 (!) 155/62 (!) 155/59  Pulse: 75 73 73 72  Resp: 15 16 12 14   Temp:      TempSrc:      SpO2: 97% 97% 95% 94%  Weight:      Height:       Physical Exam Vitals and nursing note reviewed.  Constitutional:      General: She is not in acute distress. HENT:     Head: Normocephalic. Contusion and right periorbital erythema present.     Nose:     Right Nostril: Epistaxis present.     Comments: Merocel packing left nare Cardiovascular:     Rate and Rhythm: Normal rate and regular rhythm.     Heart sounds: Normal heart sounds.  Pulmonary:     Effort: Pulmonary effort is normal.     Breath sounds: Normal breath sounds.  Abdominal:     Palpations: Abdomen is soft.     Tenderness: There is no abdominal tenderness.  Musculoskeletal:     Comments: Bandage right hand Bruising over right knee  Neurological:     Mental Status: Mental status is at baseline.     Labs on Admission: I have personally reviewed following labs and imaging studies  CBC: Recent Labs  Lab 03/09/23 0000  WBC 12.6*  NEUTROABS 9.4*  HGB 12.3  HCT 37.5  MCV 91.0  PLT 180   Basic Metabolic Panel: No results for input(s): "NA", "K", "CL", "CO2", "GLUCOSE", "BUN", "CREATININE", "CALCIUM", "MG", "PHOS" in the last 168 hours. GFR: CrCl cannot be calculated (Patient's most recent lab result is  older than the maximum 21 days allowed.). Liver Function Tests: No results for input(s): "AST", "ALT", "ALKPHOS", "BILITOT", "PROT", "ALBUMIN" in the last 168 hours. No results for input(s): "LIPASE", "AMYLASE" in the last 168 hours. No results for input(s): "AMMONIA" in the last 168 hours. Coagulation Profile: No results for input(s): "INR", "PROTIME" in the last 168 hours. Cardiac Enzymes: No results for input(s): "CKTOTAL", "CKMB", "CKMBINDEX", "TROPONINI" in the last 168 hours. BNP (last 3 results) No results for input(s): "PROBNP" in the last 8760 hours. HbA1C: No results for input(s): "HGBA1C" in the last 72 hours. CBG: No results for input(s): "GLUCAP" in the last 168 hours. Lipid Profile: No results for input(s): "CHOL", "HDL", "LDLCALC", "TRIG", "CHOLHDL", "LDLDIRECT" in the last 72 hours. Thyroid Function Tests: No results for input(s): "TSH", "T4TOTAL", "FREET4", "T3FREE", "THYROIDAB" in the last 72 hours. Anemia Panel: No results for input(s): "VITAMINB12", "FOLATE", "FERRITIN", "TIBC", "IRON", "RETICCTPCT" in the last 72 hours. Urine analysis:    Component Value Date/Time   COLORURINE YELLOW (A) 12/25/2020 0615   APPEARANCEUR Hazy (A) 08/18/2021 1452   LABSPEC 1.014 12/25/2020 0615   LABSPEC 1.021 09/08/2013 0127   PHURINE 5.0 12/25/2020 0615   GLUCOSEU Negative 08/18/2021 1452   GLUCOSEU Negative 09/08/2013 0127   HGBUR MODERATE (A) 12/25/2020 0615   BILIRUBINUR Negative 08/18/2021 1452   BILIRUBINUR Negative 09/08/2013 0127   KETONESUR NEGATIVE 12/25/2020 0615   PROTEINUR Negative 08/18/2021 1452   PROTEINUR 30 (A) 12/25/2020 0615   NITRITE Negative 08/18/2021 1452   NITRITE POSITIVE (A) 12/25/2020 0615   LEUKOCYTESUR 1+ (A) 08/18/2021 1452   LEUKOCYTESUR MODERATE (A) 12/25/2020 0615   LEUKOCYTESUR Negative 09/08/2013 0127    Radiological Exams on Admission: DG Hand Complete Right  Result Date: 03/09/2023 CLINICAL DATA:  Hand pain EXAM:  RIGHT HAND -  COMPLETE 3+ VIEW COMPARISON:  None Available. FINDINGS: No fracture or malalignment. Moderate arthritis at the first [facility]. Moderate second through fifth D IP joint arthritis. Soft tissues are unremarkable IMPRESSION: No acute osseous abnormality. Electronically Signed   By: Jasmine PangKim  Fujinaga M.D.   On: 03/09/2023 00:13   DG Knee Complete 4 Views Right  Result Date: 03/08/2023 CLINICAL DATA:  Status post fall. EXAM: RIGHT KNEE - COMPLETE 4+ VIEW COMPARISON:  None Available. FINDINGS: No evidence of an acute fracture or dislocation. A right knee replacement is seen without evidence of surrounding lucency to suggest the presence of hardware loosening or infection. There is mild to moderate severity infrapatellar soft tissue swelling. A small joint effusion is noted. IMPRESSION: 1. Right knee replacement without evidence of hardware loosening or infection. 2. Small joint effusion. Electronically Signed   By: Aram Candelahaddeus  Houston M.D.   On: 03/08/2023 18:22   CT Cervical Spine Wo Contrast  Result Date: 03/08/2023 CLINICAL DATA:  Trauma, fall. EXAM: CT CERVICAL SPINE WITHOUT CONTRAST TECHNIQUE: Multidetector CT imaging of the cervical spine was performed without intravenous contrast. Multiplanar CT image reconstructions were also generated. RADIATION DOSE REDUCTION: This exam was performed according to the departmental dose-optimization program which includes automated exposure control, adjustment of the mA and/or kV according to patient size and/or use of iterative reconstruction technique. COMPARISON:  Cervical spine CT 11/27/2011 FINDINGS: Alignment: Normal. Skull base and vertebrae: No acute fractures are seen. Healed C2 fracture identified. C5, C6, C7 anterior fusion plate present which appears uncomplicated. Soft tissues and spinal canal: No prevertebral fluid or swelling. No visible canal hematoma. Disc levels: Disc spaces are preserved. Bilateral facet arthropathy present in the upper cervical region most  significant at C2-C3 and C3-C4. There is mild left neural foraminal stenosis at C3-C4. No significant central canal stenosis at any level. Upper chest: Negative. Other: There is a 9 mm hypodense right thyroid nodule. IMPRESSION: 1. No acute fracture or traumatic subluxation of the cervical spine. 2. Healed C2 fracture. 3. C5-C7 anterior fusion plate appears uncomplicated. 4. Subcentimeter incidental right thyroid nodule. No follow-up imaging is recommended. Reference: J Am Coll Radiol. 2015 Feb;12(2): 143-50 Electronically Signed   By: Darliss CheneyAmy  Guttmann M.D.   On: 03/08/2023 18:20   CT Head Wo Contrast  Result Date: 03/08/2023 CLINICAL DATA:  Blunt facial trauma. Tripped and fell. Anticoagulated. EXAM: CT HEAD WITHOUT CONTRAST CT MAXILLOFACIAL WITHOUT CONTRAST TECHNIQUE: Multidetector CT imaging of the head and maxillofacial structures were performed using the standard protocol without intravenous contrast. Multiplanar CT image reconstructions of the maxillofacial structures were also generated. RADIATION DOSE REDUCTION: This exam was performed according to the departmental dose-optimization program which includes automated exposure control, adjustment of the mA and/or kV according to patient size and/or use of iterative reconstruction technique. COMPARISON:  11/27/2011 FINDINGS: CT HEAD FINDINGS Brain: Age related atrophy. Chronic small-vessel ischemic change of the white matter. No sign of acute infarction, mass lesion, intracranial hemorrhage, hydrocephalus or extra-axial collection. Vascular: There is atherosclerotic calcification of the major vessels at the base of the brain. Skull: No skull fracture. Other: None CT MAXILLOFACIAL FINDINGS Osseous: Comminuted nasal fractures displaced slightly towards the left. No other facial fracture. Chronic degenerative disease of the temporomandibular joints incidentally noted. Orbits: Pronounced right facial and superficial periorbital soft tissue swelling/hematoma. No  evidence of injury to the globe. No postseptal orbital bleeding. Postseptal orbital fat is normal. Optic nerves and extraocular muscles are normal. No orbital wall fracture either medial or  inferior. Sinuses: No traumatic fluid in the sinuses. Distant FESS on the right. IMPRESSION: HEAD CT: No acute or traumatic intracranial finding. Age related atrophy and chronic small-vessel ischemic change of the white matter. MAXILLOFACIAL CT: Comminuted nasal fractures displaced slightly towards the left. No other facial fracture. Pronounced right facial and superficial periorbital soft tissue swelling/hematoma. No evidence of injury to the globe or postseptal orbital bleeding. Electronically Signed   By: Paulina Fusi M.D.   On: 03/08/2023 18:19   CT Maxillofacial Wo Contrast  Result Date: 03/08/2023 CLINICAL DATA:  Blunt facial trauma. Tripped and fell. Anticoagulated. EXAM: CT HEAD WITHOUT CONTRAST CT MAXILLOFACIAL WITHOUT CONTRAST TECHNIQUE: Multidetector CT imaging of the head and maxillofacial structures were performed using the standard protocol without intravenous contrast. Multiplanar CT image reconstructions of the maxillofacial structures were also generated. RADIATION DOSE REDUCTION: This exam was performed according to the departmental dose-optimization program which includes automated exposure control, adjustment of the mA and/or kV according to patient size and/or use of iterative reconstruction technique. COMPARISON:  11/27/2011 FINDINGS: CT HEAD FINDINGS Brain: Age related atrophy. Chronic small-vessel ischemic change of the white matter. No sign of acute infarction, mass lesion, intracranial hemorrhage, hydrocephalus or extra-axial collection. Vascular: There is atherosclerotic calcification of the major vessels at the base of the brain. Skull: No skull fracture. Other: None CT MAXILLOFACIAL FINDINGS Osseous: Comminuted nasal fractures displaced slightly towards the left. No other facial fracture. Chronic  degenerative disease of the temporomandibular joints incidentally noted. Orbits: Pronounced right facial and superficial periorbital soft tissue swelling/hematoma. No evidence of injury to the globe. No postseptal orbital bleeding. Postseptal orbital fat is normal. Optic nerves and extraocular muscles are normal. No orbital wall fracture either medial or inferior. Sinuses: No traumatic fluid in the sinuses. Distant FESS on the right. IMPRESSION: HEAD CT: No acute or traumatic intracranial finding. Age related atrophy and chronic small-vessel ischemic change of the white matter. MAXILLOFACIAL CT: Comminuted nasal fractures displaced slightly towards the left. No other facial fracture. Pronounced right facial and superficial periorbital soft tissue swelling/hematoma. No evidence of injury to the globe or postseptal orbital bleeding. Electronically Signed   By: Paulina Fusi M.D.   On: 03/08/2023 18:19     Data Reviewed: Relevant notes from primary care and specialist visits, past discharge summaries as available in EHR, including Care Everywhere. Prior diagnostic testing as pertinent to current admission diagnoses Updated medications and problem lists for reconciliation ED course, including vitals, labs, imaging, treatment and response to treatment Triage notes, nursing and pharmacy notes and ED provider's notes Notable results as noted in HPI   Assessment and Plan: * Epistaxis due to trauma Nasal bone fracture  Facial contusion/periorbital contusion Accidental fall Patient is s/p Merocel packing left nare and had Afrin and tranexamic acid sprayed into the right nare Observe overnight for any additional bleeding Monitor H&H Hold Plavix Pain control Cool compresses to contusion to assist with pain control  Acute pain of right knee, secondary to fall Pain control PT eval  Laceration of right hand S/p suturing in the ED Wound care  Ischemic cardiomyopathy Continue losartan and bisoprolol  HCTZ and atorvastatin  Essential hypertension Continue home losartan, hydralazine and bisoprolol        DVT prophylaxis: SCD  Consults: none  Advance Care Planning:   Code Status: Prior   Family Communication: husband at bedside  Disposition Plan: Back to previous home environment  Severity of Illness: The appropriate patient status for this patient is OBSERVATION. Observation status is judged  to be reasonable and necessary in order to provide the required intensity of service to ensure the patient's safety. The patient's presenting symptoms, physical exam findings, and initial radiographic and laboratory data in the context of their medical condition is felt to place them at decreased risk for further clinical deterioration. Furthermore, it is anticipated that the patient will be medically stable for discharge from the hospital within 2 midnights of admission.   Author: Andris Baumann, MD 03/09/2023 12:19 AM  For on call review [email or web address].

## 2023-03-09 NOTE — Assessment & Plan Note (Addendum)
PT recommended home health.

## 2023-03-09 NOTE — Discharge Instructions (Signed)
NO driving while eye is swollen shut Any nausea vomiting or altered mental status then return to ER

## 2023-03-09 NOTE — Evaluation (Signed)
Physical Therapy Evaluation Patient Details Name: Julie Mcbride MRN: [id number] DOB: 09-Jan-1936 Today's Date: 03/09/2023  History of Present Illness  Julie Mcbride is an 86yoF who comes to [facility] on 03/08/23 after a fall, impact to face. Pt arrives with abrasion and/or bleeding to nose, Rt forehead/periorbit, Rt hand. PMH Rt TKA (no fracture this admission, but effusion seen). Maxillofacial CT shows acute nasal fractures. Cervical spine CT report says "No acute fracture or traumatic subluxation of the cervical spine. Healed C2 fracture. C5-C7 anterior fusion plate appears uncomplicated."  Clinical Impression  Pt in bed on entry, husband helping with breakfast setup, author exits/returns post meal. Pt able to demonstrate bed mobility, transfers and short distance AMB with RW in room, no pain limitations and no additional physical assist needed, however pt has mild imbalance at baseline and moderate weakness in legs, both of which are evident today and both of which are less confident today given recent events and insult to face. Pt feels she can safely navigate the household in her current state, will need to be extra careful given lack of depth perception. Pt decline further distance AMB due to being quite tired- she is anxious to have doctor remove dressing from inside her left nare.      Recommendations for follow up therapy are one component of a multi-disciplinary discharge planning process, led by the attending physician.  Recommendations may be updated based on patient status, additional functional criteria and insurance authorization.  Follow Up Recommendations       Assistance Recommended at Discharge Set up Supervision/Assistance  Patient can return home with the following  A little help with walking and/or transfers;A little help with bathing/dressing/bathroom;Help with stairs or ramp for entrance;Assist for transportation    Equipment Recommendations None recommended by PT   Recommendations for Other Services       Functional Status Assessment Patient has had a recent decline in their functional status and demonstrates the ability to make significant improvements in function in a reasonable and predictable amount of time.     Precautions / Restrictions Restrictions Weight Bearing Restrictions: No      Mobility  Bed Mobility Overal bed mobility: Modified Independent Bed Mobility: Supine to Sit     Supine to sit: HOB elevated, Modified independent (Device/Increase time)          Transfers Overall transfer level: Needs assistance Equipment used: Rolling walker (2 wheels) Transfers: Sit to/from Stand Sit to Stand: Min guard           General transfer comment: very labored, similar to baseline, requires optimal posturing and near-max effort.    Ambulation/Gait Ambulation/Gait assistance: Supervision Gait Distance (Feet): 60 Feet Assistive device: Rolling walker (2 wheels) Gait Pattern/deviations: Step-to pattern       General Gait Details: using RW for confidence; monocular vision and recent tauma has dimished confidence, however pt denies any acute progress in imbalance or loss of strength.  Stairs            Wheelchair Mobility    Modified Rankin (Stroke Patients Only)       Balance Overall balance assessment: History of Falls, Mild deficits observed, not formally tested                                           Pertinent Vitals/Pain Pain Assessment Pain Assessment: No/denies pain Pain Score: 0-No pain  Home Living Family/patient expects to be discharged to:: Private residence Living Arrangements: Spouse/significant other Available Help at Discharge: Family;Available 24 hours/day Type of Home: House Home Access: Stairs to enter Entrance Stairs-Rails: Right Entrance Stairs-Number of Steps: 3   Home Layout: One level Home Equipment: Rolling Walker (2 wheels);Grab bars - tub/shower;Grab bars  - toilet;Rollator (4 wheels);Shower seat;Shower seat - built in;BSC/3in1 Additional Comments: Not using any DME prior to his over past 6 months, says no other recent balance/falls issues but does hold onto husband for support due to fear of falling; has been able to sleep in recliner previously after orthopedic surgeries.    Prior Function Prior Level of Function : Independent/Modified Independent;Driving               ADLs Comments: limited with long distance walking, gets SOB     Hand Dominance        Extremity/Trunk Assessment                Communication      Cognition                                                General Comments      Exercises     Assessment/Plan    PT Assessment Patient needs continued PT services  PT Problem List Decreased range of motion;Decreased activity tolerance;Decreased balance;Decreased mobility       PT Treatment Interventions DME instruction;Gait training;Functional mobility training;Therapeutic activities;Therapeutic exercise;Balance training    PT Goals (Current goals can be found in the Care Plan section)  Acute Rehab PT Goals Patient Stated Goal: return to home PT Goal Formulation: With patient Time For Goal Achievement: 03/23/23 Potential to Achieve Goals: Fair    Frequency Min 2X/week     Co-evaluation               AM-PAC PT "6 Clicks" Mobility  Outcome Measure Help needed turning from your back to your side while in a flat bed without using bedrails?: None Help needed moving from lying on your back to sitting on the side of a flat bed without using bedrails?: None Help needed moving to and from a bed to a chair (including a wheelchair)?: A Little Help needed standing up from a chair using your arms (e.g., wheelchair or bedside chair)?: A Little Help needed to walk in hospital room?: A Little Help needed climbing 3-5 steps with a railing? : A Lot 6 Click Score: 19    End of  Session   Activity Tolerance: Patient limited by fatigue;Patient tolerated treatment well Patient left: in chair;with call bell/phone within reach;with family/visitor present Nurse Communication: Mobility status PT Visit Diagnosis: Unsteadiness on feet (R26.81);Difficulty in walking, not elsewhere classified (R26.2)    Time: 3614-4315 PT Time Calculation (min) (ACUTE ONLY): 37 min   Charges:   PT Evaluation $PT Eval Moderate Complexity: 1 Mod PT Treatments $Therapeutic Activity: 8-22 mins       11:56 AM, 03/09/23 Rosamaria Lints, PT, DPT Physical Therapist - [facility]  [phone] (ASCOM)    Khamiya Varin C 03/09/2023, 11:52 AM

## 2023-03-09 NOTE — Assessment & Plan Note (Signed)
Continue losartan and bisoprolol HCTZ and atorvastatin

## 2023-03-09 NOTE — Discharge Summary (Signed)
Physician Discharge Summary   Patient: Julie Mcbride MRN: [id number] DOB: Jan 04, 1936  Admit date:     03/08/2023  Discharge date: 03/09/23  Discharge Physician: Alford Highland   PCP: Marisue Ivan, MD   Recommendations at discharge:   Follow-up PCP 1 week to remove sutures Follow-up ENT Dr. Andee Poles on Monday at 10:15 AM to remove nasal packing Patient advised to return to emergency room if any persistent nausea vomiting or mental status change.  Discharge Diagnoses: Principal Problem:   Epistaxis due to trauma Active Problems:   Fracture of nasal bones, initial encounter for closed fracture   Facial contusion, initial encounter   Accidental fall   Acute pain of right knee, secondary to fall   Laceration of right hand   CKD (chronic kidney disease) stage 3, GFR 30-59 ml/min (HCC)   Essential hypertension   Ischemic cardiomyopathy    Hospital Course: 87 y.o. female with medical history significant for CKD 3, systolic CHF secondary to ischemic cardiomyopathy, HTN who presents to the ED following an accidental fall when she missed stepped on the edge of a curb falling forward onto her right side hitting the right side of her face, right hand and right knee.  She had no loss of consciousness.Marland Kitchen  She was brought in by EMS and complained of pain in the face as well as the right hand and right knee.  She was having a nosebleed on arrival.  She was previously in her usual state of health. ED course and data review: On arrival BP was 200/87, improving on its own to 155/59 with other wise normal vitals.  Labs not done by admission.  Imaging included x-rays of the right hand, right knee and CT of the head, cervical spine and maxillofacial spine.  Pertinent findings included nasal bone fracture. Patient was treated with fentanyl for pain as well as morphine.  She underwent packing of the left nare and had tranexamic and Afrin sprayed into the right nare with adequate hemostasis, per ED  provider. Right hand was sutured. Hospitalist consulted for admission for pain control and for monitoring of epistaxis from that patient is on Plavix and appeared to have mild trickling after intervention for nosebleed.   4/11.  Patient feels okay and interested in going home.  She does have a little pain from her fall.  Her right eye is swollen shut.  Does have some nasal fractures.  Consulted Dr. Andee Poles because the nasal packing was hanging down out of her nose and interfering with her eating.  Dr. Andee Poles was able to shorten the nasal packing.  Patient prescribed Keflex empirically for 1 week with the nasal packing.  Patient did well with physical therapy.  She stated she was going into southbound sandwiches and ended up tripping and falling.  Plavix will be held until packing removed and ENT evaluates for any signs of bleeding.  The patient did not want to stay in the hospital any further for observation.  She was advised to return to the ER if any persistent nausea vomiting or altered mental status.  Assessment and Plan: * Epistaxis due to trauma Nasal bone fracture  Facial contusion/periorbital contusion Accidental fall Patient is s/p Merocel packing left nare and had Afrin and tranexamic acid sprayed into the right nare Continue to hold Plavix until after packing removed and ENT can determine when to restart. No driving while right eye is swollen shut. Patient advised to return to the hospital if any persistent nausea vomiting or altered mental  status.  Acute pain of right knee, secondary to fall PT recommended home health.  Laceration of right hand S/p suturing in the ED. Will need suture removal in 1 weeks.  CKD (chronic kidney disease) stage 3, GFR 30-59 ml/min (HCC) Chronic kidney disease stage IIIa.  Creatinine 1.02 with a GFR of 54.  Ischemic cardiomyopathy Continue losartan and bisoprolol HCTZ and atorvastatin  Essential hypertension Continue home losartan, hydralazine and  bisoprolol         Consultants: ENT Procedures performed: ER physician placed nasal packing.  ENT shortened the nasal packing. Disposition: Home health Diet recommendation:  Cardiac diet DISCHARGE MEDICATION: Allergies as of 03/09/2023       Reactions   Chlorhexidine Hives   SAGE wipes caused severe rash   Cyclobenzaprine Hcl    Doesn't remember reaction   Etodolac    Doesn't remember reaction   Lisinopril    Coughing up blood   Meloxicam    Doesn't remember reaction   Metaxalone    Doesn't remember reaction   Neomycin-bacitracin Zn-polymyx    Skin irritation   Nifedipine Swelling   Nitrofurantoin    Doesn't remember reaction   Sulfamethoxazole-trimethoprim Hives   Sulfonamide Derivatives Hives   Tizanidine    Liver problems   Vasotec [enalapril Maleate]    Doesn't remember reaction   Ibuprofen Rash   Tape Rash        Medication List     STOP taking these medications    clopidogrel 75 MG tablet Commonly known as: Plavix   UNABLE TO FIND       TAKE these medications    atorvastatin 10 MG tablet Commonly known as: Lipitor Take 1 tablet (10 mg total) by mouth daily.   bisoprolol-hydrochlorothiazide 10-6.25 MG tablet Commonly known as: ZIAC Take 1 tablet by mouth daily.   cephALEXin 500 MG capsule Commonly known as: KEFLEX Take 1 capsule (500 mg total) by mouth every 8 (eight) hours for 7 days.   cetirizine 10 MG tablet Commonly known as: ZYRTEC Take 10 mg by mouth daily.   Cranberry 500 MG Caps Take 1 capsule by mouth daily.   fluticasone 50 MCG/ACT nasal spray Commonly known as: FLONASE Place 2 sprays into both nostrils as needed for allergies or rhinitis.   hydrALAZINE 50 MG tablet Commonly known as: APRESOLINE Take 50 mg by mouth 3 (three) times daily.   HYDROcodone-acetaminophen 5-325 MG tablet Commonly known as: NORCO/VICODIN Take 1 tablet by mouth every 6 (six) hours as needed for up to 3 days for moderate pain.   losartan  100 MG tablet Commonly known as: COZAAR Take 100 mg by mouth daily.   multivitamin capsule Take 2 capsules by mouth daily. [facility]   Premarin vaginal cream Generic drug: conjugated estrogens Estrogen Cream Instruction Discard applicator Apply pea sized amount to tip of finger to urethra before bed. Wash hands well after application. Use Monday, Wednesday and Friday   rOPINIRole 0.25 MG tablet Commonly known as: REQUIP Take 0.5 mg by mouth at bedtime.   Vitamin D 50 MCG (2000 UT) tablet Take 4,000 Units by mouth daily.        Follow-up Information     Marisue Ivan, MD. Go in 1 week(s).   Specialty: Family Medicine Why: 03/15/23 at 2:00 PM  will need to remove sutures Contact information: [address] [facility] [place] [address] [phone]         Bud Face, MD Follow up.   Specialty: Otolaryngology Why:  10:15 am on 03/13/23 Contact information: [address] [place] [address] [phone]                Discharge Exam: Ceasar MonsFiled Weights   03/08/23 1722 03/09/23 0144  Weight: 102 kg 101.6 kg   Physical Exam HENT:     Head: Normocephalic.     Nose:     Comments: Nasal packing left nostril.    Mouth/Throat:     Pharynx: No oropharyngeal exudate.  Eyes:     Comments: Right upper and lower eyelid swollen.  Right eye swollen shut.  I was able to open the right eyelids and noticed the lateral right I did have some hemorrhage but the medial right eye did not have any hemorrhage.  Patient was able to move her eye side-to-side.  Cardiovascular:     Rate and Rhythm: Normal rate and regular rhythm.     Heart sounds: Normal heart sounds, S1 normal and S2 normal.  Pulmonary:     Breath sounds: No decreased breath sounds, wheezing, rhonchi or rales.  Abdominal:     Palpations: Abdomen is soft.     Tenderness: There is no abdominal tenderness.  Musculoskeletal:     Right lower leg: Swelling present.      Left lower leg: Swelling present.  Skin:    General: Skin is warm.     Comments: Right eye swollen shut.  Right facial contusion and bruising.  Right hand covered.  Neurological:     Mental Status: She is alert and oriented to person, place, and time.      Condition at discharge: fair  The results of significant diagnostics from this hospitalization (including imaging, microbiology, ancillary and laboratory) are listed below for reference.   Imaging Studies: DG HIPS BILAT WITH PELVIS MIN 5 VIEWS  Result Date: 03/09/2023 CLINICAL DATA:  Recent fall with hip pain, initial encounter EXAM: DG HIP (WITH OR WITHOUT PELVIS) 5+V BILAT COMPARISON:  None Available. FINDINGS: Pelvic ring is intact. Sacral ala are within normal limits. No acute fracture or dislocation is seen. No soft tissue abnormality is noted. IMPRESSION: No acute abnormality noted. Electronically Signed   By: Alcide CleverMark  Lukens M.D.   On: 03/09/2023 01:19   X-ray chest PA and lateral  Result Date: 03/09/2023 CLINICAL DATA:  Recent fall EXAM: CHEST - 2 VIEW COMPARISON:  12/25/2020 FINDINGS: The heart size and mediastinal contours are within normal limits. Both lungs are clear. The visualized skeletal structures are unremarkable. IMPRESSION: No active cardiopulmonary disease. Electronically Signed   By: Alcide CleverMark  Lukens M.D.   On: 03/09/2023 01:16   DG Hand Complete Right  Result Date: 03/09/2023 CLINICAL DATA:  Hand pain EXAM: RIGHT HAND - COMPLETE 3+ VIEW COMPARISON:  None Available. FINDINGS: No fracture or malalignment. Moderate arthritis at the [facility]. Moderate second through fifth D IP joint arthritis. Soft tissues are unremarkable IMPRESSION: No acute osseous abnormality. Electronically Signed   By: Jasmine PangKim  Fujinaga M.D.   On: 03/09/2023 00:13   DG Knee Complete 4 Views Right  Result Date: 03/08/2023 CLINICAL DATA:  Status post fall. EXAM: RIGHT KNEE - COMPLETE 4+ VIEW COMPARISON:  None Available. FINDINGS: No evidence of  an acute fracture or dislocation. A right knee replacement is seen without evidence of surrounding lucency to suggest the presence of hardware loosening or infection. There is mild to moderate severity infrapatellar soft tissue swelling. A small joint effusion is noted. IMPRESSION: 1. Right knee replacement without evidence of hardware loosening  or infection. 2. Small joint effusion. Electronically Signed   By: Aram Candela M.D.   On: 03/08/2023 18:22   CT Cervical Spine Wo Contrast  Result Date: 03/08/2023 CLINICAL DATA:  Trauma, fall. EXAM: CT CERVICAL SPINE WITHOUT CONTRAST TECHNIQUE: Multidetector CT imaging of the cervical spine was performed without intravenous contrast. Multiplanar CT image reconstructions were also generated. RADIATION DOSE REDUCTION: This exam was performed according to the departmental dose-optimization program which includes automated exposure control, adjustment of the mA and/or kV according to patient size and/or use of iterative reconstruction technique. COMPARISON:  Cervical spine CT 11/27/2011 FINDINGS: Alignment: Normal. Skull base and vertebrae: No acute fractures are seen. Healed C2 fracture identified. C5, C6, C7 anterior fusion plate present which appears uncomplicated. Soft tissues and spinal canal: No prevertebral fluid or swelling. No visible canal hematoma. Disc levels: Disc spaces are preserved. Bilateral facet arthropathy present in the upper cervical region most significant at C2-C3 and C3-C4. There is mild left neural foraminal stenosis at C3-C4. No significant central canal stenosis at any level. Upper chest: Negative. Other: There is a 9 mm hypodense right thyroid nodule. IMPRESSION: 1. No acute fracture or traumatic subluxation of the cervical spine. 2. Healed C2 fracture. 3. C5-C7 anterior fusion plate appears uncomplicated. 4. Subcentimeter incidental right thyroid nodule. No follow-up imaging is recommended. Reference: J Am Coll Radiol. 2015 Feb;12(2):  143-50 Electronically Signed   By: Darliss Cheney M.D.   On: 03/08/2023 18:20   CT Head Wo Contrast  Result Date: 03/08/2023 CLINICAL DATA:  Blunt facial trauma. Tripped and fell. Anticoagulated. EXAM: CT HEAD WITHOUT CONTRAST CT MAXILLOFACIAL WITHOUT CONTRAST TECHNIQUE: Multidetector CT imaging of the head and maxillofacial structures were performed using the standard protocol without intravenous contrast. Multiplanar CT image reconstructions of the maxillofacial structures were also generated. RADIATION DOSE REDUCTION: This exam was performed according to the departmental dose-optimization program which includes automated exposure control, adjustment of the mA and/or kV according to patient size and/or use of iterative reconstruction technique. COMPARISON:  11/27/2011 FINDINGS: CT HEAD FINDINGS Brain: Age related atrophy. Chronic small-vessel ischemic change of the white matter. No sign of acute infarction, mass lesion, intracranial hemorrhage, hydrocephalus or extra-axial collection. Vascular: There is atherosclerotic calcification of the major vessels at the base of the brain. Skull: No skull fracture. Other: None CT MAXILLOFACIAL FINDINGS Osseous: Comminuted nasal fractures displaced slightly towards the left. No other facial fracture. Chronic degenerative disease of the temporomandibular joints incidentally noted. Orbits: Pronounced right facial and superficial periorbital soft tissue swelling/hematoma. No evidence of injury to the globe. No postseptal orbital bleeding. Postseptal orbital fat is normal. Optic nerves and extraocular muscles are normal. No orbital wall fracture either medial or inferior. Sinuses: No traumatic fluid in the sinuses. Distant FESS on the right. IMPRESSION: HEAD CT: No acute or traumatic intracranial finding. Age related atrophy and chronic small-vessel ischemic change of the white matter. MAXILLOFACIAL CT: Comminuted nasal fractures displaced slightly towards the left. No other  facial fracture. Pronounced right facial and superficial periorbital soft tissue swelling/hematoma. No evidence of injury to the globe or postseptal orbital bleeding. Electronically Signed   By: Paulina Fusi M.D.   On: 03/08/2023 18:19   CT Maxillofacial Wo Contrast  Result Date: 03/08/2023 CLINICAL DATA:  Blunt facial trauma. Tripped and fell. Anticoagulated. EXAM: CT HEAD WITHOUT CONTRAST CT MAXILLOFACIAL WITHOUT CONTRAST TECHNIQUE: Multidetector CT imaging of the head and maxillofacial structures were performed using the standard protocol without intravenous contrast. Multiplanar CT image reconstructions of the maxillofacial structures  were also generated. RADIATION DOSE REDUCTION: This exam was performed according to the departmental dose-optimization program which includes automated exposure control, adjustment of the mA and/or kV according to patient size and/or use of iterative reconstruction technique. COMPARISON:  11/27/2011 FINDINGS: CT HEAD FINDINGS Brain: Age related atrophy. Chronic small-vessel ischemic change of the white matter. No sign of acute infarction, mass lesion, intracranial hemorrhage, hydrocephalus or extra-axial collection. Vascular: There is atherosclerotic calcification of the major vessels at the base of the brain. Skull: No skull fracture. Other: None CT MAXILLOFACIAL FINDINGS Osseous: Comminuted nasal fractures displaced slightly towards the left. No other facial fracture. Chronic degenerative disease of the temporomandibular joints incidentally noted. Orbits: Pronounced right facial and superficial periorbital soft tissue swelling/hematoma. No evidence of injury to the globe. No postseptal orbital bleeding. Postseptal orbital fat is normal. Optic nerves and extraocular muscles are normal. No orbital wall fracture either medial or inferior. Sinuses: No traumatic fluid in the sinuses. Distant FESS on the right. IMPRESSION: HEAD CT: No acute or traumatic intracranial finding. Age  related atrophy and chronic small-vessel ischemic change of the white matter. MAXILLOFACIAL CT: Comminuted nasal fractures displaced slightly towards the left. No other facial fracture. Pronounced right facial and superficial periorbital soft tissue swelling/hematoma. No evidence of injury to the globe or postseptal orbital bleeding. Electronically Signed   By: Paulina Fusi M.D.   On: 03/08/2023 18:19   VAS US RENAL ARTERY DUPLEX  Result Date: 02/20/2023 ABDOMINAL VISCERAL Patient Name:  RASHAAN HORSFIELD  Date of Exam:   02/17/2023 Medical Rec #: [id number]         Accession #:    [id number] Date of Birth: 08-07-1936        Patient Gender: F Patient Age:   70 years Exam Location:  [facility] Procedure:      VAS US RENAL ARTERY DUPLEX Referring Phys: [id number] Marlow Baars DEW -------------------------------------------------------------------------------- Vascular Interventions: Left renal artery stent on 04/20/20. Comparison Study: 01/2022 Performing Technologist: Salvadore Farber RVT  Examination Guidelines: A complete evaluation includes B-mode imaging, spectral Doppler, color Doppler, and power Doppler as needed of all accessible portions of each vessel. Bilateral testing is considered an integral part of a complete examination. Limited examinations for reoccurring indications may be performed as noted.  Duplex Findings: +----------+--------+--------+------+--------+ MesentericPSV cm/sEDV cm/sPlaqueComments +----------+--------+--------+------+--------+ Aorta Mid    95                          +----------+--------+--------+------+--------+    +------------------+--------+--------+-------+ Right Renal ArteryPSV cm/sEDV cm/sComment +------------------+--------+--------+-------+ Proximal             77                   +------------------+--------+--------+-------+ Mid                  95                   +------------------+--------+--------+-------+ Distal               94                    +------------------+--------+--------+-------+ +-----------------+--------+--------+-------+ Left Renal ArteryPSV cm/sEDV cm/sComment +-----------------+--------+--------+-------+ Proximal            51                   +-----------------+--------+--------+-------+ Mid  57                   +-----------------+--------+--------+-------+ Distal              45                   +-----------------+--------+--------+-------+ +------------+--------+--------+----+-----------+--------+--------+----+ Right KidneyPSV cm/sEDV cm/sRI  Left KidneyPSV cm/sEDV cm/sRI   +------------+--------+--------+----+-----------+--------+--------+----+ Upper Pole                      Upper Pole                      +------------+--------+--------+----+-----------+--------+--------+----+ Mid         23      7       0.        30      11      0.64 +------------+--------+--------+----+-----------+--------+--------+----+ Lower Pole                      Lower Pole                      +------------+--------+--------+----+-----------+--------+--------+----+ Hilar                           Hilar                           +------------+--------+--------+----+-----------+--------+--------+----+ +------------------+----+------------------+----+ Right Kidney          Left Kidney            +------------------+----+------------------+----+ RAR                   RAR                    +------------------+----+------------------+----+ RAR (manual)      1.00RAR (manual)      .6   +------------------+----+------------------+----+ Cortex                Cortex                 +------------------+----+------------------+----+ Cortex thickness      Corex thickness        +------------------+----+------------------+----+ Kidney length (cm)9.34Kidney length (cm)8.85 +------------------+----+------------------+----+  Summary: Renal:   Right: Normal size right kidney. Normal right Resisitive Index.        Normal cortical thickness of right kidney. RRV flow present.        1-59% stenosis of the right renal artery. Left:  Normal size of left kidney. Normal left Resistive Index.        Normal cortical thickness of the left kidney. 1-59% stenosis        of the left renal artery. Left RA stent appears to be patent        with no evidence of significant stenosis.  *See table(s) above for measurements and observations.  Diagnosing physician: Festus Barren MD  Electronically signed by Festus Barren MD on 02/20/2023 at 8:40:24 AM.    Final     Microbiology: Results for orders placed or performed in visit on 08/18/21  Microscopic Examination     Status: None   Collection Time: 08/18/21  2:52 PM   Urine  Result Value Ref Range Status   WBC, UA 0-5 0 - 5 /hpf Final   RBC, Urine 0-2 0 - 2 /hpf Final   Epithelial Cells (non  renal) 0-10 0 - 10 /hpf Final   Bacteria, UA None seen None seen/Few Final    Labs: CBC: Recent Labs  Lab 03/09/23 0000 03/09/23 0546  WBC 12.6* 10.0  NEUTROABS 9.4*  --   HGB 12.3 11.7*  HCT 37.5 35.5*  MCV 91.0 91.0  PLT 180 158   Basic Metabolic Panel: Recent Labs  Lab 03/09/23 0000  NA 139  K 3.4*  CL 107  CO2 25  GLUCOSE 119*  BUN 23  CREATININE 1.02*  CALCIUM 8.7*     Discharge time spent: greater than 30 minutes.  Signed: Alford Highland, MD [facility] 03/09/2023

## 2023-03-09 NOTE — Hospital Course (Addendum)
87 y.o. female with medical history significant for CKD 3, systolic CHF secondary to ischemic cardiomyopathy, HTN who presents to the ED following an accidental fall when she missed stepped on the edge of a curb falling forward onto her right side hitting the right side of her face, right hand and right knee.  She had no loss of consciousness.Marland Kitchen  She was brought in by EMS and complained of pain in the face as well as the right hand and right knee.  She was having a nosebleed on arrival.  She was previously in her usual state of health. ED course and data review: On arrival BP was 200/87, improving on its own to 155/59 with other wise normal vitals.  Labs not done by admission.  Imaging included x-rays of the right hand, right knee and CT of the head, cervical spine and maxillofacial spine.  Pertinent findings included nasal bone fracture. Patient was treated with fentanyl for pain as well as morphine.  She underwent packing of the left nare and had tranexamic and Afrin sprayed into the right nare with adequate hemostasis, per ED provider. Right hand was sutured. Hospitalist consulted for admission for pain control and for monitoring of epistaxis from that patient is on Plavix and appeared to have mild trickling after intervention for nosebleed.   4/11.  Patient feels okay and interested in going home.  She does have a little pain from her fall.  Her right eye is swollen shut.  Does have some nasal fractures.  Consulted Dr. Andee Poles because the nasal packing was hanging down out of her nose and interfering with her eating.  Dr. Andee Poles was able to shorten the nasal packing.  Patient prescribed Keflex empirically for 1 week with the nasal packing.  Patient did well with physical therapy.  She stated she was going into southbound sandwiches and ended up tripping and falling.  Plavix will be held until packing removed and ENT evaluates for any signs of bleeding.  The patient did not want to stay in the hospital any  further for observation.  She was advised to return to the ER if any persistent nausea vomiting or altered mental status.

## 2023-03-13 DIAGNOSIS — S022XXA Fracture of nasal bones, initial encounter for closed fracture: Secondary | ICD-10-CM | POA: Diagnosis not present

## 2023-03-13 DIAGNOSIS — R040 Epistaxis: Secondary | ICD-10-CM | POA: Diagnosis not present

## 2023-03-15 DIAGNOSIS — M25561 Pain in right knee: Secondary | ICD-10-CM | POA: Diagnosis not present

## 2023-03-15 DIAGNOSIS — I255 Ischemic cardiomyopathy: Secondary | ICD-10-CM | POA: Diagnosis not present

## 2023-03-15 DIAGNOSIS — I10 Essential (primary) hypertension: Secondary | ICD-10-CM | POA: Diagnosis not present

## 2023-03-15 DIAGNOSIS — I739 Peripheral vascular disease, unspecified: Secondary | ICD-10-CM | POA: Diagnosis not present

## 2023-03-15 DIAGNOSIS — Z8781 Personal history of (healed) traumatic fracture: Secondary | ICD-10-CM | POA: Diagnosis not present

## 2023-03-15 DIAGNOSIS — S61411D Laceration without foreign body of right hand, subsequent encounter: Secondary | ICD-10-CM | POA: Diagnosis not present

## 2023-03-15 DIAGNOSIS — Z09 Encounter for follow-up examination after completed treatment for conditions other than malignant neoplasm: Secondary | ICD-10-CM | POA: Diagnosis not present

## 2023-03-15 DIAGNOSIS — N1831 Chronic kidney disease, stage 3a: Secondary | ICD-10-CM | POA: Diagnosis not present

## 2023-03-20 DIAGNOSIS — R040 Epistaxis: Secondary | ICD-10-CM | POA: Diagnosis not present

## 2023-03-20 DIAGNOSIS — L7622 Postprocedural hemorrhage and hematoma of skin and subcutaneous tissue following other procedure: Secondary | ICD-10-CM | POA: Diagnosis not present

## 2023-03-20 DIAGNOSIS — I739 Peripheral vascular disease, unspecified: Secondary | ICD-10-CM | POA: Diagnosis not present

## 2023-03-20 DIAGNOSIS — I255 Ischemic cardiomyopathy: Secondary | ICD-10-CM | POA: Diagnosis not present

## 2023-03-20 DIAGNOSIS — S022XXA Fracture of nasal bones, initial encounter for closed fracture: Secondary | ICD-10-CM | POA: Diagnosis not present

## 2023-03-20 DIAGNOSIS — R600 Localized edema: Secondary | ICD-10-CM | POA: Diagnosis not present

## 2023-03-20 DIAGNOSIS — S61411D Laceration without foreign body of right hand, subsequent encounter: Secondary | ICD-10-CM | POA: Diagnosis not present

## 2023-03-20 DIAGNOSIS — Z4802 Encounter for removal of sutures: Secondary | ICD-10-CM | POA: Diagnosis not present

## 2023-03-20 DIAGNOSIS — N1831 Chronic kidney disease, stage 3a: Secondary | ICD-10-CM | POA: Diagnosis not present

## 2023-04-18 DIAGNOSIS — L7622 Postprocedural hemorrhage and hematoma of skin and subcutaneous tissue following other procedure: Secondary | ICD-10-CM | POA: Diagnosis not present

## 2023-04-18 DIAGNOSIS — S022XXA Fracture of nasal bones, initial encounter for closed fracture: Secondary | ICD-10-CM | POA: Diagnosis not present

## 2023-04-18 DIAGNOSIS — H9313 Tinnitus, bilateral: Secondary | ICD-10-CM | POA: Diagnosis not present

## 2023-04-18 DIAGNOSIS — H6123 Impacted cerumen, bilateral: Secondary | ICD-10-CM | POA: Diagnosis not present

## 2023-05-29 DIAGNOSIS — M9904 Segmental and somatic dysfunction of sacral region: Secondary | ICD-10-CM | POA: Diagnosis not present

## 2023-05-29 DIAGNOSIS — M9901 Segmental and somatic dysfunction of cervical region: Secondary | ICD-10-CM | POA: Diagnosis not present

## 2023-05-29 DIAGNOSIS — M9903 Segmental and somatic dysfunction of lumbar region: Secondary | ICD-10-CM | POA: Diagnosis not present

## 2023-05-29 DIAGNOSIS — M5451 Vertebrogenic low back pain: Secondary | ICD-10-CM | POA: Diagnosis not present

## 2023-05-30 DIAGNOSIS — I130 Hypertensive heart and chronic kidney disease with heart failure and stage 1 through stage 4 chronic kidney disease, or unspecified chronic kidney disease: Secondary | ICD-10-CM | POA: Diagnosis not present

## 2023-05-30 DIAGNOSIS — D692 Other nonthrombocytopenic purpura: Secondary | ICD-10-CM | POA: Diagnosis not present

## 2023-05-30 DIAGNOSIS — Z7902 Long term (current) use of antithrombotics/antiplatelets: Secondary | ICD-10-CM | POA: Diagnosis not present

## 2023-05-30 DIAGNOSIS — N1831 Chronic kidney disease, stage 3a: Secondary | ICD-10-CM | POA: Diagnosis not present

## 2023-05-30 DIAGNOSIS — I5022 Chronic systolic (congestive) heart failure: Secondary | ICD-10-CM | POA: Diagnosis not present

## 2023-05-30 DIAGNOSIS — S8011XD Contusion of right lower leg, subsequent encounter: Secondary | ICD-10-CM | POA: Diagnosis not present

## 2023-05-30 DIAGNOSIS — Z85828 Personal history of other malignant neoplasm of skin: Secondary | ICD-10-CM | POA: Diagnosis not present

## 2023-05-30 DIAGNOSIS — I739 Peripheral vascular disease, unspecified: Secondary | ICD-10-CM | POA: Diagnosis not present

## 2023-05-30 DIAGNOSIS — Z6835 Body mass index (BMI) 35.0-35.9, adult: Secondary | ICD-10-CM | POA: Diagnosis not present

## 2023-06-14 DIAGNOSIS — Z96651 Presence of right artificial knee joint: Secondary | ICD-10-CM | POA: Diagnosis not present

## 2023-06-14 DIAGNOSIS — M25561 Pain in right knee: Secondary | ICD-10-CM | POA: Diagnosis not present

## 2023-06-14 DIAGNOSIS — R600 Localized edema: Secondary | ICD-10-CM | POA: Diagnosis not present

## 2023-06-14 DIAGNOSIS — M25562 Pain in left knee: Secondary | ICD-10-CM | POA: Diagnosis not present

## 2023-06-21 ENCOUNTER — Other Ambulatory Visit: Payer: Self-pay | Admitting: Physician Assistant

## 2023-06-21 ENCOUNTER — Ambulatory Visit
Admission: RE | Admit: 2023-06-21 | Discharge: 2023-06-21 | Disposition: A | Discharge status: Home / Self Care | Payer: PPO | Source: Ambulatory Visit | Attending: Physician Assistant | Admitting: Physician Assistant

## 2023-06-21 DIAGNOSIS — M79605 Pain in left leg: Secondary | ICD-10-CM | POA: Diagnosis not present

## 2023-06-21 DIAGNOSIS — M545 Low back pain, unspecified: Secondary | ICD-10-CM | POA: Diagnosis not present

## 2023-06-21 DIAGNOSIS — M79604 Pain in right leg: Secondary | ICD-10-CM | POA: Insufficient documentation

## 2023-06-21 DIAGNOSIS — M7989 Other specified soft tissue disorders: Secondary | ICD-10-CM | POA: Diagnosis not present

## 2023-07-03 DIAGNOSIS — M9903 Segmental and somatic dysfunction of lumbar region: Secondary | ICD-10-CM | POA: Diagnosis not present

## 2023-07-03 DIAGNOSIS — M9901 Segmental and somatic dysfunction of cervical region: Secondary | ICD-10-CM | POA: Diagnosis not present

## 2023-07-03 DIAGNOSIS — M9904 Segmental and somatic dysfunction of sacral region: Secondary | ICD-10-CM | POA: Diagnosis not present

## 2023-07-03 DIAGNOSIS — M5451 Vertebrogenic low back pain: Secondary | ICD-10-CM | POA: Diagnosis not present

## 2023-07-27 DIAGNOSIS — S61212A Laceration without foreign body of right middle finger without damage to nail, initial encounter: Secondary | ICD-10-CM | POA: Diagnosis not present

## 2023-07-27 DIAGNOSIS — W010XXA Fall on same level from slipping, tripping and stumbling without subsequent striking against object, initial encounter: Secondary | ICD-10-CM | POA: Diagnosis not present

## 2023-07-27 DIAGNOSIS — Z23 Encounter for immunization: Secondary | ICD-10-CM | POA: Diagnosis not present

## 2023-07-27 DIAGNOSIS — S6991XA Unspecified injury of right wrist, hand and finger(s), initial encounter: Secondary | ICD-10-CM | POA: Diagnosis not present

## 2023-07-27 DIAGNOSIS — S59901A Unspecified injury of right elbow, initial encounter: Secondary | ICD-10-CM | POA: Diagnosis not present

## 2023-07-27 DIAGNOSIS — Y92009 Unspecified place in unspecified non-institutional (private) residence as the place of occurrence of the external cause: Secondary | ICD-10-CM | POA: Diagnosis not present

## 2023-08-15 DIAGNOSIS — I255 Ischemic cardiomyopathy: Secondary | ICD-10-CM | POA: Diagnosis not present

## 2023-08-15 DIAGNOSIS — I10 Essential (primary) hypertension: Secondary | ICD-10-CM | POA: Diagnosis not present

## 2023-08-22 DIAGNOSIS — I255 Ischemic cardiomyopathy: Secondary | ICD-10-CM | POA: Diagnosis not present

## 2023-08-22 DIAGNOSIS — I10 Essential (primary) hypertension: Secondary | ICD-10-CM | POA: Diagnosis not present

## 2023-08-22 DIAGNOSIS — I252 Old myocardial infarction: Secondary | ICD-10-CM | POA: Diagnosis not present

## 2023-08-22 DIAGNOSIS — E6609 Other obesity due to excess calories: Secondary | ICD-10-CM | POA: Diagnosis not present

## 2023-08-22 DIAGNOSIS — Z0000 Encounter for general adult medical examination without abnormal findings: Secondary | ICD-10-CM | POA: Diagnosis not present

## 2023-08-22 DIAGNOSIS — N1832 Chronic kidney disease, stage 3b: Secondary | ICD-10-CM | POA: Diagnosis not present

## 2023-08-25 DIAGNOSIS — Z872 Personal history of diseases of the skin and subcutaneous tissue: Secondary | ICD-10-CM | POA: Diagnosis not present

## 2023-08-25 DIAGNOSIS — Z85828 Personal history of other malignant neoplasm of skin: Secondary | ICD-10-CM | POA: Diagnosis not present

## 2023-08-25 DIAGNOSIS — L821 Other seborrheic keratosis: Secondary | ICD-10-CM | POA: Diagnosis not present

## 2023-08-25 DIAGNOSIS — D2262 Melanocytic nevi of left upper limb, including shoulder: Secondary | ICD-10-CM | POA: Diagnosis not present

## 2023-08-25 DIAGNOSIS — D2261 Melanocytic nevi of right upper limb, including shoulder: Secondary | ICD-10-CM | POA: Diagnosis not present

## 2023-08-25 DIAGNOSIS — D2272 Melanocytic nevi of left lower limb, including hip: Secondary | ICD-10-CM | POA: Diagnosis not present

## 2023-08-25 DIAGNOSIS — D225 Melanocytic nevi of trunk: Secondary | ICD-10-CM | POA: Diagnosis not present

## 2023-08-25 DIAGNOSIS — D2271 Melanocytic nevi of right lower limb, including hip: Secondary | ICD-10-CM | POA: Diagnosis not present

## 2023-08-25 DIAGNOSIS — L400 Psoriasis vulgaris: Secondary | ICD-10-CM | POA: Diagnosis not present

## 2023-09-04 DIAGNOSIS — M9901 Segmental and somatic dysfunction of cervical region: Secondary | ICD-10-CM | POA: Diagnosis not present

## 2023-09-04 DIAGNOSIS — M9904 Segmental and somatic dysfunction of sacral region: Secondary | ICD-10-CM | POA: Diagnosis not present

## 2023-09-04 DIAGNOSIS — M9903 Segmental and somatic dysfunction of lumbar region: Secondary | ICD-10-CM | POA: Diagnosis not present

## 2023-09-04 DIAGNOSIS — M5451 Vertebrogenic low back pain: Secondary | ICD-10-CM | POA: Diagnosis not present

## 2023-12-18 NOTE — Progress Notes (Unsigned)
12/19/2023 1:01 PM   Julie Mcbride 1936/07/29 [id number]  Referring provider: Marisue Ivan, MD [id number] [facility] [address] [facility] [place],  [place] [address]  Urological history: 1. rUTI's  -contributing factors of age, vaginal atrophy, incontinence and diuretics -documented urine cultures over the last year  None -vaginal estrogen cream    2. OAB -contributing factors of age, vaginal atrophy and diuretics   -cysto 2022-NED -intolerable side effects with anticholinergics -failed Gemtesa 75 mg daily -managed with depends  Chief Complaint  Patient presents with   Follow-up    HPI: Julie Mcbride is a 88 y.o. female who presents today for one year follow up.  Previous records reviewed.     She is having 8 or more daytime voids, nocturia x 3 or more with a mild urge to urinate.  She does not have urinary leakage she wears 1 depends daily.  She does engage in toilet mapping.  Patient denies any modifying or aggravating factors.  Patient denies any recent UTI's, gross hematuria, dysuria or suprapubic/flank pain.  Patient denies any fevers, chills, nausea or vomiting.    She keeps herself well-hydrated during the day drinking only water.  PMH: Past Medical History:  Diagnosis Date   Arthritis    Knees, hands   Basal cell carcinoma    back - removed   Bladder polyp    With previos hematuria   Diverticulitis    Dyspnea on exertion    Chronic   GERD (gastroesophageal reflux disease)    History of ETT    Myoview 7/10 EF 78%, no ischemia or infarction. Poor exercise tolerance (2'30", stopped due to fatigue). Reached 86% MPHR.   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 Mortimer Fries   BACK SURGERY     BASAL CELL CARCINOMA EXCISION     back   BREAST BIOPSY Right 06/19/2017   Affirm Bx- Radial Scar   BREAST EXCISIONAL BIOPSY Right 1996   neg   BREAST LUMPECTOMY WITH NEEDLE LOCALIZATION Right 08/11/2017   Procedure: BREAST LUMPECTOMY WITH NEEDLE LOCALIZATION;  Surgeon: Nadeen Landau, MD;  Location: [facility];  Service: General;  Laterality: Right;   CARPAL TUNNEL RELEASE Right    CATARACT EXTRACTION W/ INTRAOCULAR LENS  IMPLANT, BILATERAL     EXCISION OF BREAST BIOPSY Right 08/11/2017   excision of radial Scar   EYE SURGERY     JOINT REPLACEMENT Right    knee   KNEE ARTHROSCOPY Right 04/22/2016   Procedure: ARTHROSCOPY KNEE;  Surgeon: Erin Sons, MD;  Location: [facility];  Service: Orthopedics;  Laterality: Right;   knee replacement Right 01/11/2017   NASAL SINUS SURGERY  06/05/14   Dr. Andee Poles   POSTERIOR CERVICAL LAMINECTOMY  2005   C5-7; plate and screws   RENAL ANGIOGRAPHY Left 04/20/2020   Procedure: RENAL ANGIOGRAPHY;  Surgeon: Annice Needy, MD;  Location: [facility];  Service: Cardiovascular;  Laterality: Left;   TONSILLECTOMY      Home Medications:  Allergies as of 12/19/2023  Reactions   Chlorhexidine Hives   SAGE wipes caused severe rash   Cyclobenzaprine Hcl    Doesn't remember reaction   Etodolac    Doesn't remember reaction   Lisinopril    Coughing up blood   Meloxicam    Doesn't remember reaction   Metaxalone    Doesn't remember reaction   Neomycin-bacitracin Zn-polymyx    Skin irritation   Nifedipine Swelling   Nitrofurantoin    Doesn't remember reaction   Sulfamethoxazole-trimethoprim Hives   Sulfonamide Derivatives Hives   Tizanidine    Liver problems   Vasotec [enalapril Maleate]    Doesn't remember reaction   Ibuprofen Rash   Tape Rash        Medication List        Accurate as of December 19, 2023  1:01 PM. If  you have any questions, ask your nurse or doctor.          atorvastatin 10 MG tablet Commonly known as: Lipitor Take 1 tablet (10 mg total) by mouth daily.   bisoprolol-hydrochlorothiazide 10-6.25 MG tablet Commonly known as: ZIAC Take 1 tablet by mouth daily.   cetirizine 10 MG tablet Commonly known as: ZYRTEC Take 10 mg by mouth daily.   Cranberry 500 MG Caps Take 1 capsule by mouth daily.   fluticasone 50 MCG/ACT nasal spray Commonly known as: FLONASE Place 2 sprays into both nostrils as needed for allergies or rhinitis.   hydrALAZINE 50 MG tablet Commonly known as: APRESOLINE Take 50 mg by mouth 3 (three) times daily.   losartan 100 MG tablet Commonly known as: COZAAR Take 100 mg by mouth daily.   multivitamin capsule Take 2 capsules by mouth daily. GNC MVI   Premarin vaginal cream Generic drug: conjugated estrogens Estrogen Cream Instruction Discard applicator Apply pea sized amount to tip of finger to urethra before bed. Wash hands well after application. Use Monday, Wednesday and Friday   rOPINIRole 0.25 MG tablet Commonly known as: REQUIP Take 0.5 mg by mouth at bedtime.   Vitamin D 50 MCG (2000 UT) tablet Take 4,000 Units by mouth daily.        Allergies:  Allergies  Allergen Reactions   Chlorhexidine Hives    SAGE wipes caused severe rash   Cyclobenzaprine Hcl     Doesn't remember reaction   Etodolac     Doesn't remember reaction   Lisinopril     Coughing up blood   Meloxicam     Doesn't remember reaction   Metaxalone     Doesn't remember reaction   Neomycin-Bacitracin Zn-Polymyx     Skin irritation   Nifedipine Swelling   Nitrofurantoin     Doesn't remember reaction   Sulfamethoxazole-Trimethoprim Hives   Sulfonamide Derivatives Hives   Tizanidine     Liver problems   Vasotec [Enalapril Maleate]     Doesn't remember reaction   Ibuprofen Rash   Tape Rash    Family History: Family History  Problem Relation Age of Onset    Ovarian cancer Mother    Stroke Father        CVA   Breast cancer Daughter 94   Breast cancer Maternal Aunt 82   Breast cancer Maternal Aunt 90   Prostate cancer Neg Hx    Bladder Cancer Neg Hx    Kidney cancer Neg Hx     Social History:  reports that she has never smoked. She has never used smokeless tobacco. She reports that she does not drink alcohol and does not  use drugs.  ROS: Pertinent ROS in HPI  Physical Exam: BP (!) 184/73   Pulse 62   Ht 5\' 6"  (1.676 m)   Wt 220 lb 12.8 oz (100.2 kg)   BMI 35.64 kg/m   Constitutional:  Well nourished. Alert and oriented, No acute distress. HEENT: [place] AT, moist mucus membranes.  Trachea midline, no masses. Cardiovascular: No clubbing, cyanosis, or edema. Respiratory: Normal respiratory effort, no increased work of breathing. Neurologic: Grossly intact, no focal deficits, moving all 4 extremities. Psychiatric: Normal mood and affect.    Laboratory Data: Comprehensive Metabolic Panel (CMP) Order: [id number] Component Ref Range & Units 4 mo ago  Glucose 70 - 110 mg/dL 952  Sodium 841 - 324 mmol/L 143  Potassium 3.6 - 5.1 mmol/L 4  Chloride 97 - 109 mmol/L 105  Carbon Dioxide (CO2) 22.0 - 32.0 mmol/L 29.4  Urea Nitrogen (BUN) 7 - 25 mg/dL 20  Creatinine 0.6 - 1.1 mg/dL 1.3 High   Glomerular Filtration Rate (eGFR) >60 mL/min/1.73sq m 41 Low   Comment: CKD-EPI (2021) does not include patient's race in the calculation of eGFR.  Monitoring changes of plasma creatinine and eGFR over time is useful for monitoring kidney function.  Interpretive Ranges for eGFR (CKD-EPI 2021):  eGFR:       >60 mL/min/1.73 sq. m - Normal eGFR:       30-59 mL/min/1.73 sq. m - Moderately Decreased eGFR:       15-29 mL/min/1.73 sq. m  - Severely Decreased eGFR:       < 15 mL/min/1.73 sq. m  - Kidney Failure   Note: These eGFR calculations do not apply in acute situations when eGFR is changing rapidly or patients on dialysis.  Calcium 8.7 - 10.3  mg/dL 9.5  AST 8 - 39 U/L 20  ALT 5 - 38 U/L 17  Alk Phos (alkaline Phosphatase) 34 - 104 U/L 43  Albumin 3.5 - 4.8 g/dL 3.7  Bilirubin, Total 0.3 - 1.2 mg/dL 0.6  Protein, Total 6.1 - 7.9 g/dL 5.6 Low   A/G Ratio 1.0 - 5.0 gm/dL 1.9  Resulting Agency [facility]   Specimen Collected: 08/15/23 07:16   Performed by: [facility] Last Resulted: 08/15/23 10:14  Received From: [facility]  Result Received: 12/18/23 10:39   CBC w/auto Differential (5 Part) Order: [id number] Component Ref Range & Units 4 mo ago  WBC (White Blood Cell Count) 4.1 - 10.2 10^3/uL 6.7  RBC (Red Blood Cell Count) 4.04 - 5.48 10^6/uL 3.9 Low   Hemoglobin 12.0 - 15.0 gm/dL 66.4 Low   Hematocrit 40.3 - 47.0 % 36  MCV (Mean Corpuscular Volume) 80.0 - 100.0 fl 92.3  MCH (Mean Corpuscular Hemoglobin) 27.0 - 31.2 pg 29.7  MCHC (Mean Corpuscular Hemoglobin Concentration) 32.0 - 36.0 gm/dL 47.4  Platelet Count 259 - 450 10^3/uL 195  RDW-CV (Red Cell Distribution Width) 11.6 - 14.8 % 14.9 High   MPV (Mean Platelet Volume) 9.4 - 12.4 fl 12.1  Neutrophils 1.50 - 7.80 10^3/uL 3.58  Lymphocytes 1.00 - 3.60 10^3/uL 2.12  Monocytes 0.00 - 1.50 10^3/uL 0.74  Eosinophils 0.00 - 0.55 10^3/uL 0.23  Basophils 0.00 - 0.09 10^3/uL 0.04  Neutrophil % 32.0 - 70.0 % 53.4  Lymphocyte % 10.0 - 50.0 % 31.5  Monocyte % 4.0 - 13.0 % 11  Eosinophil % 1.0 - 5.0 % 3.4  Basophil% 0.0 - 2.0 % 0.6  Immature Granulocyte % <=0.7 % 0.1  Immature  Granulocyte Count <=0.06 10^3/L 0.01  Resulting Agency [facility]   Specimen Collected: 08/15/23 07:16   Performed by: [facility] Last Resulted: 08/15/23 11:21  Received From: [facility]  Result Received: 12/18/23 10:39  I have reviewed the labs.   Pertinent Imaging: N/A  Assessment & Plan:    1. OAB -she is doing well without medications  2. rUTI's -Asymptomatic  at this visit -Has not had a urinary tract infection since starting the vaginal estrogen cream  3.  Vaginal atrophy -Continue the vaginal estrogen cream 3 nights weekly  No follow-ups on file.  These notes generated with voice recognition software. I apologize for typographical errors.  Cloretta Ned  [facility] [address]  [address] [place], [place] [address] [phone]

## 2023-12-19 ENCOUNTER — Encounter: Payer: Self-pay | Admitting: Urology

## 2023-12-19 ENCOUNTER — Ambulatory Visit: Payer: PPO | Admitting: Urology

## 2023-12-19 VITALS — BP 184/73 | HR 62 | Ht 66.0 in | Wt 220.8 lb

## 2023-12-19 DIAGNOSIS — N3281 Overactive bladder: Secondary | ICD-10-CM | POA: Diagnosis not present

## 2023-12-19 DIAGNOSIS — N952 Postmenopausal atrophic vaginitis: Secondary | ICD-10-CM

## 2023-12-19 DIAGNOSIS — N390 Urinary tract infection, site not specified: Secondary | ICD-10-CM

## 2023-12-19 DIAGNOSIS — Z87440 Personal history of urinary (tract) infections: Secondary | ICD-10-CM

## 2023-12-19 MED ORDER — ESTRADIOL 0.1 MG/GM VA CREA
TOPICAL_CREAM | VAGINAL | 12 refills | Status: DC
Start: 2023-12-19 — End: 2024-07-10

## 2024-02-19 ENCOUNTER — Other Ambulatory Visit (INDEPENDENT_AMBULATORY_CARE_PROVIDER_SITE_OTHER): Payer: Self-pay | Admitting: Vascular Surgery

## 2024-02-19 DIAGNOSIS — I701 Atherosclerosis of renal artery: Secondary | ICD-10-CM

## 2024-02-20 ENCOUNTER — Ambulatory Visit (INDEPENDENT_AMBULATORY_CARE_PROVIDER_SITE_OTHER): Payer: PPO

## 2024-02-20 ENCOUNTER — Ambulatory Visit (INDEPENDENT_AMBULATORY_CARE_PROVIDER_SITE_OTHER): Payer: PPO | Admitting: Vascular Surgery

## 2024-02-20 ENCOUNTER — Encounter (INDEPENDENT_AMBULATORY_CARE_PROVIDER_SITE_OTHER): Payer: Self-pay | Admitting: Vascular Surgery

## 2024-02-20 VITALS — BP 149/65 | HR 60 | Resp 16 | Wt 217.6 lb

## 2024-02-20 DIAGNOSIS — I701 Atherosclerosis of renal artery: Secondary | ICD-10-CM | POA: Diagnosis not present

## 2024-02-20 DIAGNOSIS — I150 Renovascular hypertension: Secondary | ICD-10-CM | POA: Diagnosis not present

## 2024-02-20 DIAGNOSIS — N183 Chronic kidney disease, stage 3 unspecified: Secondary | ICD-10-CM | POA: Diagnosis not present

## 2024-02-20 NOTE — Progress Notes (Signed)
 MRN : [id number]  Julie Mcbride is a 88 y.o. (Mar 30, 1936) female who presents with chief complaint of  Chief Complaint  Patient presents with   Follow-up    34yr renal follow up  .  History of Present Illness: Patient returns today in follow up of renal artery stenosis and hypertension.  She says her blood pressure mostly runs in the 140s systolic but occasionally gets in the 150s and 160s.  She feels well.  She does not have any new symptoms or complaints today.  She is about 4 years status post left renal artery stent placement for renovascular hypertension which did improve her blood pressure some, but she continues to require antihypertensives.  Her renal artery duplex today shows a patent left renal artery stent without any significant recurrent stenosis and no hemodynamically significant stenosis was identified in the right renal artery.  Current Outpatient Medications  Medication Sig Dispense Refill   bisoprolol-hydrochlorothiazide (ZIAC) 10-6.25 MG per tablet Take 1 tablet by mouth daily.     cetirizine (ZYRTEC) 10 MG tablet Take 10 mg by mouth daily.     Cholecalciferol (VITAMIN D) 50 MCG (2000 UT) tablet Take 4,000 Units by mouth daily.      clopidogrel (PLAVIX) 75 MG tablet Take 75 mg by mouth daily.     Cranberry 500 MG CAPS Take 1 capsule by mouth daily.     estradiol (ESTRACE VAGINAL) 0.1 MG/GM vaginal cream Apply 0.5mg  (pea-sized amount)  just inside the vaginal introitus with a finger-tip on Monday, Wednesday and Friday nights. 30 g 12   fluticasone (FLONASE) 50 MCG/ACT nasal spray Place 2 sprays into both nostrils as needed for allergies or rhinitis.     hydrALAZINE (APRESOLINE) 50 MG tablet Take 50 mg by mouth 3 (three) times daily.      losartan (COZAAR) 100 MG tablet Take 100 mg by mouth daily.     Multiple Vitamin (MULTIVITAMIN) capsule Take 2 capsules by mouth daily. GNC MVI     rOPINIRole (REQUIP) 0.25 MG tablet Take 0.5 mg by mouth at bedtime.     atorvastatin  (LIPITOR) 10 MG tablet Take 1 tablet (10 mg total) by mouth daily. 30 tablet 11   famotidine (PEPCID) 20 MG tablet Take by mouth.     No current facility-administered medications for this visit.    Past Medical History:  Diagnosis Date   Arthritis    Knees, hands   Basal cell carcinoma    back - removed   Bladder polyp    With previos hematuria   Diverticulitis    Dyspnea on exertion    Chronic   GERD (gastroesophageal reflux disease)    History of ETT    Myoview 7/10 EF 78%, no ischemia or infarction. Poor exercise tolerance (2'30", stopped due to fatigue). Reached 86% MPHR.   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 Mortimer Fries   BACK SURGERY     BASAL CELL CARCINOMA EXCISION     back   BREAST BIOPSY Right 06/19/2017   Affirm Bx- Radial Scar  BREAST EXCISIONAL BIOPSY Right 1996   neg   BREAST LUMPECTOMY WITH NEEDLE LOCALIZATION Right 08/11/2017   Procedure: BREAST LUMPECTOMY WITH NEEDLE LOCALIZATION;  Surgeon: Nadeen Landau, MD;  Location: [facility];  Service: General;  Laterality: Right;   CARPAL TUNNEL RELEASE Right    CATARACT EXTRACTION W/ INTRAOCULAR LENS  IMPLANT, BILATERAL     EXCISION OF BREAST BIOPSY Right 08/11/2017   excision of radial Scar   EYE SURGERY     JOINT REPLACEMENT Right    knee   KNEE ARTHROSCOPY Right 04/22/2016   Procedure: ARTHROSCOPY KNEE;  Surgeon: Erin Sons, MD;  Location: [facility];  Service: Orthopedics;  Laterality: Right;   knee replacement Right 01/11/2017   NASAL SINUS SURGERY  06/05/14   Dr. Andee Poles   POSTERIOR CERVICAL LAMINECTOMY  2005   C5-7; plate and screws   RENAL ANGIOGRAPHY Left  04/20/2020   Procedure: RENAL ANGIOGRAPHY;  Surgeon: Annice Needy, MD;  Location: [facility];  Service: Cardiovascular;  Laterality: Left;   TONSILLECTOMY       Social History   Tobacco Use   Smoking status: Never   Smokeless tobacco: Never  Vaping Use   Vaping status: Never Used  Substance Use Topics   Alcohol use: No   Drug use: No      Family History  Problem Relation Age of Onset   Ovarian cancer Mother    Stroke Father        CVA   Breast cancer Daughter 57   Breast cancer Maternal Aunt 60   Breast cancer Maternal Aunt 90   Prostate cancer Neg Hx    Bladder Cancer Neg Hx    Kidney cancer Neg Hx      Allergies  Allergen Reactions   Chlorhexidine Hives    SAGE wipes caused severe rash   Cyclobenzaprine Hcl     Doesn't remember reaction   Etodolac     Doesn't remember reaction   Lisinopril     Coughing up blood   Meloxicam     Doesn't remember reaction   Metaxalone     Doesn't remember reaction   Neomycin-Bacitracin Zn-Polymyx     Skin irritation   Nifedipine Swelling   Nitrofurantoin     Doesn't remember reaction   Sulfamethoxazole-Trimethoprim Hives   Sulfonamide Derivatives Hives   Tizanidine     Liver problems   Vasotec [Enalapril Maleate]     Doesn't remember reaction   Ibuprofen Rash   Tape Rash     REVIEW OF SYSTEMS (Negative unless checked)   Constitutional: [] Weight loss  [] Fever  [] Chills Cardiac: [] Chest pain   [] Chest pressure   [x] Palpitations   [] Shortness of breath when laying flat   [] Shortness of breath at rest   [] Shortness of breath with exertion. Vascular:  [] Pain in legs with walking   [] Pain in legs at rest   [] Pain in legs when laying flat   [] Claudication   [] Pain in feet when walking  [] Pain in feet at rest  [] Pain in feet when laying flat   [] History of DVT   [] Phlebitis   [] Swelling in legs   [] Varicose veins   [] Non-healing ulcers Pulmonary:   [] Uses home oxygen   [] Productive cough   [] Hemoptysis   [] Wheeze   [] COPD   [] Asthma Neurologic:  [] Dizziness  [] Blackouts   [] Seizures   [] History of stroke   [] History of TIA  [] Aphasia   [] Temporary blindness   [] Dysphagia   [] Weakness or numbness  in arms   [] Weakness or numbness in legs Musculoskeletal:  [x] Arthritis   [] Joint swelling   [x] Joint pain   [] Low back pain Hematologic:  [] Easy bruising  [] Easy bleeding   [] Hypercoagulable state   [] Anemic  [] Hepatitis Gastrointestinal:  [] Blood in stool   [] Vomiting blood  [x] Gastroesophageal reflux/heartburn   [] Abdominal pain Genitourinary:  [x] Chronic kidney disease   [] Difficult urination  [x] Frequent urination  [] Burning with urination   [] Hematuria Skin:  [] Rashes   [] Ulcers   [] Wounds Psychological:  [] History of anxiety   []  History of major depression.   Physical Examination  BP (!) 149/65   Pulse 60   Resp 16   Wt 217 lb 9.6 oz (98.7 kg)   BMI 35.12 kg/m  Gen:  WD/WN, NAD.  Appears younger than stated age Head: [place]/AT, No temporalis wasting. Ear/Nose/Throat: Hearing grossly intact, nares w/o erythema or drainage Eyes: Conjunctiva clear. Sclera non-icteric Neck: Supple.  Trachea midline Pulmonary:  Good air movement, no use of accessory muscles.  Cardiac: RRR, no JVD Vascular:  Vessel Right Left  Radial Palpable Palpable           Musculoskeletal: M/S 5/5 throughout.  No deformity or atrophy.  No significant lower extremity edema. Neurologic: Sensation grossly intact in extremities.  Symmetrical.  Speech is fluent.  Psychiatric: Judgment intact, Mood & affect appropriate for pt's clinical situation. Dermatologic: No rashes or ulcers noted.  No cellulitis or open wounds.     Labs No results found for this or any previous visit (from the past 2160 hours).  Radiology No results found.  Assessment/Plan  Renal artery stenosis (HCC) Her renal artery duplex today shows a patent left renal artery stent without any significant recurrent stenosis and no hemodynamically significant  stenosis was identified in the right renal artery.  This did result in improved blood pressure control although she does still require antihypertensives.  No further invasive vascular workup required at this time.  Recheck in 1 year with renal artery duplex.  CKD (chronic kidney disease) stage 3, GFR 30-59 ml/min Another concern for possible renal artery issues.  Blood pressure control is important for reducing progression of her chronic kidney disease.   Renovascular hypertension Blood pressure control is much better after renal artery stent placement a few years ago.  Still with some degree of hypertension and will defer management to her primary care physician.  Festus Barren, MD  02/20/2024 9:18 AM    This note was created with Dragon medical transcription system.  Any errors from dictation are purely unintentional

## 2024-02-20 NOTE — Assessment & Plan Note (Signed)
 Her renal artery duplex today shows a patent left renal artery stent without any significant recurrent stenosis and no hemodynamically significant stenosis was identified in the right renal artery.  This did result in improved blood pressure control although she does still require antihypertensives.  No further invasive vascular workup required at this time.  Recheck in 1 year with renal artery duplex.

## 2024-02-23 DIAGNOSIS — Z85828 Personal history of other malignant neoplasm of skin: Secondary | ICD-10-CM | POA: Diagnosis not present

## 2024-02-23 DIAGNOSIS — D2271 Melanocytic nevi of right lower limb, including hip: Secondary | ICD-10-CM | POA: Diagnosis not present

## 2024-02-23 DIAGNOSIS — L821 Other seborrheic keratosis: Secondary | ICD-10-CM | POA: Diagnosis not present

## 2024-02-23 DIAGNOSIS — L400 Psoriasis vulgaris: Secondary | ICD-10-CM | POA: Diagnosis not present

## 2024-02-23 DIAGNOSIS — D2272 Melanocytic nevi of left lower limb, including hip: Secondary | ICD-10-CM | POA: Diagnosis not present

## 2024-02-23 DIAGNOSIS — D2262 Melanocytic nevi of left upper limb, including shoulder: Secondary | ICD-10-CM | POA: Diagnosis not present

## 2024-02-28 DIAGNOSIS — N1832 Chronic kidney disease, stage 3b: Secondary | ICD-10-CM | POA: Diagnosis not present

## 2024-02-28 DIAGNOSIS — I252 Old myocardial infarction: Secondary | ICD-10-CM | POA: Diagnosis not present

## 2024-02-28 DIAGNOSIS — I10 Essential (primary) hypertension: Secondary | ICD-10-CM | POA: Diagnosis not present

## 2024-03-06 DIAGNOSIS — I252 Old myocardial infarction: Secondary | ICD-10-CM | POA: Diagnosis not present

## 2024-03-06 DIAGNOSIS — N1832 Chronic kidney disease, stage 3b: Secondary | ICD-10-CM | POA: Diagnosis not present

## 2024-03-06 DIAGNOSIS — E6609 Other obesity due to excess calories: Secondary | ICD-10-CM | POA: Diagnosis not present

## 2024-03-06 DIAGNOSIS — G2581 Restless legs syndrome: Secondary | ICD-10-CM | POA: Diagnosis not present

## 2024-03-06 DIAGNOSIS — I255 Ischemic cardiomyopathy: Secondary | ICD-10-CM | POA: Diagnosis not present

## 2024-03-06 DIAGNOSIS — I10 Essential (primary) hypertension: Secondary | ICD-10-CM | POA: Diagnosis not present

## 2024-04-05 DIAGNOSIS — F0150 Vascular dementia without behavioral disturbance: Secondary | ICD-10-CM | POA: Diagnosis not present

## 2024-04-05 DIAGNOSIS — F0280 Dementia in other diseases classified elsewhere without behavioral disturbance: Secondary | ICD-10-CM | POA: Diagnosis not present

## 2024-04-05 DIAGNOSIS — G309 Alzheimer's disease, unspecified: Secondary | ICD-10-CM | POA: Diagnosis not present

## 2024-04-16 ENCOUNTER — Encounter (INDEPENDENT_AMBULATORY_CARE_PROVIDER_SITE_OTHER): Payer: Self-pay

## 2024-05-10 ENCOUNTER — Other Ambulatory Visit: Payer: Self-pay

## 2024-05-10 ENCOUNTER — Inpatient Hospital Stay
Admission: EM | Admit: 2024-05-10 | Discharge: 2024-05-14 | DRG: 552 | Disposition: A | Discharge status: Home Health | Attending: Internal Medicine | Admitting: Internal Medicine

## 2024-05-10 ENCOUNTER — Emergency Department

## 2024-05-10 DIAGNOSIS — I5022 Chronic systolic (congestive) heart failure: Secondary | ICD-10-CM | POA: Diagnosis not present

## 2024-05-10 DIAGNOSIS — M51379 Other intervertebral disc degeneration, lumbosacral region without mention of lumbar back pain or lower extremity pain: Secondary | ICD-10-CM | POA: Diagnosis present

## 2024-05-10 DIAGNOSIS — Z85828 Personal history of other malignant neoplasm of skin: Secondary | ICD-10-CM

## 2024-05-10 DIAGNOSIS — R52 Pain, unspecified: Secondary | ICD-10-CM | POA: Diagnosis not present

## 2024-05-10 DIAGNOSIS — I447 Left bundle-branch block, unspecified: Secondary | ICD-10-CM | POA: Diagnosis not present

## 2024-05-10 DIAGNOSIS — I701 Atherosclerosis of renal artery: Secondary | ICD-10-CM | POA: Diagnosis not present

## 2024-05-10 DIAGNOSIS — M170 Bilateral primary osteoarthritis of knee: Secondary | ICD-10-CM | POA: Diagnosis present

## 2024-05-10 DIAGNOSIS — Z7902 Long term (current) use of antithrombotics/antiplatelets: Secondary | ICD-10-CM | POA: Diagnosis not present

## 2024-05-10 DIAGNOSIS — Z87442 Personal history of urinary calculi: Secondary | ICD-10-CM

## 2024-05-10 DIAGNOSIS — M546 Pain in thoracic spine: Principal | ICD-10-CM

## 2024-05-10 DIAGNOSIS — M47817 Spondylosis without myelopathy or radiculopathy, lumbosacral region: Secondary | ICD-10-CM | POA: Diagnosis not present

## 2024-05-10 DIAGNOSIS — R911 Solitary pulmonary nodule: Secondary | ICD-10-CM | POA: Diagnosis not present

## 2024-05-10 DIAGNOSIS — J90 Pleural effusion, not elsewhere classified: Secondary | ICD-10-CM

## 2024-05-10 DIAGNOSIS — X58XXXA Exposure to other specified factors, initial encounter: Secondary | ICD-10-CM | POA: Diagnosis present

## 2024-05-10 DIAGNOSIS — C3491 Malignant neoplasm of unspecified part of right bronchus or lung: Secondary | ICD-10-CM | POA: Diagnosis not present

## 2024-05-10 DIAGNOSIS — M19042 Primary osteoarthritis, left hand: Secondary | ICD-10-CM | POA: Diagnosis present

## 2024-05-10 DIAGNOSIS — Z96651 Presence of right artificial knee joint: Secondary | ICD-10-CM | POA: Diagnosis present

## 2024-05-10 DIAGNOSIS — Z888 Allergy status to other drugs, medicaments and biological substances status: Secondary | ICD-10-CM

## 2024-05-10 DIAGNOSIS — K5903 Drug induced constipation: Secondary | ICD-10-CM | POA: Diagnosis present

## 2024-05-10 DIAGNOSIS — I255 Ischemic cardiomyopathy: Secondary | ICD-10-CM | POA: Diagnosis not present

## 2024-05-10 DIAGNOSIS — Z6835 Body mass index (BMI) 35.0-35.9, adult: Secondary | ICD-10-CM

## 2024-05-10 DIAGNOSIS — I10 Essential (primary) hypertension: Secondary | ICD-10-CM | POA: Diagnosis present

## 2024-05-10 DIAGNOSIS — M5137 Other intervertebral disc degeneration, lumbosacral region with discogenic back pain only: Secondary | ICD-10-CM | POA: Diagnosis not present

## 2024-05-10 DIAGNOSIS — C3492 Malignant neoplasm of unspecified part of left bronchus or lung: Secondary | ICD-10-CM | POA: Diagnosis not present

## 2024-05-10 DIAGNOSIS — M40209 Unspecified kyphosis, site unspecified: Secondary | ICD-10-CM | POA: Diagnosis not present

## 2024-05-10 DIAGNOSIS — Z79899 Other long term (current) drug therapy: Secondary | ICD-10-CM

## 2024-05-10 DIAGNOSIS — N1831 Chronic kidney disease, stage 3a: Secondary | ICD-10-CM | POA: Diagnosis not present

## 2024-05-10 DIAGNOSIS — I150 Renovascular hypertension: Secondary | ICD-10-CM | POA: Diagnosis present

## 2024-05-10 DIAGNOSIS — Z886 Allergy status to analgesic agent status: Secondary | ICD-10-CM

## 2024-05-10 DIAGNOSIS — R590 Localized enlarged lymph nodes: Secondary | ICD-10-CM | POA: Diagnosis present

## 2024-05-10 DIAGNOSIS — Z9842 Cataract extraction status, left eye: Secondary | ICD-10-CM

## 2024-05-10 DIAGNOSIS — E66812 Obesity, class 2: Secondary | ICD-10-CM | POA: Diagnosis not present

## 2024-05-10 DIAGNOSIS — K219 Gastro-esophageal reflux disease without esophagitis: Secondary | ICD-10-CM | POA: Diagnosis present

## 2024-05-10 DIAGNOSIS — Z881 Allergy status to other antibiotic agents status: Secondary | ICD-10-CM | POA: Diagnosis not present

## 2024-05-10 DIAGNOSIS — K5730 Diverticulosis of large intestine without perforation or abscess without bleeding: Secondary | ICD-10-CM | POA: Diagnosis not present

## 2024-05-10 DIAGNOSIS — M4804 Spinal stenosis, thoracic region: Secondary | ICD-10-CM | POA: Diagnosis not present

## 2024-05-10 DIAGNOSIS — R918 Other nonspecific abnormal finding of lung field: Secondary | ICD-10-CM | POA: Diagnosis not present

## 2024-05-10 DIAGNOSIS — Z883 Allergy status to other anti-infective agents status: Secondary | ICD-10-CM

## 2024-05-10 DIAGNOSIS — Z90710 Acquired absence of both cervix and uterus: Secondary | ICD-10-CM

## 2024-05-10 DIAGNOSIS — Z882 Allergy status to sulfonamides status: Secondary | ICD-10-CM | POA: Diagnosis not present

## 2024-05-10 DIAGNOSIS — D696 Thrombocytopenia, unspecified: Secondary | ICD-10-CM | POA: Diagnosis present

## 2024-05-10 DIAGNOSIS — M5134 Other intervertebral disc degeneration, thoracic region: Secondary | ICD-10-CM | POA: Diagnosis present

## 2024-05-10 DIAGNOSIS — Z8679 Personal history of other diseases of the circulatory system: Secondary | ICD-10-CM

## 2024-05-10 DIAGNOSIS — Z79890 Hormone replacement therapy (postmenopausal): Secondary | ICD-10-CM

## 2024-05-10 DIAGNOSIS — M51369 Other intervertebral disc degeneration, lumbar region without mention of lumbar back pain or lower extremity pain: Principal | ICD-10-CM | POA: Diagnosis present

## 2024-05-10 DIAGNOSIS — Z66 Do not resuscitate: Secondary | ICD-10-CM | POA: Diagnosis not present

## 2024-05-10 DIAGNOSIS — D631 Anemia in chronic kidney disease: Secondary | ICD-10-CM | POA: Diagnosis present

## 2024-05-10 DIAGNOSIS — S39012A Strain of muscle, fascia and tendon of lower back, initial encounter: Secondary | ICD-10-CM | POA: Diagnosis present

## 2024-05-10 DIAGNOSIS — N200 Calculus of kidney: Secondary | ICD-10-CM | POA: Diagnosis not present

## 2024-05-10 DIAGNOSIS — I110 Hypertensive heart disease with heart failure: Secondary | ICD-10-CM | POA: Diagnosis not present

## 2024-05-10 DIAGNOSIS — C3490 Malignant neoplasm of unspecified part of unspecified bronchus or lung: Secondary | ICD-10-CM | POA: Insufficient documentation

## 2024-05-10 DIAGNOSIS — M19041 Primary osteoarthritis, right hand: Secondary | ICD-10-CM | POA: Diagnosis present

## 2024-05-10 DIAGNOSIS — Z961 Presence of intraocular lens: Secondary | ICD-10-CM | POA: Diagnosis present

## 2024-05-10 DIAGNOSIS — M545 Low back pain, unspecified: Secondary | ICD-10-CM | POA: Diagnosis present

## 2024-05-10 DIAGNOSIS — Z9109 Other allergy status, other than to drugs and biological substances: Secondary | ICD-10-CM

## 2024-05-10 DIAGNOSIS — Z9841 Cataract extraction status, right eye: Secondary | ICD-10-CM

## 2024-05-10 DIAGNOSIS — Z86008 Personal history of in-situ neoplasm of other site: Secondary | ICD-10-CM

## 2024-05-10 DIAGNOSIS — G894 Chronic pain syndrome: Secondary | ICD-10-CM | POA: Diagnosis not present

## 2024-05-10 DIAGNOSIS — M47816 Spondylosis without myelopathy or radiculopathy, lumbar region: Secondary | ICD-10-CM | POA: Diagnosis not present

## 2024-05-10 DIAGNOSIS — K449 Diaphragmatic hernia without obstruction or gangrene: Secondary | ICD-10-CM | POA: Diagnosis not present

## 2024-05-10 LAB — COMPREHENSIVE METABOLIC PANEL WITH GFR
ALT: 22 U/L (ref 0–44)
AST: 28 U/L (ref 15–41)
Albumin: 3.8 g/dL (ref 3.5–5.0)
Alkaline Phosphatase: 77 U/L (ref 38–126)
Anion gap: 10 (ref 5–15)
BUN: 31 mg/dL — ABNORMAL HIGH (ref 8–23)
CO2: 24 mmol/L (ref 22–32)
Calcium: 9.7 mg/dL (ref 8.9–10.3)
Chloride: 105 mmol/L (ref 98–111)
Creatinine, Ser: 1.42 mg/dL — ABNORMAL HIGH (ref 0.44–1.00)
GFR, Estimated: 36 mL/min — ABNORMAL LOW (ref >60)
Glucose, Bld: 131 mg/dL — ABNORMAL HIGH (ref 70–99)
Potassium: 3.9 mmol/L (ref 3.5–5.1)
Sodium: 139 mmol/L (ref 135–145)
Total Bilirubin: 1 mg/dL (ref 0.0–1.2)
Total Protein: 6.8 g/dL (ref 6.5–8.1)

## 2024-05-10 LAB — CBC WITH DIFFERENTIAL/PLATELET
Abs Immature Granulocytes: 0.04 10*3/uL (ref 0.00–0.07)
Basophils Absolute: 0 10*3/uL (ref 0.0–0.1)
Basophils Relative: 0 %
Eosinophils Absolute: 0.1 10*3/uL (ref 0.0–0.5)
Eosinophils Relative: 1 %
HCT: 39.8 % (ref 36.0–46.0)
Hemoglobin: 13.2 g/dL (ref 12.0–15.0)
Immature Granulocytes: 0 %
Lymphocytes Relative: 16 %
Lymphs Abs: 1.6 10*3/uL (ref 0.7–4.0)
MCH: 30.1 pg (ref 26.0–34.0)
MCHC: 33.2 g/dL (ref 30.0–36.0)
MCV: 90.7 fL (ref 80.0–100.0)
Monocytes Absolute: 0.8 10*3/uL (ref 0.1–1.0)
Monocytes Relative: 8 %
Neutro Abs: 7.7 10*3/uL (ref 1.7–7.7)
Neutrophils Relative %: 75 %
Platelets: 180 10*3/uL (ref 150–400)
RBC: 4.39 MIL/uL (ref 3.87–5.11)
RDW: 13.4 % (ref 11.5–15.5)
WBC: 10.3 10*3/uL (ref 4.0–10.5)
nRBC: 0 % (ref 0.0–0.2)

## 2024-05-10 LAB — TROPONIN I (HIGH SENSITIVITY)
Troponin I (High Sensitivity): 11 ng/L (ref <18)
Troponin I (High Sensitivity): 10 ng/L (ref <18)

## 2024-05-10 LAB — LIPASE, BLOOD: Lipase: 49 U/L (ref 11–51)

## 2024-05-10 MED ORDER — MORPHINE SULFATE (PF) 4 MG/ML IV SOLN
4.0000 mg | INTRAVENOUS | Status: DC | PRN
  Filled 2024-05-10: qty 1

## 2024-05-10 MED ORDER — MORPHINE SULFATE (PF) 4 MG/ML IV SOLN
4.0000 mg | Freq: Once | INTRAVENOUS | Status: AC
  Filled 2024-05-10: qty 1

## 2024-05-10 MED ORDER — ALPRAZOLAM 0.25 MG PO TABS
0.2500 mg | ORAL_TABLET | Freq: Once | ORAL | Status: AC
  Filled 2024-05-10: qty 1

## 2024-05-10 MED ORDER — ONDANSETRON HCL 4 MG/2ML IJ SOLN
4.0000 mg | Freq: Once | INTRAMUSCULAR | Status: AC
  Filled 2024-05-10: qty 2

## 2024-05-10 MED ORDER — DIPHENHYDRAMINE HCL 12.5 MG/5ML PO ELIX: 12.5000 mg | ORAL_SOLUTION | Freq: Four times a day (QID) | ORAL | Status: DC | PRN

## 2024-05-10 MED ORDER — DIPHENHYDRAMINE HCL 50 MG/ML IJ SOLN: 12.5000 mg | Freq: Four times a day (QID) | INTRAMUSCULAR | Status: DC | PRN

## 2024-05-10 MED ORDER — HYDROMORPHONE HCL 1 MG/ML IJ SOLN
0.5000 mg | Freq: Once | INTRAMUSCULAR | Status: AC
  Filled 2024-05-10: qty 0.5

## 2024-05-10 MED ORDER — LIDOCAINE 5 % EX PTCH
1.0000 | MEDICATED_PATCH | Freq: Once | CUTANEOUS | Status: AC
Start: 2024-05-10 — End: 2024-05-11
  Filled 2024-05-10: qty 1

## 2024-05-10 MED ORDER — ONDANSETRON HCL 4 MG PO TABS: 4.0000 mg | ORAL_TABLET | Freq: Four times a day (QID) | ORAL | Status: DC | PRN

## 2024-05-10 MED ORDER — HYDROMORPHONE HCL 1 MG/ML IJ SOLN: 1.0000 mg | INTRAMUSCULAR | Status: DC | PRN

## 2024-05-10 MED ORDER — NALOXONE HCL 0.4 MG/ML IJ SOLN: 0.4000 mg | INTRAMUSCULAR | Status: DC | PRN

## 2024-05-10 MED ORDER — HEPARIN SODIUM (PORCINE) 5000 UNIT/ML IJ SOLN
5000.0000 [IU] | Freq: Three times a day (TID) | INTRAMUSCULAR | Status: DC
  Administered 2024-05-12 – 2024-05-14 (×7): 5000 [IU] via SUBCUTANEOUS
  Filled 2024-05-10 (×11): qty 1

## 2024-05-10 MED ORDER — FENTANYL CITRATE PF 50 MCG/ML IJ SOSY
50.0000 ug | PREFILLED_SYRINGE | Freq: Once | INTRAMUSCULAR | Status: AC
  Filled 2024-05-10: qty 1

## 2024-05-10 MED ORDER — ACETAMINOPHEN 650 MG RE SUPP
650.0000 mg | Freq: Four times a day (QID) | RECTAL | Status: AC | PRN
Start: 2024-05-10 — End: 2024-05-15

## 2024-05-10 MED ORDER — LACTATED RINGERS IV SOLN: INTRAVENOUS | Status: DC

## 2024-05-10 MED ORDER — HYDROMORPHONE 1 MG/ML IV SOLN
INTRAVENOUS | Status: DC
  Filled 2024-05-10: qty 30

## 2024-05-10 MED ORDER — ONDANSETRON HCL 4 MG/2ML IJ SOLN
4.0000 mg | Freq: Four times a day (QID) | INTRAMUSCULAR | Status: DC | PRN
Start: 2024-05-10 — End: 2024-05-10

## 2024-05-10 MED ORDER — MORPHINE SULFATE 1 MG/ML IV SOLN PCA: INTRAVENOUS | Status: DC

## 2024-05-10 MED ORDER — SODIUM CHLORIDE 0.9% FLUSH: 9.0000 mL | INTRAVENOUS | Status: DC | PRN

## 2024-05-10 MED ORDER — IOHEXOL 350 MG/ML SOLN: 75.0000 mL | Freq: Once | INTRAVENOUS | Status: AC | PRN

## 2024-05-10 MED ORDER — ONDANSETRON HCL 4 MG/2ML IJ SOLN: 4.0000 mg | Freq: Four times a day (QID) | INTRAMUSCULAR | Status: DC | PRN

## 2024-05-10 MED ORDER — FENTANYL CITRATE PF 50 MCG/ML IJ SOSY: 50.0000 ug | PREFILLED_SYRINGE | INTRAMUSCULAR | Status: DC | PRN

## 2024-05-10 MED ORDER — FLUTICASONE PROPIONATE 50 MCG/ACT NA SUSP: 2.0000 | NASAL | Status: DC | PRN

## 2024-05-10 MED ORDER — METHOCARBAMOL 500 MG PO TABS
500.0000 mg | ORAL_TABLET | Freq: Once | ORAL | Status: AC
  Filled 2024-05-10: qty 1

## 2024-05-10 MED ORDER — HYDROMORPHONE 1 MG/ML IV SOLN: INTRAVENOUS | Status: DC

## 2024-05-10 MED ORDER — SODIUM CHLORIDE 0.9 % IV BOLUS: 500.0000 mL | Freq: Once | INTRAVENOUS | Status: AC

## 2024-05-10 MED ORDER — DONEPEZIL HCL 5 MG PO TABS
5.0000 mg | ORAL_TABLET | Freq: Every day | ORAL | Status: DC
  Administered 2024-05-12: 5 mg via ORAL
  Filled 2024-05-10 (×3): qty 1

## 2024-05-10 MED ORDER — HYDROMORPHONE HCL 1 MG/ML IJ SOLN: 0.5000 mg | INTRAMUSCULAR | Status: DC | PRN

## 2024-05-10 MED ORDER — ROPINIROLE HCL 1 MG PO TABS
0.5000 mg | ORAL_TABLET | Freq: Every day | ORAL | Status: DC
  Administered 2024-05-12 – 2024-05-13 (×2): 0.5 mg via ORAL
  Filled 2024-05-10 (×4): qty 1

## 2024-05-10 MED ORDER — HYDRALAZINE HCL 20 MG/ML IJ SOLN: 5.0000 mg | Freq: Four times a day (QID) | INTRAMUSCULAR | Status: DC | PRN

## 2024-05-10 MED ORDER — DOCUSATE SODIUM 100 MG PO CAPS
100.0000 mg | ORAL_CAPSULE | Freq: Two times a day (BID) | ORAL | Status: DC
  Filled 2024-05-10: qty 1

## 2024-05-10 MED ORDER — ACETAMINOPHEN 325 MG PO TABS
650.0000 mg | ORAL_TABLET | Freq: Four times a day (QID) | ORAL | Status: AC | PRN
Start: 2024-05-10 — End: 2024-05-15

## 2024-05-10 NOTE — Plan of Care (Signed)

## 2024-05-10 NOTE — H&P (Addendum)
 History and Physical   Julie Mcbride:[id number] DOB: June 02, 1936 DOA: 05/10/2024  PCP: Monique Ano, MD  Patient coming from: home  I have personally briefly reviewed patient's old medical records in [facility] EMR.  Chief Concern: back pain  HPI: [name] is a 88 year old female hypertension, ischemic cardiomyopathy, who presents emergency department for chief concerns of worsening low back pain.  Vitals in the ED showed t of 98, rr 18, heart rate 63, blood pressure 162 over a 67, SpO2 98% on room air.  Serum sodium is 139, potassium 3.9, chloride 105, bicarb 24, BUN of 31, serum creatinine 1.42, EGFR 36, nonfasting blood glucose 131, WBC 10.3, hemoglobin 13.2, platelets of 180.  HS troponin is 11.  CTA CAP: No aortic aneurysm or dissection. Multiple interval bilateral lung nodules, compatible with metastatic disease with a possible right lower lobe primary. A multinodular infectious process is less likely. Mild mediastinal and right hilar lymphadenopathy, compatible with metastatic disease. Reactive adenopathy is less likely.  ED treatment: Fentanyl  50 mcg IV one-time dose, Dilaudid  0.5 mg IV one-time dose, lidocaine  patch, morphine  4 mg x 2, Robaxin 500 mg p.o., ondansetron  4 mg IV one-time dose, sodium chloride  500 mL liter bolus. ---------------------------------- At bedside, patient was able to tell me her first and last name, age, location, current calendar year.  She reports the pain started this morning at around 2 AM.  She denies trauma to her person, urinary or bowel incontinence.  She reports the pain is spastic in nature and over-the-counter medication provided no relief.  She reports the pain is 10 out of 10.  She denies chest pain, dysuria, hematuria, diarrhea, swelling of her lower extremities.  Social history: She lives at home.  ROS: Constitutional: no weight change, no fever ENT/Mouth: no sore throat, no rhinorrhea Eyes: no eye pain, no  vision changes Cardiovascular: no chest pain, no dyspnea,  no edema, no palpitations Respiratory: no cough, no sputum, no wheezing Gastrointestinal: no nausea, no vomiting, no diarrhea, no constipation Genitourinary: no urinary incontinence, no dysuria, no hematuria Musculoskeletal: no arthralgias, + mid back pain Skin: no skin lesions, no pruritus, Neuro: + weakness, no loss of consciousness, no syncope Psych: no anxiety, no depression, + decrease appetite Heme/Lymph: no bruising, no bleeding  ED Course: Discussed with EDP, patient requiring hospitalization for chief concerns of severe back pains requiring multiple IV pain medication.  Assessment/Plan  Principal Problem:   Inadequate pain control Active Problems:   Essential hypertension   Ischemic cardiomyopathy   Renovascular hypertension   History of renal artery stenosis   Lung cancer (HCC)   Assessment and Plan:  * Inadequate pain control Suspect carcinoma related pain Symptomatic support: Full dose PCA with Dilaudid  ordered on admission Naloxone as needed for opioid reversal  Ischemic cardiomyopathy Plavix  not resumed on admission, a.m. team to resume when benefits outweigh the risk  Essential hypertension Hydralazine  5 mg IV every 6 hours as needed for SBP greater 170, 5 days ordered  Lung cancer [facility]) Medical oncology, Dr. Adrian Alba has been consulted via secure chat  Chart reviewed.   DVT prophylaxis: Heparin  5000 units subcutaneous every 8 hours Code Status: DNR/DNI, confirmed with patient at bedside with family in the room Diet: Heart healthy Family Communication: Discussed with husband, and daughter, Ammon Bales, and son, Tim at bedside with patient's permission Disposition Plan: Pending clinical course Consults called: Medical oncology Admission status: Telemetry medical, inpatient  Past Medical History:  Diagnosis Date   Arthritis    Knees,  hands   Basal cell carcinoma    back - removed   Bladder polyp     With previos hematuria   Diverticulitis    Dyspnea on exertion    Chronic   GERD (gastroesophageal reflux disease)    History of ETT    Myoview 7/10 EF 78%, no ischemia or infarction. Poor exercise tolerance (2'30, stopped due to fatigue). Reached 86% MPHR.   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 Aggie Horton   BACK SURGERY     BASAL CELL CARCINOMA EXCISION     back   BREAST BIOPSY Right 06/19/2017   Affirm Bx- Radial Scar   BREAST EXCISIONAL BIOPSY Right 1996   neg   BREAST LUMPECTOMY WITH NEEDLE LOCALIZATION Right 08/11/2017   Procedure: BREAST LUMPECTOMY WITH NEEDLE LOCALIZATION;  Surgeon: Benancio Bracket, MD;  Location: [facility] ORS;  Service: General;  Laterality: Right;   CARPAL TUNNEL RELEASE Right    CATARACT EXTRACTION W/ INTRAOCULAR LENS  IMPLANT, BILATERAL     EXCISION OF BREAST BIOPSY Right 08/11/2017   excision of radial Scar   EYE SURGERY     JOINT REPLACEMENT Right    knee   KNEE ARTHROSCOPY Right 04/22/2016   Procedure: ARTHROSCOPY KNEE;  Surgeon: Josephus Nida, MD;  Location: [facility];  Service: Orthopedics;  Laterality: Right;   knee replacement Right 01/11/2017   NASAL SINUS SURGERY  06/05/14   Dr. Donnie Galea   POSTERIOR CERVICAL LAMINECTOMY  2005   C5-7; plate and screws   RENAL ANGIOGRAPHY Left 04/20/2020   Procedure: RENAL ANGIOGRAPHY;  Surgeon: Celso College, MD;  Location: [facility];  Service: Cardiovascular;  Laterality: Left;   TONSILLECTOMY     Social History:  reports that she has never smoked. She has never used smokeless tobacco. She reports that she does not drink alcohol and does not use  drugs.  Allergies  Allergen Reactions   Chlorhexidine Hives    SAGE wipes caused severe rash   Cyclobenzaprine Hcl     Doesn't remember reaction   Etodolac     Doesn't remember reaction   Lisinopril     Coughing up blood   Meloxicam     Doesn't remember reaction   Metaxalone     Doesn't remember reaction   Neomycin-Bacitracin Zn-Polymyx     Skin irritation   Nifedipine Swelling   Nitrofurantoin     Doesn't remember reaction   Sulfamethoxazole-Trimethoprim Hives   Sulfonamide Derivatives Hives   Tizanidine     Liver problems   Vasotec [Enalapril Maleate]     Doesn't remember reaction   Ibuprofen Rash   Tape Rash   Family History  Problem Relation Age of Onset   Ovarian cancer Mother    Stroke Father        CVA   Breast cancer Daughter 35   Breast cancer Maternal Aunt 80   Breast cancer Maternal Aunt 90   Prostate cancer Neg Hx    Bladder Cancer Neg Hx    Kidney cancer Neg Hx    Family history: Family  history reviewed and not pertinent.  Prior to Admission medications   Medication Sig Start Date End Date Taking? Authorizing Provider  donepezil (ARICEPT) 5 MG tablet Take 5 mg by mouth at bedtime. 04/05/24 04/05/25 Yes [provider]  atorvastatin  (LIPITOR) 10 MG tablet Take 1 tablet (10 mg total) by mouth daily. 04/20/20 03/08/23  Celso College, MD  bisoprolol -hydrochlorothiazide  (ZIAC ) 10-6.25 MG per tablet Take 1 tablet by mouth daily.    [provider]  cetirizine (ZYRTEC) 10 MG tablet Take 10 mg by mouth daily.    [provider]  Cholecalciferol  (VITAMIN D ) 50 MCG (2000 UT) tablet Take 4,000 Units by mouth daily.     [provider]  clopidogrel  (PLAVIX ) 75 MG tablet Take 75 mg by mouth daily.    [provider]  Cranberry 500 MG CAPS Take 1 capsule by mouth daily.    [provider]  estradiol  (ESTRACE  VAGINAL) 0.1 MG/GM vaginal cream Apply 0.5mg  (pea-sized amount)  just inside the vaginal introitus with a  finger-tip on Monday, Wednesday and Friday nights. 12/19/23   Matilde Son A, PA-C  famotidine  (PEPCID ) 20 MG tablet Take by mouth.    [provider]  fluticasone (FLONASE) 50 MCG/ACT nasal spray Place 2 sprays into both nostrils as needed for allergies or rhinitis.    [provider]  hydrALAZINE  (APRESOLINE ) 50 MG tablet Take 50 mg by mouth 3 (three) times daily.     [provider]  losartan  (COZAAR ) 100 MG tablet Take 100 mg by mouth daily. 06/17/21   [provider]  Multiple Vitamin (MULTIVITAMIN) capsule Take 2 capsules by mouth daily. [employer] MVI    [provider]  rOPINIRole  (REQUIP ) 0.25 MG tablet Take 0.5 mg by mouth at bedtime.    [provider]    Physical Exam: Vitals:   05/10/24 1033 05/10/24 1435  BP: (!) 162/67 (!) 158/60  Pulse: 63 60  Resp: 18 18  Temp: 98 F (36.7 C) 98 F (36.7 C)  TempSrc: Oral   SpO2: 98% 98%  Weight: 98.7 kg   Height: 5' 6 (1.676 m)    Constitutional: appears frail, weak, acutely in pain Eyes: PERRL, lids and conjunctivae normal ENMT: Mucous membranes are moist. Posterior pharynx clear of any exudate or lesions. Age-appropriate dentition. Hearing appropriate Neck: normal, supple, no masses, no thyromegaly Respiratory: clear to auscultation bilaterally, no wheezing, no crackles. Normal respiratory effort. No accessory muscle use.  Cardiovascular: Regular rate and rhythm, no murmurs / rubs / gallops. No extremity edema. 2+ pedal pulses. No carotid bruits.  Abdomen: Obese abdomen, no tenderness, no masses palpated, no hepatosplenomegaly. Bowel sounds positive.  Musculoskeletal: no clubbing / cyanosis. No joint deformity upper and lower extremities. No contractures, no atrophy. Normal muscle tone.  Decreased range of motion of the back due to pain Skin: no rashes, lesions, ulcers. No induration Neurologic: Sensation intact. Strength 5/5 in all 4.  Psychiatric: Normal judgment and insight.  Alert and oriented x 3. Tearful and depressed mood.   EKG: independently reviewed, showing sinus rhythm with rate of 61, QTc 485, left bundle branch block  Chest x-ray on Admission: I personally reviewed and I agree with radiologist reading as below.  CT Angio Chest/Abd/Pel for Dissection W and/or Wo Contrast Result Date: 05/10/2024 CLINICAL DATA:  Acute right back and shoulder pain at 2 a.m. today. Clinical concern for aortic dissection. EXAM: CT ANGIOGRAPHY CHEST, ABDOMEN AND PELVIS TECHNIQUE: Multidetector CT imaging through the chest, abdomen and pelvis was  performed using the standard protocol during bolus administration of intravenous contrast. Multiplanar reconstructed images and MIPs were obtained and reviewed to evaluate the vascular anatomy. RADIATION DOSE REDUCTION: This exam was performed according to the departmental dose-optimization program which includes automated exposure control, adjustment of the mA and/or kV according to patient size and/or use of iterative reconstruction technique. CONTRAST:  75mL OMNIPAQUE IOHEXOL 350 MG/ML SOLN COMPARISON:  Chest CT dated 09/09/2013. Chest radiographs dated 03/09/2023 and 12/25/2020 and chest CTA dated 09/08/2013. FINDINGS: CTA CHEST FINDINGS Cardiovascular: Mild atheromatous calcifications including the thoracic aorta. No aortic aneurysm or dissection. Normally opacified pulmonary arteries with no pulmonary arterial filling defects. Mildly enlarged heart with left atrial and left ventricular dilatation. No pericardial effusion. Mediastinum/Nodes: Moderate to large-sized hiatal hernia is again demonstrated. Unremarkable thyroid gland and trachea. Mildly enlarged precarinal node with a short axis diameter of 10 mm on image number 55/6, previously 8 mm. Mildly enlarged right hilar nodes, the largest with a short axis diameter of 11 mm on image number 60/6. Mildly enlarged subcarinal node with a short axis diameter of 10 mm on image number 63/6.  Lungs/Pleura: Multiple interval bilateral lung nodules involving all lobes of both lungs. The largest on the right measures 1.6 x 1.3 cm on image number 79/7 in the largest on the left is in the left lower lobe, measuring 1.7 x 1.1 cm on image number 86/7. One nodule is more spiculated than the others. This is in the right lower lobe, measuring 1.7 x 1.1 cm on image number 93/7. Interval small right pleural effusion. The previously demonstrated ground-glass opacity on the right has resolved. Musculoskeletal: Thoracic spine degenerative changes and cervical spine fixation hardware. No evidence of bony metastatic disease. Review of the MIP images confirms the above findings. CTA ABDOMEN AND PELVIS FINDINGS VASCULAR Aorta: Mild atheromatous calcifications.  No aneurysm or dissection. Celiac: Patent without evidence of aneurysm, dissection, vasculitis or significant stenosis. SMA: Patent without evidence of aneurysm, dissection, vasculitis or significant stenosis. Renals: Proximal left renal artery stent. This is patent. Normal-appearing right renal artery. IMA: Patent without evidence of aneurysm, dissection, vasculitis or significant stenosis. Inflow: Patent without evidence of aneurysm, dissection, vasculitis or significant stenosis. Veins: No obvious venous abnormality within the limitations of this arterial phase study. Review of the MIP images confirms the above findings. NON-VASCULAR Hepatobiliary: No significant change in previously demonstrated liver cysts. No evidence of liver metastatic disease. Normal-appearing gallbladder. Pancreas: Unremarkable. No pancreatic ductal dilatation or surrounding inflammatory changes. Spleen: Normal in size without focal abnormality. Adrenals/Urinary Tract: 1.4 x 1.0 cm low to medium density left adrenal nodule on image number 58/5 is smaller than on 09/09/2013, measuring 1.8 x 1.3 cm at that time. This currently measures 20 Hounsfield units in density and previously measured  18 Hounsfield units in density. Normal-appearing right adrenal gland. 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 This does not need imaging follow-up. Two adjacent 4 mm calculi in the mid right kidney. Normal-appearing ureters. Small left bladder diverticulum. No hydronephrosis. Stomach/Bowel: Multiple diverticula throughout the colon, most numerous in the sigmoid colon. No evidence of diverticulitis. Stable moderately large hiatal hernia. Unremarkable appendix. Lymphatic: No enlarged lymph nodes. Reproductive: Status post hysterectomy. No adnexal masses. Other: Moderate-sized bilateral inguinal hernias containing fat. Musculoskeletal: Mild lumbar spine degenerative changes. No evidence of bony metastatic disease. Review of the MIP images confirms the above findings. IMPRESSION: 1. No aortic aneurysm or dissection. 2. Multiple interval bilateral lung nodules, compatible with metastatic  disease with a possible right lower lobe primary. A multinodular infectious process is less likely. 3. Mild mediastinal and right hilar lymphadenopathy, compatible with metastatic disease. Reactive adenopathy is less likely. 4. Interval small right pleural effusion. 5. Mild cardiomegaly with left atrial and left ventricular dilatation. 6. Moderate to large-sized hiatal hernia. 7. Colonic diverticulosis. 8. Nonobstructing right renal calculi. 9. Moderate-sized bilateral inguinal hernias containing fat. 10. 1.4 x 1.0 cm low to medium density left adrenal nodule, smaller than on 09/09/2013, compatible with a benign adenoma not needing imaging follow-up. 11. Aortic atherosclerosis. Aortic Atherosclerosis (ICD10-I70.0). Electronically Signed   By: Catherin Closs M.D.   On: 05/10/2024 13:30   CT Thoracic Spine Wo Contrast Result Date: 05/10/2024 CLINICAL DATA:  Acute right back to shoulder pain. EXAM: CT THORACIC SPINE WITHOUT CONTRAST TECHNIQUE: Multidetector CT images of the thoracic were  obtained using the standard protocol without intravenous contrast. RADIATION DOSE REDUCTION: This exam was performed according to the departmental dose-optimization program which includes automated exposure control, adjustment of the mA and/or kV according to patient size and/or use of iterative reconstruction technique. COMPARISON:  None Available. FINDINGS: Alignment: Mild-to-moderate kyphosis. Vertebrae: Vertebrae are intact and appear normally mineralized. No lesions are evident. Patient is status post interbody fusion and anterior spinal fixation at C5-6 and C6-7. Paraspinal and other soft tissues: The immediate paraspinous soft tissues are unremarkable. There are pulmonary nodules present and bilateral pleural effusions, worse on the right. There is also moderate hiatus hernia. Disc levels: There is mild disc space narrowing present throughout the thoracic spine. There is no apparent disc herniation or significant spinal canal or neural foraminal stenosis. There is no apparent acute process. IMPRESSION: 1. Mild diffuse degenerative disc disease throughout the thoracic spine without significant spinal canal or neural foraminal stenosis. 2. Numerous pulmonary nodules and bilateral pleural effusions, which will be more definitively assessed in the accompanying CT of the chest. Electronically Signed   By: Maribeth Shivers M.D.   On: 05/10/2024 12:34   CT Lumbar Spine Wo Contrast Result Date: 05/10/2024 CLINICAL DATA:  Acute lower back pain. EXAM: CT LUMBAR SPINE WITHOUT CONTRAST TECHNIQUE: Multidetector CT imaging of the lumbar spine was performed without intravenous contrast administration. Multiplanar CT image reconstructions were also generated. RADIATION DOSE REDUCTION: This exam was performed according to the departmental dose-optimization program which includes automated exposure control, adjustment of the mA and/or kV according to patient size and/or use of iterative reconstruction technique.  COMPARISON:  None Available. FINDINGS: Segmentation: The L1 vertebral body has hypoplastic ribs. Alignment: Slight anterolisthesis at L5-S1. Mild dextrocurvature of the thoracolumbar junction. Vertebrae: Vertebral bodies are intact and maintain their height. No lesions are evident. Paraspinal and other soft tissues: There is no paravertebral soft tissue swelling or hematoma evident. There are nonobstructive calculi present within the right kidney. There is also moderate right renal cortical atrophy and scarring. A fat density lesion is present in the superior pole of the left kidney. There is a left renal artery stent. There are numerous sigmoid diverticula present. Disc levels: L1-2: Minimal disc bulging and mild facet hypertrophy. No significant spinal canal or neural foraminal stenosis. L2-3: Mild disc bulging and mild facet arthrosis. No significant spinal canal or neural foraminal stenosis. L3-4: Mild diffuse disc bulging and mild to moderate bilateral facet arthrosis, with mild-to-moderate central spinal canal stenosis and bilateral lateral recess stenosis. L4-5: Mild disc bulging and mild bilateral facet arthrosis. Mild right lateral recess and neural foraminal stenosis. L5-S1: Slight degenerative anterolisthesis and severe bilateral facet  arthrosis. No significant spinal canal or neural foraminal stenosis. IMPRESSION: 1. Mild diffuse degenerative disc disease and multilevel facet arthrosis, which is most severe at L5-S1. 2. Right nephrolithiasis. 3. Colonic diverticulosis. Electronically Signed   By: Maribeth Shivers M.D.   On: 05/10/2024 12:27    Labs on Admission: I have personally reviewed following labs CBC: Recent Labs  Lab 05/10/24 1057  WBC 10.3  NEUTROABS 7.7  HGB 13.2  HCT 39.8  MCV 90.7  PLT 180   Basic Metabolic Panel: Recent Labs  Lab 05/10/24 1057  NA 139  K 3.9  CL 105  CO2 24  GLUCOSE 131*  BUN 31*  CREATININE 1.42*  CALCIUM  9.7   GFR: Estimated Creatinine  Clearance: 34.3 mL/min (A) (by C-G formula based on SCr of 1.42 mg/dL (H)).  Liver Function Tests: Recent Labs  Lab 05/10/24 1057  AST 28  ALT 22  ALKPHOS 77  BILITOT 1.0  PROT 6.8  ALBUMIN 3.8   Recent Labs  Lab 05/10/24 1057  LIPASE 49   Urine analysis:    Component Value Date/Time   COLORURINE YELLOW (A) 12/25/2020 0615   APPEARANCEUR Hazy (A) 08/18/2021 1452   LABSPEC 1.014 12/25/2020 0615   LABSPEC 1.021 09/08/2013 0127   PHURINE 5.0 12/25/2020 0615   GLUCOSEU Negative 08/18/2021 1452   GLUCOSEU Negative 09/08/2013 0127   HGBUR MODERATE (A) 12/25/2020 0615   BILIRUBINUR Negative 08/18/2021 1452   BILIRUBINUR Negative 09/08/2013 0127   KETONESUR NEGATIVE 12/25/2020 0615   PROTEINUR Negative 08/18/2021 1452   PROTEINUR 30 (A) 12/25/2020 0615   NITRITE Negative 08/18/2021 1452   NITRITE POSITIVE (A) 12/25/2020 0615   LEUKOCYTESUR 1+ (A) 08/18/2021 1452   LEUKOCYTESUR MODERATE (A) 12/25/2020 0615   LEUKOCYTESUR Negative 09/08/2013 0127   CRITICAL CARE Performed by: Dr. Reinhold Carbine  Total critical care time: 32 minutes  Critical care time was exclusive of separately billable procedures and treating other patients.  Critical care was necessary to treat or prevent imminent or life-threatening deterioration.  Critical care was time spent personally by me on the following activities: development of treatment plan with patient and family as well as nursing, discussions with consultants, evaluation of patient's response to treatment, examination of patient, obtaining history from patient or surrogate, ordering and performing treatments and interventions, ordering and review of laboratory studies, ordering and review of radiographic studies, pulse oximetry and re-evaluation of patient's condition.  This document was prepared using Dragon Voice Recognition software and may include unintentional dictation errors.  Dr. Reinhold Carbine [facility]  If 7PM-7AM, please contact  overnight-coverage provider If 7AM-7PM, please contact day attending provider [email or web address]  05/10/2024, 4:07 PM

## 2024-05-10 NOTE — ED Notes (Signed)
 See triage note  Presents with back pain  States pain is mid back  and moves around  States back pain is chronic  But this is worse

## 2024-05-10 NOTE — Assessment & Plan Note (Signed)
Hydralazine 5 mg IV every 6 hours as needed for SBP greater 170, 5 days ordered

## 2024-05-10 NOTE — Assessment & Plan Note (Addendum)
 Suspect carcinoma related pain Symptomatic support: Full dose PCA with Dilaudid  ordered on admission Naloxone as needed for opioid reversal

## 2024-05-10 NOTE — Assessment & Plan Note (Signed)
 Medical oncology, Dr. Adrian Alba has been consulted via secure chat

## 2024-05-10 NOTE — Hospital Course (Addendum)
 [name] is a 88 year old female hypertension, ischemic cardiomyopathy, who presents emergency department for chief concerns of worsening low back pain.  Vitals in the ED showed t of 98, rr 18, heart rate 63, blood pressure 162 over a 67, SpO2 98% on room air.  Serum sodium is 139, potassium 3.9, chloride 105, bicarb 24, BUN of 31, serum creatinine 1.42, EGFR 36, nonfasting blood glucose 131, WBC 10.3, hemoglobin 13.2, platelets of 180.  HS troponin is 11.  CTA CAP: No aortic aneurysm or dissection. Multiple interval bilateral lung nodules, compatible with metastatic disease with a possible right lower lobe primary. A multinodular infectious process is less likely. Mild mediastinal and right hilar lymphadenopathy, compatible with metastatic disease. Reactive adenopathy is less likely.  ED treatment: Fentanyl  50 mcg IV one-time dose, Dilaudid  0.5 mg IV one-time dose, lidocaine  patch, morphine  4 mg x 2, Robaxin 500 mg p.o., ondansetron  4 mg IV one-time dose, sodium chloride  500 mL liter bolus.

## 2024-05-10 NOTE — ED Provider Notes (Signed)
 Medical screening examination/treatment/procedure(s) were conducted as a shared visit with non-physician practitioner(s) and myself.  I personally evaluated the patient during the encounter.  I personally saw and evaluated the patient.   I examined her, ordered medications, discussed with our physician extender, and developed plan.  Recommended patient be admitted to the hospital for pain control further workup care and treatment of the hospitalist service.  CT Angio Chest/Abd/Pel for Dissection W and/or Wo Contrast Result Date: 05/10/2024 CLINICAL DATA:  Acute right back and shoulder pain at 2 a.m. today. Clinical concern for aortic dissection. EXAM: CT ANGIOGRAPHY CHEST, ABDOMEN AND PELVIS TECHNIQUE: Multidetector CT imaging through the chest, abdomen and pelvis was performed using the standard protocol during bolus administration of intravenous contrast. Multiplanar reconstructed images and MIPs were obtained and reviewed to evaluate the vascular anatomy. RADIATION DOSE REDUCTION: This exam was performed according to the departmental dose-optimization program which includes automated exposure control, adjustment of the mA and/or kV according to patient size and/or use of iterative reconstruction technique. CONTRAST:  75mL OMNIPAQUE IOHEXOL 350 MG/ML SOLN COMPARISON:  Chest CT dated 09/09/2013. Chest radiographs dated 03/09/2023 and 12/25/2020 and chest CTA dated 09/08/2013. FINDINGS: CTA CHEST FINDINGS Cardiovascular: Mild atheromatous calcifications including the thoracic aorta. No aortic aneurysm or dissection. Normally opacified pulmonary arteries with no pulmonary arterial filling defects. Mildly enlarged heart with left atrial and left ventricular dilatation. No pericardial effusion. Mediastinum/Nodes: Moderate to large-sized hiatal hernia is again demonstrated. Unremarkable thyroid gland and trachea. Mildly enlarged precarinal node with a short axis diameter of 10 mm on image number 55/6,  previously 8 mm. Mildly enlarged right hilar nodes, the largest with a short axis diameter of 11 mm on image number 60/6. Mildly enlarged subcarinal node with a short axis diameter of 10 mm on image number 63/6. Lungs/Pleura: Multiple interval bilateral lung nodules involving all lobes of both lungs. The largest on the right measures 1.6 x 1.3 cm on image number 79/7 in the largest on the left is in the left lower lobe, measuring 1.7 x 1.1 cm on image number 86/7. One nodule is more spiculated than the others. This is in the right lower lobe, measuring 1.7 x 1.1 cm on image number 93/7. Interval small right pleural effusion. The previously demonstrated ground-glass opacity on the right has resolved. Musculoskeletal: Thoracic spine degenerative changes and cervical spine fixation hardware. No evidence of bony metastatic disease. Review of the MIP images confirms the above findings. CTA ABDOMEN AND PELVIS FINDINGS VASCULAR Aorta: Mild atheromatous calcifications.  No aneurysm or dissection. Celiac: Patent without evidence of aneurysm, dissection, vasculitis or significant stenosis. SMA: Patent without evidence of aneurysm, dissection, vasculitis or significant stenosis. Renals: Proximal left renal artery stent. This is patent. Normal-appearing right renal artery. IMA: Patent without evidence of aneurysm, dissection, vasculitis or significant stenosis. Inflow: Patent without evidence of aneurysm, dissection, vasculitis or significant stenosis. Veins: No obvious venous abnormality within the limitations of this arterial phase study. Review of the MIP images confirms the above findings. NON-VASCULAR Hepatobiliary: No significant change in previously demonstrated liver cysts. No evidence of liver metastatic disease. Normal-appearing gallbladder. Pancreas: Unremarkable. No pancreatic ductal dilatation or surrounding inflammatory changes. Spleen: Normal in size without focal abnormality. Adrenals/Urinary Tract: 1.4 x 1.0 cm  low to medium density left adrenal nodule on image number 58/5 is smaller than on 09/09/2013, measuring 1.8 x 1.3 cm at that time. This currently measures 20 Hounsfield units in density and previously measured 18 Hounsfield units in density. Normal-appearing right adrenal gland. 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 This does not need imaging follow-up. Two adjacent 4 mm calculi in the mid right kidney. Normal-appearing ureters. Small left bladder diverticulum. No hydronephrosis. Stomach/Bowel: Multiple diverticula throughout the colon, most numerous in the sigmoid colon. No evidence of diverticulitis. Stable moderately large hiatal hernia. Unremarkable appendix. Lymphatic: No enlarged lymph nodes. Reproductive: Status post hysterectomy. No adnexal masses. Other: Moderate-sized bilateral inguinal hernias containing fat. Musculoskeletal: Mild lumbar spine degenerative changes. No evidence of bony metastatic disease. Review of the MIP images confirms the above findings. IMPRESSION: 1. No aortic aneurysm or dissection. 2. Multiple interval bilateral lung nodules, compatible with metastatic disease with a possible right lower lobe primary. A multinodular infectious process is less likely. 3. Mild mediastinal and right hilar lymphadenopathy, compatible with metastatic disease. Reactive adenopathy is less likely. 4. Interval small right pleural effusion. 5. Mild cardiomegaly with left atrial and left ventricular dilatation. 6. Moderate to large-sized hiatal hernia. 7. Colonic diverticulosis. 8. Nonobstructing right renal calculi. 9. Moderate-sized bilateral inguinal hernias containing fat. 10. 1.4 x 1.0 cm low to medium density left adrenal nodule, smaller than on 09/09/2013, compatible with a benign adenoma not needing imaging follow-up. 11. Aortic atherosclerosis. Aortic Atherosclerosis (ICD10-I70.0). Electronically Signed   By: Catherin Closs M.D.   On: 05/10/2024 13:30    CT Thoracic Spine Wo Contrast Result Date: 05/10/2024 CLINICAL DATA:  Acute right back to shoulder pain. EXAM: CT THORACIC SPINE WITHOUT CONTRAST TECHNIQUE: Multidetector CT images of the thoracic were obtained using the standard protocol without intravenous contrast. RADIATION DOSE REDUCTION: This exam was performed according to the departmental dose-optimization program which includes automated exposure control, adjustment of the mA and/or kV according to patient size and/or use of iterative reconstruction technique. COMPARISON:  None Available. FINDINGS: Alignment: Mild-to-moderate kyphosis. Vertebrae: Vertebrae are intact and appear normally mineralized. No lesions are evident. Patient is status post interbody fusion and anterior spinal fixation at C5-6 and C6-7. Paraspinal and other soft tissues: The immediate paraspinous soft tissues are unremarkable. There are pulmonary nodules present and bilateral pleural effusions, worse on the right. There is also moderate hiatus hernia. Disc levels: There is mild disc space narrowing present throughout the thoracic spine. There is no apparent disc herniation or significant spinal canal or neural foraminal stenosis. There is no apparent acute process. IMPRESSION: 1. Mild diffuse degenerative disc disease throughout the thoracic spine without significant spinal canal or neural foraminal stenosis. 2. Numerous pulmonary nodules and bilateral pleural effusions, which will be more definitively assessed in the accompanying CT of the chest. Electronically Signed   By: Maribeth Shivers M.D.   On: 05/10/2024 12:34   CT Lumbar Spine Wo Contrast Result Date: 05/10/2024 CLINICAL DATA:  Acute lower back pain. EXAM: CT LUMBAR SPINE WITHOUT CONTRAST TECHNIQUE: Multidetector CT imaging of the lumbar spine was performed without intravenous contrast administration. Multiplanar CT image reconstructions were also generated. RADIATION DOSE REDUCTION: This exam was performed according  to the departmental dose-optimization program which includes automated exposure control, adjustment of the mA and/or kV according to patient size and/or use of iterative reconstruction technique. COMPARISON:  None Available. FINDINGS: Segmentation: The L1 vertebral body has hypoplastic ribs. Alignment: Slight anterolisthesis at L5-S1. Mild dextrocurvature of the thoracolumbar junction. Vertebrae: Vertebral bodies are intact and maintain their height. No lesions are evident. Paraspinal and other soft tissues: There is no paravertebral soft tissue swelling or hematoma evident. There are nonobstructive calculi present within the right kidney. There is also moderate  right renal cortical atrophy and scarring. A fat density lesion is present in the superior pole of the left kidney. There is a left renal artery stent. There are numerous sigmoid diverticula present. Disc levels: L1-2: Minimal disc bulging and mild facet hypertrophy. No significant spinal canal or neural foraminal stenosis. L2-3: Mild disc bulging and mild facet arthrosis. No significant spinal canal or neural foraminal stenosis. L3-4: Mild diffuse disc bulging and mild to moderate bilateral facet arthrosis, with mild-to-moderate central spinal canal stenosis and bilateral lateral recess stenosis. L4-5: Mild disc bulging and mild bilateral facet arthrosis. Mild right lateral recess and neural foraminal stenosis. L5-S1: Slight degenerative anterolisthesis and severe bilateral facet arthrosis. No significant spinal canal or neural foraminal stenosis. IMPRESSION: 1. Mild diffuse degenerative disc disease and multilevel facet arthrosis, which is most severe at L5-S1. 2. Right nephrolithiasis. 3. Colonic diverticulosis. Electronically Signed   By: Maribeth Shivers M.D.   On: 05/10/2024 12:27      Iver Marker, MD 05/10/24 1537

## 2024-05-10 NOTE — ED Notes (Signed)
 Conts to have pain  States the pain eases off the she develops a spasm like pain Family remain at bedside

## 2024-05-10 NOTE — ED Provider Notes (Signed)
 [facility] Provider Note    Event Date/Time   First MD Initiated Contact with Patient 05/10/24 1047     (approximate)   History   Back Pain   HPI  Julie Mcbride is a 88 y.o. female history of hyper tension, laminectomy, kidney stones presents emergency department with complaints of severe lower back pain radiating across her back and up into her shoulder.  Started around 2 AM this morning.  States thinks it is back spasm.  Denies fever or chills.  Took regular medications at home and could not get comfortable.  Denies chest pain shortness of breath      Physical Exam   Triage Vital Signs: ED Triage Vitals [05/10/24 1033]  Encounter Vitals Group     BP (!) 162/67     Girls Systolic BP Percentile      Girls Diastolic BP Percentile      Boys Systolic BP Percentile      Boys Diastolic BP Percentile      Pulse Rate 63     Resp 18     Temp 98 F (36.7 C)     Temp Source Oral     SpO2 98 %     Weight 217 lb 9.5 oz (98.7 kg)     Height 5' 6 (1.676 m)     Head Circumference      Peak Flow      Pain Score 10     Pain Loc      Pain Education      Exclude from Growth Chart     Most recent vital signs: Vitals:   05/10/24 1033 05/10/24 1435  BP: (!) 162/67 (!) 158/60  Pulse: 63 60  Resp: 18 18  Temp: 98 F (36.7 C) 98 F (36.7 C)  SpO2: 98% 98%     General: Awake, no distress.   CV:  Good peripheral perfusion.regular rate and  rhythm Resp:  Normal effort. Lungs cta Abd:  No distention.  Nontender Other:  Patient writhing in pain, appears to be spasm-like, however when it starts to radiate into the shoulder patient cries more, T-spine tender to palpation   ED Results / Procedures / Treatments   Labs (all labs ordered are listed, but only abnormal results are displayed) Labs Reviewed  COMPREHENSIVE METABOLIC PANEL WITH GFR - Abnormal; Notable for the following components:      Result Value   Glucose, Bld 131 (*)    BUN 31 (*)     Creatinine, Ser 1.42 (*)    GFR, Estimated 36 (*)    All other components within normal limits  CBC WITH DIFFERENTIAL/PLATELET  LIPASE, BLOOD  TROPONIN I (HIGH SENSITIVITY)  TROPONIN I (HIGH SENSITIVITY)     EKG  EKG   RADIOLOGY CTA chest abdomen pelvis IV contrast, no charge.  T-spine, L-spine    PROCEDURES:   Procedures  Critical Care:  no Chief Complaint  Patient presents with   Back Pain      MEDICATIONS ORDERED IN ED: Medications  lidocaine  (LIDODERM ) 5 % 1 patch (1 patch Transdermal Patch Applied 05/10/24 1404)  acetaminophen  (TYLENOL ) tablet 650 mg (has no administration in time range)    Or  acetaminophen  (TYLENOL ) suppository 650 mg (has no administration in time range)  ondansetron  (ZOFRAN ) tablet 4 mg (has no administration in time range)    Or  ondansetron  (ZOFRAN ) injection 4 mg (has no administration in time range)  heparin  injection 5,000 Units (has no administration  in time range)  docusate sodium (COLACE) capsule 100 mg (has no administration in time range)  morphine  (PF) 4 MG/ML injection 4 mg (has no administration in time range)  fentaNYL  (SUBLIMAZE ) injection 50 mcg (has no administration in time range)  HYDROmorphone  (DILAUDID ) injection 0.5 mg (has no administration in time range)  lactated ringers  infusion (has no administration in time range)  morphine  (PF) 4 MG/ML injection 4 mg (4 mg Intravenous Given 05/10/24 1100)  ondansetron  (ZOFRAN ) injection 4 mg (4 mg Intravenous Given 05/10/24 1100)  morphine  (PF) 4 MG/ML injection 4 mg (4 mg Intravenous Given 05/10/24 1130)  iohexol (OMNIPAQUE) 350 MG/ML injection 75 mL (75 mLs Intravenous Contrast Given 05/10/24 1138)  fentaNYL  (SUBLIMAZE ) injection 50 mcg (50 mcg Intravenous Given 05/10/24 1232)  sodium chloride  0.9 % bolus 500 mL (0 mLs Intravenous Stopped 05/10/24 1334)  HYDROmorphone  (DILAUDID ) injection 0.5 mg (0.5 mg Intravenous Given 05/10/24 1331)  methocarbamol (ROBAXIN) tablet 500 mg  (500 mg Oral Given 05/10/24 1404)     IMPRESSION / MDM / ASSESSMENT AND PLAN / ED COURSE  I reviewed the triage vital signs and the nursing notes.                              Differential diagnosis includes, but is not limited to, AAA, lumbar radiculopathy, thoracic radiculopathy, compression fracture, pancreatitis, acute cholecystitis, kidney stone  Patient's presentation is most consistent with acute presentation with potential threat to life or bodily function.    Medications given: Morphine  4 mg IV additional morphine  4 mg IV and Zofran  4 mg IV as patient's pain was not controlled with just 4 mg of morphine , patient was then given 50 mcg of fentanyl  for pain, this did not control her pain and we gave her 0.5 mg Dilaudid .  Still pain is not controlled.  Will try Lidoderm  patch, muscle relaxer and ice pack  Labs and imaging ordered  Due to concerns for aortic aneurysm I did call CT to hasten her test  Labs are reassuring and are in the patient's normal trend  CT lumbar spine and T-spine independently reviewed interpreted by me as being positive for degenerative changes per the radiologist.  No metastatic disease or fracture.  CTA chest abdomen pelvis for dissection, I did independently review interpret the radiologist readings being positive for large pleural effusion with possible metastatic disease.  No dissection noted  Due to the patient having multiple pain medications without any pain control we will admit the patient for pain control.  Unsure if her pain is coming from metastatic disease versus bone problems.   Consult to hospitalist Spoke with Dr. Reinhold Carbine.  Will be admitting the patient.  She is in stable condition at this time.  Did discuss the concerns of metastatic disease with primary possibly being in the right lung.  Family and patient are in agreement with admission for pain control.  Patient is stable at this time  FINAL CLINICAL IMPRESSION(S) / ED DIAGNOSES   Final  diagnoses:  Acute bilateral thoracic back pain  Pleural effusion  Lung nodule  Inadequate pain control     Rx / DC Orders   ED Discharge Orders     None        Note:  This document was prepared using Dragon voice recognition software and may include unintentional dictation errors.    Delsie Figures, PA-C 05/10/24 1438    Bryson Carbine, MD 05/10/24 [phone]

## 2024-05-10 NOTE — ED Provider Notes (Signed)
 Patient ECG interpreted by me 1106 heart rate 60 QRS 150 QTc 485 Normal sinus rhythm, left bundle branch block.  Compared with previous ECGs left bundle branch block morphology is old.  No significant changes   Iver Marker, MD 05/10/24 1119

## 2024-05-10 NOTE — Assessment & Plan Note (Deleted)
Hydralazine 5 mg IV every 6 hours as needed for SBP greater than 170, 5 days ordered

## 2024-05-10 NOTE — Assessment & Plan Note (Signed)
 Plavix  not resumed on admission, a.m. team to resume when benefits outweigh the risk

## 2024-05-10 NOTE — ED Triage Notes (Addendum)
 Pt states back pain, chronic in nature but pain worse today. Pt denies injury. Pt states she  took OTC meds with minimal relief. Pt is moaning in triage. Family with pt. Pain started at 0200 this morning. Pt denies bowel or urine incontinence. Pt appears to be having spasms.   Pt took OTC tylenol  a few hours ago, pt states allergy to ibuprofen which was offered.

## 2024-05-11 DIAGNOSIS — D696 Thrombocytopenia, unspecified: Secondary | ICD-10-CM | POA: Insufficient documentation

## 2024-05-11 DIAGNOSIS — R52 Pain, unspecified: Secondary | ICD-10-CM | POA: Diagnosis not present

## 2024-05-11 DIAGNOSIS — C3490 Malignant neoplasm of unspecified part of unspecified bronchus or lung: Secondary | ICD-10-CM | POA: Diagnosis not present

## 2024-05-11 DIAGNOSIS — I255 Ischemic cardiomyopathy: Secondary | ICD-10-CM

## 2024-05-11 LAB — BASIC METABOLIC PANEL WITH GFR
Anion gap: 11 (ref 5–15)
BUN: 25 mg/dL — ABNORMAL HIGH (ref 8–23)
CO2: 22 mmol/L (ref 22–32)
Calcium: 8.7 mg/dL — ABNORMAL LOW (ref 8.9–10.3)
Chloride: 104 mmol/L (ref 98–111)
Creatinine, Ser: 1.2 mg/dL — ABNORMAL HIGH (ref 0.44–1.00)
GFR, Estimated: 44 mL/min — ABNORMAL LOW (ref >60)
Glucose, Bld: 132 mg/dL — ABNORMAL HIGH (ref 70–99)
Potassium: 3.7 mmol/L (ref 3.5–5.1)
Sodium: 137 mmol/L (ref 135–145)

## 2024-05-11 LAB — CBC
HCT: 34.7 % — ABNORMAL LOW (ref 36.0–46.0)
Hemoglobin: 11.2 g/dL — ABNORMAL LOW (ref 12.0–15.0)
MCH: 29.8 pg (ref 26.0–34.0)
MCHC: 32.3 g/dL (ref 30.0–36.0)
MCV: 92.3 fL (ref 80.0–100.0)
Platelets: 116 10*3/uL — ABNORMAL LOW (ref 150–400)
RBC: 3.76 MIL/uL — ABNORMAL LOW (ref 3.87–5.11)
RDW: 13.5 % (ref 11.5–15.5)
WBC: 8.6 10*3/uL (ref 4.0–10.5)
nRBC: 0 % (ref 0.0–0.2)

## 2024-05-11 MED ORDER — HYDROCHLOROTHIAZIDE 12.5 MG PO TABS
6.2500 mg | ORAL_TABLET | Freq: Every day | ORAL | Status: DC
  Filled 2024-05-11: qty 1

## 2024-05-11 MED ORDER — HYDRALAZINE HCL 50 MG PO TABS
50.0000 mg | ORAL_TABLET | Freq: Three times a day (TID) | ORAL | Status: DC
  Administered 2024-05-12 – 2024-05-14 (×7): 50 mg via ORAL
  Filled 2024-05-11 (×9): qty 1

## 2024-05-11 MED ORDER — ATORVASTATIN CALCIUM 10 MG PO TABS
10.0000 mg | ORAL_TABLET | Freq: Every day | ORAL | Status: DC
  Administered 2024-05-12 – 2024-05-14 (×3): 10 mg via ORAL
  Filled 2024-05-11 (×4): qty 1

## 2024-05-11 MED ORDER — METHOCARBAMOL 500 MG PO TABS
500.0000 mg | ORAL_TABLET | Freq: Four times a day (QID) | ORAL | Status: DC | PRN
  Administered 2024-05-12 – 2024-05-14 (×7): 500 mg via ORAL
  Filled 2024-05-11 (×9): qty 1

## 2024-05-11 MED ORDER — METHOCARBAMOL 1000 MG/10ML IJ SOLN
1000.0000 mg | Freq: Once | INTRAMUSCULAR | Status: AC
  Filled 2024-05-11: qty 10

## 2024-05-11 MED ORDER — CLOPIDOGREL BISULFATE 75 MG PO TABS
75.0000 mg | ORAL_TABLET | Freq: Every day | ORAL | Status: DC
  Administered 2024-05-12 – 2024-05-14 (×3): 75 mg via ORAL
  Filled 2024-05-11 (×4): qty 1

## 2024-05-11 MED ORDER — SENNOSIDES-DOCUSATE SODIUM 8.6-50 MG PO TABS
2.0000 | ORAL_TABLET | Freq: Two times a day (BID) | ORAL | Status: DC
  Administered 2024-05-12 – 2024-05-14 (×5): 2 via ORAL
  Filled 2024-05-11 (×7): qty 2

## 2024-05-11 MED ORDER — ENSURE PLUS HIGH PROTEIN PO LIQD
237.0000 mL | Freq: Two times a day (BID) | ORAL | Status: DC
  Administered 2024-05-12 – 2024-05-14 (×5): 237 mL via ORAL

## 2024-05-11 MED ORDER — BISOPROLOL-HYDROCHLOROTHIAZIDE 10-6.25 MG PO TABS
1.0000 | ORAL_TABLET | Freq: Every day | ORAL | Status: DC
  Filled 2024-05-11: qty 1

## 2024-05-11 MED ORDER — BISOPROLOL FUMARATE 5 MG PO TABS
10.0000 mg | ORAL_TABLET | Freq: Every day | ORAL | Status: DC
  Administered 2024-05-11 – 2024-05-14 (×4): 10 mg via ORAL
  Filled 2024-05-11 (×4): qty 2

## 2024-05-11 MED ORDER — LIDOCAINE 5 % EX PTCH
1.0000 | MEDICATED_PATCH | CUTANEOUS | Status: DC
  Administered 2024-05-12 – 2024-05-14 (×3): 1 via TRANSDERMAL
  Filled 2024-05-11 (×4): qty 1

## 2024-05-11 MED ORDER — PREDNISONE 20 MG PO TABS
20.0000 mg | ORAL_TABLET | Freq: Every day | ORAL | Status: DC
  Administered 2024-05-12 – 2024-05-14 (×3): 20 mg via ORAL
  Filled 2024-05-11 (×4): qty 1

## 2024-05-11 MED ORDER — CALCIUM CARBONATE ANTACID 500 MG PO CHEW
1.0000 | CHEWABLE_TABLET | Freq: Three times a day (TID) | ORAL | Status: DC | PRN
  Administered 2024-05-12 – 2024-05-13 (×2): 200 mg via ORAL
  Filled 2024-05-11 (×3): qty 1

## 2024-05-11 NOTE — Progress Notes (Signed)
  Progress Note   Patient: Julie Mcbride BJY:[id number] DOB: [birth year] DOA: 05/10/2024     1 DOS: the patient was seen and examined on 05/11/2024   Brief hospital course: Ms. Tabathia Knoche is a 88 year old female hypertension, ischemic cardiomyopathy, who presents emergency department for chief concerns of worsening low back pain.  Back pain started suddenly after she woke up from sleep. CT spine showed mild degenerative changes, CT angiogram of the chest did not show any aortic aneurysm or dissection.  However, did show multiple nodules suspected metastatic lung cancer. Due to severity of the pain, patient was placed on hydromorphone  PCA.  Patient was also seen by oncology.   Principal Problem:   Inadequate pain control Active Problems:   Essential hypertension   Ischemic cardiomyopathy   Renovascular hypertension   History of renal artery stenosis   Lung cancer (HCC)   Thrombocytopenia (HCC)   Assessment and Plan: Severe back pain. * Inadequate pain control Based on CT scan results of the spine, no evidence of cancer metastasis to the spine.  Patient has degenerative disc disease.  Severe back pain may be related to muscle strain and degenerative disc disease.  Patient was started on PCA, will continue for day.  Also started prednisone , Lidoderm  patch.  Planning to discontinue PCA and transition to oral by tomorrow once pain is better. Patient has some constipation, started senna. Patient has allergy to NSAIDs, will not start Toradol . Physical therapy tomorrow.  Ischemic cardiomyopathy Chronic systolic congestive heart failure. Echocardiogram in 2022 showed ejection fraction 45 to 50%.  Currently patient does not have any volume overload.  I have discontinued IV fluids.  Essential hypertension Resume home treatment.  Lung cancer (HCC) Multiple lung nodules on CT scan suspect lung cancer.  Seen by Dr. Adrian Alba.      Subjective:  Patient still complains significant  lower back pain.  No nausea vomiting.  Some constipation.  Physical Exam: Vitals:   05/11/24 0401 05/11/24 0425 05/11/24 0820 05/11/24 0900  BP: 138/67   (!) 142/57  Pulse: 67   62  Resp: 12 (!) 9 12 20   Temp: 97.8 F (36.6 C)   98 F (36.7 C)  TempSrc: Oral     SpO2: 97% 98% 100% 97%  Weight:      Height:       General exam: Appears calm and comfortable  Respiratory system: Clear to auscultation. Respiratory effort normal. Cardiovascular system: S1 & S2 heard, RRR. No JVD, murmurs, rubs, gallops or clicks. No pedal edema. Gastrointestinal system: Abdomen is nondistended, soft and nontender. No organomegaly or masses felt. Normal bowel sounds heard. Central nervous system: Alert and oriented x3. No focal neurological deficits. Extremities: Symmetric 5 x 5 power. Skin: No rashes, lesions or ulcers Psychiatry: Judgement and insight appear normal. Mood & affect appropriate.  Tenderness in the mid lumbar spine.  Data Reviewed:  CT scan lab results reviewed.  Family Communication: Son updated at bedside.  Disposition: Status is: Inpatient Remains inpatient appropriate because: Severity of disease, IV treatment.     Time spent: 35 minutes  Author: Donaciano Frizzle, MD 05/11/2024 11:34 AM  For on call review [email or web address].

## 2024-05-11 NOTE — Plan of Care (Signed)
  Problem: Education: Goal: Knowledge of General Education information will improve Description: Including pain rating scale, medication(s)/side effects and non-pharmacologic comfort measures Outcome: Progressing   Problem: Health Behavior/Discharge Planning: Goal: Ability to manage health-related needs will improve Outcome: Progressing   Problem: Clinical Measurements: Goal: Ability to maintain clinical measurements within normal limits will improve Outcome: Progressing   Problem: Elimination: Goal: Will not experience complications related to bowel motility Outcome: Progressing

## 2024-05-11 NOTE — Consult Note (Signed)
 [facility]  Telephone:([phone]) [phone] Fax:([phone]  ID: Horacio Lyell OB: Sep 30, 1936  MR#: [id number]  AOZ#:[id number]  Patient Care Team: Monique Ano, MD as PCP - General (Family Medicine)  CHIEF COMPLAINT: Bilateral lung lesion suspected of malignancy.  INTERVAL HISTORY: Patient is an 88 year old female who recently presented to the emergency room complaining of significant back pain.  Further workup revealed multiple bilateral lung lesions highly suspicious for underlying malignancy.  She continues to have pain, but admits it has improved since admission.  She otherwise feels well.  Is no neurologic complaints.  She denies any recent fevers or illnesses.  She has a good appetite and denies weight loss.  She has no chest pain, shortness of breath, cough, or hemoptysis.  She denies any nausea, vomiting, constipation, or diarrhea.  She has no urinary complaints.  Patient offers no further specific complaints today.   REVIEW OF SYSTEMS:   Review of Systems  Constitutional:  Positive for malaise/fatigue. Negative for fever and weight loss.  Respiratory: Negative.  Negative for cough, hemoptysis and shortness of breath.   Cardiovascular: Negative.  Negative for chest pain and leg swelling.  Gastrointestinal: Negative.  Negative for abdominal pain.  Genitourinary: Negative.  Negative for dysuria.  Musculoskeletal:  Positive for back pain.  Skin: Negative.  Negative for rash.  Neurological:  Positive for weakness. Negative for dizziness, focal weakness and headaches.  Psychiatric/Behavioral: Negative.  The patient is not nervous/anxious.     As per HPI. Otherwise, a complete review of systems is negative.  PAST MEDICAL HISTORY: Past Medical History:  Diagnosis Date   Arthritis    Knees, hands   Basal cell carcinoma    back - removed   Bladder polyp    With previos hematuria   Diverticulitis    Dyspnea on exertion    Chronic   GERD  (gastroesophageal reflux disease)    History of ETT    Myoview 7/10 EF 78%, no ischemia or infarction. Poor exercise tolerance (2'30, stopped due to fatigue). Reached 86% MPHR.   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 Aggie Horton   BACK SURGERY     BASAL CELL CARCINOMA EXCISION     back   BREAST BIOPSY Right 06/19/2017   Affirm Bx- Radial Scar   BREAST EXCISIONAL BIOPSY Right 1996   neg   BREAST LUMPECTOMY WITH NEEDLE LOCALIZATION Right 08/11/2017   Procedure: BREAST LUMPECTOMY WITH NEEDLE LOCALIZATION;  Surgeon: Benancio Bracket, MD;  Location: [facility];  Service: General;  Laterality: Right;   CARPAL TUNNEL RELEASE Right    CATARACT EXTRACTION W/ INTRAOCULAR LENS  IMPLANT, BILATERAL     EXCISION OF BREAST BIOPSY Right 08/11/2017   excision of radial Scar   EYE SURGERY     JOINT REPLACEMENT Right    knee   KNEE ARTHROSCOPY Right 04/22/2016   Procedure: ARTHROSCOPY KNEE;  Surgeon: Josephus Nida, MD;  Location: [facility];  Service: Orthopedics;  Laterality: Right;   knee replacement Right 01/11/2017   NASAL SINUS SURGERY  06/05/14  Dr. Donnie Galea   POSTERIOR CERVICAL LAMINECTOMY  2005   C5-7; plate and screws   RENAL ANGIOGRAPHY Left 04/20/2020   Procedure: RENAL ANGIOGRAPHY;  Surgeon: Celso College, MD;  Location: [facility];  Service: Cardiovascular;  Laterality: Left;   TONSILLECTOMY      FAMILY HISTORY: Family History  Problem Relation Age of Onset   Ovarian cancer Mother    Stroke Father        CVA   Breast cancer Daughter 67   Breast cancer Maternal Aunt 44   Breast cancer Maternal Aunt 90    Prostate cancer Neg Hx    Bladder Cancer Neg Hx    Kidney cancer Neg Hx     ADVANCED DIRECTIVES (Y/N):  @ADVDIR @  HEALTH MAINTENANCE: Social History   Tobacco Use   Smoking status: Never   Smokeless tobacco: Never  Vaping Use   Vaping status: Never Used  Substance Use Topics   Alcohol use: No   Drug use: No     Colonoscopy:  PAP:  Bone density:  Lipid panel:  Allergies  Allergen Reactions   Chlorhexidine Hives    SAGE wipes caused severe rash   Cyclobenzaprine Hcl     Doesn't remember reaction   Etodolac     Doesn't remember reaction   Lisinopril     Coughing up blood   Meloxicam     Doesn't remember reaction   Metaxalone     Doesn't remember reaction   Neomycin-Bacitracin Zn-Polymyx     Skin irritation   Nifedipine Swelling   Nitrofurantoin     Doesn't remember reaction   Sulfamethoxazole-Trimethoprim Hives   Sulfonamide Derivatives Hives   Tizanidine     Liver problems   Vasotec [Enalapril Maleate]     Doesn't remember reaction   Ibuprofen Rash   Tape Rash    Current Facility-Administered Medications  Medication Dose Route Frequency Provider Last Rate Last Admin   acetaminophen  (TYLENOL ) tablet 650 mg  650 mg Oral Q6H PRN Cox, Amy N, DO       Or   acetaminophen  (TYLENOL ) suppository 650 mg  650 mg Rectal Q6H PRN Cox, Amy N, DO       diphenhydrAMINE  (BENADRYL ) injection 12.5 mg  12.5 mg Intravenous Q6H PRN Cox, Amy N, DO       Or   diphenhydrAMINE  (BENADRYL ) 12.5 MG/5ML elixir 12.5 mg  12.5 mg Oral Q6H PRN Cox, Amy N, DO       donepezil (ARICEPT) tablet 5 mg  5 mg Oral QHS Cox, Amy N, DO   5 mg at 05/10/24 2219   feeding supplement (ENSURE PLUS HIGH PROTEIN) liquid 237 mL  237 mL Oral BID BM Zhang, Dekui, MD       fluticasone (FLONASE) 50 MCG/ACT nasal spray 2 spray  2 spray Each Nare PRN Cox, Amy N, DO       heparin  injection 5,000 Units  5,000 Units Subcutaneous Q8H Cox, Amy N, DO   5,000 Units at 05/11/24 0540   hydrALAZINE  (APRESOLINE ) injection  5 mg  5 mg Intravenous Q6H PRN Cox, Amy N, DO       HYDROmorphone  (DILAUDID ) 1 mg/mL PCA injection   Intravenous Q4H Cox, Amy N, DO   Received at 05/11/24 0820   lidocaine  (LIDODERM ) 5 % 1 patch  1 patch Transdermal Q24H Donaciano Frizzle, MD       naloxone (NARCAN) injection 0.4 mg  0.4 mg Intravenous PRN Cox, Amy N, DO  And   sodium chloride  flush (NS) 0.9 % injection 9 mL  9 mL Intravenous PRN Cox, Amy N, DO       ondansetron  (ZOFRAN ) injection 4 mg  4 mg Intravenous Q6H PRN Cox, Amy N, DO       predniSONE  (DELTASONE ) tablet 20 mg  20 mg Oral Q breakfast Donaciano Frizzle, MD       rOPINIRole  (REQUIP ) tablet 0.5 mg  0.5 mg Oral QHS Cox, Amy N, DO   0.5 mg at 05/10/24 2219   senna-docusate (Senokot-S) tablet 2 tablet  2 tablet Oral BID Donaciano Frizzle, MD        OBJECTIVE: Vitals:   05/11/24 0820 05/11/24 0900  BP:  (!) 142/57  Pulse:  62  Resp: 12 20  Temp:  98 F (36.7 C)  SpO2: 100% 97%     Body mass index is 35.12 kg/m.    ECOG FS:2 - Symptomatic, <50% confined to bed  General: Well-developed, well-nourished, no acute distress. Eyes: Pink conjunctiva, anicteric sclera. HEENT: Normocephalic, moist mucous membranes. Lungs: No audible wheezing or coughing. Heart: Regular rate and rhythm. Abdomen: Soft, nontender, no obvious distention. Musculoskeletal: No edema, cyanosis, or clubbing. Neuro: Alert, answering all questions appropriately. Cranial nerves grossly intact. Skin: No rashes or petechiae noted. Psych: Normal affect. Lymphatics: No cervical, calvicular, axillary or inguinal LAD.   LAB RESULTS:  Lab Results  Component Value Date   NA 137 05/11/2024   K 3.7 05/11/2024   CL 104 05/11/2024   CO2 22 05/11/2024   GLUCOSE 132 (H) 05/11/2024   BUN 25 (H) 05/11/2024   CREATININE 1.20 (H) 05/11/2024   CALCIUM  8.7 (L) 05/11/2024   PROT 6.8 05/10/2024   ALBUMIN 3.8 05/10/2024   AST 28 05/10/2024   ALT 22 05/10/2024   ALKPHOS 77 05/10/2024   BILITOT 1.0 05/10/2024    GFRNONAA 44 (L) 05/11/2024   GFRAA 60 (L) 04/20/2020    Lab Results  Component Value Date   WBC 8.6 05/11/2024   NEUTROABS 7.7 05/10/2024   HGB 11.2 (L) 05/11/2024   HCT 34.7 (L) 05/11/2024   MCV 92.3 05/11/2024   PLT 116 (L) 05/11/2024     STUDIES: CT Angio Chest/Abd/Pel for Dissection W and/or Wo Contrast Result Date: 05/10/2024 CLINICAL DATA:  Acute right back and shoulder pain at 2 a.m. today. Clinical concern for aortic dissection. EXAM: CT ANGIOGRAPHY CHEST, ABDOMEN AND PELVIS TECHNIQUE: Multidetector CT imaging through the chest, abdomen and pelvis was performed using the standard protocol during bolus administration of intravenous contrast. Multiplanar reconstructed images and MIPs were obtained and reviewed to evaluate the vascular anatomy. RADIATION DOSE REDUCTION: This exam was performed according to the departmental dose-optimization program which includes automated exposure control, adjustment of the mA and/or kV according to patient size and/or use of iterative reconstruction technique. CONTRAST:  75mL OMNIPAQUE IOHEXOL 350 MG/ML SOLN COMPARISON:  Chest CT dated 09/09/2013. Chest radiographs dated 03/09/2023 and 12/25/2020 and chest CTA dated 09/08/2013. FINDINGS: CTA CHEST FINDINGS Cardiovascular: Mild atheromatous calcifications including the thoracic aorta. No aortic aneurysm or dissection. Normally opacified pulmonary arteries with no pulmonary arterial filling defects. Mildly enlarged heart with left atrial and left ventricular dilatation. No pericardial effusion. Mediastinum/Nodes: Moderate to large-sized hiatal hernia is again demonstrated. Unremarkable thyroid gland and trachea. Mildly enlarged precarinal node with a short axis diameter of 10 mm on image number 55/6, previously 8 mm. Mildly enlarged right hilar nodes, the largest with a short axis diameter of 11 mm on image number 60/6.  Mildly enlarged subcarinal node with a short axis diameter of 10 mm on image number 63/6.  Lungs/Pleura: Multiple interval bilateral lung nodules involving all lobes of both lungs. The largest on the right measures 1.6 x 1.3 cm on image number 79/7 in the largest on the left is in the left lower lobe, measuring 1.7 x 1.1 cm on image number 86/7. One nodule is more spiculated than the others. This is in the right lower lobe, measuring 1.7 x 1.1 cm on image number 93/7. Interval small right pleural effusion. The previously demonstrated ground-glass opacity on the right has resolved. Musculoskeletal: Thoracic spine degenerative changes and cervical spine fixation hardware. No evidence of bony metastatic disease. Review of the MIP images confirms the above findings. CTA ABDOMEN AND PELVIS FINDINGS VASCULAR Aorta: Mild atheromatous calcifications.  No aneurysm or dissection. Celiac: Patent without evidence of aneurysm, dissection, vasculitis or significant stenosis. SMA: Patent without evidence of aneurysm, dissection, vasculitis or significant stenosis. Renals: Proximal left renal artery stent. This is patent. Normal-appearing right renal artery. IMA: Patent without evidence of aneurysm, dissection, vasculitis or significant stenosis. Inflow: Patent without evidence of aneurysm, dissection, vasculitis or significant stenosis. Veins: No obvious venous abnormality within the limitations of this arterial phase study. Review of the MIP images confirms the above findings. NON-VASCULAR Hepatobiliary: No significant change in previously demonstrated liver cysts. No evidence of liver metastatic disease. Normal-appearing gallbladder. Pancreas: Unremarkable. No pancreatic ductal dilatation or surrounding inflammatory changes. Spleen: Normal in size without focal abnormality. Adrenals/Urinary Tract: 1.4 x 1.0 cm low to medium density left adrenal nodule on image number 58/5 is smaller than on 09/09/2013, measuring 1.8 x 1.3 cm at that time. This currently measures 20 Hounsfield units in density and previously measured  18 Hounsfield units in density. Normal-appearing right adrenal gland. 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 This does not need imaging follow-up. Two adjacent 4 mm calculi in the mid right kidney. Normal-appearing ureters. Small left bladder diverticulum. No hydronephrosis. Stomach/Bowel: Multiple diverticula throughout the colon, most numerous in the sigmoid colon. No evidence of diverticulitis. Stable moderately large hiatal hernia. Unremarkable appendix. Lymphatic: No enlarged lymph nodes. Reproductive: Status post hysterectomy. No adnexal masses. Other: Moderate-sized bilateral inguinal hernias containing fat. Musculoskeletal: Mild lumbar spine degenerative changes. No evidence of bony metastatic disease. Review of the MIP images confirms the above findings. IMPRESSION: 1. No aortic aneurysm or dissection. 2. Multiple interval bilateral lung nodules, compatible with metastatic disease with a possible right lower lobe primary. A multinodular infectious process is less likely. 3. Mild mediastinal and right hilar lymphadenopathy, compatible with metastatic disease. Reactive adenopathy is less likely. 4. Interval small right pleural effusion. 5. Mild cardiomegaly with left atrial and left ventricular dilatation. 6. Moderate to large-sized hiatal hernia. 7. Colonic diverticulosis. 8. Nonobstructing right renal calculi. 9. Moderate-sized bilateral inguinal hernias containing fat. 10. 1.4 x 1.0 cm low to medium density left adrenal nodule, smaller than on 09/09/2013, compatible with a benign adenoma not needing imaging follow-up. 11. Aortic atherosclerosis. Aortic Atherosclerosis (ICD10-I70.0). Electronically Signed   By: Catherin Closs M.D.   On: 05/10/2024 13:30   CT Thoracic Spine Wo Contrast Result Date: 05/10/2024 CLINICAL DATA:  Acute right back to shoulder pain. EXAM: CT THORACIC SPINE WITHOUT CONTRAST TECHNIQUE: Multidetector CT images of the thoracic were  obtained using the standard protocol without intravenous contrast. RADIATION DOSE REDUCTION: This exam was performed according to the departmental dose-optimization program which includes automated exposure control, adjustment of the  mA and/or kV according to patient size and/or use of iterative reconstruction technique. COMPARISON:  None Available. FINDINGS: Alignment: Mild-to-moderate kyphosis. Vertebrae: Vertebrae are intact and appear normally mineralized. No lesions are evident. Patient is status post interbody fusion and anterior spinal fixation at C5-6 and C6-7. Paraspinal and other soft tissues: The immediate paraspinous soft tissues are unremarkable. There are pulmonary nodules present and bilateral pleural effusions, worse on the right. There is also moderate hiatus hernia. Disc levels: There is mild disc space narrowing present throughout the thoracic spine. There is no apparent disc herniation or significant spinal canal or neural foraminal stenosis. There is no apparent acute process. IMPRESSION: 1. Mild diffuse degenerative disc disease throughout the thoracic spine without significant spinal canal or neural foraminal stenosis. 2. Numerous pulmonary nodules and bilateral pleural effusions, which will be more definitively assessed in the accompanying CT of the chest. Electronically Signed   By: Maribeth Shivers M.D.   On: 05/10/2024 12:34   CT Lumbar Spine Wo Contrast Result Date: 05/10/2024 CLINICAL DATA:  Acute lower back pain. EXAM: CT LUMBAR SPINE WITHOUT CONTRAST TECHNIQUE: Multidetector CT imaging of the lumbar spine was performed without intravenous contrast administration. Multiplanar CT image reconstructions were also generated. RADIATION DOSE REDUCTION: This exam was performed according to the departmental dose-optimization program which includes automated exposure control, adjustment of the mA and/or kV according to patient size and/or use of iterative reconstruction technique.  COMPARISON:  None Available. FINDINGS: Segmentation: The L1 vertebral body has hypoplastic ribs. Alignment: Slight anterolisthesis at L5-S1. Mild dextrocurvature of the thoracolumbar junction. Vertebrae: Vertebral bodies are intact and maintain their height. No lesions are evident. Paraspinal and other soft tissues: There is no paravertebral soft tissue swelling or hematoma evident. There are nonobstructive calculi present within the right kidney. There is also moderate right renal cortical atrophy and scarring. A fat density lesion is present in the superior pole of the left kidney. There is a left renal artery stent. There are numerous sigmoid diverticula present. Disc levels: L1-2: Minimal disc bulging and mild facet hypertrophy. No significant spinal canal or neural foraminal stenosis. L2-3: Mild disc bulging and mild facet arthrosis. No significant spinal canal or neural foraminal stenosis. L3-4: Mild diffuse disc bulging and mild to moderate bilateral facet arthrosis, with mild-to-moderate central spinal canal stenosis and bilateral lateral recess stenosis. L4-5: Mild disc bulging and mild bilateral facet arthrosis. Mild right lateral recess and neural foraminal stenosis. L5-S1: Slight degenerative anterolisthesis and severe bilateral facet arthrosis. No significant spinal canal or neural foraminal stenosis. IMPRESSION: 1. Mild diffuse degenerative disc disease and multilevel facet arthrosis, which is most severe at L5-S1. 2. Right nephrolithiasis. 3. Colonic diverticulosis. Electronically Signed   By: Maribeth Shivers M.D.   On: 05/10/2024 12:27    ASSESSMENT: Bilateral lung lesion suspected of malignancy.  PLAN:    Bilateral lung lesion suspected of malignancy: CT scan of the chest, abdomen, pelvis completed on 01/11/2024 reviewed independently and report as above revealed multiple bilateral lung nodules the largest on the right lung measuring 1.6 x 1.3 cm.  The largest in left side measures 1.7 x  1.1.  Patient has mild mediastinal lymphadenopathy which may be inflammatory, but no other evidence of disease.  She has not had a mammogram since 2019.  Of note, patient had a breast biopsy on June 19, 2017 that did not reveal malignancy, but pathology consistent with a radial scar.  Radial scars have been known to increase the risk of breast cancer.  Will order tumor  markers for completeness, but ultimately patient may require biopsy to confirm malignancy and diagnosis. Back pain: Does not appear to be related to underlying malignancy.  Continue symptomatic treatment. Renal insufficiency: Patient's creatinine is improving and is now 1.20.  Monitor. Anemia: Mild, monitor. Thrombocytopenia: Mild, monitor.  Appreciate consult, will follow.  Shellie Dials, MD   05/11/2024 9:41 AM

## 2024-05-12 DIAGNOSIS — R52 Pain, unspecified: Secondary | ICD-10-CM | POA: Diagnosis not present

## 2024-05-12 DIAGNOSIS — I10 Essential (primary) hypertension: Secondary | ICD-10-CM

## 2024-05-12 DIAGNOSIS — C3490 Malignant neoplasm of unspecified part of unspecified bronchus or lung: Secondary | ICD-10-CM | POA: Diagnosis not present

## 2024-05-12 LAB — URINALYSIS, COMPLETE (UACMP) WITH MICROSCOPIC
Bilirubin Urine: NEGATIVE
Glucose, UA: NEGATIVE mg/dL
Hgb urine dipstick: NEGATIVE
Ketones, ur: NEGATIVE mg/dL
Nitrite: POSITIVE — AB
Protein, ur: NEGATIVE mg/dL
Specific Gravity, Urine: 1.015 (ref 1.005–1.030)
WBC, UA: >50 WBC/hpf (ref 0–5)
pH: 5 (ref 5.0–8.0)

## 2024-05-12 LAB — CANCER ANTIGEN 27.29: CA 27.29: 19.4 U/mL (ref 0.0–38.6)

## 2024-05-12 MED ORDER — OXYCODONE-ACETAMINOPHEN 5-325 MG PO TABS
1.0000 | ORAL_TABLET | ORAL | Status: DC | PRN
  Administered 2024-05-12 – 2024-05-14 (×8): 1 via ORAL
  Filled 2024-05-12 (×8): qty 1

## 2024-05-12 MED ORDER — HYDROMORPHONE HCL 1 MG/ML IJ SOLN
2.0000 mg | Freq: Once | INTRAMUSCULAR | Status: AC
  Administered 2024-05-12: 2 mg via INTRAVENOUS
  Filled 2024-05-12: qty 2

## 2024-05-12 MED ORDER — LACTULOSE 10 GM/15ML PO SOLN
20.0000 g | Freq: Once | ORAL | Status: AC
  Administered 2024-05-12: 20 g via ORAL
  Filled 2024-05-12: qty 30

## 2024-05-12 MED ORDER — HYDROMORPHONE HCL 1 MG/ML IJ SOLN
0.5000 mg | INTRAMUSCULAR | Status: DC | PRN
  Administered 2024-05-12: 0.5 mg via INTRAVENOUS
  Filled 2024-05-12: qty 0.5

## 2024-05-12 NOTE — Progress Notes (Signed)
  Progress Note   Patient: Julie Mcbride WUJ:[id number] DOB: 05-05-1936 DOA: 05/10/2024     2 DOS: the patient was seen and examined on 05/12/2024   Brief hospital course: Ms. Ariyana Faw is a 88 year old female hypertension, ischemic cardiomyopathy, who presents emergency department for chief concerns of worsening low back pain.  Back pain started suddenly after she woke up from sleep. CT spine showed mild degenerative changes, CT angiogram of the chest did not show any aortic aneurysm or dissection.  However, did show multiple nodules suspected metastatic lung cancer. Due to severity of the pain, patient was placed on hydromorphone  PCA.  Patient was also seen by oncology.   Principal Problem:   Inadequate pain control Active Problems:   CKD stage 3a, GFR 45-59 ml/min (HCC)   Essential hypertension   Ischemic cardiomyopathy   Renovascular hypertension   History of renal artery stenosis   Lung cancer (HCC)   Thrombocytopenia (HCC)   Assessment and Plan:  Severe back pain. * Inadequate pain control Based on CT scan results of the spine, no evidence of cancer metastasis to the spine.  Patient has degenerative disc disease.  Severe back pain may be related to muscle strain and degenerative disc disease.  Patient was started on PCA.  Also started prednisone , Lidoderm  patch.  Patient was also given muscle relaxer. Condition much improved today, discontinue PCA, start IV and oral as needed pain medicine.  Started PT/OT. Patient also has constipation from pain medicine, give lactulose.   Ischemic cardiomyopathy Chronic systolic congestive heart failure. Echocardiogram in 2022 showed ejection fraction 45 to 50%.  Still has no evidence of volume overload.  Continue to follow.   Essential hypertension Resume home treatment.   Lung cancer (HCC) Multiple lung nodules on CT scan suspect lung cancer.   Patient has evaluated by oncology, ordered tumor marker.  Planning outpatient  follow-up.       Subjective:  Patient feels better, still has pain with movement.  No nausea vomiting.  Feel constipated, has not had a bowel movement for the last 3 days.  Physical Exam: Vitals:   05/12/24 0312 05/12/24 0744 05/12/24 0940 05/12/24 1043  BP:   (!) 155/58   Pulse:   69   Resp: 13 12 16    Temp:   99 F (37.2 C)   TempSrc:   Oral   SpO2: 99% 99% 97% 93%  Weight:      Height:       General exam: Appears calm and comfortable  Respiratory system: Clear to auscultation. Respiratory effort normal. Cardiovascular system: S1 & S2 heard, RRR. No JVD, murmurs, rubs, gallops or clicks. No pedal edema. Gastrointestinal system: Abdomen is nondistended, soft and nontender. No organomegaly or masses felt. Normal bowel sounds heard. Central nervous system: Alert and oriented. No focal neurological deficits. Extremities: Symmetric 5 x 5 power. Skin: No rashes, lesions or ulcers Psychiatry: Judgement and insight appear normal. Mood & affect appropriate.    Data Reviewed:  Lab results reviewed.  Family Communication: Son and husband updated at bedside.  Disposition: Status is: Inpatient Remains inpatient appropriate because: Severity of disease, IV treatment.     Time spent: 35 minutes  Author: Donaciano Frizzle, MD 05/12/2024 11:12 AM  For on call review [email or web address].

## 2024-05-12 NOTE — Evaluation (Signed)
 Occupational Therapy Evaluation Patient Details Name: Julie Mcbride MRN: [id number] DOB: Jan 12, 1936 Today's Date: 05/12/2024   History of Present Illness   Pt is a 88 year old female hypertension, ischemic cardiomyopathy, who presents ED for chief concerns of worsening low back pain. Back pain started suddenly after she woke up from sleep.     Clinical Impressions Pt was seen for OT evaluation this date. PTA, pt resides in a 1 level home with 3 STE and one HR with her husband. She reports she is ambulatory with SPC at baseline within the home, to go to the church and push grocery cart in store. Pt is MOD I with ADLs and her and her husband split household chores. He does assist with donning socks at times.  Pt presents to acute OT demonstrating impaired ADL performance and functional mobility 2/2 weakness, pain, balance deficits and low activity tolerance. Spouse present and willing to assist with all needs. Pt currently requires MIN/CGA for bed mobility using rails with cues for sequencing-edu on logroll technique for comfort. Able to sit at EOB with SBA, needed total assist to don bil socks and Min A to don pull-up over her feet and Min A for standing balance safety. Posterior lean and instability noted in standing without UE support. Multiple episodes of urine incontinence requiring multiple STS from EOB with Min/mod A and increased cueing to utilize HHA. HHA x1 with Min/mod A for safety with SPT to recliner. Pt admits to mild lightheadedness as this is the first time she has been OOB in 2 days. Recommend RW use for further mobility trials.  Pt would benefit from skilled OT services to address noted impairments and functional limitations to maximize safety and independence while minimizing falls risk and caregiver burden. Do anticipate the need for follow up OT services upon acute hospital DC, <3hrs /day.      If plan is discharge home, recommend the following:   A little help with  walking and/or transfers;A lot of help with bathing/dressing/bathroom;Assistance with cooking/housework;Help with stairs or ramp for entrance     Functional Status Assessment   Patient has had a recent decline in their functional status and demonstrates the ability to make significant improvements in function in a reasonable and predictable amount of time.     Equipment Recommendations   None recommended by OT     Recommendations for Other Services         Precautions/Restrictions   Precautions Precautions: Fall Recall of Precautions/Restrictions: Intact Restrictions Weight Bearing Restrictions Per Provider Order: No     Mobility Bed Mobility Overal bed mobility: Needs Assistance Bed Mobility: Rolling, Sidelying to Sit Rolling: Contact guard assist Sidelying to sit: Contact guard assist       General bed mobility comments: increased time, effort and verb cues to use bed rails; able to forward scoot to EOB    Transfers Overall transfer level: Needs assistance Equipment used: 1 person hand held assist Transfers: Sit to/from Stand Sit to Stand: Min assist, Mod assist           General transfer comment: assist for safety for x3 STS from EOB d/t urine incont episodes with post lean d/t imbalance; able to take a few steps to turn to sit in recliner with Min A x1 with HHA; pt with mild dizziness noted with standing      Balance Overall balance assessment: Needs assistance Sitting-balance support: Feet supported, Single extremity supported Sitting balance-Leahy Scale: Fair Sitting balance - Comments: SBA for seated balance  on EOB   Standing balance support: During functional activity, Single extremity supported Standing balance-Leahy Scale: Poor Standing balance comment: poor dynamic standing balance during LB dressing                           ADL either performed or assessed with clinical judgement   ADL Overall ADL's : Needs  assistance/impaired                     Lower Body Dressing: Minimal assistance;Sitting/lateral leans;Sit to/from stand Lower Body Dressing Details (indicate cue type and reason): to don pull up Toilet Transfer: Minimal assistance Toilet Transfer Details (indicate cue type and reason): simulated to recliner with HHA x1                 Vision         Perception         Praxis         Pertinent Vitals/Pain Pain Assessment Pain Assessment: 0-10 Pain Score: 6  Pain Location: R lower back pain Pain Descriptors / Indicators: Aching, Shooting Pain Intervention(s): Monitored during session, Repositioned, Limited activity within patient's tolerance     Extremity/Trunk Assessment Upper Extremity Assessment Upper Extremity Assessment: Overall WFL for tasks assessed   Lower Extremity Assessment Lower Extremity Assessment: Generalized weakness       Communication Communication Communication: No apparent difficulties   Cognition Arousal: Alert Behavior During Therapy: WFL for tasks assessed/performed Cognition: No apparent impairments                               Following commands: Intact       Cueing  General Comments      urine incont episodes   Exercises Other Exercises Other Exercises: Edu on role of OT in acute setting and follow up therapies.   Shoulder Instructions      Home Living Family/patient expects to be discharged to:: Private residence Living Arrangements: Spouse/significant other Available Help at Discharge: Family;Available 24 hours/day Type of Home: House Home Access: Stairs to enter [employer] of Steps: 3 in the back; front has 4 STE but mostly uses back through garage Entrance Stairs-Rails: Left Home Layout: One level     Bathroom Shower/Tub: Producer, television/film/video: Handicapped height     Home Equipment: Shower seat - built in;Cane - single point;Hand held Barista (2 wheels);Grab bars - tub/shower;Rollator (4 wheels)          Prior Functioning/Environment Prior Level of Function : Independent/Modified Independent;History of Falls (last six months)             Mobility Comments: typically walks with SPC in the home and to church; 1 fall at church ~2 months ago when legs gave out; reports able to walk through grocery store pushing cart ADLs Comments: IND with ADLs, team effort with husband for IADLS; typically stands for shower    OT Problem List: Decreased strength;Decreased activity tolerance;Impaired balance (sitting and/or standing);Pain   OT Treatment/Interventions: Self-care/ADL training;Therapeutic exercise;Therapeutic activities;Patient/family education;DME and/or AE instruction;Balance training      OT Goals(Current goals can be found in the care plan section)   Acute Rehab OT Goals Patient Stated Goal: improve pain and strength OT Goal Formulation: With patient/family Time For Goal Achievement: 05/26/24 Potential to Achieve Goals: Fair ADL Goals Pt Will Perform Lower Body Bathing: with contact guard assist;with  supervision;sitting/lateral leans;sit to/from stand Pt Will Perform Lower Body Dressing: with min assist;with contact guard assist;sitting/lateral leans;sit to/from stand Pt Will Transfer to Toilet: with supervision;ambulating;regular height toilet Pt Will Perform Toileting - Clothing Manipulation and hygiene: with supervision;sitting/lateral leans;sit to/from stand   OT Frequency:  Min 2X/week    Co-evaluation              AM-PAC OT 6 Clicks Daily Activity     Outcome Measure Help from another person eating meals?: None Help from another person taking care of personal grooming?: A Little Help from another person toileting, which includes using toliet, bedpan, or urinal?: A Little Help from another person bathing (including washing, rinsing, drying)?: A Lot Help from another  person to put on and taking off regular upper body clothing?: A Little Help from another person to put on and taking off regular lower body clothing?: A Lot 6 Click Score: 17   End of Session Nurse Communication: Mobility status  Activity Tolerance: Patient tolerated treatment well Patient left: in chair;with call bell/phone within reach;with chair alarm set;with family/visitor present  OT Visit Diagnosis: Other abnormalities of gait and mobility (R26.89);Unsteadiness on feet (R26.81);Muscle weakness (generalized) (M62.81)                Time: 4540-9811 OT Time Calculation (min): 38 min Charges:  OT General Charges $OT Visit: 1 Visit OT Evaluation $OT Eval Moderate Complexity: 1 Mod OT Treatments $Self Care/Home Management : 23-37 mins  Tyteanna Ost, OTR/L 05/12/24, 1:01 PM  Brett Darko E Atlas Kuc 05/12/2024, 12:57 PM

## 2024-05-12 NOTE — Evaluation (Signed)
 Physical Therapy Evaluation Patient Details Name: Julie Mcbride MRN: [id number] DOB: 1936/11/17 Today's Date: 05/12/2024  History of Present Illness  Pt is a 88 y.o female who presents to ED on 05/10/24 with concerns of worsening low back pain. PMH: arthritis, hypertension, ischemic cardiomyopathy,DJD.  Clinical Impression  Prior to recent medical concerns, pt reports being mod independent with ADLs; lives in a 1 level home with 3-4 STE (uses garage to enter), and ambulates at home/ short community distances with a SPC.  Currently pt is able to perform STS to RW with CGA, and ambulates 190 ft with RW and CGA in two bouts requiring short standing break to catch breath (reports of lightheadedness with ambulation requirering a short standing break, SpO2: 88% on room air but recovered quickly to 93+% on room air), vc's provided for pursed lip breathing which subside her symptoms. Pt currently has strength limitations, decreased tolerance to exercise, decreased endurance, increased spasms, and point tenderness in lower back. Pt would currently benefit from skilled PT to address noted impairments and functional limitations (see below for any additional details).  Upon hospital discharge, pt would benefit from ongoing therapy to return towards PLOF.         If plan is discharge home, recommend the following: A little help with walking and/or transfers;A little help with bathing/dressing/bathroom;Assistance with cooking/housework;Assist for transportation;Help with stairs or ramp for entrance   Can travel by private vehicle        Equipment Recommendations None recommended by PT  Recommendations for Other Services       Functional Status Assessment Patient has had a recent decline in their functional status and demonstrates the ability to make significant improvements in function in a reasonable and predictable amount of time.     Precautions / Restrictions Precautions Precautions: Fall Recall  of Precautions/Restrictions: Intact Restrictions Weight Bearing Restrictions Per Provider Order: No      Mobility  Bed Mobility               General bed mobility comments: Able to scoot to edge of recliner with BUE support    Transfers Overall transfer level: Needs assistance Equipment used: Rolling walker (2 wheels) Transfers: Sit to/from Stand Sit to Stand: Contact guard assist           General transfer comment: Able to recall all safe STS sequencing, and able to perform in an adequate amount of time    Ambulation/Gait Ambulation/Gait assistance:Contact guard assist Gait Distance (Feet): 190 Feet Assistive device: Rolling walker (2 wheels) Gait Pattern/deviations: Decreased step length - right, Decreased step length - left, Step-to pattern, Bilateral lateral sway, Wide base of support Gait velocity: decreased     General Gait Details: Reports of lightheadedness with ambulation requirering a short standing break, vc's for pursed lip breathing. SpO2: 88% on room air but recovered quickly to 93+% on room air with aformentioned breathing technique.  Stairs            Wheelchair Mobility     Tilt Bed    Modified Rankin (Stroke Patients Only)       Balance Overall balance assessment: Needs assistance Sitting-balance support: Feet supported, Single extremity supported Sitting balance-Leahy Scale: Good Sitting balance - Comments: Steady static seated balance edge of recliner   Standing balance support: During functional activity, Single extremity supported, No upper extremity supported Standing balance-Leahy Scale: Good Standing balance comment: Steady static standing balance within BOS, was able to stand with no UE support on RW when gesticulating  to express herself.                             Pertinent Vitals/Pain Pain Assessment Pain Assessment: 0-10 Pain Score: 5  (Pain at baseline (5/10), resolves with getting up (0/10), (4/10) at  the end of the session seated in recliner.) Pain Location: R lower back pain Pain Descriptors / Indicators: Constant, Shooting, Sharp, Spasm Pain Intervention(s): Limited activity within patient's tolerance, Monitored during session, Repositioned, Other (comment) (Exercised), pt premedicated with pain medications     Home Living Family/patient expects to be discharged to:: Private residence Living Arrangements: Spouse/significant other Available Help at Discharge: Family;Available 24 hours/day Type of Home: House Home Access: Stairs to enter Entrance Stairs-Rails:  (L rail for back entrance, R rail for front) Entrance Stairs-Number of Steps: 3 in the back; front has 4 STE and a door threshold, but uses garage to walk in.   Home Layout: One level Home Equipment: Shower seat - built in;Cane - single point;Hand held Armed forces logistics/support/administrative officer (2 wheels);Grab bars - tub/shower;Rollator (4 wheels) Additional Comments: SPC for household ambulation, stated increased fear with recent falls (feels much safer with RW)    Prior Function Prior Level of Function : Independent/Modified Independent;History of Falls (last six months)             Mobility Comments: Walks with SPC in the home and to church; 1 fall at church <2 months ago when legs gave out; reports able to walk through grocery store pushing cart ADLs Comments: IND with ADLs, team effort with husband for IADLS; typically stands for shower     Extremity/Trunk Assessment   Upper Extremity Assessment Upper Extremity Assessment: Defer to OT evaluation    Lower Extremity Assessment Lower Extremity Assessment: LLE deficits/detail;RLE deficits/detail;Generalized weakness RLE Deficits / Details: Hip flexion: 5/5, Knee flexion 4-/5, Knee extension 5/5, DF 5/5 LLE Deficits / Details: Hip flexion: 5/5, Knee flexion 4/5, Knee extension 4+/5, DF 5/5    Cervical / Trunk Assessment Cervical / Trunk Assessment: Neck Surgery (Reports  of cervical fusions at C4-C6 level and plates)  Communication   Communication Communication: No apparent difficulties    Cognition Arousal: Alert Behavior During Therapy: WFL for tasks assessed/performed   PT - Cognitive impairments: No apparent impairments                       PT - Cognition Comments: Very pleasant Following commands: Intact       Cueing Cueing Techniques: Verbal cues     General Comments General comments (skin integrity, edema, etc.): Pre session: HR: 61 bpm, SpO2: 92+% on room air, BP: (148/60), With ambulation: HR: 66 bpm, SpO2: 88-94+ % on room air, Post session: HR: 64 bpm, SpO2: 93+% on room air. Point tender @ level of T11-T12 with increased spasm following treatment    Exercises     Assessment/Plan    PT Assessment Patient needs continued PT services  PT Problem List Decreased strength;Decreased balance;Decreased mobility;Decreased activity tolerance       PT Treatment Interventions Functional mobility training;Balance training;Patient/family education;Therapeutic activities;Therapeutic exercise    PT Goals (Current goals can be found in the Care Plan section)  Acute Rehab PT Goals Patient Stated Goal: Go back home, to stop having indigestion PT Goal Formulation: With patient Time For Goal Achievement: 05/26/24    Frequency Min 3X/week     Co-evaluation  AM-PAC PT 6 Clicks Mobility  Outcome Measure Help needed turning from your back to your side while in a flat bed without using bedrails?: A Little Help needed moving from lying on your back to sitting on the side of a flat bed without using bedrails?: A Little Help needed moving to and from a bed to a chair (including a wheelchair)?: A Little Help needed standing up from a chair using your arms (e.g., wheelchair or bedside chair)?: A Little Help needed to walk in hospital room?: A Little Help needed climbing 3-5 steps with a railing? : A Little 6 Click  Score: 18    End of Session Equipment Utilized During Treatment: Gait belt Activity Tolerance: Patient tolerated treatment well Patient left: in chair;with chair alarm set;with call bell/phone within reach;with family/visitor present Nurse Communication: Mobility status PT Visit Diagnosis: Unsteadiness on feet (R26.81);History of falling (Z91.81);Muscle weakness (generalized) (M62.81);Dizziness and giddiness (R42)    Time: 0981-1914 PT Time Calculation (min) (ACUTE ONLY): 26 min   Charges:            Tashi Band, SPT 05/12/24, 4:49 PM

## 2024-05-12 NOTE — Progress Notes (Signed)
 Order received for pure wick

## 2024-05-12 NOTE — Plan of Care (Signed)
  Problem: Education: Goal: Knowledge of General Education information will improve Description: Including pain rating scale, medication(s)/side effects and non-pharmacologic comfort measures 05/12/2024 0427 by Florian Hurt, RN Outcome: Progressing 05/12/2024 0427 by Florian Hurt, RN Outcome: Progressing   Problem: Health Behavior/Discharge Planning: Goal: Ability to manage health-related needs will improve 05/12/2024 0427 by Florian Hurt, RN Outcome: Progressing 05/12/2024 0427 by Florian Hurt, RN Outcome: Progressing   Problem: Clinical Measurements: Goal: Ability to maintain clinical measurements within normal limits will improve 05/12/2024 0427 by Florian Hurt, RN Outcome: Progressing 05/12/2024 0427 by Florian Hurt, RN Outcome: Progressing Goal: Will remain free from infection 05/12/2024 0427 by Florian Hurt, RN Outcome: Progressing 05/12/2024 0427 by Florian Hurt, RN Outcome: Progressing Goal: Diagnostic test results will improve 05/12/2024 0427 by Florian Hurt, RN Outcome: Progressing 05/12/2024 0427 by Florian Hurt, RN Outcome: Progressing Goal: Respiratory complications will improve 05/12/2024 0427 by Florian Hurt, RN Outcome: Progressing 05/12/2024 0427 by Florian Hurt, RN Outcome: Progressing Goal: Cardiovascular complication will be avoided 05/12/2024 0427 by Florian Hurt, RN Outcome: Progressing 05/12/2024 0427 by Florian Hurt, RN Outcome: Progressing   Problem: Activity: Goal: Risk for activity intolerance will decrease 05/12/2024 0427 by Florian Hurt, RN Outcome: Progressing 05/12/2024 0427 by Florian Hurt, RN Outcome: Progressing   Problem: Nutrition: Goal: Adequate nutrition will be maintained 05/12/2024 0427 by Florian Hurt, RN Outcome: Progressing 05/12/2024 0427 by Florian Hurt, RN Outcome: Progressing   Problem: Coping: Goal: Level of anxiety will decrease 05/12/2024 0427 by Florian Hurt, RN Outcome:  Progressing 05/12/2024 0427 by Florian Hurt, RN Outcome: Progressing   Problem: Elimination: Goal: Will not experience complications related to bowel motility 05/12/2024 0427 by Florian Hurt, RN Outcome: Progressing 05/12/2024 0427 by Florian Hurt, RN Outcome: Progressing Goal: Will not experience complications related to urinary retention 05/12/2024 0427 by Florian Hurt, RN Outcome: Progressing 05/12/2024 0427 by Florian Hurt, RN Outcome: Progressing   Problem: Pain Managment: Goal: General experience of comfort will improve and/or be controlled 05/12/2024 0427 by Florian Hurt, RN Outcome: Progressing 05/12/2024 0427 by Florian Hurt, RN Outcome: Progressing   Problem: Safety: Goal: Ability to remain free from injury will improve 05/12/2024 0427 by Florian Hurt, RN Outcome: Progressing 05/12/2024 0427 by Florian Hurt, RN Outcome: Progressing   Problem: Skin Integrity: Goal: Risk for impaired skin integrity will decrease 05/12/2024 0427 by Florian Hurt, RN Outcome: Progressing 05/12/2024 0427 by Florian Hurt, RN Outcome: Progressing

## 2024-05-13 ENCOUNTER — Other Ambulatory Visit: Payer: Self-pay | Admitting: Oncology

## 2024-05-13 DIAGNOSIS — E66812 Obesity, class 2: Secondary | ICD-10-CM | POA: Insufficient documentation

## 2024-05-13 DIAGNOSIS — R52 Pain, unspecified: Secondary | ICD-10-CM | POA: Diagnosis not present

## 2024-05-13 DIAGNOSIS — C3490 Malignant neoplasm of unspecified part of unspecified bronchus or lung: Secondary | ICD-10-CM | POA: Diagnosis not present

## 2024-05-13 DIAGNOSIS — I10 Essential (primary) hypertension: Secondary | ICD-10-CM | POA: Diagnosis not present

## 2024-05-13 LAB — CEA: CEA: 29.1 ng/mL — ABNORMAL HIGH (ref 0.0–4.7)

## 2024-05-13 LAB — CA 125: Cancer Antigen (CA) 125: 105 U/mL — ABNORMAL HIGH (ref 0.0–38.1)

## 2024-05-13 MED ORDER — LOSARTAN POTASSIUM 50 MG PO TABS
100.0000 mg | ORAL_TABLET | Freq: Every day | ORAL | Status: DC
  Administered 2024-05-13 – 2024-05-14 (×2): 100 mg via ORAL
  Filled 2024-05-13 (×2): qty 2

## 2024-05-13 NOTE — Progress Notes (Signed)
 [facility]  [phone] [phone]  ID: Horacio Lyell OB: Oct 22, 1936  MR#: [id number]  QIO#:[id number]  Patient Care Team: Monique Ano, MD as PCP - General (Family Medicine)  CHIEF COMPLAINT: Bilateral lung lesions, suspicion of malignancy.  INTERVAL HISTORY: Patient's pain is significantly better controlled and she feels nearly back to her baseline.  She offers no further complaints today.  Daughter and husband at bedside.  REVIEW OF SYSTEMS:   Review of Systems  Constitutional: Negative.  Negative for fever, malaise/fatigue and weight loss.  Respiratory: Negative.  Negative for cough, hemoptysis and shortness of breath.   Cardiovascular: Negative.  Negative for chest pain and leg swelling.  Gastrointestinal: Negative.  Negative for abdominal pain.  Genitourinary: Negative.  Negative for dysuria.  Musculoskeletal:  Positive for back pain.  Skin: Negative.  Negative for rash.  Neurological: Negative.  Negative for dizziness, focal weakness, weakness and headaches.  Psychiatric/Behavioral: Negative.  The patient is not nervous/anxious.     As per HPI. Otherwise, a complete review of systems is negative.  PAST MEDICAL HISTORY: Past Medical History:  Diagnosis Date   Arthritis    Knees, hands   Basal cell carcinoma    back - removed   Bladder polyp    With previos hematuria   Diverticulitis    Dyspnea on exertion    Chronic   GERD (gastroesophageal reflux disease)    History of ETT    Myoview 7/10 EF 78%, no ischemia or infarction. Poor exercise tolerance (2'30, stopped due to fatigue). Reached 86% MPHR.   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 Aggie Horton   BACK SURGERY     BASAL CELL CARCINOMA EXCISION     back   BREAST BIOPSY Right 06/19/2017   Affirm Bx- Radial Scar   BREAST EXCISIONAL BIOPSY Right 1996   neg   BREAST LUMPECTOMY WITH NEEDLE LOCALIZATION Right 08/11/2017   Procedure: BREAST LUMPECTOMY WITH NEEDLE LOCALIZATION;  Surgeon: Benancio Bracket, MD;  Location: [facility];  Service: General;  Laterality: Right;   CARPAL TUNNEL RELEASE Right    CATARACT EXTRACTION W/ INTRAOCULAR LENS  IMPLANT, BILATERAL     EXCISION OF BREAST BIOPSY Right 08/11/2017   excision of radial Scar   EYE SURGERY     JOINT REPLACEMENT Right    knee   KNEE ARTHROSCOPY Right 04/22/2016   Procedure: ARTHROSCOPY KNEE;  Surgeon: Josephus Nida, MD;  Location: [facility];  Service: Orthopedics;  Laterality: Right;   knee replacement Right 01/11/2017   NASAL SINUS SURGERY  06/05/14   Dr. Donnie Galea   POSTERIOR CERVICAL LAMINECTOMY  2005   C5-7; plate and screws   RENAL ANGIOGRAPHY Left 04/20/2020   Procedure: RENAL ANGIOGRAPHY;  Surgeon: Celso College, MD;  Location: [facility];  Service: Cardiovascular;  Laterality: Left;   TONSILLECTOMY      FAMILY HISTORY: Family History  Problem Relation Age of Onset   Ovarian cancer Mother    Stroke Father        CVA  Breast cancer Daughter 24   Breast cancer Maternal Aunt 60   Breast cancer Maternal Aunt 90   Prostate cancer Neg Hx    Bladder Cancer Neg Hx    Kidney cancer Neg Hx     ADVANCED DIRECTIVES (Y/N):  @ADVDIR @  HEALTH MAINTENANCE: Social History   Tobacco Use   Smoking status: Never   Smokeless tobacco: Never  Vaping Use   Vaping status: Never Used  Substance Use Topics   Alcohol use: No   Drug use: No     Colonoscopy:  PAP:  Bone density:  Lipid panel:  Allergies  Allergen Reactions   Chlorhexidine Hives    SAGE wipes caused severe rash   Cyclobenzaprine Hcl      Doesn't remember reaction   Etodolac     Doesn't remember reaction   Lisinopril     Coughing up blood   Meloxicam     Doesn't remember reaction   Metaxalone     Doesn't remember reaction   Neomycin-Bacitracin Zn-Polymyx     Skin irritation   Nifedipine Swelling   Nitrofurantoin     Doesn't remember reaction   Sulfamethoxazole-Trimethoprim Hives   Sulfonamide Derivatives Hives   Tizanidine     Liver problems   Vasotec [Enalapril Maleate]     Doesn't remember reaction   Ibuprofen Rash   Tape Rash    Current Facility-Administered Medications  Medication Dose Route Frequency Provider Last Rate Last Admin   acetaminophen  (TYLENOL ) tablet 650 mg  650 mg Oral Q6H PRN Cox, Amy N, DO       Or   acetaminophen  (TYLENOL ) suppository 650 mg  650 mg Rectal Q6H PRN Cox, Amy N, DO       atorvastatin  (LIPITOR) tablet 10 mg  10 mg Oral Daily Zhang, Dekui, MD   10 mg at 05/13/24 0957   bisoprolol  (ZEBETA ) tablet 10 mg  10 mg Oral Daily Zhang, Dekui, MD   10 mg at 05/13/24 0956   calcium  carbonate (TUMS - dosed in mg elemental calcium ) chewable tablet 200 mg of elemental calcium   1 tablet Oral TID PRN Elisabeth Guild, NP   200 mg of elemental calcium  at 05/12/24 1035   clopidogrel  (PLAVIX ) tablet 75 mg  75 mg Oral Daily Zhang, Dekui, MD   75 mg at 05/13/24 0957   donepezil (ARICEPT) tablet 5 mg  5 mg Oral QHS Cox, Amy N, DO   5 mg at 05/12/24 2053   feeding supplement (ENSURE PLUS HIGH PROTEIN) liquid 237 mL  237 mL Oral BID BM Zhang, Dekui, MD   237 mL at 05/13/24 1250   fluticasone (FLONASE) 50 MCG/ACT nasal spray 2 spray  2 spray Each Nare PRN Cox, Amy N, DO       heparin  injection 5,000 Units  5,000 Units Subcutaneous Q8H Cox, Amy N, DO   5,000 Units at 05/13/24 1250   hydrALAZINE  (APRESOLINE ) injection 5 mg  5 mg Intravenous Q6H PRN Cox, Amy N, DO       hydrALAZINE  (APRESOLINE ) tablet 50 mg  50 mg Oral TID Zhang, Dekui, MD   50 mg at 05/13/24 0956   HYDROmorphone  (DILAUDID ) injection 0.5 mg   0.5 mg Intravenous Q4H PRN Donaciano Frizzle, MD   0.5 mg at 05/12/24 1613   lidocaine  (LIDODERM ) 5 % 1 patch  1 patch Transdermal Q24H Donaciano Frizzle, MD   1 patch at 05/13/24 0956   losartan  (COZAAR ) tablet 100 mg  100 mg Oral Daily Donaciano Frizzle, MD  100 mg at 05/13/24 1249   methocarbamol (ROBAXIN) tablet 500 mg  500 mg Oral Q6H PRN Donaciano Frizzle, MD   500 mg at 05/13/24 1610   oxyCODONE -acetaminophen  (PERCOCET/ROXICET) 5-325 MG per tablet 1 tablet  1 tablet Oral Q4H PRN Zhang, Dekui, MD   1 tablet at 05/13/24 1102   predniSONE  (DELTASONE ) tablet 20 mg  20 mg Oral Q breakfast Donaciano Frizzle, MD   20 mg at 05/13/24 0957   rOPINIRole  (REQUIP ) tablet 0.5 mg  0.5 mg Oral QHS Cox, Amy N, DO   0.5 mg at 05/12/24 2053   senna-docusate (Senokot-S) tablet 2 tablet  2 tablet Oral BID Donaciano Frizzle, MD   2 tablet at 05/13/24 0956    OBJECTIVE: Vitals:   05/13/24 0510 05/13/24 0802  BP: (!) 156/57 (!) 159/65  Pulse: 66 66  Resp:  19  Temp: 97.9 F (36.6 C) 98.2 F (36.8 C)  SpO2: 93% 95%     Body mass index is 35.12 kg/m.    ECOG FS:1 - Symptomatic but completely ambulatory  General: Well-developed, well-nourished, no acute distress. Eyes: Pink conjunctiva, anicteric sclera. HEENT: Normocephalic, moist mucous membranes. Lungs: No audible wheezing or coughing. Heart: Regular rate and rhythm. Abdomen: Soft, nontender, no obvious distention. Musculoskeletal: No edema, cyanosis, or clubbing. Neuro: Alert, answering all questions appropriately. Cranial nerves grossly intact. Skin: No rashes or petechiae noted. Psych: Normal affect.  LAB RESULTS:  Lab Results  Component Value Date   NA 137 05/11/2024   K 3.7 05/11/2024   CL 104 05/11/2024   CO2 22 05/11/2024   GLUCOSE 132 (H) 05/11/2024   BUN 25 (H) 05/11/2024   CREATININE 1.20 (H) 05/11/2024   CALCIUM  8.7 (L) 05/11/2024   PROT 6.8 05/10/2024   ALBUMIN 3.8 05/10/2024   AST 28 05/10/2024   ALT 22 05/10/2024   ALKPHOS 77 05/10/2024   BILITOT  1.0 05/10/2024   GFRNONAA 44 (L) 05/11/2024   GFRAA 60 (L) 04/20/2020    Lab Results  Component Value Date   WBC 8.6 05/11/2024   NEUTROABS 7.7 05/10/2024   HGB 11.2 (L) 05/11/2024   HCT 34.7 (L) 05/11/2024   MCV 92.3 05/11/2024   PLT 116 (L) 05/11/2024     STUDIES: CT Angio Chest/Abd/Pel for Dissection W and/or Wo Contrast Result Date: 05/10/2024 CLINICAL DATA:  Acute right back and shoulder pain at 2 a.m. today. Clinical concern for aortic dissection. EXAM: CT ANGIOGRAPHY CHEST, ABDOMEN AND PELVIS TECHNIQUE: Multidetector CT imaging through the chest, abdomen and pelvis was performed using the standard protocol during bolus administration of intravenous contrast. Multiplanar reconstructed images and MIPs were obtained and reviewed to evaluate the vascular anatomy. RADIATION DOSE REDUCTION: This exam was performed according to the departmental dose-optimization program which includes automated exposure control, adjustment of the mA and/or kV according to patient size and/or use of iterative reconstruction technique. CONTRAST:  75mL OMNIPAQUE IOHEXOL 350 MG/ML SOLN COMPARISON:  Chest CT dated 09/09/2013. Chest radiographs dated 03/09/2023 and 12/25/2020 and chest CTA dated 09/08/2013. FINDINGS: CTA CHEST FINDINGS Cardiovascular: Mild atheromatous calcifications including the thoracic aorta. No aortic aneurysm or dissection. Normally opacified pulmonary arteries with no pulmonary arterial filling defects. Mildly enlarged heart with left atrial and left ventricular dilatation. No pericardial effusion. Mediastinum/Nodes: Moderate to large-sized hiatal hernia is again demonstrated. Unremarkable thyroid gland and trachea. Mildly enlarged precarinal node with a short axis diameter of 10 mm on image number 55/6, previously 8 mm. Mildly enlarged right hilar nodes, the largest with a short axis  diameter of 11 mm on image number 60/6. Mildly enlarged subcarinal node with a short axis diameter of 10 mm on  image number 63/6. Lungs/Pleura: Multiple interval bilateral lung nodules involving all lobes of both lungs. The largest on the right measures 1.6 x 1.3 cm on image number 79/7 in the largest on the left is in the left lower lobe, measuring 1.7 x 1.1 cm on image number 86/7. One nodule is more spiculated than the others. This is in the right lower lobe, measuring 1.7 x 1.1 cm on image number 93/7. Interval small right pleural effusion. The previously demonstrated ground-glass opacity on the right has resolved. Musculoskeletal: Thoracic spine degenerative changes and cervical spine fixation hardware. No evidence of bony metastatic disease. Review of the MIP images confirms the above findings. CTA ABDOMEN AND PELVIS FINDINGS VASCULAR Aorta: Mild atheromatous calcifications.  No aneurysm or dissection. Celiac: Patent without evidence of aneurysm, dissection, vasculitis or significant stenosis. SMA: Patent without evidence of aneurysm, dissection, vasculitis or significant stenosis. Renals: Proximal left renal artery stent. This is patent. Normal-appearing right renal artery. IMA: Patent without evidence of aneurysm, dissection, vasculitis or significant stenosis. Inflow: Patent without evidence of aneurysm, dissection, vasculitis or significant stenosis. Veins: No obvious venous abnormality within the limitations of this arterial phase study. Review of the MIP images confirms the above findings. NON-VASCULAR Hepatobiliary: No significant change in previously demonstrated liver cysts. No evidence of liver metastatic disease. Normal-appearing gallbladder. Pancreas: Unremarkable. No pancreatic ductal dilatation or surrounding inflammatory changes. Spleen: Normal in size without focal abnormality. Adrenals/Urinary Tract: 1.4 x 1.0 cm low to medium density left adrenal nodule on image number 58/5 is smaller than on 09/09/2013, measuring 1.8 x 1.3 cm at that time. This currently measures 20 Hounsfield units in density and  previously measured 18 Hounsfield units in density. Normal-appearing right adrenal gland. 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 This does not need imaging follow-up. Two adjacent 4 mm calculi in the mid right kidney. Normal-appearing ureters. Small left bladder diverticulum. No hydronephrosis. Stomach/Bowel: Multiple diverticula throughout the colon, most numerous in the sigmoid colon. No evidence of diverticulitis. Stable moderately large hiatal hernia. Unremarkable appendix. Lymphatic: No enlarged lymph nodes. Reproductive: Status post hysterectomy. No adnexal masses. Other: Moderate-sized bilateral inguinal hernias containing fat. Musculoskeletal: Mild lumbar spine degenerative changes. No evidence of bony metastatic disease. Review of the MIP images confirms the above findings. IMPRESSION: 1. No aortic aneurysm or dissection. 2. Multiple interval bilateral lung nodules, compatible with metastatic disease with a possible right lower lobe primary. A multinodular infectious process is less likely. 3. Mild mediastinal and right hilar lymphadenopathy, compatible with metastatic disease. Reactive adenopathy is less likely. 4. Interval small right pleural effusion. 5. Mild cardiomegaly with left atrial and left ventricular dilatation. 6. Moderate to large-sized hiatal hernia. 7. Colonic diverticulosis. 8. Nonobstructing right renal calculi. 9. Moderate-sized bilateral inguinal hernias containing fat. 10. 1.4 x 1.0 cm low to medium density left adrenal nodule, smaller than on 09/09/2013, compatible with a benign adenoma not needing imaging follow-up. 11. Aortic atherosclerosis. Aortic Atherosclerosis (ICD10-I70.0). Electronically Signed   By: Catherin Closs M.D.   On: 05/10/2024 13:30   CT Thoracic Spine Wo Contrast Result Date: 05/10/2024 CLINICAL DATA:  Acute right back to shoulder pain. EXAM: CT THORACIC SPINE WITHOUT CONTRAST TECHNIQUE: Multidetector CT images of the  thoracic were obtained using the standard protocol without intravenous contrast. RADIATION DOSE REDUCTION: This exam was performed according to the departmental dose-optimization program  which includes automated exposure control, adjustment of the mA and/or kV according to patient size and/or use of iterative reconstruction technique. COMPARISON:  None Available. FINDINGS: Alignment: Mild-to-moderate kyphosis. Vertebrae: Vertebrae are intact and appear normally mineralized. No lesions are evident. Patient is status post interbody fusion and anterior spinal fixation at C5-6 and C6-7. Paraspinal and other soft tissues: The immediate paraspinous soft tissues are unremarkable. There are pulmonary nodules present and bilateral pleural effusions, worse on the right. There is also moderate hiatus hernia. Disc levels: There is mild disc space narrowing present throughout the thoracic spine. There is no apparent disc herniation or significant spinal canal or neural foraminal stenosis. There is no apparent acute process. IMPRESSION: 1. Mild diffuse degenerative disc disease throughout the thoracic spine without significant spinal canal or neural foraminal stenosis. 2. Numerous pulmonary nodules and bilateral pleural effusions, which will be more definitively assessed in the accompanying CT of the chest. Electronically Signed   By: Maribeth Shivers M.D.   On: 05/10/2024 12:34   CT Lumbar Spine Wo Contrast Result Date: 05/10/2024 CLINICAL DATA:  Acute lower back pain. EXAM: CT LUMBAR SPINE WITHOUT CONTRAST TECHNIQUE: Multidetector CT imaging of the lumbar spine was performed without intravenous contrast administration. Multiplanar CT image reconstructions were also generated. RADIATION DOSE REDUCTION: This exam was performed according to the departmental dose-optimization program which includes automated exposure control, adjustment of the mA and/or kV according to patient size and/or use of iterative reconstruction  technique. COMPARISON:  None Available. FINDINGS: Segmentation: The L1 vertebral body has hypoplastic ribs. Alignment: Slight anterolisthesis at L5-S1. Mild dextrocurvature of the thoracolumbar junction. Vertebrae: Vertebral bodies are intact and maintain their height. No lesions are evident. Paraspinal and other soft tissues: There is no paravertebral soft tissue swelling or hematoma evident. There are nonobstructive calculi present within the right kidney. There is also moderate right renal cortical atrophy and scarring. A fat density lesion is present in the superior pole of the left kidney. There is a left renal artery stent. There are numerous sigmoid diverticula present. Disc levels: L1-2: Minimal disc bulging and mild facet hypertrophy. No significant spinal canal or neural foraminal stenosis. L2-3: Mild disc bulging and mild facet arthrosis. No significant spinal canal or neural foraminal stenosis. L3-4: Mild diffuse disc bulging and mild to moderate bilateral facet arthrosis, with mild-to-moderate central spinal canal stenosis and bilateral lateral recess stenosis. L4-5: Mild disc bulging and mild bilateral facet arthrosis. Mild right lateral recess and neural foraminal stenosis. L5-S1: Slight degenerative anterolisthesis and severe bilateral facet arthrosis. No significant spinal canal or neural foraminal stenosis. IMPRESSION: 1. Mild diffuse degenerative disc disease and multilevel facet arthrosis, which is most severe at L5-S1. 2. Right nephrolithiasis. 3. Colonic diverticulosis. Electronically Signed   By: Maribeth Shivers M.D.   On: 05/10/2024 12:27    ASSESSMENT: Bilateral lung lesions, suspicion of malignancy.  PLAN:    Bilateral lung lesions, suspicion of malignancy: CT scan of the chest, abdomen, pelvis completed on May 10, 2024 reviewed independently and reported as above revealed multiple bilateral lung nodules the largest on the right lung measuring 1.6 x 1.3 cm.  The largest in left  side measures 1.7 x 1.1.  Patient has mild mediastinal lymphadenopathy which may be inflammatory, but no other evidence of disease.  She has not had a mammogram since 2019.  Of note, patient had a breast biopsy on June 19, 2017 that did not reveal malignancy, but pathology consistent with a radial scar.  Radial scars have been known to  increase the risk of breast cancer, although CA 27-29 is within normal limits.  All of her other tumor markers are pending at time of dictation.  Case discussed with pulmonology who plans to do EBUS/bronchoscopy as an outpatient after discharge.  Will also plan for PET scan as an outpatient.  Will arrange follow-up in the cancer center 1 week after biopsy.  Back pain: Significantly improved.  Does not appear to be related to underlying malignancy.  Continue symptomatic treatment. Renal insufficiency: Patient's most recent creatinine was improved to 1.20.  Monitor. Anemia: Mild, monitor.  Patient's most recent hemoglobin is 11.2. Thrombocytopenia: Mild, monitor.  Patient's most recent platelet count was 116. Disposition: Okay to discharge from an oncology standpoint with follow-up as above.    Shellie Dials, MD   05/13/2024 2:15 PM

## 2024-05-13 NOTE — Care Management Important Message (Signed)
 Important Message  Patient Details  Name: Julie Mcbride MRN: [id number] Date of Birth: 08/30/1936   Important Message Given:  Yes - Medicare IM     Anise Kerns 05/13/2024, 12:01 PM

## 2024-05-13 NOTE — TOC Initial Note (Signed)
 Transition of Care [facility]) - Initial/Assessment Note    Patient Details  Name: Julie Mcbride MRN: [id number] Date of Birth: January 20, 1936  [facility] CM/SW Contact:    Loman Risk, RN Phone Number: 05/13/2024, 12:15 PM  Clinical Narrative:                 Admitted for: Back pain Admitted from: home with husband PCP: [place]  Current home health/prior home health/DME: [facility], [facility]  PT recommending HH.  OT recommending SNF.  Discussed with patient, patient in agreement for home health, does not have a preference, but would like TOC to check with who she had last time.  Per ping last have [facility] in 2018.   Referral made to [place]  with [facility]          Patient Goals and CMS Choice            Expected Discharge Plan and Services                                              Prior Living Arrangements/Services                       Activities of Daily Living   ADL Screening (condition at time of admission) Independently performs ADLs?: Yes (appropriate for developmental age) Is the patient deaf or have difficulty hearing?: No Does the patient have difficulty seeing, even when wearing glasses/contacts?: No Does the patient have difficulty concentrating, remembering, or making decisions?: No  Permission Sought/Granted                  Emotional Assessment              Admission diagnosis:  Pleural effusion [J90] Lung nodule [R91.1] Inadequate pain control [R52] Acute bilateral thoracic back pain [M54.6] Patient Active Problem List   Diagnosis Date Noted   Class 2 obesity 05/13/2024   Thrombocytopenia (HCC) 05/11/2024   Inadequate pain control 05/10/2024   Lung cancer (HCC) 05/10/2024   Epistaxis due to trauma 03/09/2023   Laceration of right hand 03/09/2023   Accidental fall 03/09/2023   Facial contusion, initial encounter 03/09/2023   Closed fracture of nasal bones 03/09/2023   Acute pain of right  knee, secondary to fall 03/09/2023   Periorbital contusion of right eye 03/09/2023   History of recurrent UTIs 02/16/2021   History of non-ST elevation myocardial infarction (NSTEMI) 01/01/2021   History of renal artery stenosis 01/01/2021   Ischemic cardiomyopathy 01/01/2021   Primary osteoarthritis of both hands 01/01/2021   Acute on chronic systolic CHF (congestive heart failure) (HCC) 12/28/2020   COVID-19 12/25/2020   Sepsis (HCC) 12/25/2020   Community acquired pneumonia 12/25/2020   Vitamin D  deficiency 04/03/2020   Renovascular hypertension 04/03/2020   Renal artery stenosis (HCC) 04/03/2020   CKD stage 3a, GFR 45-59 ml/min (HCC) 03/18/2020   SHORTNESS OF BREATH 06/17/2009   Essential hypertension 05/27/2009   PALPITATIONS 05/27/2009   ABNORMAL EKG 05/27/2009   PCP:  Monique Ano, MD Pharmacy:   [facility] - [place], [place] - [address] [place] [address] Phone: [phone] Fax: [phone]     Social Drivers of Health (SDOH) Social History: SDOH Screenings   Food Insecurity: No Food Insecurity (05/10/2024)  Housing: Low Risk  (05/10/2024)  Transportation Needs: No  Transportation Needs (05/10/2024)  Utilities: Not At Risk (05/10/2024)  Financial Resource Strain: Low Risk  (08/22/2023)   Received from [facility]  Social Connections: Socially Integrated (05/10/2024)  Tobacco Use: Low Risk  (05/10/2024)   SDOH Interventions:     Readmission Risk Interventions     No data to display

## 2024-05-13 NOTE — Progress Notes (Signed)
 Physical Therapy Treatment Patient Details Name: Julie Mcbride MRN: [id number] DOB: 1936-06-06 Today's Date: 05/13/2024   History of Present Illness Pt is a 88 y.o female who presents to ED on 05/10/24 with concerns of worsening low back pain. PMH: arthritis, hypertension, ischemic cardiomyopathy,DJD.    PT Comments  Pt supine in bed with HOB elevated upon PT arrival, with spouse present in the room.  Pt is very pleasant and agreeable to PT. Pt was able to perform bed mobility with SBA (for increased safety), performs 1x STS to RW with SBA/CGA, and ambulates 208 ft with RW and CGA with multiple turns without overt LOB or unsteadiness. Pt gait pattern and speed have improved from yesterday's session, indicating progress towards functional goals. Pt did not report nor demonstrates symptoms of lightheadedness throughout session indicating another improvement from yesterday's mobility status. Pt would currently benefit from skilled PT to continue to work towards PLOF.    If plan is discharge home, recommend the following: A little help with walking and/or transfers;A little help with bathing/dressing/bathroom;Assistance with cooking/housework;Assist for transportation;Help with stairs or ramp for entrance   Can travel by private vehicle      Yes  Equipment Recommendations  None recommended by PT    Recommendations for Other Services       Precautions / Restrictions Precautions Precautions: Fall Recall of Precautions/Restrictions: Intact Restrictions Weight Bearing Restrictions Per Provider Order: No     Mobility  Bed Mobility Overal bed mobility: Needs Assistance Bed Mobility: Supine to Sit     Supine to sit: Supervision          Transfers Overall transfer level: Needs assistance Equipment used: Rolling walker (2 wheels) Transfers: Sit to/from Stand Sit to Stand: Supervision, Contact guard assist (increased safety)           General transfer comment: Pt able to  recall, safe UE positioning, performed a STS from lowest bed height to RW.    Ambulation/Gait Ambulation/Gait assistance: Contact guard assist Gait Distance (Feet): 208 Feet Assistive device: Rolling walker (2 wheels) Gait Pattern/deviations: Wide base of support, Step-through pattern, Increased bilateral lateral sway  Gait velocity: Increased gait speed compared to yesterday's session per visual inspection.     General Gait Details: Steady ambulation with RW no overt LOB noted.   Stairs             Wheelchair Mobility     Tilt Bed    Modified Rankin (Stroke Patients Only)       Balance Overall balance assessment: Needs assistance Sitting-balance support: Feet supported Sitting balance-Leahy Scale: Good Sitting balance - Comments: Steady static balance within BOS   Standing balance support: During functional activity, Reliant on assistive device for balance Standing balance-Leahy Scale: Good Standing balance comment: Good static and dynamic standing balance, able to reach outisde her BOS with ambulation.                            Communication Communication Communication: No apparent difficulties  Cognition Arousal: Alert Behavior During Therapy: WFL for tasks assessed/performed   PT - Cognitive impairments: No apparent impairments                       PT - Cognition Comments: Very pleasant Following commands: Intact      Cueing Cueing Techniques: Verbal cues  Exercises General Exercises - Lower Extremity Hip Flexion/Marching: Seated, AROM, 5 reps, Both    General Comments  General comments (skin integrity, edema, etc.): Pre session: HR: 65 bpm, SpO2: 92+% on room air, BP: supine (130/50)(73), BP: seated (134/55)(79), BP: standing (131/57)(78),  Post session: HR: 75 bpm, SpO2: 93+% on room air.      Pertinent Vitals/Pain Pain Assessment Pain Assessment: 0-10 Pain Score: 4  Pain Location: R lower back pain Pain Descriptors /  Indicators: Constant, Shooting, Sharp Pain Intervention(s): Premedicated before session, Limited activity within patient's tolerance, Monitored during session, Repositioned    Home Living                          Prior Function            PT Goals (current goals can now be found in the care plan section) Acute Rehab PT Goals Patient Stated Goal: Go back home, to stop having indigestion PT Goal Formulation: With patient Time For Goal Achievement: 05/26/24 Progress towards PT goals: Progressing toward goals    Frequency    Min 3X/week      PT Plan      Co-evaluation              AM-PAC PT 6 Clicks Mobility   Outcome Measure  Help needed turning from your back to your side while in a flat bed without using bedrails?: None Help needed moving from lying on your back to sitting on the side of a flat bed without using bedrails?: None Help needed moving to and from a bed to a chair (including a wheelchair)?: A Little Help needed standing up from a chair using your arms (e.g., wheelchair or bedside chair)?: A Little Help needed to walk in hospital room?: A Little Help needed climbing 3-5 steps with a railing? : A Little 6 Click Score: 20    End of Session Equipment Utilized During Treatment: Gait belt Activity Tolerance: Patient tolerated treatment well Patient left: in chair;with chair alarm set;with call bell/phone within reach;with family/visitor present (spouse and daughter in room) Nurse Communication: Mobility status PT Visit Diagnosis: Unsteadiness on feet (R26.81);History of falling (Z91.81);Muscle weakness (generalized) (M62.81);Dizziness and giddiness (R42)     Time: 6045-4098 PT Time Calculation (min) (ACUTE ONLY): 25 min  Charges:                           Valeriano Bain, SPT 05/13/24, 4:44 PM

## 2024-05-13 NOTE — Plan of Care (Signed)

## 2024-05-13 NOTE — Progress Notes (Signed)
 Occupational Therapy Treatment Patient Details Name: Julie Mcbride MRN: [id number] DOB: [birth year] Today's Date: 05/13/2024   History of present illness Pt is a 88 y.o female who presents to ED on 05/10/24 with concerns of worsening low back pain. PMH: arthritis, hypertension, ischemic cardiomyopathy,DJD.   OT comments  Pt in bed upon OT arrival with spouse and daughter present in room.  Pt very pleasant and cooperative throughout session and demonstrates good motivation to maximize her level of indep.  Pt tolerated bathroom ADLs well this date, including brief change, toileting/transfer, and standing hand hygiene with general set up/supv.  Pt demonstrates good safety awareness and compensation for balance, appropriately using grab bar during transfer and walker in front of her while lowering/hiking brief.  Pt demonstrated mild dyspnea following ADLs and ambulation while returning to bed, and did endorse commonly feeling some SOB following her shower at home.  Pt reports that she does have a shower chair and a built in shower seat, but that she typically will not utilize these.  OT made recommendation for seated bathing for at least portion of shower, specifically for LB bathing to minimize bending, additionally recommended long handled sponge for EC purposes.  Pt/daughter receptive to all.  Pt able to return demo of PLB technique and 02 sats noted to be 90-93% throughout session on room air.  Pt will continue to benefit from skilled OT to reinforce EC strategies for increasing tolerance to daily tasks.  HH recommended.        If plan is discharge home, recommend the following:  A little help with walking and/or transfers;Assistance with cooking/housework;Help with stairs or ramp for entrance;A little help with bathing/dressing/bathroom   Equipment Recommendations  None recommended by OT    Recommendations for Other Services      Precautions / Restrictions Precautions Precautions:  Fall Recall of Precautions/Restrictions: Intact Restrictions Weight Bearing Restrictions Per Provider Order: No       Mobility Bed Mobility Overal bed mobility: Needs Assistance Bed Mobility: Supine to Sit, Sit to Supine     Supine to sit: Modified independent (Device/Increase time) Sit to supine: Modified independent (Device/Increase time)     Patient Response: Cooperative  Transfers Overall transfer level: Needs assistance Equipment used: Rolling walker (2 wheels) Transfers: Sit to/from Stand Sit to Stand: Supervision           General transfer comment: Good safety and positioning with successful STS from bed at lowest height and RW for support upon standing     Balance Overall balance assessment: Modified Independent Sitting-balance support: Feet supported Sitting balance-Leahy Scale: Good Sitting balance - Comments: Pt able to doff and don new pull up while seated on commode without LOB   Standing balance support: During functional activity, Reliant on assistive device for balance Standing balance-Leahy Scale: Good Standing balance comment: Pt able to stand with wide BOS and no UE support in standing to hike and lower pull up.                           ADL either performed or assessed with clinical judgement   ADL Overall ADL's : Needs assistance/impaired     Grooming: Wash/dry hands;Supervision/safety;Standing Grooming Details (indicate cue type and reason): steadying assist with 1 hand intermittently on countertop for support.             Lower Body Dressing: Set up;Supervision/safety;Sitting/lateral leans Lower Body Dressing Details (indicate cue type and reason): SBA to lower/hike pull  up.  Able to change into clean pull up with set up provided while seated on commode. Toilet Transfer: Supervision/safety;Grab bars;Rolling walker (2 wheels);Ambulation   Toileting- Clothing Manipulation and Hygiene: Set up;Supervision/safety;Sit to/from  stand       Functional mobility during ADLs: Rolling walker (2 wheels);Supervision/safety;Set up General ADL Comments: Ambulatory bed to bathroom for above noted ADLs with supv and RW, very mild dyspnea    Extremity/Trunk Assessment Upper Extremity Assessment Upper Extremity Assessment: Overall WFL for tasks assessed   Lower Extremity Assessment Lower Extremity Assessment: Defer to PT evaluation        Vision       Perception     Praxis     Communication Communication Communication: No apparent difficulties   Cognition Arousal: Alert Behavior During Therapy: WFL for tasks assessed/performed Cognition: No apparent impairments                               Following commands: Intact        Cueing   Cueing Techniques: Verbal cues  Exercises Other Exercises Other Exercises: Educ on PLB and EC with daily tasks    Shoulder Instructions       General Comments 02 sats remained 90-93% on room air throughout session.  Mild dyspnea noted after bathroom ADLs but still at 91%; quick increase to 93% with PLB.    Pertinent Vitals/ Pain       Pain Assessment Pain Assessment: 0-10 Pain Score: 5  Pain Location: R lower back pain Pain Descriptors / Indicators: Constant, Shooting, Sharp, Spasm Pain Intervention(s): Limited activity within patient's tolerance  Home Living                                          Prior Functioning/Environment              Frequency  Min 2X/week        Progress Toward Goals  OT Goals(current goals can now be found in the care plan section)  Progress towards OT goals: Progressing toward goals  Acute Rehab OT Goals Patient Stated Goal: improve pain and d/c home with spouse and home health OT Goal Formulation: With patient/family Time For Goal Achievement: 05/26/24 Potential to Achieve Goals: Good  Plan      Co-evaluation                 AM-PAC OT 6 Clicks Daily Activity     Outcome  Measure   Help from another person eating meals?: None Help from another person taking care of personal grooming?: A Little Help from another person toileting, which includes using toliet, bedpan, or urinal?: A Little Help from another person bathing (including washing, rinsing, drying)?: A Little Help from another person to put on and taking off regular upper body clothing?: None Help from another person to put on and taking off regular lower body clothing?: A Little 6 Click Score: 20    End of Session Equipment Utilized During Treatment: Rolling walker (2 wheels)  OT Visit Diagnosis: Other abnormalities of gait and mobility (R26.89);Unsteadiness on feet (R26.81);Muscle weakness (generalized) (M62.81)   Activity Tolerance Patient tolerated treatment well   Patient Left in bed;with call bell/phone within reach;with bed alarm set;with family/visitor present   Nurse Communication Mobility status;Other (comment) (pt request for muscle relaxer)  Time: 4098-1191 OT Time Calculation (min): 26 min  Charges: OT General Charges $OT Visit: 1 Visit OT Treatments $Self Care/Home Management : 23-37 mins  Marcus Sewer, MS, OTR/L   Casandra Claw 05/13/2024, 3:08 PM

## 2024-05-13 NOTE — Progress Notes (Addendum)
  Progress Note   Patient: Julie Mcbride:[id number] DOB: 01/20/1936 DOA: 05/10/2024     3 DOS: the patient was seen and examined on 05/13/2024   Brief hospital course: [name] is a 88 year old female hypertension, ischemic cardiomyopathy, who presents emergency department for chief concerns of worsening low back pain.  Back pain started suddenly after she woke up from sleep. CT spine showed mild degenerative changes, CT angiogram of the chest did not show any aortic aneurysm or dissection.  However, did show multiple nodules suspected metastatic lung cancer. Due to severity of the pain, patient was placed on hydromorphone  PCA.  Patient was also seen by oncology.    Principal Problem:   Inadequate pain control Active Problems:   CKD stage 3a, GFR 45-59 ml/min (HCC)   Essential hypertension   Ischemic cardiomyopathy   Renovascular hypertension   History of renal artery stenosis   Lung cancer (HCC)   Thrombocytopenia (HCC)   Class 2 obesity   Assessment and Plan: Severe back pain. * Inadequate pain control Based on CT scan results of the spine, no evidence of cancer metastasis to the spine.  Patient has degenerative disc disease.  Severe back pain may be related to muscle strain and degenerative disc disease.  Patient was started on PCA.  Also started prednisone , Lidoderm  patch.  Patient was also given muscle relaxer. 6/15. Condition much improved today, discontinue PCA, start IV and oral as needed pain medicine.  Started PT/OT. 6/16.  Condition continued to improve, minimal use of IV Dilaudid , continue oral Percocet.   Ischemic cardiomyopathy Chronic systolic congestive heart failure. Echocardiogram in 2022 showed ejection fraction 45 to 50%.  Condition stable, no exacerbation.   Essential hypertension Continue bisoprolol , added losartan .  Chronic kidney disease stage IIIa. Renal function still stable, continue to follow.  Thrombocytopenia. Repeat CBC to  make sure it is stable.   Lung cancer (HCC) Multiple lung nodules on CT scan suspect lung cancer.   Patient has evaluated by oncology, ordered tumor marker.  Planning outpatient follow-up.  Class II obesity with BMI 35.12. Diet and excise advised.    Subjective:  Back pain is better controlled with pain medicine.  Physical Exam: Vitals:   05/12/24 1452 05/12/24 2047 05/13/24 0510 05/13/24 0802  BP: (!) 157/63 (!) 152/65 (!) 156/57 (!) 159/65  Pulse: (!) 59 71 66 66  Resp: 16 17  19   Temp: 98.4 F (36.9 C) 98.3 F (36.8 C) 97.9 F (36.6 C) 98.2 F (36.8 C)  TempSrc: Oral Oral Oral   SpO2: 93% 95% 93% 95%  Weight:      Height:       General exam: Appears calm and comfortable  Respiratory system: Clear to auscultation. Respiratory effort normal. Cardiovascular system: S1 & S2 heard, RRR. No JVD, murmurs, rubs, gallops or clicks. No pedal edema. Gastrointestinal system: Abdomen is nondistended, soft and nontender. No organomegaly or masses felt. Normal bowel sounds heard. Central nervous system: Alert and oriented x3. No focal neurological deficits. Extremities: Symmetric 5 x 5 power. Skin: No rashes, lesions or ulcers Psychiatry: Judgement and insight appear normal. Mood & affect appropriate.    Data Reviewed:  Lab results reviewed.  Family Communication: None  Disposition: Status is: Inpatient Remains inpatient appropriate because: Severity of disease. Also pending placement.     Time spent: 35 minutes  Author: Donaciano Frizzle, MD 05/13/2024 11:28 AM  For on call review [email or web address].

## 2024-05-14 ENCOUNTER — Other Ambulatory Visit: Payer: Self-pay

## 2024-05-14 ENCOUNTER — Encounter: Payer: Self-pay | Admitting: *Deleted

## 2024-05-14 DIAGNOSIS — C3490 Malignant neoplasm of unspecified part of unspecified bronchus or lung: Secondary | ICD-10-CM | POA: Diagnosis not present

## 2024-05-14 DIAGNOSIS — R918 Other nonspecific abnormal finding of lung field: Secondary | ICD-10-CM

## 2024-05-14 DIAGNOSIS — R52 Pain, unspecified: Secondary | ICD-10-CM | POA: Diagnosis not present

## 2024-05-14 DIAGNOSIS — I10 Essential (primary) hypertension: Secondary | ICD-10-CM | POA: Diagnosis not present

## 2024-05-14 LAB — URINE CULTURE: Culture: >=100000 — AB

## 2024-05-14 LAB — CBC
HCT: 32 % — ABNORMAL LOW (ref 36.0–46.0)
Hemoglobin: 10.5 g/dL — ABNORMAL LOW (ref 12.0–15.0)
MCH: 29.6 pg (ref 26.0–34.0)
MCHC: 32.8 g/dL (ref 30.0–36.0)
MCV: 90.1 fL (ref 80.0–100.0)
Platelets: 120 10*3/uL — ABNORMAL LOW (ref 150–400)
RBC: 3.55 MIL/uL — ABNORMAL LOW (ref 3.87–5.11)
RDW: 13.3 % (ref 11.5–15.5)
WBC: 6.4 10*3/uL (ref 4.0–10.5)
nRBC: 0 % (ref 0.0–0.2)

## 2024-05-14 LAB — CANCER ANTIGEN 19-9: CA 19-9: 17652 U/mL — ABNORMAL HIGH (ref 0–35)

## 2024-05-14 MED ORDER — PREDNISONE 20 MG PO TABS
20.0000 mg | ORAL_TABLET | Freq: Every day | ORAL | 0 refills | Status: AC
  Filled 2024-05-14: qty 4, 4d supply, fill #0

## 2024-05-14 MED ORDER — SENNOSIDES-DOCUSATE SODIUM 8.6-50 MG PO TABS
2.0000 | ORAL_TABLET | Freq: Two times a day (BID) | ORAL | 0 refills | Status: DC
  Filled 2024-05-14: qty 60, 15d supply, fill #0

## 2024-05-14 MED ORDER — OXYCODONE-ACETAMINOPHEN 5-325 MG PO TABS
1.0000 | ORAL_TABLET | ORAL | 0 refills | Status: DC | PRN
  Filled 2024-05-14: qty 30, 5d supply, fill #0

## 2024-05-14 MED ORDER — METHOCARBAMOL 500 MG PO TABS
500.0000 mg | ORAL_TABLET | Freq: Four times a day (QID) | ORAL | 0 refills | Status: DC | PRN
Start: 2024-05-14 — End: 2024-07-10
  Filled 2024-05-14: qty 30, 8d supply, fill #0

## 2024-05-14 MED ORDER — LACTULOSE 20 GM/30ML PO SOLN
20.0000 g | Freq: Every day | ORAL | 0 refills | Status: DC | PRN
Start: 2024-05-14 — End: 2024-05-30
  Filled 2024-05-14: qty 473, 16d supply, fill #0

## 2024-05-14 MED ORDER — LIDOCAINE 4 % EX PTCH
1.0000 | MEDICATED_PATCH | CUTANEOUS | 0 refills | Status: DC
  Filled 2024-05-14: qty 6, 6d supply, fill #0

## 2024-05-14 NOTE — TOC Transition Note (Signed)
 Transition of Care [facility]) - Discharge Note   Patient Details  Name: Julie Mcbride MRN: [id number] Date of Birth: [birth year]  Transition of [facility]) CM/SW Contact:  Odilia Bennett, LCSW Phone Number: 05/14/2024, 11:13 AM   Clinical Narrative:   Patient has orders to discharge home. [facility] liaison is aware. No further concerns. CSW signing off.  Final next level of care: Home w Home Health Services Barriers to Discharge: Barriers Resolved   Patient Goals and CMS Choice            Discharge Placement                    Patient and family notified of of transfer: 05/14/24  Discharge Plan and Services Additional resources added to the After Visit Summary for                            [facility] Arranged: PT, OT HH Agency: [facility] Date [facility] Agency Contacted: 05/14/24   Representative spoke with at [facility] Agency: [place]   Social Drivers of Health (SDOH) Interventions SDOH Screenings   Food Insecurity: No Food Insecurity (05/10/2024)  Housing: Low Risk  (05/10/2024)  Transportation Needs: No Transportation Needs (05/10/2024)  Utilities: Not At Risk (05/10/2024)  Financial Resource Strain: Low Risk  (08/22/2023)   Received from [facility]  Social Connections: Socially Integrated (05/10/2024)  Tobacco Use: Low Risk  (05/10/2024)     Readmission Risk Interventions     No data to display

## 2024-05-14 NOTE — Discharge Summary (Signed)
 Physician Discharge Summary   Patient: Julie Mcbride MRN: [id number] DOB: 07-Sep-1936  Admit date:     05/10/2024  Discharge date: 05/14/24  Discharge Physician: Donaciano Frizzle   PCP: Monique Ano, MD   Recommendations at discharge:   Follow-up with PCP in 1 week. Follow-up with pulmonology as scheduled for possible bronchoscopy and biopsy. Follow-up with oncology as scheduled.  Discharge Diagnoses: Principal Problem:   Inadequate pain control Active Problems:   CKD stage 3a, GFR 45-59 ml/min (HCC)   Essential hypertension   Ischemic cardiomyopathy   Renovascular hypertension   History of renal artery stenosis   Lung cancer (HCC)   Thrombocytopenia (HCC)   Class 2 obesity  Resolved Problems:   * No resolved hospital problems. [facility] Course: Julie Mcbride is a 88 year old female hypertension, ischemic cardiomyopathy, who presents emergency department for chief concerns of worsening low back pain.  Back pain started suddenly after she woke up from sleep. CT spine showed mild degenerative changes, CT angiogram of the chest did not show any aortic aneurysm or dissection.  However, did show multiple nodules suspected metastatic lung cancer. Due to severity of the pain, patient was placed on hydromorphone  PCA.  Patient was also seen by oncology.  Outpatient follow-up with pulmonology for biopsy.  Condition has improved, had no use of IV pain medicine since yesterday.  Condition improved, medically stable for discharge.   Assessment and Plan: Severe back pain. * Inadequate pain control Based on CT scan results of the spine, no evidence of cancer metastasis to the spine.  Patient has degenerative disc disease.  Severe back pain may be related to muscle strain and degenerative disc disease.  Patient was started on PCA.  Also started prednisone , Lidoderm  patch.  Patient was also given muscle relaxer. 6/15. Condition much improved today, discontinue PCA, start IV and  oral as needed pain medicine.  Started PT/OT. 6/16.  Condition continued to improve, minimal use of IV Dilaudid , continue oral Percocet. 6/17.  Patient has not been using Dilaudid  for the last 24 hours.  Condition improved, medically stable for discharge.   Ischemic cardiomyopathy Chronic systolic congestive heart failure. Echocardiogram in 2022 showed ejection fraction 45 to 50%.  Condition stable, no exacerbation.   Essential hypertension Continue bisoprolol , added losartan .   Chronic kidney disease stage IIIa. Renal function still stable, continue to follow.   Thrombocytopenia. Repeat CBC to make sure it is stable.   Lung cancer (HCC) Multiple lung nodules on CT scan suspect lung cancer.   Patient has evaluated by oncology, ordered tumor marker.  Planning outpatient follow-up.   Class II obesity with BMI 35.12. Diet and excise advised.           Consultants: Oncology  Procedures performed: None  Disposition: Home health Diet recommendation:  Discharge Diet Orders (From admission, onward)     Start     Ordered   05/14/24 0000  Diet - low sodium heart healthy        05/14/24 1030           Cardiac diet DISCHARGE MEDICATION: Allergies as of 05/14/2024       Reactions   Chlorhexidine Hives   SAGE wipes caused severe rash   Cyclobenzaprine Hcl    Doesn't remember reaction   Etodolac    Doesn't remember reaction   Lisinopril    Coughing up blood   Meloxicam    Doesn't remember reaction   Metaxalone    Doesn't remember reaction   Neomycin-bacitracin  Zn-polymyx    Skin irritation   Nifedipine Swelling   Nitrofurantoin    Doesn't remember reaction   Sulfamethoxazole-trimethoprim Hives   Sulfonamide Derivatives Hives   Tizanidine    Liver problems   Vasotec [enalapril Maleate]    Doesn't remember reaction   Ibuprofen Rash   Tape Rash        Medication List     TAKE these medications    atorvastatin  10 MG tablet Commonly known as:  Lipitor Take 1 tablet (10 mg total) by mouth daily.   bisoprolol -hydrochlorothiazide  10-6.25 MG tablet Commonly known as: ZIAC  Take 1 tablet by mouth daily.   cetirizine 10 MG tablet Commonly known as: ZYRTEC Take 10 mg by mouth daily.   clopidogrel  75 MG tablet Commonly known as: PLAVIX  Take 75 mg by mouth daily.   Cranberry 500 MG Caps Take 1 capsule by mouth daily.   donepezil 5 MG tablet Commonly known as: ARICEPT Take 5 mg by mouth at bedtime.   estradiol  0.1 MG/GM vaginal cream Commonly known as: ESTRACE  VAGINAL Apply 0.5mg  (pea-sized amount)  just inside the vaginal introitus with a finger-tip on Monday, Wednesday and Friday nights.   famotidine  20 MG tablet Commonly known as: PEPCID  Take by mouth.   fluticasone 50 MCG/ACT nasal spray Commonly known as: FLONASE Place 2 sprays into both nostrils as needed for allergies or rhinitis.   hydrALAZINE  50 MG tablet Commonly known as: APRESOLINE  Take 50 mg by mouth 3 (three) times daily.   Lactulose 20 GM/30ML Soln Take 30 mLs (20 g total) by mouth daily as needed.   lidocaine  5 % Commonly known as: LIDODERM  Place 1 patch onto the skin daily. Remove & Discard patch within 12 hours or as directed by MD Start taking on: May 15, 2024   losartan  100 MG tablet Commonly known as: COZAAR  Take 100 mg by mouth daily.   methocarbamol 500 MG tablet Commonly known as: ROBAXIN Take 1 tablet (500 mg total) by mouth every 6 (six) hours as needed for muscle spasms.   multivitamin capsule Take 2 capsules by mouth daily. [employer] MVI   oxyCODONE -acetaminophen  5-325 MG tablet Commonly known as: PERCOCET/ROXICET Take 1 tablet by mouth every 4 (four) hours as needed for moderate pain (pain score 4-6).   predniSONE  20 MG tablet Commonly known as: DELTASONE  Take 1 tablet (20 mg total) by mouth daily with breakfast for 4 days. Start taking on: May 15, 2024   rOPINIRole  0.25 MG tablet Commonly known as: REQUIP  Take 0.5 mg by  mouth at bedtime.   senna-docusate 8.6-50 MG tablet Commonly known as: Senokot-S Take 2 tablets by mouth 2 (two) times daily.   Vitamin D  50 MCG (2000 UT) tablet Take 4,000 Units by mouth daily.        Follow-up Information     Monique Ano, MD Follow up in 1 week(s).   Specialty: Family Medicine Contact information: [address] [facility] [place] [address] [phone]         Erskin Hearing, MD Follow up.   Specialty: Pulmonary Disease Why: as scheduled Contact information: [address] [place] [address] [phone]         Shellie Dials, MD Follow up.   Specialty: Oncology Why: as scheduled Contact information: [address] [place] [address] [phone]                Discharge Exam: Filed Weights   05/10/24 1033  Weight: 98.7 kg  General exam: Appears calm and comfortable  Respiratory system: Clear to auscultation. Respiratory effort normal. Cardiovascular system: S1 & S2 heard, RRR. No JVD, murmurs, rubs, gallops or clicks. No pedal edema. Gastrointestinal system: Abdomen is nondistended, soft and nontender. No organomegaly or masses felt. Normal bowel sounds heard. Central nervous system: Alert and oriented. No focal neurological deficits. Extremities: Symmetric 5 x 5 power. Skin: No rashes, lesions or ulcers Psychiatry: Judgement and insight appear normal. Mood & affect appropriate.    Condition at discharge: good  The results of significant diagnostics from this hospitalization (including imaging, microbiology, ancillary and laboratory) are listed below for reference.   Imaging Studies: CT Angio Chest/Abd/Pel for Dissection W and/or Wo Contrast Result Date: 05/10/2024 CLINICAL DATA:  Acute right back and shoulder pain at 2 a.m. today. Clinical concern for aortic dissection. EXAM: CT ANGIOGRAPHY CHEST, ABDOMEN AND PELVIS TECHNIQUE: Multidetector CT imaging through the chest,  abdomen and pelvis was performed using the standard protocol during bolus administration of intravenous contrast. Multiplanar reconstructed images and MIPs were obtained and reviewed to evaluate the vascular anatomy. RADIATION DOSE REDUCTION: This exam was performed according to the departmental dose-optimization program which includes automated exposure control, adjustment of the mA and/or kV according to patient size and/or use of iterative reconstruction technique. CONTRAST:  75mL OMNIPAQUE IOHEXOL 350 MG/ML SOLN COMPARISON:  Chest CT dated 09/09/2013. Chest radiographs dated 03/09/2023 and 12/25/2020 and chest CTA dated 09/08/2013. FINDINGS: CTA CHEST FINDINGS Cardiovascular: Mild atheromatous calcifications including the thoracic aorta. No aortic aneurysm or dissection. Normally opacified pulmonary arteries with no pulmonary arterial filling defects. Mildly enlarged heart with left atrial and left ventricular dilatation. No pericardial effusion. Mediastinum/Nodes: Moderate to large-sized hiatal hernia is again demonstrated. Unremarkable thyroid gland and trachea. Mildly enlarged precarinal node with a short axis diameter of 10 mm on image number 55/6, previously 8 mm. Mildly enlarged right hilar nodes, the largest with a short axis diameter of 11 mm on image number 60/6. Mildly enlarged subcarinal node with a short axis diameter of 10 mm on image number 63/6. Lungs/Pleura: Multiple interval bilateral lung nodules involving all lobes of both lungs. The largest on the right measures 1.6 x 1.3 cm on image number 79/7 in the largest on the left is in the left lower lobe, measuring 1.7 x 1.1 cm on image number 86/7. One nodule is more spiculated than the others. This is in the right lower lobe, measuring 1.7 x 1.1 cm on image number 93/7. Interval small right pleural effusion. The previously demonstrated ground-glass opacity on the right has resolved. Musculoskeletal: Thoracic spine degenerative changes and cervical  spine fixation hardware. No evidence of bony metastatic disease. Review of the MIP images confirms the above findings. CTA ABDOMEN AND PELVIS FINDINGS VASCULAR Aorta: Mild atheromatous calcifications.  No aneurysm or dissection. Celiac: Patent without evidence of aneurysm, dissection, vasculitis or significant stenosis. SMA: Patent without evidence of aneurysm, dissection, vasculitis or significant stenosis. Renals: Proximal left renal artery stent. This is patent. Normal-appearing right renal artery. IMA: Patent without evidence of aneurysm, dissection, vasculitis or significant stenosis. Inflow: Patent without evidence of aneurysm, dissection, vasculitis or significant stenosis. Veins: No obvious venous abnormality within the limitations of this arterial phase study. Review of the MIP images confirms the above findings. NON-VASCULAR Hepatobiliary: No significant change in previously demonstrated liver cysts. No evidence of liver metastatic disease. Normal-appearing gallbladder. Pancreas: Unremarkable. No pancreatic ductal dilatation or surrounding inflammatory changes. Spleen: Normal in size without focal abnormality. Adrenals/Urinary Tract: 1.4 x 1.0 cm  low to medium density left adrenal nodule on image number 58/5 is smaller than on 09/09/2013, measuring 1.8 x 1.3 cm at that time. This currently measures 20 Hounsfield units in density and previously measured 18 Hounsfield units in density. Normal-appearing right adrenal gland. 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 This does not need imaging follow-up. Two adjacent 4 mm calculi in the mid right kidney. Normal-appearing ureters. Small left bladder diverticulum. No hydronephrosis. Stomach/Bowel: Multiple diverticula throughout the colon, most numerous in the sigmoid colon. No evidence of diverticulitis. Stable moderately large hiatal hernia. Unremarkable appendix. Lymphatic: No enlarged lymph nodes. Reproductive:  Status post hysterectomy. No adnexal masses. Other: Moderate-sized bilateral inguinal hernias containing fat. Musculoskeletal: Mild lumbar spine degenerative changes. No evidence of bony metastatic disease. Review of the MIP images confirms the above findings. IMPRESSION: 1. No aortic aneurysm or dissection. 2. Multiple interval bilateral lung nodules, compatible with metastatic disease with a possible right lower lobe primary. A multinodular infectious process is less likely. 3. Mild mediastinal and right hilar lymphadenopathy, compatible with metastatic disease. Reactive adenopathy is less likely. 4. Interval small right pleural effusion. 5. Mild cardiomegaly with left atrial and left ventricular dilatation. 6. Moderate to large-sized hiatal hernia. 7. Colonic diverticulosis. 8. Nonobstructing right renal calculi. 9. Moderate-sized bilateral inguinal hernias containing fat. 10. 1.4 x 1.0 cm low to medium density left adrenal nodule, smaller than on 09/09/2013, compatible with a benign adenoma not needing imaging follow-up. 11. Aortic atherosclerosis. Aortic Atherosclerosis (ICD10-I70.0). Electronically Signed   By: Catherin Closs M.D.   On: 05/10/2024 13:30   CT Thoracic Spine Wo Contrast Result Date: 05/10/2024 CLINICAL DATA:  Acute right back to shoulder pain. EXAM: CT THORACIC SPINE WITHOUT CONTRAST TECHNIQUE: Multidetector CT images of the thoracic were obtained using the standard protocol without intravenous contrast. RADIATION DOSE REDUCTION: This exam was performed according to the departmental dose-optimization program which includes automated exposure control, adjustment of the mA and/or kV according to patient size and/or use of iterative reconstruction technique. COMPARISON:  None Available. FINDINGS: Alignment: Mild-to-moderate kyphosis. Vertebrae: Vertebrae are intact and appear normally mineralized. No lesions are evident. Patient is status post interbody fusion and anterior spinal fixation at C5-6  and C6-7. Paraspinal and other soft tissues: The immediate paraspinous soft tissues are unremarkable. There are pulmonary nodules present and bilateral pleural effusions, worse on the right. There is also moderate hiatus hernia. Disc levels: There is mild disc space narrowing present throughout the thoracic spine. There is no apparent disc herniation or significant spinal canal or neural foraminal stenosis. There is no apparent acute process. IMPRESSION: 1. Mild diffuse degenerative disc disease throughout the thoracic spine without significant spinal canal or neural foraminal stenosis. 2. Numerous pulmonary nodules and bilateral pleural effusions, which will be more definitively assessed in the accompanying CT of the chest. Electronically Signed   By: Maribeth Shivers M.D.   On: 05/10/2024 12:34   CT Lumbar Spine Wo Contrast Result Date: 05/10/2024 CLINICAL DATA:  Acute lower back pain. EXAM: CT LUMBAR SPINE WITHOUT CONTRAST TECHNIQUE: Multidetector CT imaging of the lumbar spine was performed without intravenous contrast administration. Multiplanar CT image reconstructions were also generated. RADIATION DOSE REDUCTION: This exam was performed according to the departmental dose-optimization program which includes automated exposure control, adjustment of the mA and/or kV according to patient size and/or use of iterative reconstruction technique. COMPARISON:  None Available. FINDINGS: Segmentation: The L1 vertebral body has hypoplastic ribs. Alignment: Slight anterolisthesis at L5-S1. Mild dextrocurvature  of the thoracolumbar junction. Vertebrae: Vertebral bodies are intact and maintain their height. No lesions are evident. Paraspinal and other soft tissues: There is no paravertebral soft tissue swelling or hematoma evident. There are nonobstructive calculi present within the right kidney. There is also moderate right renal cortical atrophy and scarring. A fat density lesion is present in the superior pole of  the left kidney. There is a left renal artery stent. There are numerous sigmoid diverticula present. Disc levels: L1-2: Minimal disc bulging and mild facet hypertrophy. No significant spinal canal or neural foraminal stenosis. L2-3: Mild disc bulging and mild facet arthrosis. No significant spinal canal or neural foraminal stenosis. L3-4: Mild diffuse disc bulging and mild to moderate bilateral facet arthrosis, with mild-to-moderate central spinal canal stenosis and bilateral lateral recess stenosis. L4-5: Mild disc bulging and mild bilateral facet arthrosis. Mild right lateral recess and neural foraminal stenosis. L5-S1: Slight degenerative anterolisthesis and severe bilateral facet arthrosis. No significant spinal canal or neural foraminal stenosis. IMPRESSION: 1. Mild diffuse degenerative disc disease and multilevel facet arthrosis, which is most severe at L5-S1. 2. Right nephrolithiasis. 3. Colonic diverticulosis. Electronically Signed   By: Maribeth Shivers M.D.   On: 05/10/2024 12:27    Microbiology: Results for orders placed or performed during the hospital encounter of 05/10/24  Urine Culture (for pregnant, neutropenic or urologic patients or patients with an indwelling urinary catheter)     Status: Abnormal   Collection Time: 05/12/24  1:06 PM   Specimen: Urine, Clean Catch  Result Value Ref Range Status   Specimen Description   Final    URINE, CLEAN CATCH Performed at [facility], [address] [place], [place] [address]    Special Requests   Final    NONE Performed at [facility], [address] [place], [place] [address]    Culture >=100,000 COLONIES/mL ESCHERICHIA COLI (A)  Final   Report Status 05/14/2024 FINAL  Final   Organism ID, Bacteria ESCHERICHIA COLI (A)  Final      Susceptibility   Escherichia coli - MIC*    AMPICILLIN 4 SENSITIVE Sensitive     CEFAZOLIN  <=4 SENSITIVE Sensitive     CEFEPIME  <=0.12 SENSITIVE Sensitive     CEFTRIAXONE  <=0.25  SENSITIVE Sensitive     CIPROFLOXACIN <=0.25 SENSITIVE Sensitive     GENTAMICIN <=1 SENSITIVE Sensitive     IMIPENEM <=0.25 SENSITIVE Sensitive     NITROFURANTOIN <=16 SENSITIVE Sensitive     TRIMETH/SULFA <=20 SENSITIVE Sensitive     AMPICILLIN/SULBACTAM <=2 SENSITIVE Sensitive     PIP/TAZO <=4 SENSITIVE Sensitive ug/mL    * >=100,000 COLONIES/mL ESCHERICHIA COLI    Labs: CBC: Recent Labs  Lab 05/10/24 1057 05/11/24 0448 05/14/24 0424  WBC 10.3 8.6 6.4  NEUTROABS 7.7  --   --   HGB 13.2 11.2* 10.5*  HCT 39.8 34.7* 32.0*  MCV 90.7 92.3 90.1  PLT 180 116* 120*   Basic Metabolic Panel: Recent Labs  Lab 05/10/24 1057 05/11/24 0448  NA 139 137  K 3.9 3.7  CL 105 104  CO2 24 22  GLUCOSE 131* 132*  BUN 31* 25*  CREATININE 1.42* 1.20*  CALCIUM  9.7 8.7*   Liver Function Tests: Recent Labs  Lab 05/10/24 1057  AST 28  ALT 22  ALKPHOS 77  BILITOT 1.0  PROT 6.8  ALBUMIN 3.8   CBG: No results for input(s): GLUCAP in the last 168 hours.  Discharge time spent: greater than 30 minutes.  Signed: Nyala Kirchner,  MD [facility] 05/14/2024

## 2024-05-14 NOTE — Progress Notes (Signed)
 PET scan scheduled for 6/24 at 10:30am. Attempted to contact pt to review appt. Pt did not answer. Message left to return call to review appts.

## 2024-05-14 NOTE — Plan of Care (Signed)

## 2024-05-14 NOTE — Progress Notes (Signed)
 Physical Therapy Treatment Patient Details Name: Julie Mcbride MRN: [id number] DOB: 1936-10-28 Today's Date: 05/14/2024   History of Present Illness Pt is a 88 y.o female who presents to ED on 05/10/24 with concerns of worsening low back pain. PMH: arthritis, hypertension, ischemic cardiomyopathy,DJD.    PT Comments  Pt was long sitting in bed with supportive daughter at bedside. Both patient and family are hopeful to DC home today. Pt endorses LBP 5/10 during session however pain did not limit session progression. Pt demonstrated safe abilities to exit bed, stand, and tolerate ambulate 3 separate occasions. Pt Sao2 during activity remained > 95%. Overall pt is progressing well. Recommend HHPT to maximize her safety and independence with all ADLs.     If plan is discharge home, recommend the following: A little help with walking and/or transfers;A little help with bathing/dressing/bathroom;Assistance with cooking/housework;Assist for transportation;Help with stairs or ramp for entrance     Equipment Recommendations  None recommended by PT       Precautions / Restrictions Precautions Precautions: Fall Recall of Precautions/Restrictions: Intact Restrictions Weight Bearing Restrictions Per Provider Order: No     Mobility  Bed Mobility Overal bed mobility: Modified Independent Bed Mobility: Supine to Sit, Sit to Supine Rolling: Supervision, Used rails Sidelying to sit: Supervision Supine to sit: Supervision  General bed mobility comments: No physical assistance required to exit bed. Did perform log roll technique due to LBP    Transfers Overall transfer level: Needs assistance Equipment used: Rolling walker (2 wheels) Transfers: Sit to/from Stand Sit to Stand: Supervision   Ambulation/Gait Ambulation/Gait assistance: Supervision Gait Distance (Feet): 200 Feet Assistive device: Rolling walker (2 wheels) Gait Pattern/deviations: Step-through pattern Gait velocity: decreased   General Gait Details: Pt ambulated 1 x 200 ft, 1 x 20 ft, 1 x 120 ft with RW. sao2 > 95%   Stairs Stairs: Yes  General stair comments: discussed home stairs and ways to perform. pt elected not to perform at this time due to LBP but both patient/pt's caregivers state confidence in safe abilities to enter/exit home    Balance Overall balance assessment: Needs assistance Sitting-balance support: Feet supported Sitting balance-Leahy Scale: Good     Standing balance support: During functional activity, Reliant on assistive device for balance Standing balance-Leahy Scale: Good       Communication Communication Communication: No apparent difficulties  Cognition Arousal: Alert Behavior During Therapy: WFL for tasks assessed/performed   PT - Cognitive impairments: No apparent impairments    PT - Cognition Comments: Very pleasant Following commands: Intact      Cueing Cueing Techniques: Verbal cues         Pertinent Vitals/Pain Pain Assessment Pain Assessment: 0-10 Pain Score: 5  Pain Location: R lower back pain Pain Descriptors / Indicators: Constant, Shooting, Sharp Pain Intervention(s): Limited activity within patient's tolerance, Monitored during session, Repositioned, Premedicated before session     PT Goals (current goals can now be found in the care plan section) Acute Rehab PT Goals Patient Stated Goal: go home today Progress towards PT goals: Progressing toward goals    Frequency    Min 3X/week       AM-PAC PT 6 Clicks Mobility   Outcome Measure  Help needed turning from your back to your side while in a flat bed without using bedrails?: None Help needed moving from lying on your back to sitting on the side of a flat bed without using bedrails?: None Help needed moving to and from a bed to a chair (  including a wheelchair)?: None Help needed standing up from a chair using your arms (e.g., wheelchair or bedside chair)?: A Little Help needed to walk in  hospital room?: A Little Help needed climbing 3-5 steps with a railing? : A Little 6 Click Score: 21    End of Session   Activity Tolerance: Patient tolerated treatment well Patient left: in chair;with chair alarm set;with call bell/phone within reach;with family/visitor present Nurse Communication: Mobility status PT Visit Diagnosis: Unsteadiness on feet (R26.81);History of falling (Z91.81);Muscle weakness (generalized) (M62.81);Dizziness and giddiness (R42)     Time: 0727-0800 PT Time Calculation (min) (ACUTE ONLY): 33 min  Charges:    $Gait Training: 8-22 mins $Therapeutic Activity: 8-22 mins PT General Charges $$ ACUTE PT VISIT: 1 Visit                    Chester Costa PTA 05/14/24, 8:12 AM

## 2024-05-14 NOTE — Progress Notes (Signed)
 Referral received to help coordinate outpatient appts. Per Dr. Adrian Alba, pt needs further workup with PET scan as outpatient and states that Dr. Aleskerov plans to schedule pt for outpatient bronchoscopy. Orders placed for PET and will call pt with appt once scheduled.

## 2024-05-15 ENCOUNTER — Encounter: Payer: Self-pay | Admitting: *Deleted

## 2024-05-15 DIAGNOSIS — R911 Solitary pulmonary nodule: Secondary | ICD-10-CM | POA: Diagnosis not present

## 2024-05-15 NOTE — Progress Notes (Signed)
 Phone call made to patient's daughter, Ammon Bales. Reviewed upcoming appts for PET scan and hospital follow up with Dr. Adrian Alba. Visitor policy reviewed with pt's daughter. Contact info given and instructed to call with any questions or needs prior to appt. Ammon Bales verbalized understanding.

## 2024-05-16 ENCOUNTER — Other Ambulatory Visit

## 2024-05-16 DIAGNOSIS — M5136 Other intervertebral disc degeneration, lumbar region with discogenic back pain only: Secondary | ICD-10-CM | POA: Diagnosis not present

## 2024-05-16 DIAGNOSIS — Z7952 Long term (current) use of systemic steroids: Secondary | ICD-10-CM | POA: Diagnosis not present

## 2024-05-16 DIAGNOSIS — Z8616 Personal history of COVID-19: Secondary | ICD-10-CM | POA: Diagnosis not present

## 2024-05-16 DIAGNOSIS — I5022 Chronic systolic (congestive) heart failure: Secondary | ICD-10-CM | POA: Diagnosis not present

## 2024-05-16 DIAGNOSIS — Z85828 Personal history of other malignant neoplasm of skin: Secondary | ICD-10-CM | POA: Diagnosis not present

## 2024-05-16 DIAGNOSIS — N1831 Chronic kidney disease, stage 3a: Secondary | ICD-10-CM | POA: Diagnosis not present

## 2024-05-16 DIAGNOSIS — Z6834 Body mass index (BMI) 34.0-34.9, adult: Secondary | ICD-10-CM | POA: Diagnosis not present

## 2024-05-16 DIAGNOSIS — D696 Thrombocytopenia, unspecified: Secondary | ICD-10-CM | POA: Diagnosis not present

## 2024-05-16 DIAGNOSIS — R32 Unspecified urinary incontinence: Secondary | ICD-10-CM | POA: Diagnosis not present

## 2024-05-16 DIAGNOSIS — Z7902 Long term (current) use of antithrombotics/antiplatelets: Secondary | ICD-10-CM | POA: Diagnosis not present

## 2024-05-16 DIAGNOSIS — M150 Primary generalized (osteo)arthritis: Secondary | ICD-10-CM | POA: Diagnosis not present

## 2024-05-16 DIAGNOSIS — E66812 Obesity, class 2: Secondary | ICD-10-CM | POA: Diagnosis not present

## 2024-05-16 DIAGNOSIS — C3490 Malignant neoplasm of unspecified part of unspecified bronchus or lung: Secondary | ICD-10-CM | POA: Diagnosis not present

## 2024-05-16 DIAGNOSIS — K219 Gastro-esophageal reflux disease without esophagitis: Secondary | ICD-10-CM | POA: Diagnosis not present

## 2024-05-16 DIAGNOSIS — I255 Ischemic cardiomyopathy: Secondary | ICD-10-CM | POA: Diagnosis not present

## 2024-05-16 DIAGNOSIS — I150 Renovascular hypertension: Secondary | ICD-10-CM | POA: Diagnosis not present

## 2024-05-17 ENCOUNTER — Other Ambulatory Visit: Payer: Self-pay | Admitting: Pulmonary Disease

## 2024-05-20 ENCOUNTER — Encounter
Admission: RE | Admit: 2024-05-20 | Discharge: 2024-05-20 | Disposition: A | Discharge status: Home / Self Care | Source: Ambulatory Visit | Attending: Pulmonary Disease | Admitting: Pulmonary Disease

## 2024-05-20 ENCOUNTER — Other Ambulatory Visit: Payer: Self-pay

## 2024-05-20 HISTORY — DX: Dyspnea, unspecified: R06.00

## 2024-05-20 HISTORY — DX: Anemia, unspecified: D64.9

## 2024-05-20 HISTORY — DX: Vascular dementia, unspecified severity, without behavioral disturbance, psychotic disturbance, mood disturbance, and anxiety: F01.50

## 2024-05-20 HISTORY — DX: Vascular dementia, unspecified severity, without behavioral disturbance, psychotic disturbance, mood disturbance, and anxiety: F02.80

## 2024-05-20 HISTORY — DX: Acute myocardial infarction, unspecified: I21.9

## 2024-05-20 NOTE — Patient Instructions (Addendum)
 Your procedure is scheduled on: 05/24/24 - Friday Report to the Registration Desk on the 1st floor of the [facility]. To find out your arrival time, please call [phone] between 1PM - 3PM on: 05/23/24 - Thursday If your arrival time is 6:00 am, do not arrive before that time as the [facility] entrance doors do not open until 6:00 am.  REMEMBER: Instructions that are not followed completely may result in serious medical risk, up to and including death; or upon the discretion of your surgeon and anesthesiologist your surgery may need to be rescheduled.  Do not eat food or drink any liquids after midnight the night before surgery.  No gum chewing or hard candies.   One week prior to surgery: Stop Anti-inflammatories (NSAIDS) such as Advil, Aleve, Ibuprofen, Motrin, Naproxen, Naprosyn and Aspirin based products such as Excedrin, Goody's Powder, BC Powder. You may take Tylenol  if needed for pain up until the day of surgery.  Stop ANY OVER THE COUNTER supplements until after surgery :VITAMIN D  , Cranberry ,Multiple Vitamin .  ON THE DAY OF SURGERY ONLY TAKE THESE MEDICATIONS WITH SIPS OF WATER:  famotidine  (PEPCID )  hydrALAZINE  (APRESOLINE )  rOPINIRole  (REQUIP ) oxyCODONE -acetaminophen  if needed methocarbamol  (ROBAXIN ) if needed   No Alcohol for 24 hours before or after surgery.  No Smoking including e-cigarettes for 24 hours before surgery.  No chewable tobacco products for at least 6 hours before surgery.  No nicotine patches on the day of surgery.  Do not use any recreational drugs for at least a week (preferably 2 weeks) before your surgery.  Please be advised that the combination of cocaine and anesthesia may have negative outcomes, up to and including death. If you test positive for cocaine, your surgery will be cancelled.  On the morning of surgery brush your teeth with toothpaste and water, you may rinse your mouth with mouthwash if you wish. Do not swallow any  toothpaste or mouthwash.  Do not wear jewelry, make-up, hairpins, clips or nail polish.  For welded (permanent) jewelry: bracelets, anklets, waist bands, etc.  Please have this removed prior to surgery.  If it is not removed, there is a chance that hospital personnel will need to cut it off on the day of surgery.  Do not wear lotions, powders, or perfumes.   Do not shave body hair from the neck down 48 hours before surgery.  Contact lenses, hearing aids and dentures may not be worn into surgery.  Do not bring valuables to the hospital. [facility] is not responsible for any missing/lost belongings or valuables.   Notify your doctor if there is any change in your medical condition (cold, fever, infection).  Wear comfortable clothing (specific to your surgery type) to the hospital.  After surgery, you can help prevent lung complications by doing breathing exercises.  Take deep breaths and cough every 1-2 hours. Your doctor may order a device called an Incentive Spirometer to help you take deep breaths.  When coughing or sneezing, hold a pillow firmly against your incision with both hands. This is called "splinting." Doing this helps protect your incision. It also decreases belly discomfort.  If you are being admitted to the hospital overnight, leave your suitcase in the car. After surgery it may be brought to your room.  In case of increased patient census, it may be necessary for you, the patient, to continue your postoperative care in the Same Day Surgery department.  If you are being discharged the day of surgery, you  will not be allowed to drive home. You will need a responsible individual to drive you home and stay with you for 24 hours after surgery.   If you are taking public transportation, you will need to have a responsible individual with you.  Please call the Pre-admissions Testing Dept. at [phone] if you have any questions about these instructions.  Surgery  Visitation Policy:  Patients having surgery or a procedure may have two visitors.  Children under the age of 37 must have an adult with them who is not the patient.  Inpatient Visitation:    Visiting hours are 7 a.m. to 8 p.m. Up to four visitors are allowed at one time in a patient room. The visitors may rotate out with other people during the day.  One visitor age 85 or older may stay with the patient overnight and must be in the room by 8 p.m. When coughing or sneezing, hold a pillow firmly against your incision with both hands. This is called "splinting." Doing this helps protect your incision. It also decreases belly discomfort.  If you are being admitted to the hospital overnight, leave your suitcase in the car. After surgery it may be brought to your room.  In case of increased patient census, it may be necessary for you, the patient, to continue your postoperative care in the Same Day Surgery department.  If you are being discharged the day of surgery, you will not be allowed to drive home. You will need a responsible individual to drive you home and stay with you for 24 hours after surgery.   If you are taking public transportation, you will need to have a responsible individual with you.  Please call the Pre-admissions Testing Dept. at [phone] if you have any questions about these instructions.  Surgery Visitation Policy:  Patients having surgery or a procedure may have two visitors.  Children under the age of 60 must have an adult with them who is not the patient.  Inpatient Visitation:    Visiting hours are 7 a.m. to 8 p.m. Up to four visitors are allowed at one time in a patient room. The visitors may rotate out with other people during the day.  One visitor age 4 or older may stay with the patient overnight and must be in the room by 8 p.m.

## 2024-05-21 ENCOUNTER — Ambulatory Visit
Admission: RE | Admit: 2024-05-21 | Discharge: 2024-05-21 | Disposition: A | Discharge status: Home / Self Care | Source: Ambulatory Visit | Attending: Oncology | Admitting: Oncology

## 2024-05-21 ENCOUNTER — Other Ambulatory Visit: Payer: Self-pay | Admitting: Pulmonary Disease

## 2024-05-21 DIAGNOSIS — R911 Solitary pulmonary nodule: Secondary | ICD-10-CM

## 2024-05-21 DIAGNOSIS — K769 Liver disease, unspecified: Secondary | ICD-10-CM | POA: Insufficient documentation

## 2024-05-21 DIAGNOSIS — R918 Other nonspecific abnormal finding of lung field: Secondary | ICD-10-CM | POA: Diagnosis not present

## 2024-05-21 LAB — GLUCOSE, CAPILLARY: Glucose-Capillary: 133 mg/dL — ABNORMAL HIGH (ref 70–99)

## 2024-05-21 MED ORDER — FLUDEOXYGLUCOSE F - 18 (FDG) INJECTION
11.2000 | Freq: Once | INTRAVENOUS | Status: AC | PRN
Start: 2024-05-21 — End: 2024-05-21
  Administered 2024-05-21: 10.15 via INTRAVENOUS

## 2024-05-24 ENCOUNTER — Ambulatory Visit: Admitting: Anesthesiology

## 2024-05-24 ENCOUNTER — Other Ambulatory Visit: Payer: Self-pay

## 2024-05-24 ENCOUNTER — Ambulatory Visit

## 2024-05-24 ENCOUNTER — Ambulatory Visit
Admission: RE | Admit: 2024-05-24 | Discharge: 2024-05-24 | Disposition: A | Discharge status: Home / Self Care | Source: Ambulatory Visit | Attending: Pulmonary Disease

## 2024-05-24 ENCOUNTER — Ambulatory Visit
Admission: RE | Admit: 2024-05-24 | Discharge: 2024-05-24 | Disposition: A | Discharge status: Home / Self Care | Attending: Pulmonary Disease | Admitting: Pulmonary Disease

## 2024-05-24 ENCOUNTER — Encounter
Admission: RE | Disposition: A | Discharge status: Home / Self Care | Payer: Self-pay | Source: Home / Self Care | Attending: Pulmonary Disease

## 2024-05-24 DIAGNOSIS — Z85828 Personal history of other malignant neoplasm of skin: Secondary | ICD-10-CM | POA: Insufficient documentation

## 2024-05-24 DIAGNOSIS — C3492 Malignant neoplasm of unspecified part of left bronchus or lung: Secondary | ICD-10-CM | POA: Diagnosis not present

## 2024-05-24 DIAGNOSIS — C801 Malignant (primary) neoplasm, unspecified: Secondary | ICD-10-CM | POA: Diagnosis not present

## 2024-05-24 DIAGNOSIS — I739 Peripheral vascular disease, unspecified: Secondary | ICD-10-CM | POA: Diagnosis not present

## 2024-05-24 DIAGNOSIS — R590 Localized enlarged lymph nodes: Secondary | ICD-10-CM | POA: Diagnosis not present

## 2024-05-24 DIAGNOSIS — C342 Malignant neoplasm of middle lobe, bronchus or lung: Secondary | ICD-10-CM | POA: Insufficient documentation

## 2024-05-24 DIAGNOSIS — F0280 Dementia in other diseases classified elsewhere without behavioral disturbance: Secondary | ICD-10-CM | POA: Diagnosis not present

## 2024-05-24 DIAGNOSIS — R911 Solitary pulmonary nodule: Secondary | ICD-10-CM | POA: Diagnosis not present

## 2024-05-24 DIAGNOSIS — J90 Pleural effusion, not elsewhere classified: Secondary | ICD-10-CM | POA: Diagnosis not present

## 2024-05-24 DIAGNOSIS — I10 Essential (primary) hypertension: Secondary | ICD-10-CM | POA: Insufficient documentation

## 2024-05-24 DIAGNOSIS — T17890A Other foreign object in other parts of respiratory tract causing asphyxiation, initial encounter: Secondary | ICD-10-CM | POA: Insufficient documentation

## 2024-05-24 DIAGNOSIS — R0602 Shortness of breath: Secondary | ICD-10-CM | POA: Diagnosis not present

## 2024-05-24 DIAGNOSIS — I252 Old myocardial infarction: Secondary | ICD-10-CM | POA: Diagnosis not present

## 2024-05-24 DIAGNOSIS — K219 Gastro-esophageal reflux disease without esophagitis: Secondary | ICD-10-CM | POA: Insufficient documentation

## 2024-05-24 DIAGNOSIS — G308 Other Alzheimer's disease: Secondary | ICD-10-CM | POA: Insufficient documentation

## 2024-05-24 DIAGNOSIS — Z90710 Acquired absence of both cervix and uterus: Secondary | ICD-10-CM | POA: Diagnosis not present

## 2024-05-24 DIAGNOSIS — C3411 Malignant neoplasm of upper lobe, right bronchus or lung: Secondary | ICD-10-CM | POA: Diagnosis not present

## 2024-05-24 DIAGNOSIS — C3491 Malignant neoplasm of unspecified part of right bronchus or lung: Secondary | ICD-10-CM | POA: Diagnosis not present

## 2024-05-24 DIAGNOSIS — X58XXXA Exposure to other specified factors, initial encounter: Secondary | ICD-10-CM | POA: Diagnosis not present

## 2024-05-24 DIAGNOSIS — E66813 Obesity, class 3: Secondary | ICD-10-CM | POA: Insufficient documentation

## 2024-05-24 DIAGNOSIS — F0150 Vascular dementia without behavioral disturbance: Secondary | ICD-10-CM | POA: Diagnosis not present

## 2024-05-24 DIAGNOSIS — Z6834 Body mass index (BMI) 34.0-34.9, adult: Secondary | ICD-10-CM | POA: Diagnosis not present

## 2024-05-24 DIAGNOSIS — R0489 Hemorrhage from other sites in respiratory passages: Secondary | ICD-10-CM | POA: Diagnosis not present

## 2024-05-24 DIAGNOSIS — C771 Secondary and unspecified malignant neoplasm of intrathoracic lymph nodes: Secondary | ICD-10-CM | POA: Diagnosis not present

## 2024-05-24 DIAGNOSIS — R0989 Other specified symptoms and signs involving the circulatory and respiratory systems: Secondary | ICD-10-CM | POA: Diagnosis not present

## 2024-05-24 DIAGNOSIS — Z48813 Encounter for surgical aftercare following surgery on the respiratory system: Secondary | ICD-10-CM | POA: Diagnosis not present

## 2024-05-24 DIAGNOSIS — R918 Other nonspecific abnormal finding of lung field: Secondary | ICD-10-CM | POA: Diagnosis not present

## 2024-05-24 HISTORY — PX: VIDEO BRONCHOSCOPY WITH ENDOBRONCHIAL NAVIGATION: SHX6175

## 2024-05-24 HISTORY — PX: VIDEO BRONCHOSCOPY WITH ENDOBRONCHIAL ULTRASOUND: SHX6177

## 2024-05-24 SURGERY — VIDEO BRONCHOSCOPY WITH ENDOBRONCHIAL NAVIGATION
Anesthesia: General

## 2024-05-24 MED ORDER — PROPOFOL 10 MG/ML IV BOLUS
INTRAVENOUS | Status: DC | PRN
  Administered 2024-05-24: 100 ug/kg/min via INTRAVENOUS
  Administered 2024-05-24: 100 mg via INTRAVENOUS

## 2024-05-24 MED ORDER — KETOROLAC TROMETHAMINE 30 MG/ML IJ SOLN: INTRAMUSCULAR | Status: DC | PRN

## 2024-05-24 MED ORDER — MIDAZOLAM HCL 2 MG/2ML IJ SOLN
INTRAMUSCULAR | Status: AC
  Filled 2024-05-24: qty 2

## 2024-05-24 MED ORDER — SUGAMMADEX SODIUM 200 MG/2ML IV SOLN
INTRAVENOUS | Status: DC | PRN
  Administered 2024-05-24: 200 mg via INTRAVENOUS

## 2024-05-24 MED ORDER — ROCURONIUM BROMIDE 100 MG/10ML IV SOLN
INTRAVENOUS | Status: DC | PRN
  Administered 2024-05-24: 70 mg via INTRAVENOUS

## 2024-05-24 MED ORDER — IPRATROPIUM-ALBUTEROL 0.5-2.5 (3) MG/3ML IN SOLN
3.0000 mL | Freq: Once | RESPIRATORY_TRACT | Status: AC
  Administered 2024-05-24: 3 mL via RESPIRATORY_TRACT

## 2024-05-24 MED ORDER — FENTANYL CITRATE (PF) 100 MCG/2ML IJ SOLN: 25.0000 ug | INTRAMUSCULAR | Status: DC | PRN

## 2024-05-24 MED ORDER — ONDANSETRON HCL 4 MG/2ML IJ SOLN: 4.0000 mg | Freq: Once | INTRAMUSCULAR | Status: DC | PRN

## 2024-05-24 MED ORDER — FENTANYL CITRATE (PF) 100 MCG/2ML IJ SOLN
INTRAMUSCULAR | Status: AC
  Filled 2024-05-24: qty 2

## 2024-05-24 MED ORDER — LIDOCAINE HCL URETHRAL/MUCOSAL 2 % EX GEL
CUTANEOUS | Status: AC
  Filled 2024-05-24: qty 6

## 2024-05-24 MED ORDER — PROPOFOL 10 MG/ML IV BOLUS
INTRAVENOUS | Status: AC
  Filled 2024-05-24: qty 20

## 2024-05-24 MED ORDER — ONDANSETRON HCL 4 MG/2ML IJ SOLN
INTRAMUSCULAR | Status: DC | PRN
  Administered 2024-05-24: 4 mg via INTRAVENOUS

## 2024-05-24 MED ORDER — FENTANYL CITRATE (PF) 100 MCG/2ML IJ SOLN
INTRAMUSCULAR | Status: DC | PRN
  Administered 2024-05-24: 25 ug via INTRAVENOUS
  Administered 2024-05-24: 50 ug via INTRAVENOUS
  Administered 2024-05-24: 25 ug via INTRAVENOUS

## 2024-05-24 MED ORDER — ORAL CARE MOUTH RINSE: 15.0000 mL | Freq: Once | OROMUCOSAL | Status: DC

## 2024-05-24 MED ORDER — LIDOCAINE HCL (CARDIAC) PF 100 MG/5ML IV SOSY
PREFILLED_SYRINGE | INTRAVENOUS | Status: DC | PRN
  Administered 2024-05-24: 100 mg via INTRAVENOUS

## 2024-05-24 MED ORDER — ROCURONIUM BROMIDE 10 MG/ML (PF) SYRINGE
PREFILLED_SYRINGE | INTRAVENOUS | Status: AC
  Filled 2024-05-24: qty 10

## 2024-05-24 MED ORDER — PROPOFOL 1000 MG/100ML IV EMUL
INTRAVENOUS | Status: AC
Start: 2024-05-24 — End: 2024-05-24
  Filled 2024-05-24: qty 100

## 2024-05-24 MED ORDER — DEXAMETHASONE SODIUM PHOSPHATE 10 MG/ML IJ SOLN
INTRAMUSCULAR | Status: DC | PRN
  Administered 2024-05-24: 5 mg via INTRAVENOUS

## 2024-05-24 MED ORDER — CHLORHEXIDINE GLUCONATE 0.12 % MT SOLN
OROMUCOSAL | Status: AC
  Filled 2024-05-24: qty 15

## 2024-05-24 MED ORDER — IPRATROPIUM-ALBUTEROL 0.5-2.5 (3) MG/3ML IN SOLN
RESPIRATORY_TRACT | Status: AC
  Filled 2024-05-24: qty 3

## 2024-05-24 MED ORDER — SUCCINYLCHOLINE CHLORIDE 200 MG/10ML IV SOSY
PREFILLED_SYRINGE | INTRAVENOUS | Status: AC
  Filled 2024-05-24: qty 10

## 2024-05-24 MED ORDER — LACTATED RINGERS IV SOLN: INTRAVENOUS | Status: DC

## 2024-05-24 NOTE — Procedures (Signed)
 ROBOTIC NAVIGATIONAL BRONCHOSCOPY PROCEDURE NOTE  FIBEROPTIC BRONCHOSCOPY WITH THERAPEUTIC ASPIRATION OF TRACHEOBRONCHIAL TREE AND BRONCHOALVEOLAR LAVAGE PROCEDURE NOTE  ENDOBRONCHIAL ULTRASOUND >/= 3 LYMPH NODES PROCEDURE NOTE  TRANSBRONCHIAL NEEDLE ASPIRATION   BRONCHOSCOPY WITH ENDOBRONCHIAL BRUSHINGS  TRANSBRONCHIAL SURGICAL BIOPSY  BRONCHOSCOPY WITH RADIAL ULTRASOUND  Flexible bronchoscopy was performed  by : Parris MD  assistance by : 1)Repiratory therapist  and 2)[employer] staff and 3) Anesthesia team and 4) Flouroscopy team and 5) [employer] supporting staff   Indication for the procedure was :  Pre-procedural H&P. The following assessment was performed on the day of the procedure prior to initiating sedation History:  Chest pain n Dyspnea y Hemoptysis n Cough y Fever n Other pertinent items n  Examination Vital signs -reviewed as per nursing documentation today Cardiac    Murmurs: n  Rubs : n  Gallop: n Lungs Wheezing: n Rales : n Rhonchi :y  Other pertinent findings: SOB/hypoxemia due to chronic lung disease   Pre-procedural assessment for Procedural Sedation included: Depth of sedation: As per anesthesia team  ASA Classification:  2 Mallampati airway assessment: 3    Medication list reviewed: y  The patient's interval history was taken and revealed: no new complaints The pre- procedure physical examination revealed: No new findings Refer to prior clinic note for details.  Informed Consent: Informed consent was obtained from:  patient after explanation of procedure and risks, benefits, as well as alternative procedures available.  Explanation of level of sedation and possible transfusion was also provided.    Procedural Preparation: Time out was performed and patient was identified by name and birthdate and procedure to be performed and side for sampling, if any, was specified. Pt was intubated by anesthesia.  The patient was appropriately  draped.   Fiberoptic bronchoscopy with airway inspection and BAL Procedure findings:  Bronchoscope was inserted via ETT  without difficulty.  Posterior oropharynx, epiglottis, arytenoids, false cords and vocal cords were not visualized as these were bypassed by endotracheal tube. The distal trachea was normal in circumference and appearance without mucosal, cartilaginous or branching abnormalities.  Anthracotic pigment was noted bilaterally. Mucus plugging was noted worse at RLL, this was treated with therapeutic aspiration. BAL was performed at LLL and RUL.   The mucosa was : friable at RUL  Airways were notable for:        exophytic lesions :n       extrinsic compression in the following distributions: n.       Friable mucosa: y       Teacher, music /pigmentation: yes      Media Information  Document Information  ANTHRACOTIC PIGMENT   Post procedure Diagnosis:   MUCUS PLUGGING WITH ANTHRACOTIC PIGMENT BILATERALLY     Robotic Navigational Bronchoscopy Procedure Findings:   Post appropriate planning and registration peripheral navigation was used to visualize target lesion.     TARGET 1 - (ANTERIOR RIGHT MIDDLE LOBE) TRANSBRONCHIAL FNA X 3, SURGICAL TRANSBRONCHIAL LUNG BIOPSY X 3, CYTOBRUSH X 1, BAL FOR CYTOLOGY X 1   TARGET  2 - (LEFT LOWER LOBE) TRANSBRONCHIAL FNA X 3, SURGICAL TRANSBRONCHIAL LUNG BIOPSY X 3, CYTOBRUSH X 1, BAL FOR CYTOLOGY X 1           Media Information  Document Information  RADIAL EBUS CONCENTRIC CONFIRMATION TARGET 1       Media Information  Document Information  RADIAL EBUS ECCENTRIC CONFIRMATION TARGET 2      TARGET 1 TOOL IN LESION RADIOLOGY CONFIRMATION FNA    TOOL  IN LESION 3D RADIOLOGY CONFIRMATION LESION 2 FNA     Endobronchial ultrasound assisted hilar and mediastinal lymph node biopsies procedure findings: The fiberoptic bronchoscope was removed and the EBUS scope was introduced. Examination began to  evaluate for pathologically enlarged lymph nodes starting on the RIGHT  side progressing to the LEFT side.  All lymph node biopsies performed with 21G needle. Lymph node biopsies were sent in cytolite for all stations.  STATION 4R - 1.4CM - 3 BIOPSIES STATION 7 - 1.5CM - 3 BIOPSIES  STATION 10L - 1.4CM 3 BIOPSIES  Immediate sampling complications included:NONE Epinephrine  ZEROml was used topically  The bronchoscopy was terminated due to completion of the planned procedure and the bronchoscope was removed.   Total dosage of Lidocaine  was 3mg   Estimated Blood loss: <5 EXPECTEDcc.  Complications included:  NONE IMMEDIATE   Preliminary CXR findings :  IN PROCESS   Disposition: HOME WITH FAMILY   Follow up with Dr. Dyanara Cozza in 5 days for result discussion.     Halina Picking MD  [facility] Division of Pulmonary & Critical Care Medicine

## 2024-05-24 NOTE — Transfer of Care (Signed)
 Immediate Anesthesia Transfer of Care Note  Patient: Julie Mcbride  Procedure(s) Performed: VIDEO BRONCHOSCOPY WITH ENDOBRONCHIAL NAVIGATION BRONCHOSCOPY, WITH EBUS  Patient Location: PACU  Anesthesia Type:General  Level of Consciousness: awake, alert , and oriented  Airway & Oxygen Therapy: Patient Spontanous Breathing and Patient connected to face mask oxygen  Post-op Assessment: Report given to RN and Post -op Vital signs reviewed and stable  Post vital signs: Reviewed and stable  Last Vitals:  Vitals Value Taken Time  BP 113/75 05/24/24 15:48  Temp    Pulse 71 05/24/24 15:50  Resp 18 05/24/24 15:50  SpO2 97 % 05/24/24 15:50  Vitals shown include unfiled device data.  Last Pain:  Vitals:   05/24/24 1202  TempSrc: Oral         Complications: No notable events documented.

## 2024-05-24 NOTE — Anesthesia Preprocedure Evaluation (Addendum)
 Anesthesia Evaluation  Patient identified by MRN, date of birth, ID band Patient awake    Reviewed: Allergy & Precautions, NPO status , Patient's Chart, lab work & pertinent test results  Airway Mallampati: III  TM Distance: >3 FB Neck ROM: Limited    Dental  (+) Teeth Intact   Pulmonary neg pulmonary ROS, shortness of breath and with exertion   Pulmonary exam normal breath sounds clear to auscultation       Cardiovascular Exercise Tolerance: Poor hypertension, Pt. on medications + Past MI and + Peripheral Vascular Disease  Normal cardiovascular exam Rhythm:Regular Rate:Normal     Neuro/Psych       Dementia negative neurological ROS  negative psych ROS   GI/Hepatic negative GI ROS, Neg liver ROS,GERD  Medicated,,  Endo/Other  negative endocrine ROS  Class 3 obesity  Renal/GU   negative genitourinary   Musculoskeletal  (+) Arthritis ,    Abdominal  (+) + obese  Peds negative pediatric ROS (+)  Hematology negative hematology ROS (+) Blood dyscrasia, anemia   Anesthesia Other Findings Past Medical History: No date: Anemia No date: Arthritis     Comment:  Knees, hands No date: Basal cell carcinoma     Comment:  back - removed No date: Bladder polyp     Comment:  With previos hematuria No date: Diverticulitis No date: Dyspnea No date: Dyspnea on exertion No date: GERD (gastroesophageal reflux disease) No date: History of ETT     Comment:  Myoview 7/10 EF 78%, no ischemia or infarction. Poor               exercise tolerance (2'30, stopped due to fatigue).               Reached 86% MPHR. 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 IVAR Mori No date: BACK SURGERY No date: BASAL CELL CARCINOMA EXCISION     Comment:  back 06/19/2017: BREAST BIOPSY; Right     Comment:  Affirm Bx- Radial Scar 1996: BREAST EXCISIONAL BIOPSY; Right     Comment:  neg 08/11/2017: BREAST LUMPECTOMY WITH NEEDLE LOCALIZATION; Right     Comment:  Procedure: BREAST LUMPECTOMY WITH NEEDLE LOCALIZATION;                Surgeon: Claudene Larinda Bolder, MD;  Location: [facility];                Service: General;  Laterality: Right; No date: CARPAL TUNNEL RELEASE; Right No date: CATARACT EXTRACTION W/ INTRAOCULAR LENS  IMPLANT, BILATERAL 08/11/2017: EXCISION OF BREAST BIOPSY; Right     Comment:  excision of radial Scar No date: EYE SURGERY No date: JOINT REPLACEMENT; Right     Comment:  knee 04/22/2016: KNEE ARTHROSCOPY; Right     Comment:  Procedure: ARTHROSCOPY KNEE;  Surgeon: Helayne Glenn,               MD;  Location: [facility];  Service:               Orthopedics;  Laterality: Right; 01/11/2017:  knee replacement; Right 06/05/14: NASAL SINUS SURGERY     Comment:  Dr. Milissa 2005: POSTERIOR CERVICAL LAMINECTOMY     Comment:  C5-7; plate and screws 04/20/2020: RENAL ANGIOGRAPHY; Left     Comment:  Procedure: RENAL ANGIOGRAPHY;  Surgeon: Marea Selinda RAMAN,               MD;  Location: [facility];  Service:               Cardiovascular;  Laterality: Left; No date: TONSILLECTOMY  BMI    Body Mass Index: 34.86 kg/m      Reproductive/Obstetrics negative OB ROS                             Anesthesia Physical Anesthesia Plan  ASA: 3  Anesthesia Plan: General   Post-op Pain Management:    Induction: Intravenous  PONV Risk Score and Plan: Ondansetron , Dexamethasone , Midazolam  and Treatment may vary due to age or medical  condition  Airway Management Planned: Oral ETT  Additional Equipment:   Intra-op Plan:   Post-operative Plan: Extubation in OR  Informed Consent: I have reviewed the patients History and Physical, chart, labs and discussed the procedure including the risks, benefits and alternatives for the proposed anesthesia with the patient or authorized representative who has indicated his/her understanding and acceptance.     Dental Advisory Given  Plan Discussed with: CRNA  Anesthesia Plan Comments:        Anesthesia Quick Evaluation

## 2024-05-24 NOTE — H&P (Signed)
 PULMONOLOGY         Date: 05/24/2024,   MRN# [id number] LAELYN BLUMENTHAL Jul 07, 1936     AdmissionWeight: 98 kg                 CurrentWeight: 98 kg  Referring provider: Dr Alla   CHIEF COMPLAINT:   Bilateral pulmonary nodules    HISTORY OF PRESENT ILLNESS   This is a 88 yo F with hx skin cancer, anemia, dyspnea, GERD, MI, hysterectomy who came in with evaluation of abnormal chest imaging. She had CT chest with bilateral pulmonary nodules. She came in with her family and this was reviewed with them.  After discussion with patient and family it was decided to proceed with lung biopsy.  She has bilateral nodules and hilar adenopathy.  Neoplasm cannot be ruled out. Today we plan to perform flexible bronchosopy with airway inspection followed by therapeutic aspiration of tracheobronchial tree for mucus plugging if any is noted during airway examination, then will perform bronchoalveolar lavage, followed by brushings and biopsy of lesions in both lungs and lastly lymph node staging via endobronchial ultrasound.      -Reviewed risks/complications and benefits with patient, risks include infection, pneumothorax/pneumomediastinum which may require chest tube placement as well as overnight/prolonged hospitalization and possible mechanical ventilation. Other risks include bleeding and very rarely death.  Patient understands risks and wishes to proceed.  Additional questions were answered, and patient is aware that post procedure patient will be going home with family and may experience cough with possible clots on expectoration as well as phlegm which may last few days as well as hoarseness of voice post intubation and mechanical ventilation.    PAST MEDICAL HISTORY   Past Medical History:  Diagnosis Date   Anemia    Arthritis    Knees, hands   Basal cell carcinoma    back - removed   Bladder polyp    With previos hematuria   Diverticulitis    Dyspnea    Dyspnea on  exertion    GERD (gastroesophageal reflux disease)    History of ETT    Myoview 7/10 EF 78%, no ischemia or infarction. Poor exercise tolerance (2'30, stopped due to fatigue). Reached 86% MPHR.   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 IVAR Mori   BACK SURGERY     BASAL CELL CARCINOMA EXCISION     back   BREAST BIOPSY Right 06/19/2017   Affirm Bx- Radial Scar   BREAST EXCISIONAL BIOPSY Right 1996   neg   BREAST LUMPECTOMY WITH NEEDLE LOCALIZATION Right 08/11/2017   Procedure: BREAST LUMPECTOMY WITH NEEDLE LOCALIZATION;  Surgeon: Claudene Larinda Bolder, MD;  Location: [facility];  Service: General;  Laterality: Right;   CARPAL TUNNEL RELEASE Right    CATARACT EXTRACTION W/ INTRAOCULAR LENS  IMPLANT, BILATERAL     EXCISION OF BREAST BIOPSY Right 08/11/2017   excision of radial Scar   EYE SURGERY     JOINT  REPLACEMENT Right    knee   KNEE ARTHROSCOPY Right 04/22/2016   Procedure: ARTHROSCOPY KNEE;  Surgeon: Helayne Glenn, MD;  Location: [facility];  Service: Orthopedics;  Laterality: Right;   knee replacement Right 01/11/2017   NASAL SINUS SURGERY  06/05/14   Dr. Milissa   POSTERIOR CERVICAL LAMINECTOMY  2005   C5-7; plate and screws   RENAL ANGIOGRAPHY Left 04/20/2020   Procedure: RENAL ANGIOGRAPHY;  Surgeon: Marea Selinda RAMAN, MD;  Location: [facility];  Service: Cardiovascular;  Laterality: Left;   TONSILLECTOMY       FAMILY HISTORY   Family History  Problem Relation Age of Onset   Ovarian cancer Mother    Stroke Father        CVA   Breast  cancer Daughter 32   Breast cancer Maternal Aunt 48   Breast cancer Maternal Aunt 90   Prostate cancer Neg Hx    Bladder Cancer Neg Hx    Kidney cancer Neg Hx      SOCIAL HISTORY   Social History   Tobacco Use   Smoking status: Never   Smokeless tobacco: Never  Vaping Use   Vaping status: Never Used  Substance Use Topics   Alcohol use: No   Drug use: No     MEDICATIONS    Home Medication:    Current Medication:  Current Facility-Administered Medications:    lactated ringers  infusion, , Intravenous, Continuous, Mazzoni, Andrea, MD   Oral care mouth rinse, 15 mL, Mouth Rinse, Once, Mazzoni, Andrea, MD    ALLERGIES   Chlorhexidine, Cyclobenzaprine hcl, Etodolac, Lisinopril, Meloxicam, Metaxalone, Neomycin-bacitracin zn-polymyx, Nifedipine, Nitrofurantoin, Sulfamethoxazole-trimethoprim, Sulfonamide derivatives, Tizanidine, Vasotec [enalapril maleate], Ibuprofen, and Tape     REVIEW OF SYSTEMS    Review of Systems:  Gen:  Denies  fever, sweats, chills weigh loss  HEENT: Denies blurred vision, double vision, ear pain, eye pain, hearing loss, nose bleeds, sore throat Cardiac:  No dizziness, chest pain or heaviness, chest tightness,edema Resp:   reports dyspnea chronically  Gi: Denies swallowing difficulty, stomach pain, nausea or vomiting, diarrhea, constipation, bowel incontinence Gu:  Denies bladder incontinence, burning urine Ext:   Denies Joint pain, stiffness or swelling Skin: Denies  skin rash, easy bruising or bleeding or hives Endoc:  Denies polyuria, polydipsia , polyphagia or weight change Psych:   Denies depression, insomnia or hallucinations   Other:  All other systems negative   VS: BP (!) 151/63   Pulse 71   Temp 97.8 F (36.6 C) (Oral)   Resp 16   Ht 5' 6 (1.676 m)   Wt 98 kg   SpO2 96%   BMI 34.86 kg/m      PHYSICAL EXAM    GENERAL:NAD, no fevers, chills, no weakness no fatigue HEAD: Normocephalic, atraumatic.  EYES: Pupils  equal, round, reactive to light. Extraocular muscles intact. No scleral icterus.  MOUTH: Moist mucosal membrane. Dentition intact. No abscess noted.  EAR, NOSE, THROAT: Clear without exudates. No external lesions.  NECK: Supple. No thyromegaly. No nodules. No JVD.  PULMONARY: decreased breath sounds with mild rhonchi worse at bases bilaterally.  CARDIOVASCULAR: S1 and S2. Regular rate and rhythm. No murmurs, rubs, or gallops. No edema. Pedal pulses 2+ bilaterally.  GASTROINTESTINAL: Soft, nontender, nondistended. No masses. Positive bowel sounds. No hepatosplenomegaly.  MUSCULOSKELETAL: No swelling, clubbing, or edema. Range of motion full in all extremities.  NEUROLOGIC: Cranial nerves II through XII are intact. No gross focal neurological deficits. Sensation intact. Reflexes intact.  SKIN: No ulceration, lesions, rashes, or cyanosis. Skin warm and dry. Turgor intact.  PSYCHIATRIC: Mood, affect within normal limits. The patient is awake, alert and oriented x 3. Insight, judgment intact.       IMAGING   ADDENDUM REPORT: 05/23/2024 12:28   ADDENDUM: Additional impression. On the very first image is a slight area of asymmetric uptake along the right frontal region of intracranial compartment. This could be technical. In light of the other findings however if there is further concern additional MRI may be useful to exclude underlying lesion.     Electronically Signed   By: Ranell Bring M.D.   On: 05/23/2024 12:28    Addended by Bring Ranell POUR, MD on 05/23/2024 12:30 PM    Study Result  Narrative & Impression  CLINICAL DATA:  Initial treatment strategy for multiple lung nodules.   EXAM: NUCLEAR MEDICINE PET SKULL BASE TO THIGH   TECHNIQUE: 10.15 mCi F-18 FDG was injected intravenously. Full-ring PET imaging was performed from the skull base to thigh after the radiotracer. CT data was obtained and used for attenuation correction and anatomic localization.   Fasting blood  glucose: 133 mg/dl   COMPARISON:  CTA chest 05/10/2024   FINDINGS: Mediastinal blood pool activity: SUV max 3.1   Liver activity: SUV max 3.9   NECK: No specific abnormal uptake seen in the neck including along lymph node chains of the submandibular, posterior triangle or internal jugular regions. Slight asymmetric area of uptake along the right frontal region, this could be a physiologic variant.   Incidental CT findings: Nasal septal deviation identified. Small rounded opacity along the right maxillary sinus, small mucous retention cyst or polyp. Visualized portions of the mastoid air cells clear. Streak artifact related to the patient's dental hardware and cervical spine fixation hardware. Small thyroid gland. The submandibular glands and parotid glands are preserved.   CHEST: As seen on the recent CT angiogram of the chest from 05/10/2024 there are numerous scattered bilateral lung nodules identified. Many of which show abnormal uptake. There is some limitation today with motion. Lesion seen in the left lower lobe on the prior examination which would have measured 15 by 12 mm, today is seen on image 54 and has some uptake of maximum SUV 4.2. Lesion seen in the anterior right middle lobe maximum SUV of 6.6 is identified on image 47, measuring 12 mm. Several lesions identified do not show significant abnormal uptake. Some of this could relate to also small lesions are versus motion.   There are several hypermetabolic lymph nodes identified in the right axillary region. Maximum SUV value of 8.5. Is also mediastinal nodes including a precarinal lesion which has maximum SUV value 6.1 and measures on image 40 in short axis of 10 mm. Similar size to the recent prior. Is also mild uptake along the left hilum. No abnormal uptake along the axillary regions.   Incidental CT findings: Persistent small right pleural effusion. There is slight increase in tiny left pleural effusion.  Moderate hiatal hernia. Scattered vascular calcifications are identified. No pericardial effusion.   ABDOMEN/PELVIS: There are 3 hypermetabolic lesions identified. Example in segment 4A on image 57 has maximum SUV value of 8.2. Focus seen towards the caudate lobe on image 66 has maximum SUV value of 9.2. There lesion is seen in segment 3 with maximum SUV value 10.3 on image 74. These lesions are not well seen on the CT scan.   Otherwise there is physiologic distribution radiotracer along the parenchymal organs,  bowel and renal collecting systems. No abnormal nodal uptake identified in the abdomen and pelvis.   Incidental CT findings: Significant motion. Benign separate hepatic cystic lesions are identified. Gallbladder is nondilated. Grossly the spleen, adrenal glands and pancreas are unremarkable. Mild bilateral renal atrophy. Calcifications along the right kidney. Contracted urinary bladder. Large bowel has a normal course and caliber with scattered colonic stool and colonic diverticula. Small bowel is nondilated. Note is made of a left renal artery stent.   SKELETON: Multifocal areas of abnormal bony uptake consistent bone metastases. These include the left femoral neck, the left ischial bone, bilateral iliac bones, bilateral ribs and the left humeral head. Example left humeral head lesion has sclerosis on image 21 and maximum SUV value of 11.0. Left superior iliac crest lesion also sclerosis on CT image 95 and maximum SUV of 6.6. Left femoral neck lesion again with sclerosis on image 133 has maximum SUV of 9.2. of note there is also a a photopenic appearance to the T8 vertebral body with some surrounding mild increased uptake. Please correlate with spine CT scan of 05/10/2024.   Incidental CT findings: Curvature of the spine. Diffuse degenerative changes identified.   IMPRESSION: Multifocal hypermetabolic metastatic disease. Several lesions scattered throughout the  skeleton.   Multiple lung nodules are identified as on prior examination. Some of these do not show significant uptake while others show clear abnormal uptake. There is significant breathing motion.   Abnormal mediastinal and right hilar lymph nodes.   At least 3 hypermetabolic liver lesions. If needed additional workup with liver MRI as clinically appropriate.   Electronically Signed: By: Ranell Bring M.D.     ASSESSMENT/PLAN    Bilateral pulmonary nodules with hilar adenopathy Today we plan to perform flexible bronchosopy with airway inspection followed by therapeutic aspiration of tracheobronchial tree for mucus plugging if any is noted during airway examination, then will perform bronchoalveolar lavage, followed by brushings and biopsy of lesions in both lungs and lastly lymph node staging via endobronchial ultrasound.      -Reviewed risks/complications and benefits with patient, risks include infection, pneumothorax/pneumomediastinum which may require chest tube placement as well as overnight/prolonged hospitalization and possible mechanical ventilation. Other risks include bleeding and very rarely death.  Patient understands risks and wishes to proceed.  Additional questions were answered, and patient is aware that post procedure patient will be going home with family and may experience cough with possible clots on expectoration as well as phlegm which may last few            Thank you for allowing me to participate in the care of this patient.   Patient/Family are satisfied with care plan and all questions have been answered.    Provider disclosure: Patient with at least one acute or chronic illness or injury that poses a threat to life or bodily function and is being managed actively during this encounter.  All of the below services have been performed independently by signing provider:  review of prior documentation from internal and or external health records.  Review  of previous and current lab results.  Interview and comprehensive assessment during patient visit today. Review of current and previous chest radiographs/CT scans. Discussion of management and test interpretation with health care team and patient/family.   This document was prepared using Dragon voice recognition software and may include unintentional dictation errors.     Durene Dodge, M.D.  Division of Pulmonary & Critical Care Medicine

## 2024-05-24 NOTE — Anesthesia Postprocedure Evaluation (Signed)
 Anesthesia Post Note  Patient: THRESA DOZIER  Procedure(s) Performed: VIDEO BRONCHOSCOPY WITH ENDOBRONCHIAL NAVIGATION BRONCHOSCOPY, WITH EBUS  Patient location during evaluation: PACU Anesthesia Type: General Level of consciousness: awake and alert Pain management: pain level controlled Vital Signs Assessment: post-procedure vital signs reviewed and stable Respiratory status: spontaneous breathing, nonlabored ventilation, respiratory function stable and patient connected to nasal cannula oxygen Cardiovascular status: blood pressure returned to baseline and stable Postop Assessment: no apparent nausea or vomiting Anesthetic complications: no   No notable events documented.   Last Vitals:  Vitals:   05/24/24 1630 05/24/24 1649  BP: (!) 155/55 (!) 166/64  Pulse: 78 77  Resp: 16 16  Temp:  (!) 36.3 C  SpO2: 93% 93%    Last Pain:  Vitals:   05/24/24 1649  TempSrc: Temporal  PainSc: 0-No pain                 Lendia LITTIE Mae

## 2024-05-24 NOTE — Anesthesia Procedure Notes (Signed)
 Procedure Name: Intubation Date/Time: 05/24/2024 1:48 PM  Performed by: Dominica Krabbe, CRNAPre-anesthesia Checklist: Patient identified, Emergency Drugs available, Suction available, Patient being monitored and Timeout performed Patient Re-evaluated:Patient Re-evaluated prior to induction Oxygen Delivery Method: Circle system utilized Preoxygenation: Pre-oxygenation with 100% oxygen Induction Type: IV induction Ventilation: Mask ventilation without difficulty Laryngoscope Size: McGrath and 3 Grade View: Grade II Tube type: Oral Tube size: 9.0 mm Number of attempts: 1 Airway Equipment and Method: Stylet and Video-laryngoscopy Placement Confirmation: ETT inserted through vocal cords under direct vision, positive ETCO2 and breath sounds checked- equal and bilateral Secured at: 21 cm Tube secured with: Tape Dental Injury: Teeth and Oropharynx as per pre-operative assessment

## 2024-05-25 ENCOUNTER — Encounter: Payer: Self-pay | Admitting: Pulmonary Disease

## 2024-05-27 ENCOUNTER — Encounter: Payer: Self-pay | Admitting: Pulmonary Disease

## 2024-05-29 LAB — CYTOLOGY - NON PAP

## 2024-05-29 LAB — SURGICAL PATHOLOGY

## 2024-05-30 ENCOUNTER — Other Ambulatory Visit: Payer: Self-pay | Admitting: *Deleted

## 2024-05-30 ENCOUNTER — Encounter: Payer: Self-pay | Admitting: *Deleted

## 2024-05-30 ENCOUNTER — Inpatient Hospital Stay: Attending: Oncology | Admitting: Oncology

## 2024-05-30 ENCOUNTER — Inpatient Hospital Stay: Admitting: Oncology

## 2024-05-30 ENCOUNTER — Encounter: Payer: Self-pay | Admitting: Pulmonary Disease

## 2024-05-30 VITALS — BP 145/60 | HR 65 | Temp 97.5°F | Resp 16 | Ht 66.0 in | Wt 210.9 lb

## 2024-05-30 DIAGNOSIS — C799 Secondary malignant neoplasm of unspecified site: Secondary | ICD-10-CM | POA: Diagnosis not present

## 2024-05-30 DIAGNOSIS — C7801 Secondary malignant neoplasm of right lung: Secondary | ICD-10-CM | POA: Diagnosis not present

## 2024-05-30 DIAGNOSIS — C3490 Malignant neoplasm of unspecified part of unspecified bronchus or lung: Secondary | ICD-10-CM | POA: Insufficient documentation

## 2024-05-30 DIAGNOSIS — C787 Secondary malignant neoplasm of liver and intrahepatic bile duct: Secondary | ICD-10-CM | POA: Insufficient documentation

## 2024-05-30 DIAGNOSIS — C7802 Secondary malignant neoplasm of left lung: Secondary | ICD-10-CM | POA: Insufficient documentation

## 2024-05-30 DIAGNOSIS — R918 Other nonspecific abnormal finding of lung field: Secondary | ICD-10-CM | POA: Diagnosis not present

## 2024-05-30 DIAGNOSIS — R52 Pain, unspecified: Secondary | ICD-10-CM | POA: Diagnosis not present

## 2024-05-30 DIAGNOSIS — C7951 Secondary malignant neoplasm of bone: Secondary | ICD-10-CM | POA: Insufficient documentation

## 2024-05-30 DIAGNOSIS — R600 Localized edema: Secondary | ICD-10-CM | POA: Diagnosis not present

## 2024-05-30 DIAGNOSIS — C3491 Malignant neoplasm of unspecified part of right bronchus or lung: Secondary | ICD-10-CM | POA: Diagnosis not present

## 2024-05-30 LAB — SURGICAL PATHOLOGY

## 2024-05-30 MED ORDER — OXYCODONE-ACETAMINOPHEN 5-325 MG PO TABS: 1.0000 | ORAL_TABLET | ORAL | 0 refills | Status: DC | PRN

## 2024-05-30 NOTE — Progress Notes (Signed)
START ON PATHWAY REGIMEN - Non-Small Cell Lung     A cycle is every 21 days:     Bevacizumab-xxxx      Paclitaxel      Carboplatin   **Always confirm dose/schedule in your pharmacy ordering system**  Patient Characteristics: Stage IV Metastatic, Nonsquamous, Awaiting Molecular Test Results and Need to Start Chemotherapy, PS = 0, 1 Therapeutic Status: Stage IV Metastatic Histology: Nonsquamous Cell Broad Molecular Profiling Status: Awaiting Molecular Test Results and Need to Start Chemotherapy ECOG Performance Status: 1 Intent of Therapy: Non-Curative / Palliative Intent, Discussed with Patient

## 2024-05-30 NOTE — Progress Notes (Signed)
 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  INTERVAL HISTORY: Patient returns to clinic today for hospital follow-up and discussion of her imaging and pathology results.  She continues to have back pain, but otherwise feels well.  She has no neurologic complaints.  She denies any recent fevers.  She has a good appetite and denies weight loss.  She has no chest pain, shortness of breath, cough, or hemoptysis.  She denies any nausea, vomiting, constipation, or diarrhea.  She has no urinary complaints.  Patient offers no further specific complaints today.  REVIEW OF SYSTEMS:   Review of Systems  Constitutional: Negative.  Negative for fever, malaise/fatigue and weight loss.  Respiratory: Negative.  Negative for cough, hemoptysis and shortness of breath.   Cardiovascular: Negative.  Negative for chest pain and leg swelling.  Gastrointestinal: Negative.  Negative for abdominal pain.  Genitourinary: Negative.  Negative for dysuria.  Musculoskeletal:  Positive for back pain.  Skin: Negative.  Negative for rash.  Neurological: Negative.  Negative for dizziness, focal weakness, weakness and headaches.  Psychiatric/Behavioral:  The patient is nervous/anxious.     As per HPI. Otherwise, a complete review of systems is negative.  PAST MEDICAL HISTORY: Past Medical History:  Diagnosis Date   Allergy    Anemia    Arthritis    Knees, hands   Basal cell carcinoma    back - removed   Bladder polyp    With previos hematuria   Diverticulitis    Dyspnea    Dyspnea on exertion    GERD (gastroesophageal reflux  disease)    History of ETT    Myoview 7/10 EF 78%, no ischemia or infarction. Poor exercise tolerance (2'30, stopped due to fatigue). Reached 86% MPHR.   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 IVAR Mori   BACK SURGERY     BASAL CELL CARCINOMA EXCISION     back   BREAST BIOPSY Right 06/19/2017   Affirm Bx- Radial Scar   BREAST EXCISIONAL BIOPSY Right 1996   neg   BREAST LUMPECTOMY WITH NEEDLE LOCALIZATION Right 08/11/2017   Procedure: BREAST LUMPECTOMY WITH NEEDLE LOCALIZATION;  Surgeon: Claudene Larinda Bolder, MD;  Location: [facility];  Service: General;  Laterality: Right;   CARPAL TUNNEL RELEASE Right    CATARACT EXTRACTION W/ INTRAOCULAR LENS  IMPLANT, BILATERAL     EXCISION OF BREAST BIOPSY Right 08/11/2017   excision of radial Scar   EYE SURGERY     JOINT REPLACEMENT Right    knee   KNEE ARTHROSCOPY Right 04/22/2016   Procedure: ARTHROSCOPY KNEE;  Surgeon: Helayne Glenn, MD;  Location: [facility];  Service: Orthopedics;  Laterality: Right;   knee replacement Right 01/11/2017   NASAL SINUS SURGERY  06/05/14   Dr. Milissa   POSTERIOR CERVICAL LAMINECTOMY  2005   C5-7; plate and screws   RENAL ANGIOGRAPHY Left 04/20/2020   Procedure: RENAL ANGIOGRAPHY;  Surgeon: Marea Selinda RAMAN, MD;  Location: [facility];  Service: Cardiovascular;  Laterality: Left;   TONSILLECTOMY     VIDEO BRONCHOSCOPY WITH ENDOBRONCHIAL NAVIGATION N/A 05/24/2024   Procedure: VIDEO BRONCHOSCOPY WITH ENDOBRONCHIAL NAVIGATION;  Surgeon: Parris Manna, MD;  Location: [facility];   Service: Thoracic;  Laterality: N/A;   VIDEO BRONCHOSCOPY WITH ENDOBRONCHIAL ULTRASOUND N/A 05/24/2024   Procedure: BRONCHOSCOPY, WITH EBUS;  Surgeon: Parris Manna, MD;  Location: [facility];  Service: Thoracic;  Laterality: N/A;    FAMILY HISTORY: Family History  Problem Relation Age of Onset   Ovarian cancer Mother    Cancer Mother    Stroke Father        CVA   Breast cancer Daughter 25   Breast cancer Maternal Aunt 60   Breast cancer Maternal Aunt 90   Prostate cancer Neg Hx    Bladder Cancer Neg Hx    Kidney cancer Neg Hx     ADVANCED DIRECTIVES (Y/N):  N  HEALTH MAINTENANCE: Social History   Tobacco Use   Smoking status: Never   Smokeless tobacco: Never  Vaping Use   Vaping status: Never Used  Substance Use Topics   Alcohol use: No   Drug use: No     Colonoscopy:  PAP:  Bone density:  Lipid panel:  Allergies  Allergen Reactions   Chlorhexidine Hives    SAGE wipes caused severe rash   Cyclobenzaprine Hcl     Doesn't remember reaction   Etodolac     Doesn't remember reaction   Lisinopril     Coughing up blood   Meloxicam     Doesn't remember reaction   Metaxalone     Doesn't remember reaction   Neomycin-Bacitracin Zn-Polymyx     Skin irritation   Nifedipine Swelling   Nitrofurantoin     Doesn't remember reaction   Sulfamethoxazole-Trimethoprim Hives   Sulfonamide Derivatives Hives   Tizanidine     Liver problems   Vasotec [Enalapril Maleate]     Doesn't remember reaction   Ibuprofen Rash   Tape Rash    Current Outpatient Medications  Medication Sig Dispense Refill   Acetaminophen  (TYLENOL  ARTHRITIS PAIN PO) Take by mouth.     atorvastatin  (LIPITOR) 10 MG tablet Take 1 tablet (10 mg total) by mouth daily. 30 tablet 11   bisoprolol -hydrochlorothiazide  (ZIAC ) 10-6.25 MG per tablet Take 1 tablet by mouth daily.     cetirizine (ZYRTEC) 10 MG tablet Take 10 mg by mouth as needed.     Cholecalciferol  (VITAMIN D ) 50 MCG (2000 UT) tablet Take 4,000  Units by mouth daily.      clopidogrel  (PLAVIX ) 75 MG tablet Take 75 mg by mouth daily.     Cranberry 500 MG CAPS Take 1 capsule by mouth daily.     estradiol  (ESTRACE  VAGINAL) 0.1 MG/GM vaginal cream Apply 0.5mg  (pea-sized amount)  just inside the vaginal introitus with a finger-tip on Monday, Wednesday and Friday nights. 30 g 12   fluticasone  (FLONASE ) 50 MCG/ACT nasal spray Place 2 sprays into both nostrils as needed for allergies or rhinitis.     furosemide  (LASIX ) 20 MG tablet Take 20 mg by mouth daily.     hydrALAZINE  (APRESOLINE ) 50 MG tablet Take 50 mg  by mouth 3 (three) times daily.      losartan  (COZAAR ) 100 MG tablet Take 50 mg by mouth daily.     methocarbamol  (ROBAXIN ) 500 MG tablet Take 1 tablet (500 mg total) by mouth every 6 (six) hours as needed for muscle spasms. 30 tablet 0   Multiple Vitamin (MULTIVITAMIN) capsule Take 2 capsules by mouth daily. [employer] MVI     rOPINIRole  (REQUIP ) 0.25 MG tablet Take 0.5 mg by mouth 3 (three) times daily.     oxyCODONE -acetaminophen  (PERCOCET/ROXICET) 5-325 MG tablet Take 1 tablet by mouth every 4 (four) hours as needed for moderate pain (pain score 4-6). 60 tablet 0   No current facility-administered medications for this visit.    OBJECTIVE: Vitals:   05/30/24 1327  BP: (!) 145/60  Pulse: 65  Resp: 16  Temp: (!) 97.5 F (36.4 C)  SpO2: 95%     Body mass index is 34.04 kg/m.    ECOG FS:0 - Asymptomatic  General: Well-developed, well-nourished, no acute distress. Eyes: Pink conjunctiva, anicteric sclera. HEENT: Normocephalic, moist mucous membranes. Lungs: No audible wheezing or coughing. Heart: Regular rate and rhythm. Abdomen: Soft, nontender, no obvious distention. Musculoskeletal: No edema, cyanosis, or clubbing. Neuro: Alert, answering all questions appropriately. Cranial nerves grossly intact. Skin: No rashes or petechiae noted. Psych: Normal affect. Lymphatics: No cervical, calvicular, axillary or inguinal LAD.   LAB  RESULTS:  Lab Results  Component Value Date   NA 137 05/11/2024   K 3.7 05/11/2024   CL 104 05/11/2024   CO2 22 05/11/2024   GLUCOSE 132 (H) 05/11/2024   BUN 25 (H) 05/11/2024   CREATININE 1.20 (H) 05/11/2024   CALCIUM  8.7 (L) 05/11/2024   PROT 6.8 05/10/2024   ALBUMIN 3.8 05/10/2024   AST 28 05/10/2024   ALT 22 05/10/2024   ALKPHOS 77 05/10/2024   BILITOT 1.0 05/10/2024   GFRNONAA 44 (L) 05/11/2024   GFRAA 60 (L) 04/20/2020    Lab Results  Component Value Date   WBC 6.4 05/14/2024   NEUTROABS 7.7 05/10/2024   HGB 10.5 (L) 05/14/2024   HCT 32.0 (L) 05/14/2024   MCV 90.1 05/14/2024   PLT 120 (L) 05/14/2024     STUDIES: DG Chest Port 1 View Result Date: 05/24/2024 CLINICAL DATA:  [id number] S/P bronchoscopy [id number] EXAM: PORTABLE CHEST - 1 VIEW COMPARISON:  May 21, 2024, May 10, 2024 FINDINGS: Lower lung volumes. Bilateral perihilar interstitial opacities. Blunting of the right costophrenic sulcus. No pneumothorax. The lung nodules on the prior CT are not well evaluated by plain radiography. No cardiomegaly. Aortic atherosclerosis. No acute fracture or destructive lesions. Multilevel thoracic osteophytosis. Cervical fusion hardware. IMPRESSION: 1. Low lung volumes with bilateral perihilar interstitial opacities, which may represent bronchovascular crowding or interstitial edema. Trace right pleural effusion. No pneumothorax. 2. The bilateral pulmonary nodules on the prior CT are not well evaluated by plain radiography. Electronically Signed   By: Rogelia Myers M.D.   On: 05/24/2024 16:14   DG C-Arm 1-60 Min-No Report Result Date: 05/24/2024 Fluoroscopy was utilized by the requesting physician.  No radiographic interpretation.   DG C-Arm 1-60 Min-No Report Result Date: 05/24/2024 Fluoroscopy was utilized by the requesting physician.  No radiographic interpretation.   NM PET Image Initial (PI) Skull Base To Thigh Addendum Date: 05/23/2024 ADDENDUM REPORT: 05/23/2024 12:28  ADDENDUM: Additional impression. On the very first image is a slight area of asymmetric uptake along the right frontal region of intracranial compartment. This could be technical. In light of the  other findings however if there is further concern additional MRI may be useful to exclude underlying lesion. Electronically Signed   By: Ranell Bring M.D.   On: 05/23/2024 12:28   Result Date: 05/23/2024 CLINICAL DATA:  Initial treatment strategy for multiple lung nodules. EXAM: NUCLEAR MEDICINE PET SKULL BASE TO THIGH TECHNIQUE: 10.15 mCi F-18 FDG was injected intravenously. Full-ring PET imaging was performed from the skull base to thigh after the radiotracer. CT data was obtained and used for attenuation correction and anatomic localization. Fasting blood glucose: 133 mg/dl COMPARISON:  CTA chest 05/10/2024 FINDINGS: Mediastinal blood pool activity: SUV max 3.1 Liver activity: SUV max 3.9 NECK: No specific abnormal uptake seen in the neck including along lymph node chains of the submandibular, posterior triangle or internal jugular regions. Slight asymmetric area of uptake along the right frontal region, this could be a physiologic variant. Incidental CT findings: Nasal septal deviation identified. Small rounded opacity along the right maxillary sinus, small mucous retention cyst or polyp. Visualized portions of the mastoid air cells clear. Streak artifact related to the patient's dental hardware and cervical spine fixation hardware. Small thyroid gland. The submandibular glands and parotid glands are preserved. CHEST: As seen on the recent CT angiogram of the chest from 05/10/2024 there are numerous scattered bilateral lung nodules identified. Many of which show abnormal uptake. There is some limitation today with motion. Lesion seen in the left lower lobe on the prior examination which would have measured 15 by 12 mm, today is seen on image 54 and has some uptake of maximum SUV 4.2. Lesion seen in the anterior right  middle lobe maximum SUV of 6.6 is identified on image 47, measuring 12 mm. Several lesions identified do not show significant abnormal uptake. Some of this could relate to also small lesions are versus motion. There are several hypermetabolic lymph nodes identified in the right axillary region. Maximum SUV value of 8.5. Is also mediastinal nodes including a precarinal lesion which has maximum SUV value 6.1 and measures on image 40 in short axis of 10 mm. Similar size to the recent prior. Is also mild uptake along the left hilum. No abnormal uptake along the axillary regions. Incidental CT findings: Persistent small right pleural effusion. There is slight increase in tiny left pleural effusion. Moderate hiatal hernia. Scattered vascular calcifications are identified. No pericardial effusion. ABDOMEN/PELVIS: There are 3 hypermetabolic lesions identified. Example in segment 4A on image 57 has maximum SUV value of 8.2. Focus seen towards the caudate lobe on image 66 has maximum SUV value of 9.2. There lesion is seen in segment 3 with maximum SUV value 10.3 on image 74. These lesions are not well seen on the CT scan. Otherwise there is physiologic distribution radiotracer along the parenchymal organs, bowel and renal collecting systems. No abnormal nodal uptake identified in the abdomen and pelvis. Incidental CT findings: Significant motion. Benign separate hepatic cystic lesions are identified. Gallbladder is nondilated. Grossly the spleen, adrenal glands and pancreas are unremarkable. Mild bilateral renal atrophy. Calcifications along the right kidney. Contracted urinary bladder. Large bowel has a normal course and caliber with scattered colonic stool and colonic diverticula. Small bowel is nondilated. Note is made of a left renal artery stent. SKELETON: Multifocal areas of abnormal bony uptake consistent bone metastases. These include the left femoral neck, the left ischial bone, bilateral iliac bones, bilateral ribs  and the left humeral head. Example left humeral head lesion has sclerosis on image 21 and maximum SUV value of 11.0. Left  superior iliac crest lesion also sclerosis on CT image 95 and maximum SUV of 6.6. Left femoral neck lesion again with sclerosis on image 133 has maximum SUV of 9.2. of note there is also a a photopenic appearance to the T8 vertebral body with some surrounding mild increased uptake. Please correlate with spine CT scan of 05/10/2024. Incidental CT findings: Curvature of the spine. Diffuse degenerative changes identified. IMPRESSION: Multifocal hypermetabolic metastatic disease. Several lesions scattered throughout the skeleton. Multiple lung nodules are identified as on prior examination. Some of these do not show significant uptake while others show clear abnormal uptake. There is significant breathing motion. Abnormal mediastinal and right hilar lymph nodes. At least 3 hypermetabolic liver lesions. If needed additional workup with liver MRI as clinically appropriate. Electronically Signed: By: Ranell Bring M.D. On: 05/23/2024 11:48   CT Angio Chest/Abd/Pel for Dissection W and/or Wo Contrast Result Date: 05/10/2024 CLINICAL DATA:  Acute right back and shoulder pain at 2 a.m. today. Clinical concern for aortic dissection. EXAM: CT ANGIOGRAPHY CHEST, ABDOMEN AND PELVIS TECHNIQUE: Multidetector CT imaging through the chest, abdomen and pelvis was performed using the standard protocol during bolus administration of intravenous contrast. Multiplanar reconstructed images and MIPs were obtained and reviewed to evaluate the vascular anatomy. RADIATION DOSE REDUCTION: This exam was performed according to the departmental dose-optimization program which includes automated exposure control, adjustment of the mA and/or kV according to patient size and/or use of iterative reconstruction technique. CONTRAST:  75mL OMNIPAQUE  IOHEXOL  350 MG/ML SOLN COMPARISON:  Chest CT dated 09/09/2013. Chest radiographs  dated 03/09/2023 and 12/25/2020 and chest CTA dated 09/08/2013. FINDINGS: CTA CHEST FINDINGS Cardiovascular: Mild atheromatous calcifications including the thoracic aorta. No aortic aneurysm or dissection. Normally opacified pulmonary arteries with no pulmonary arterial filling defects. Mildly enlarged heart with left atrial and left ventricular dilatation. No pericardial effusion. Mediastinum/Nodes: Moderate to large-sized hiatal hernia is again demonstrated. Unremarkable thyroid gland and trachea. Mildly enlarged precarinal node with a short axis diameter of 10 mm on image number 55/6, previously 8 mm. Mildly enlarged right hilar nodes, the largest with a short axis diameter of 11 mm on image number 60/6. Mildly enlarged subcarinal node with a short axis diameter of 10 mm on image number 63/6. Lungs/Pleura: Multiple interval bilateral lung nodules involving all lobes of both lungs. The largest on the right measures 1.6 x 1.3 cm on image number 79/7 in the largest on the left is in the left lower lobe, measuring 1.7 x 1.1 cm on image number 86/7. One nodule is more spiculated than the others. This is in the right lower lobe, measuring 1.7 x 1.1 cm on image number 93/7. Interval small right pleural effusion. The previously demonstrated ground-glass opacity on the right has resolved. Musculoskeletal: Thoracic spine degenerative changes and cervical spine fixation hardware. No evidence of bony metastatic disease. Review of the MIP images confirms the above findings. CTA ABDOMEN AND PELVIS FINDINGS VASCULAR Aorta: Mild atheromatous calcifications.  No aneurysm or dissection. Celiac: Patent without evidence of aneurysm, dissection, vasculitis or significant stenosis. SMA: Patent without evidence of aneurysm, dissection, vasculitis or significant stenosis. Renals: Proximal left renal artery stent. This is patent. Normal-appearing right renal artery. IMA: Patent without evidence of aneurysm, dissection, vasculitis or  significant stenosis. Inflow: Patent without evidence of aneurysm, dissection, vasculitis or significant stenosis. Veins: No obvious venous abnormality within the limitations of this arterial phase study. Review of the MIP images confirms the above findings. NON-VASCULAR Hepatobiliary: No significant change in previously demonstrated liver cysts.  No evidence of liver metastatic disease. Normal-appearing gallbladder. Pancreas: Unremarkable. No pancreatic ductal dilatation or surrounding inflammatory changes. Spleen: Normal in size without focal abnormality. Adrenals/Urinary Tract: 1.4 x 1.0 cm low to medium density left adrenal nodule on image number 58/5 is smaller than on 09/09/2013, measuring 1.8 x 1.3 cm at that time. This currently measures 20 Hounsfield units in density and previously measured 18 Hounsfield units in density. Normal-appearing right adrenal gland. 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 This does not need imaging follow-up. Two adjacent 4 mm calculi in the mid right kidney. Normal-appearing ureters. Small left bladder diverticulum. No hydronephrosis. Stomach/Bowel: Multiple diverticula throughout the colon, most numerous in the sigmoid colon. No evidence of diverticulitis. Stable moderately large hiatal hernia. Unremarkable appendix. Lymphatic: No enlarged lymph nodes. Reproductive: Status post hysterectomy. No adnexal masses. Other: Moderate-sized bilateral inguinal hernias containing fat. Musculoskeletal: Mild lumbar spine degenerative changes. No evidence of bony metastatic disease. Review of the MIP images confirms the above findings. IMPRESSION: 1. No aortic aneurysm or dissection. 2. Multiple interval bilateral lung nodules, compatible with metastatic disease with a possible right lower lobe primary. A multinodular infectious process is less likely. 3. Mild mediastinal and right hilar lymphadenopathy, compatible with metastatic disease.  Reactive adenopathy is less likely. 4. Interval small right pleural effusion. 5. Mild cardiomegaly with left atrial and left ventricular dilatation. 6. Moderate to large-sized hiatal hernia. 7. Colonic diverticulosis. 8. Nonobstructing right renal calculi. 9. Moderate-sized bilateral inguinal hernias containing fat. 10. 1.4 x 1.0 cm low to medium density left adrenal nodule, smaller than on 09/09/2013, compatible with a benign adenoma not needing imaging follow-up. 11. Aortic atherosclerosis. Aortic Atherosclerosis (ICD10-I70.0). Electronically Signed   By: Elspeth Bathe M.D.   On: 05/10/2024 13:30   CT Thoracic Spine Wo Contrast Result Date: 05/10/2024 CLINICAL DATA:  Acute right back to shoulder pain. EXAM: CT THORACIC SPINE WITHOUT CONTRAST TECHNIQUE: Multidetector CT images of the thoracic were obtained using the standard protocol without intravenous contrast. RADIATION DOSE REDUCTION: This exam was performed according to the departmental dose-optimization program which includes automated exposure control, adjustment of the mA and/or kV according to patient size and/or use of iterative reconstruction technique. COMPARISON:  None Available. FINDINGS: Alignment: Mild-to-moderate kyphosis. Vertebrae: Vertebrae are intact and appear normally mineralized. No lesions are evident. Patient is status post interbody fusion and anterior spinal fixation at C5-6 and C6-7. Paraspinal and other soft tissues: The immediate paraspinous soft tissues are unremarkable. There are pulmonary nodules present and bilateral pleural effusions, worse on the right. There is also moderate hiatus hernia. Disc levels: There is mild disc space narrowing present throughout the thoracic spine. There is no apparent disc herniation or significant spinal canal or neural foraminal stenosis. There is no apparent acute process. IMPRESSION: 1. Mild diffuse degenerative disc disease throughout the thoracic spine without significant spinal canal or  neural foraminal stenosis. 2. Numerous pulmonary nodules and bilateral pleural effusions, which will be more definitively assessed in the accompanying CT of the chest. Electronically Signed   By: Evalene Coho M.D.   On: 05/10/2024 12:34   CT Lumbar Spine Wo Contrast Result Date: 05/10/2024 CLINICAL DATA:  Acute lower back pain. EXAM: CT LUMBAR SPINE WITHOUT CONTRAST TECHNIQUE: Multidetector CT imaging of the lumbar spine was performed without intravenous contrast administration. Multiplanar CT image reconstructions were also generated. RADIATION DOSE REDUCTION: This exam was performed according to the departmental dose-optimization program which includes automated exposure control, adjustment of the mA and/or  kV according to patient size and/or use of iterative reconstruction technique. COMPARISON:  None Available. FINDINGS: Segmentation: The L1 vertebral body has hypoplastic ribs. Alignment: Slight anterolisthesis at L5-S1. Mild dextrocurvature of the thoracolumbar junction. Vertebrae: Vertebral bodies are intact and maintain their height. No lesions are evident. Paraspinal and other soft tissues: There is no paravertebral soft tissue swelling or hematoma evident. There are nonobstructive calculi present within the right kidney. There is also moderate right renal cortical atrophy and scarring. A fat density lesion is present in the superior pole of the left kidney. There is a left renal artery stent. There are numerous sigmoid diverticula present. Disc levels: L1-2: Minimal disc bulging and mild facet hypertrophy. No significant spinal canal or neural foraminal stenosis. L2-3: Mild disc bulging and mild facet arthrosis. No significant spinal canal or neural foraminal stenosis. L3-4: Mild diffuse disc bulging and mild to moderate bilateral facet arthrosis, with mild-to-moderate central spinal canal stenosis and bilateral lateral recess stenosis. L4-5: Mild disc bulging and mild bilateral facet arthrosis.  Mild right lateral recess and neural foraminal stenosis. L5-S1: Slight degenerative anterolisthesis and severe bilateral facet arthrosis. No significant spinal canal or neural foraminal stenosis. IMPRESSION: 1. Mild diffuse degenerative disc disease and multilevel facet arthrosis, which is most severe at L5-S1. 2. Right nephrolithiasis. 3. Colonic diverticulosis. Electronically Signed   By: Evalene Coho M.D.   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  PET scan results from May 21, 2024 confirmed stage of disease with bilateral lung, liver, and multiple bony metastasis.  Will get a brain MRI to complete metastatic workup.  NGS testing has been ordered and is pending at time of dictation.  Patient states that she will not take chemotherapy, but may consider directed therapy if NGS or PD-L1 is positive.  We also briefly discussed hospice, but patient is not ready to enroll at this time.  No further interventions needed.  Return to clinic in 2 to 3 weeks after NGS testing results for further evaluation and treatment planning if possible. Pain: Unrelated to malignancy.  MRI on May 10, 2024 revealed diffuse degenerative disc disease most severe at L5-S1.  Continue Robaxin  and Percocet as needed.  I spent a total of 30 minutes reviewing chart data, face-to-face evaluation with the patient, counseling and coordination of care as detailed above.   Patient expressed understanding and was in agreement with this plan. She also understands that She can call clinic at any time with any questions, concerns, or complaints.    Cancer Staging  No matching staging information was found for the patient.   Evalene JINNY Reusing, MD   05/30/2024 3:32  PM

## 2024-05-30 NOTE — Progress Notes (Signed)
 Order placed for [employer]us xT, xR, and PDL1 on recent lung biopsy sample [id number]. Started application for financial assistance but pt will need to call back with her income information before submitting. Will submit once all information received.

## 2024-05-30 NOTE — Progress Notes (Signed)
 Met with patient and her daughter during hospital follow up visit with Dr. Jacobo. All questions answered during visit. Reviewed upcoming appts. Pt given resources regarding diagnosis and supportive services available. Contact info given and instructed to call with any questions or needs. Pt and daughter verbalized understanding.

## 2024-06-04 ENCOUNTER — Encounter: Payer: Self-pay | Admitting: *Deleted

## 2024-06-04 NOTE — Progress Notes (Signed)
 Financial assistance application completed and approved for $0 out of pocket cost.

## 2024-06-10 ENCOUNTER — Inpatient Hospital Stay
Admission: EM | Admit: 2024-06-10 | Discharge: 2024-06-19 | DRG: 163 | Disposition: A | Discharge status: Skilled Nursing Facility | Attending: Internal Medicine | Admitting: Internal Medicine

## 2024-06-10 ENCOUNTER — Emergency Department

## 2024-06-10 DIAGNOSIS — Z79891 Long term (current) use of opiate analgesic: Secondary | ICD-10-CM

## 2024-06-10 DIAGNOSIS — M1611 Unilateral primary osteoarthritis, right hip: Secondary | ICD-10-CM | POA: Diagnosis not present

## 2024-06-10 DIAGNOSIS — I959 Hypotension, unspecified: Secondary | ICD-10-CM | POA: Diagnosis not present

## 2024-06-10 DIAGNOSIS — G2581 Restless legs syndrome: Secondary | ICD-10-CM | POA: Diagnosis present

## 2024-06-10 DIAGNOSIS — E785 Hyperlipidemia, unspecified: Secondary | ICD-10-CM | POA: Diagnosis present

## 2024-06-10 DIAGNOSIS — Z96651 Presence of right artificial knee joint: Secondary | ICD-10-CM | POA: Diagnosis present

## 2024-06-10 DIAGNOSIS — R0602 Shortness of breath: Secondary | ICD-10-CM | POA: Diagnosis not present

## 2024-06-10 DIAGNOSIS — Z79899 Other long term (current) drug therapy: Secondary | ICD-10-CM

## 2024-06-10 DIAGNOSIS — J984 Other disorders of lung: Secondary | ICD-10-CM | POA: Diagnosis not present

## 2024-06-10 DIAGNOSIS — I50810 Right heart failure, unspecified: Secondary | ICD-10-CM | POA: Diagnosis not present

## 2024-06-10 DIAGNOSIS — R0989 Other specified symptoms and signs involving the circulatory and respiratory systems: Secondary | ICD-10-CM | POA: Diagnosis not present

## 2024-06-10 DIAGNOSIS — N1831 Chronic kidney disease, stage 3a: Secondary | ICD-10-CM | POA: Diagnosis present

## 2024-06-10 DIAGNOSIS — M545 Low back pain, unspecified: Secondary | ICD-10-CM | POA: Diagnosis present

## 2024-06-10 DIAGNOSIS — K219 Gastro-esophageal reflux disease without esophagitis: Secondary | ICD-10-CM | POA: Diagnosis present

## 2024-06-10 DIAGNOSIS — C3490 Malignant neoplasm of unspecified part of unspecified bronchus or lung: Secondary | ICD-10-CM | POA: Diagnosis present

## 2024-06-10 DIAGNOSIS — I10 Essential (primary) hypertension: Secondary | ICD-10-CM | POA: Diagnosis present

## 2024-06-10 DIAGNOSIS — I2699 Other pulmonary embolism without acute cor pulmonale: Secondary | ICD-10-CM

## 2024-06-10 DIAGNOSIS — I2510 Atherosclerotic heart disease of native coronary artery without angina pectoris: Secondary | ICD-10-CM | POA: Diagnosis present

## 2024-06-10 DIAGNOSIS — Z86008 Personal history of in-situ neoplasm of other site: Secondary | ICD-10-CM

## 2024-06-10 DIAGNOSIS — J9601 Acute respiratory failure with hypoxia: Secondary | ICD-10-CM | POA: Diagnosis present

## 2024-06-10 DIAGNOSIS — Z7982 Long term (current) use of aspirin: Secondary | ICD-10-CM

## 2024-06-10 DIAGNOSIS — Z90710 Acquired absence of both cervix and uterus: Secondary | ICD-10-CM

## 2024-06-10 DIAGNOSIS — G8929 Other chronic pain: Secondary | ICD-10-CM | POA: Diagnosis present

## 2024-06-10 DIAGNOSIS — R5381 Other malaise: Secondary | ICD-10-CM | POA: Diagnosis present

## 2024-06-10 DIAGNOSIS — Z66 Do not resuscitate: Secondary | ICD-10-CM | POA: Diagnosis present

## 2024-06-10 DIAGNOSIS — K7689 Other specified diseases of liver: Secondary | ICD-10-CM | POA: Diagnosis not present

## 2024-06-10 DIAGNOSIS — R079 Chest pain, unspecified: Secondary | ICD-10-CM | POA: Diagnosis not present

## 2024-06-10 DIAGNOSIS — I2602 Saddle embolus of pulmonary artery with acute cor pulmonale: Principal | ICD-10-CM | POA: Diagnosis present

## 2024-06-10 DIAGNOSIS — I255 Ischemic cardiomyopathy: Secondary | ICD-10-CM | POA: Diagnosis present

## 2024-06-10 DIAGNOSIS — Z6834 Body mass index (BMI) 34.0-34.9, adult: Secondary | ICD-10-CM

## 2024-06-10 DIAGNOSIS — Z515 Encounter for palliative care: Secondary | ICD-10-CM

## 2024-06-10 DIAGNOSIS — I50811 Acute right heart failure: Secondary | ICD-10-CM | POA: Diagnosis present

## 2024-06-10 DIAGNOSIS — Z85828 Personal history of other malignant neoplasm of skin: Secondary | ICD-10-CM

## 2024-06-10 DIAGNOSIS — I2609 Other pulmonary embolism with acute cor pulmonale: Secondary | ICD-10-CM | POA: Diagnosis not present

## 2024-06-10 DIAGNOSIS — Z7901 Long term (current) use of anticoagulants: Secondary | ICD-10-CM

## 2024-06-10 DIAGNOSIS — I2692 Saddle embolus of pulmonary artery without acute cor pulmonale: Secondary | ICD-10-CM | POA: Diagnosis present

## 2024-06-10 DIAGNOSIS — Z4789 Encounter for other orthopedic aftercare: Secondary | ICD-10-CM | POA: Diagnosis not present

## 2024-06-10 DIAGNOSIS — I82411 Acute embolism and thrombosis of right femoral vein: Secondary | ICD-10-CM | POA: Diagnosis not present

## 2024-06-10 DIAGNOSIS — R0789 Other chest pain: Secondary | ICD-10-CM | POA: Diagnosis not present

## 2024-06-10 DIAGNOSIS — F0390 Unspecified dementia without behavioral disturbance: Secondary | ICD-10-CM | POA: Insufficient documentation

## 2024-06-10 DIAGNOSIS — I129 Hypertensive chronic kidney disease with stage 1 through stage 4 chronic kidney disease, or unspecified chronic kidney disease: Secondary | ICD-10-CM | POA: Diagnosis present

## 2024-06-10 DIAGNOSIS — R001 Bradycardia, unspecified: Secondary | ICD-10-CM | POA: Diagnosis not present

## 2024-06-10 DIAGNOSIS — Z7902 Long term (current) use of antithrombotics/antiplatelets: Secondary | ICD-10-CM

## 2024-06-10 DIAGNOSIS — I700 Atherosclerosis of aorta: Secondary | ICD-10-CM | POA: Diagnosis present

## 2024-06-10 DIAGNOSIS — Z886 Allergy status to analgesic agent status: Secondary | ICD-10-CM

## 2024-06-10 DIAGNOSIS — J90 Pleural effusion, not elsewhere classified: Secondary | ICD-10-CM | POA: Diagnosis not present

## 2024-06-10 DIAGNOSIS — R059 Cough, unspecified: Secondary | ICD-10-CM | POA: Diagnosis present

## 2024-06-10 DIAGNOSIS — I272 Pulmonary hypertension, unspecified: Secondary | ICD-10-CM | POA: Diagnosis present

## 2024-06-10 DIAGNOSIS — F0280 Dementia in other diseases classified elsewhere without behavioral disturbance: Secondary | ICD-10-CM | POA: Diagnosis present

## 2024-06-10 DIAGNOSIS — Z823 Family history of stroke: Secondary | ICD-10-CM

## 2024-06-10 DIAGNOSIS — R918 Other nonspecific abnormal finding of lung field: Secondary | ICD-10-CM | POA: Diagnosis not present

## 2024-06-10 DIAGNOSIS — G309 Alzheimer's disease, unspecified: Secondary | ICD-10-CM | POA: Diagnosis present

## 2024-06-10 DIAGNOSIS — E66812 Obesity, class 2: Secondary | ICD-10-CM | POA: Diagnosis present

## 2024-06-10 DIAGNOSIS — F0150 Vascular dementia without behavioral disturbance: Secondary | ICD-10-CM | POA: Diagnosis present

## 2024-06-10 DIAGNOSIS — I252 Old myocardial infarction: Secondary | ICD-10-CM

## 2024-06-10 DIAGNOSIS — R0902 Hypoxemia: Secondary | ICD-10-CM | POA: Diagnosis not present

## 2024-06-10 DIAGNOSIS — I701 Atherosclerosis of renal artery: Secondary | ICD-10-CM | POA: Diagnosis present

## 2024-06-10 LAB — CBC
HCT: 34.3 % — ABNORMAL LOW (ref 36.0–46.0)
Hemoglobin: 10.9 g/dL — ABNORMAL LOW (ref 12.0–15.0)
MCH: 29.6 pg (ref 26.0–34.0)
MCHC: 31.8 g/dL (ref 30.0–36.0)
MCV: 93.2 fL (ref 80.0–100.0)
Platelets: 171 K/uL (ref 150–400)
RBC: 3.68 MIL/uL — ABNORMAL LOW (ref 3.87–5.11)
RDW: 14.2 % (ref 11.5–15.5)
WBC: 11.1 K/uL — ABNORMAL HIGH (ref 4.0–10.5)
nRBC: 0 % (ref 0.0–0.2)

## 2024-06-10 LAB — BASIC METABOLIC PANEL WITH GFR
Anion gap: 13 (ref 5–15)
BUN: 34 mg/dL — ABNORMAL HIGH (ref 8–23)
CO2: 25 mmol/L (ref 22–32)
Calcium: 9.2 mg/dL (ref 8.9–10.3)
Chloride: 101 mmol/L (ref 98–111)
Creatinine, Ser: 1.48 mg/dL — ABNORMAL HIGH (ref 0.44–1.00)
GFR, Estimated: 34 mL/min — ABNORMAL LOW (ref >60)
Glucose, Bld: 160 mg/dL — ABNORMAL HIGH (ref 70–99)
Potassium: 4.1 mmol/L (ref 3.5–5.1)
Sodium: 139 mmol/L (ref 135–145)

## 2024-06-10 LAB — PROTIME-INR
INR: 1 (ref 0.8–1.2)
Prothrombin Time: 14.1 s (ref 11.4–15.2)

## 2024-06-10 LAB — BLOOD GAS, VENOUS
Acid-base deficit: 0.9 mmol/L (ref 0.0–2.0)
Bicarbonate: 25.8 mmol/L (ref 20.0–28.0)
O2 Saturation: 51.6 %
Patient temperature: 37
pCO2, Ven: 50 mmHg (ref 44–60)
pH, Ven: 7.32 (ref 7.25–7.43)
pO2, Ven: 32 mmHg (ref 32–45)

## 2024-06-10 LAB — LACTIC ACID, PLASMA: Lactic Acid, Venous: 3.1 mmol/L (ref 0.5–1.9)

## 2024-06-10 LAB — BRAIN NATRIURETIC PEPTIDE: B Natriuretic Peptide: 195.9 pg/mL — ABNORMAL HIGH (ref 0.0–100.0)

## 2024-06-10 MED ORDER — SODIUM CHLORIDE 0.9% FLUSH: 3.0000 mL | Freq: Once | INTRAVENOUS | Status: DC

## 2024-06-10 MED ORDER — SODIUM CHLORIDE 0.9 % IV BOLUS: 1000.0000 mL | Freq: Once | INTRAVENOUS | Status: DC

## 2024-06-10 NOTE — ED Notes (Signed)
 Pt reporting to ED d/t SOB and now endorsing L lower CP. Pt states that she has lung cancer and when she went to the bathroom this evening, she suddenly became SOB. Pt connected to cardiac monitor and EKG taken. Pt tachycardic, tachypneic, and currently on . Pt   Past Medical History:  Diagnosis Date   Allergy    Anemia    Arthritis    Knees, hands   Basal cell carcinoma    back - removed   Bladder polyp    With previos hematuria   Diverticulitis    Dyspnea    Dyspnea on exertion    GERD (gastroesophageal reflux disease)    History of ETT    Myoview 7/10 EF 78%, no ischemia or infarction. Poor exercise tolerance (2'30, stopped due to fatigue). Reached 86% MPHR.   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

## 2024-06-10 NOTE — ED Provider Notes (Signed)
 [facility] Provider Note    Event Date/Time   First MD Initiated Contact with Patient 06/10/24 2305     (approximate)  History   Chief Complaint: Dyspnea  HPI  Julie Mcbride is a 88 y.o. female with a past medical history of anemia, gastric reflux, hypertension, prior MI, presents to the emergency department for shortness of breath.  According to the patient she has stage IV lung cancer, currently being treated at the cancer center.  She states today she developed acute onset of chest pain/tightness along with trouble breathing.  Patient currently requiring 6 L of oxygen to maintain sats in the 90s, denies any baseline O2 requirement.  Patient states chest tightness but denies any chest pain.  In reviewing the patient's lab work it appears that she is on Plavix  as her only anticoagulant.  Physical Exam   Triage Vital Signs: ED Triage Vitals  Encounter Vitals Group     BP 06/10/24 2237 121/66     Girls Systolic BP Percentile --      Girls Diastolic BP Percentile --      Boys Systolic BP Percentile --      Boys Diastolic BP Percentile --      Pulse Rate 06/10/24 2237 (!) 109     Resp 06/10/24 2237 (!) 35     Temp 06/10/24 2237 97.7 F (36.5 C)     Temp Source 06/10/24 2237 Oral     SpO2 06/10/24 2237 95 %     Weight --      Height 06/10/24 2239 5' 5 (1.651 m)     Head Circumference --      Peak Flow --      Pain Score 06/10/24 2239 0     Pain Loc --      Pain Education --      Exclude from Growth Chart --     Most recent vital signs: Vitals:   06/10/24 2237  BP: 121/66  Pulse: (!) 109  Resp: (!) 35  Temp: 97.7 F (36.5 C)  SpO2: 95%    General: Awake, no distress.  CV:  Good peripheral perfusion.  Regular rate and rhythm  Resp:  Normal effort.  Equal breath sounds bilaterally.  Abd:  No distention.  Soft, nontender.  No rebound or guarding.  ED Results / Procedures / Treatments   EKG  EKG viewed and interpreted by myself  shows sinus tachycardia at 111 bpm with a widened QRS, normal axis, largely normal intervals.  Morphology most consistent with left bundle branch block.  RADIOLOGY  I have reviewed and interpreted chest x-ray images.  Patient appears to have opacities in the right lower lobe unclear if this is due to the patient's known oncologic process versus edema versus infection. Radiology has read the x-ray has scattered bilateral patchy opacities may represent edema or infection.  Small bilateral pleural effusions.   MEDICATIONS ORDERED IN ED: Medications  sodium chloride  flush (NS) 0.9 % injection 3 mL (3 mLs Intravenous Not Given 06/10/24 2311)  sodium chloride  0.9 % bolus 1,000 mL (has no administration in time range)     IMPRESSION / MDM / ASSESSMENT AND PLAN / ED COURSE  I reviewed the triage vital signs and the nursing notes.  Patient's presentation is most consistent with acute presentation with potential threat to life or bodily function.  Patient presents to the emergency department for chest tightness and shortness of breath that started earlier today.  Patient has known  lung cancer currently undergoing treatment at the cancer center.  Patient is lab work today shows an overall reassuring CBC.  Chemistry shows renal insufficiency but no significant change from her baseline.  Patient's BNP is slightly elevated at 195.  Patient's lactic has resulted elevated at 3.1, VBG is reassuring.  Patient's chest x-ray shows opacities which could be consistent with either edema or infection or possibly due to her oncologic process.  Given the patient's acute onset of chest pain and shortness of breath earlier today now with a new O2 requirement we will obtain a CTA of the chest to further evaluate.  Differential would include ACS, pulmonary edema/CHF, pneumonia, PE.  FINAL CLINICAL IMPRESSION(S) / ED DIAGNOSES   Dyspnea Hypoxia Chest pain  Rx / DC Orders   ***  Note:  This document was prepared  using Dragon voice recognition software and may include unintentional dictation errors.

## 2024-06-10 NOTE — ED Triage Notes (Signed)
 Pt arrived via EMS from home for SOB. Pt does have stage 4 lung cancer. Pt complains of chest heaviness, tightness and sleepless nights due to dyspnea. Pt is currently has tachypnea and tachycardic. States it's hard to breath air out  EMS 5mg  of albuterol   20g R AC

## 2024-06-11 ENCOUNTER — Encounter: Payer: Self-pay | Admitting: Family Medicine

## 2024-06-11 ENCOUNTER — Encounter: Payer: Self-pay | Admitting: *Deleted

## 2024-06-11 ENCOUNTER — Other Ambulatory Visit: Payer: Self-pay

## 2024-06-11 ENCOUNTER — Emergency Department

## 2024-06-11 ENCOUNTER — Encounter
Admission: EM | Disposition: A | Discharge status: Skilled Nursing Facility | Payer: Self-pay | Source: Home / Self Care | Attending: Obstetrics and Gynecology

## 2024-06-11 ENCOUNTER — Inpatient Hospital Stay (HOSPITAL_COMMUNITY)
Admit: 2024-06-11 | Discharge: 2024-06-11 | Disposition: A | Discharge status: Home / Self Care | Attending: Family Medicine | Admitting: Family Medicine

## 2024-06-11 ENCOUNTER — Inpatient Hospital Stay

## 2024-06-11 DIAGNOSIS — G2581 Restless legs syndrome: Secondary | ICD-10-CM | POA: Diagnosis not present

## 2024-06-11 DIAGNOSIS — M1990 Unspecified osteoarthritis, unspecified site: Secondary | ICD-10-CM | POA: Diagnosis not present

## 2024-06-11 DIAGNOSIS — Z66 Do not resuscitate: Secondary | ICD-10-CM | POA: Diagnosis not present

## 2024-06-11 DIAGNOSIS — F0280 Dementia in other diseases classified elsewhere without behavioral disturbance: Secondary | ICD-10-CM | POA: Diagnosis not present

## 2024-06-11 DIAGNOSIS — E785 Hyperlipidemia, unspecified: Secondary | ICD-10-CM | POA: Diagnosis not present

## 2024-06-11 DIAGNOSIS — M545 Low back pain, unspecified: Secondary | ICD-10-CM | POA: Diagnosis not present

## 2024-06-11 DIAGNOSIS — I2699 Other pulmonary embolism without acute cor pulmonale: Secondary | ICD-10-CM | POA: Diagnosis not present

## 2024-06-11 DIAGNOSIS — R262 Difficulty in walking, not elsewhere classified: Secondary | ICD-10-CM | POA: Diagnosis not present

## 2024-06-11 DIAGNOSIS — G309 Alzheimer's disease, unspecified: Secondary | ICD-10-CM | POA: Diagnosis not present

## 2024-06-11 DIAGNOSIS — Z741 Need for assistance with personal care: Secondary | ICD-10-CM | POA: Diagnosis not present

## 2024-06-11 DIAGNOSIS — I2510 Atherosclerotic heart disease of native coronary artery without angina pectoris: Secondary | ICD-10-CM | POA: Diagnosis not present

## 2024-06-11 DIAGNOSIS — I255 Ischemic cardiomyopathy: Secondary | ICD-10-CM | POA: Diagnosis not present

## 2024-06-11 DIAGNOSIS — I50811 Acute right heart failure: Secondary | ICD-10-CM | POA: Diagnosis not present

## 2024-06-11 DIAGNOSIS — I10 Essential (primary) hypertension: Secondary | ICD-10-CM

## 2024-06-11 DIAGNOSIS — Q048 Other specified congenital malformations of brain: Secondary | ICD-10-CM | POA: Diagnosis not present

## 2024-06-11 DIAGNOSIS — R41841 Cognitive communication deficit: Secondary | ICD-10-CM | POA: Diagnosis not present

## 2024-06-11 DIAGNOSIS — J9601 Acute respiratory failure with hypoxia: Secondary | ICD-10-CM | POA: Diagnosis not present

## 2024-06-11 DIAGNOSIS — R2689 Other abnormalities of gait and mobility: Secondary | ICD-10-CM | POA: Diagnosis not present

## 2024-06-11 DIAGNOSIS — I2692 Saddle embolus of pulmonary artery without acute cor pulmonale: Secondary | ICD-10-CM | POA: Diagnosis present

## 2024-06-11 DIAGNOSIS — F0150 Vascular dementia without behavioral disturbance: Secondary | ICD-10-CM | POA: Diagnosis not present

## 2024-06-11 DIAGNOSIS — R059 Cough, unspecified: Secondary | ICD-10-CM | POA: Diagnosis not present

## 2024-06-11 DIAGNOSIS — Z6834 Body mass index (BMI) 34.0-34.9, adult: Secondary | ICD-10-CM | POA: Diagnosis not present

## 2024-06-11 DIAGNOSIS — Z743 Need for continuous supervision: Secondary | ICD-10-CM | POA: Diagnosis not present

## 2024-06-11 DIAGNOSIS — J9600 Acute respiratory failure, unspecified whether with hypoxia or hypercapnia: Secondary | ICD-10-CM | POA: Diagnosis not present

## 2024-06-11 DIAGNOSIS — J309 Allergic rhinitis, unspecified: Secondary | ICD-10-CM | POA: Diagnosis not present

## 2024-06-11 DIAGNOSIS — I509 Heart failure, unspecified: Secondary | ICD-10-CM | POA: Diagnosis not present

## 2024-06-11 DIAGNOSIS — Z515 Encounter for palliative care: Secondary | ICD-10-CM | POA: Diagnosis not present

## 2024-06-11 DIAGNOSIS — I82411 Acute embolism and thrombosis of right femoral vein: Secondary | ICD-10-CM | POA: Diagnosis not present

## 2024-06-11 DIAGNOSIS — K7689 Other specified diseases of liver: Secondary | ICD-10-CM | POA: Diagnosis not present

## 2024-06-11 DIAGNOSIS — R0602 Shortness of breath: Secondary | ICD-10-CM | POA: Diagnosis not present

## 2024-06-11 DIAGNOSIS — I50810 Right heart failure, unspecified: Secondary | ICD-10-CM

## 2024-06-11 DIAGNOSIS — I129 Hypertensive chronic kidney disease with stage 1 through stage 4 chronic kidney disease, or unspecified chronic kidney disease: Secondary | ICD-10-CM | POA: Diagnosis not present

## 2024-06-11 DIAGNOSIS — I959 Hypotension, unspecified: Secondary | ICD-10-CM | POA: Diagnosis not present

## 2024-06-11 DIAGNOSIS — I2609 Other pulmonary embolism with acute cor pulmonale: Secondary | ICD-10-CM

## 2024-06-11 DIAGNOSIS — I272 Pulmonary hypertension, unspecified: Secondary | ICD-10-CM | POA: Diagnosis not present

## 2024-06-11 DIAGNOSIS — R5381 Other malaise: Secondary | ICD-10-CM | POA: Diagnosis not present

## 2024-06-11 DIAGNOSIS — E66812 Obesity, class 2: Secondary | ICD-10-CM | POA: Diagnosis not present

## 2024-06-11 DIAGNOSIS — I2602 Saddle embolus of pulmonary artery with acute cor pulmonale: Secondary | ICD-10-CM | POA: Diagnosis not present

## 2024-06-11 DIAGNOSIS — Z79818 Long term (current) use of other agents affecting estrogen receptors and estrogen levels: Secondary | ICD-10-CM | POA: Diagnosis not present

## 2024-06-11 DIAGNOSIS — M6281 Muscle weakness (generalized): Secondary | ICD-10-CM | POA: Diagnosis not present

## 2024-06-11 DIAGNOSIS — R079 Chest pain, unspecified: Secondary | ICD-10-CM | POA: Diagnosis not present

## 2024-06-11 DIAGNOSIS — I700 Atherosclerosis of aorta: Secondary | ICD-10-CM | POA: Diagnosis not present

## 2024-06-11 DIAGNOSIS — M62838 Other muscle spasm: Secondary | ICD-10-CM | POA: Diagnosis not present

## 2024-06-11 DIAGNOSIS — R1311 Dysphagia, oral phase: Secondary | ICD-10-CM | POA: Diagnosis not present

## 2024-06-11 DIAGNOSIS — G8929 Other chronic pain: Secondary | ICD-10-CM | POA: Diagnosis not present

## 2024-06-11 DIAGNOSIS — N1831 Chronic kidney disease, stage 3a: Secondary | ICD-10-CM | POA: Diagnosis not present

## 2024-06-11 DIAGNOSIS — C3490 Malignant neoplasm of unspecified part of unspecified bronchus or lung: Secondary | ICD-10-CM | POA: Diagnosis not present

## 2024-06-11 DIAGNOSIS — I701 Atherosclerosis of renal artery: Secondary | ICD-10-CM | POA: Diagnosis not present

## 2024-06-11 DIAGNOSIS — R9089 Other abnormal findings on diagnostic imaging of central nervous system: Secondary | ICD-10-CM | POA: Diagnosis not present

## 2024-06-11 HISTORY — PX: PULMONARY THROMBECTOMY: CATH118295

## 2024-06-11 LAB — ECHOCARDIOGRAM COMPLETE
AR max vel: 2.51 cm2
AV Area VTI: 2.53 cm2
AV Area mean vel: 2.2 cm2
AV Mean grad: 3 mmHg
AV Peak grad: 5.2 mmHg
Ao pk vel: 1.14 m/s
Area-P 1/2: 5.97 cm2
Height: 65 in
S' Lateral: 2.2 cm

## 2024-06-11 LAB — BASIC METABOLIC PANEL WITH GFR
Anion gap: 9 (ref 5–15)
BUN: 31 mg/dL — ABNORMAL HIGH (ref 8–23)
CO2: 26 mmol/L (ref 22–32)
Calcium: 9.1 mg/dL (ref 8.9–10.3)
Chloride: 101 mmol/L (ref 98–111)
Creatinine, Ser: 1.27 mg/dL — ABNORMAL HIGH (ref 0.44–1.00)
GFR, Estimated: 41 mL/min — ABNORMAL LOW (ref >60)
Glucose, Bld: 146 mg/dL — ABNORMAL HIGH (ref 70–99)
Potassium: 4.3 mmol/L (ref 3.5–5.1)
Sodium: 136 mmol/L (ref 135–145)

## 2024-06-11 LAB — CBC
HCT: 33.9 % — ABNORMAL LOW (ref 36.0–46.0)
Hemoglobin: 11.1 g/dL — ABNORMAL LOW (ref 12.0–15.0)
MCH: 30.2 pg (ref 26.0–34.0)
MCHC: 32.7 g/dL (ref 30.0–36.0)
MCV: 92.1 fL (ref 80.0–100.0)
Platelets: 153 K/uL (ref 150–400)
RBC: 3.68 MIL/uL — ABNORMAL LOW (ref 3.87–5.11)
RDW: 14 % (ref 11.5–15.5)
WBC: 10.7 K/uL — ABNORMAL HIGH (ref 4.0–10.5)
nRBC: 0 % (ref 0.0–0.2)

## 2024-06-11 LAB — MRSA NEXT GEN BY PCR, NASAL: MRSA by PCR Next Gen: NOT DETECTED

## 2024-06-11 LAB — HEPARIN LEVEL (UNFRACTIONATED): Heparin Unfractionated: 0.8 [IU]/mL — ABNORMAL HIGH (ref 0.30–0.70)

## 2024-06-11 LAB — LACTIC ACID, PLASMA: Lactic Acid, Venous: 1.4 mmol/L (ref 0.5–1.9)

## 2024-06-11 LAB — GLUCOSE, CAPILLARY: Glucose-Capillary: 108 mg/dL — ABNORMAL HIGH (ref 70–99)

## 2024-06-11 SURGERY — PULMONARY THROMBECTOMY
Anesthesia: Moderate Sedation | Laterality: Bilateral

## 2024-06-11 MED ORDER — OXYCODONE-ACETAMINOPHEN 5-325 MG PO TABS
1.0000 | ORAL_TABLET | ORAL | Status: DC | PRN
  Administered 2024-06-11 – 2024-06-14 (×8): 1 via ORAL
  Filled 2024-06-11 (×8): qty 1

## 2024-06-11 MED ORDER — CRANBERRY 500 MG PO CAPS: 1.0000 | ORAL_CAPSULE | Freq: Every day | ORAL | Status: DC

## 2024-06-11 MED ORDER — METHOCARBAMOL 500 MG PO TABS
500.0000 mg | ORAL_TABLET | Freq: Four times a day (QID) | ORAL | Status: DC | PRN
  Administered 2024-06-11 – 2024-06-19 (×10): 500 mg via ORAL
  Filled 2024-06-11 (×11): qty 1

## 2024-06-11 MED ORDER — VITAMIN D 25 MCG (1000 UNIT) PO TABS
4000.0000 [IU] | ORAL_TABLET | Freq: Every day | ORAL | Status: DC
  Administered 2024-06-12 – 2024-06-19 (×8): 4000 [IU] via ORAL
  Filled 2024-06-11 (×8): qty 4

## 2024-06-11 MED ORDER — HYDROCHLOROTHIAZIDE 12.5 MG PO TABS
6.2500 mg | ORAL_TABLET | Freq: Every day | ORAL | Status: DC
  Filled 2024-06-11: qty 1

## 2024-06-11 MED ORDER — BISOPROLOL FUMARATE 5 MG PO TABS
10.0000 mg | ORAL_TABLET | Freq: Every day | ORAL | Status: DC
  Filled 2024-06-11 (×2): qty 2

## 2024-06-11 MED ORDER — HEPARIN SODIUM (PORCINE) 1000 UNIT/ML IJ SOLN
INTRAMUSCULAR | Status: AC
  Filled 2024-06-11: qty 10

## 2024-06-11 MED ORDER — HYDRALAZINE HCL 50 MG PO TABS: 50.0000 mg | ORAL_TABLET | Freq: Two times a day (BID) | ORAL | Status: DC

## 2024-06-11 MED ORDER — FUROSEMIDE 20 MG PO TABS
20.0000 mg | ORAL_TABLET | Freq: Every day | ORAL | Status: DC
  Filled 2024-06-11: qty 1

## 2024-06-11 MED ORDER — ONDANSETRON HCL 4 MG PO TABS: 4.0000 mg | ORAL_TABLET | Freq: Four times a day (QID) | ORAL | Status: DC | PRN

## 2024-06-11 MED ORDER — LOSARTAN POTASSIUM 50 MG PO TABS
50.0000 mg | ORAL_TABLET | Freq: Every day | ORAL | Status: DC
  Filled 2024-06-11: qty 1

## 2024-06-11 MED ORDER — FENTANYL CITRATE (PF) 100 MCG/2ML IJ SOLN
INTRAMUSCULAR | Status: DC | PRN
  Administered 2024-06-11: 12.5 ug via INTRAVENOUS
  Administered 2024-06-11: 25 ug via INTRAVENOUS
  Administered 2024-06-11: 12.5 ug via INTRAVENOUS

## 2024-06-11 MED ORDER — DIPHENHYDRAMINE HCL 50 MG/ML IJ SOLN: 50.0000 mg | Freq: Once | INTRAMUSCULAR | Status: DC | PRN

## 2024-06-11 MED ORDER — HYDRALAZINE HCL 50 MG PO TABS
50.0000 mg | ORAL_TABLET | Freq: Three times a day (TID) | ORAL | Status: DC
  Administered 2024-06-11 – 2024-06-12 (×2): 50 mg via ORAL
  Filled 2024-06-11 (×2): qty 1

## 2024-06-11 MED ORDER — IODIXANOL 320 MG/ML IV SOLN
INTRAVENOUS | Status: DC | PRN
  Administered 2024-06-11: 45 mL via INTRAVENOUS

## 2024-06-11 MED ORDER — HEPARIN SODIUM (PORCINE) 1000 UNIT/ML IJ SOLN
INTRAMUSCULAR | Status: DC | PRN
  Administered 2024-06-11: 4000 [IU] via INTRAVENOUS

## 2024-06-11 MED ORDER — FLUTICASONE PROPIONATE 50 MCG/ACT NA SUSP
2.0000 | Freq: Two times a day (BID) | NASAL | Status: DC | PRN
  Administered 2024-06-19 (×2): 2 via NASAL

## 2024-06-11 MED ORDER — CLOPIDOGREL BISULFATE 75 MG PO TABS
75.0000 mg | ORAL_TABLET | Freq: Every day | ORAL | Status: DC
  Administered 2024-06-11 – 2024-06-12 (×2): 75 mg via ORAL
  Filled 2024-06-11 (×2): qty 1

## 2024-06-11 MED ORDER — TRAZODONE HCL 50 MG PO TABS
25.0000 mg | ORAL_TABLET | Freq: Every evening | ORAL | Status: DC | PRN
  Administered 2024-06-12 – 2024-06-17 (×6): 25 mg via ORAL
  Filled 2024-06-11 (×6): qty 1

## 2024-06-11 MED ORDER — HYDRALAZINE HCL 50 MG PO TABS
100.0000 mg | ORAL_TABLET | Freq: Every day | ORAL | Status: DC
  Filled 2024-06-11: qty 2

## 2024-06-11 MED ORDER — HEPARIN (PORCINE) IN NACL 2000-0.9 UNIT/L-% IV SOLN
INTRAVENOUS | Status: DC | PRN
  Administered 2024-06-11: 1000 mL

## 2024-06-11 MED ORDER — ACETAMINOPHEN 650 MG RE SUPP: 650.0000 mg | Freq: Four times a day (QID) | RECTAL | Status: DC | PRN

## 2024-06-11 MED ORDER — MAGNESIUM HYDROXIDE 400 MG/5ML PO SUSP
30.0000 mL | Freq: Every day | ORAL | Status: DC | PRN
Start: 2024-06-11 — End: 2024-06-20

## 2024-06-11 MED ORDER — CEFAZOLIN SODIUM-DEXTROSE 2-4 GM/100ML-% IV SOLN
2.0000 g | INTRAVENOUS | Status: AC
  Administered 2024-06-11: 2 g via INTRAVENOUS

## 2024-06-11 MED ORDER — FENTANYL CITRATE (PF) 100 MCG/2ML IJ SOLN
INTRAMUSCULAR | Status: AC
  Filled 2024-06-11: qty 2

## 2024-06-11 MED ORDER — MIDAZOLAM HCL 5 MG/5ML IJ SOLN
INTRAMUSCULAR | Status: AC
  Filled 2024-06-11: qty 5

## 2024-06-11 MED ORDER — LIDOCAINE 5 % EX PTCH
1.0000 | MEDICATED_PATCH | CUTANEOUS | Status: DC
  Administered 2024-06-11 – 2024-06-19 (×6): 1 via TRANSDERMAL
  Filled 2024-06-11 (×10): qty 1

## 2024-06-11 MED ORDER — ATORVASTATIN CALCIUM 10 MG PO TABS
10.0000 mg | ORAL_TABLET | Freq: Every day | ORAL | Status: DC
  Administered 2024-06-13 – 2024-06-19 (×7): 10 mg via ORAL
  Filled 2024-06-11 (×8): qty 1

## 2024-06-11 MED ORDER — HEPARIN (PORCINE) 25000 UT/250ML-% IV SOLN
1300.0000 [IU]/h | INTRAVENOUS | Status: DC
  Administered 2024-06-11: 1300 [IU]/h via INTRAVENOUS
  Filled 2024-06-11: qty 250

## 2024-06-11 MED ORDER — FENTANYL CITRATE PF 50 MCG/ML IJ SOSY
50.0000 ug | PREFILLED_SYRINGE | Freq: Once | INTRAMUSCULAR | Status: AC
  Administered 2024-06-11: 50 ug via INTRAVENOUS
  Filled 2024-06-11: qty 1

## 2024-06-11 MED ORDER — SODIUM CHLORIDE 0.9 % IV SOLN: INTRAVENOUS | Status: DC

## 2024-06-11 MED ORDER — MIDAZOLAM HCL 2 MG/2ML IJ SOLN
INTRAMUSCULAR | Status: DC | PRN
  Administered 2024-06-11: 1 mg via INTRAVENOUS
  Administered 2024-06-11: .5 mg via INTRAVENOUS
  Administered 2024-06-11: 1 mg via INTRAVENOUS

## 2024-06-11 MED ORDER — IOHEXOL 350 MG/ML SOLN
75.0000 mL | Freq: Once | INTRAVENOUS | Status: AC | PRN
  Administered 2024-06-11: 75 mL via INTRAVENOUS

## 2024-06-11 MED ORDER — LORATADINE 10 MG PO TABS
10.0000 mg | ORAL_TABLET | Freq: Every day | ORAL | Status: DC
  Administered 2024-06-12 – 2024-06-19 (×8): 10 mg via ORAL
  Filled 2024-06-11 (×8): qty 1

## 2024-06-11 MED ORDER — METHYLPREDNISOLONE SODIUM SUCC 125 MG IJ SOLR: 125.0000 mg | Freq: Once | INTRAMUSCULAR | Status: DC | PRN

## 2024-06-11 MED ORDER — FENTANYL CITRATE PF 50 MCG/ML IJ SOSY
50.0000 ug | PREFILLED_SYRINGE | INTRAMUSCULAR | Status: DC | PRN
  Administered 2024-06-11 – 2024-06-12 (×9): 50 ug via INTRAVENOUS
  Filled 2024-06-11 (×8): qty 1

## 2024-06-11 MED ORDER — BISOPROLOL-HYDROCHLOROTHIAZIDE 10-6.25 MG PO TABS: 1.0000 | ORAL_TABLET | Freq: Every day | ORAL | Status: DC

## 2024-06-11 MED ORDER — ONDANSETRON HCL 4 MG/2ML IJ SOLN
4.0000 mg | Freq: Four times a day (QID) | INTRAMUSCULAR | Status: DC | PRN
  Administered 2024-06-15: 4 mg via INTRAVENOUS
  Filled 2024-06-11: qty 2

## 2024-06-11 MED ORDER — ACETAMINOPHEN 325 MG PO TABS
650.0000 mg | ORAL_TABLET | Freq: Four times a day (QID) | ORAL | Status: DC | PRN
  Administered 2024-06-17 – 2024-06-19 (×2): 650 mg via ORAL
  Filled 2024-06-11 (×2): qty 2

## 2024-06-11 MED ORDER — FAMOTIDINE 20 MG PO TABS: 40.0000 mg | ORAL_TABLET | Freq: Once | ORAL | Status: DC | PRN

## 2024-06-11 MED ORDER — HEPARIN BOLUS VIA INFUSION
4500.0000 [IU] | Freq: Once | INTRAVENOUS | Status: AC
  Administered 2024-06-11: 4500 [IU] via INTRAVENOUS
  Filled 2024-06-11: qty 4500

## 2024-06-11 MED ORDER — ROPINIROLE HCL 1 MG PO TABS
0.5000 mg | ORAL_TABLET | Freq: Three times a day (TID) | ORAL | Status: DC
  Administered 2024-06-11 – 2024-06-19 (×25): 0.5 mg via ORAL
  Filled 2024-06-11 (×3): qty 1
  Filled 2024-06-11: qty 2
  Filled 2024-06-11 (×3): qty 1
  Filled 2024-06-11 (×2): qty 2
  Filled 2024-06-11 (×2): qty 1
  Filled 2024-06-11 (×2): qty 2
  Filled 2024-06-11: qty 1
  Filled 2024-06-11 (×2): qty 2
  Filled 2024-06-11 (×3): qty 1
  Filled 2024-06-11 (×2): qty 2
  Filled 2024-06-11 (×3): qty 1
  Filled 2024-06-11: qty 2
  Filled 2024-06-11: qty 1
  Filled 2024-06-11: qty 2
  Filled 2024-06-11: qty 1

## 2024-06-11 MED ORDER — ADULT MULTIVITAMIN W/MINERALS CH
1.0000 | ORAL_TABLET | Freq: Every day | ORAL | Status: DC
  Administered 2024-06-13 – 2024-06-19 (×7): 1 via ORAL
  Filled 2024-06-11 (×7): qty 1

## 2024-06-11 MED ORDER — HEPARIN (PORCINE) 25000 UT/250ML-% IV SOLN
1100.0000 [IU]/h | INTRAVENOUS | Status: DC
  Administered 2024-06-11 (×2): 1300 [IU]/h via INTRAVENOUS
  Filled 2024-06-11: qty 250

## 2024-06-11 MED ORDER — MIDAZOLAM HCL 2 MG/ML PO SYRP
8.0000 mg | ORAL_SOLUTION | Freq: Once | ORAL | Status: DC | PRN
  Filled 2024-06-11: qty 5

## 2024-06-11 MED ORDER — LIDOCAINE HCL (PF) 1 % IJ SOLN
INTRAMUSCULAR | Status: DC | PRN
  Administered 2024-06-11: 10 mL

## 2024-06-11 SURGICAL SUPPLY — 22 items
CANISTER PENUMBRA ENGINE (MISCELLANEOUS) IMPLANT
CATH ANGIO 5F PIGTAIL 100CM (CATHETERS) IMPLANT
CATH INDIGO 12XTORQ 100 (CATHETERS) IMPLANT
CATH INDIGO SEP 12 (CATHETERS) IMPLANT
CATH SELECT BERN TIP 5F 130 (CATHETERS) IMPLANT
CATH SELECT H1 TIP 5F 130 (CATHETERS) IMPLANT
CLOSURE PERCLOSE PROSTYLE (VASCULAR PRODUCTS) IMPLANT
COVER PROBE ULTRASOUND 5X96 (MISCELLANEOUS) IMPLANT
DEVICE TORQUE (MISCELLANEOUS) IMPLANT
GLIDEWIRE ANGLED SS 035X260CM (WIRE) IMPLANT
INTRODUCER PERFORM 12FR X13 (SHEATH) IMPLANT
NDL ENTRY 21GA 7CM ECHOTIP (NEEDLE) IMPLANT
NEEDLE ENTRY 21GA 7CM ECHOTIP (NEEDLE) ×1 IMPLANT
PACK ANGIOGRAPHY (CUSTOM PROCEDURE TRAY) ×1 IMPLANT
SET INTRO CAPELLA COAXIAL (SET/KITS/TRAYS/PACK) IMPLANT
SHEATH BRITE TIP 6FRX11 (SHEATH) IMPLANT
SUT MNCRL AB 4-0 PS2 18 (SUTURE) IMPLANT
SUT SILK 0 FSL (SUTURE) IMPLANT
SYR MEDRAD MARK 7 150ML (SYRINGE) IMPLANT
TUBING CONTRAST HIGH PRESS 72 (TUBING) IMPLANT
WIRE AMPLATZ SSTIFF .035X260CM (WIRE) IMPLANT
WIRE J 3MM .035X145CM (WIRE) IMPLANT

## 2024-06-11 NOTE — Assessment & Plan Note (Signed)
-   We will continue Requip. ?

## 2024-06-11 NOTE — H&P (View-Only) (Signed)
 Hospital Consult    Reason for Consult:  Pulmonary Embolism  Requesting Physician:  Dr Franky Moores MD  MRN #:  [id number]  History of Present Illness: This is a 88 y.o. female with a past medical history of anemia, gastric reflux, hypertension, prior MI, presents to the emergency department for shortness of breath. According to the patient she has stage IV lung cancer, currently being treated at the cancer center. She states today she developed acute onset of chest pain/tightness along with trouble breathing. Patient currently requiring 6 L of oxygen to maintain sats in the 90s, denies any baseline O2 requirement. Patient states chest tightness but denies any chest pain. In reviewing the patient's lab work it appears that she is on Plavix  as her only anticoagulant.   CTA confirms large PE including saddle PE.  Patient's lab work has resulted overall reassuring chemistry.  Lactic acid is elevated at 3.1.  Patient started on heparin .  Admitted to the hospital service. Vascular Surgery was consulted to evaluate.     Past Medical History:  Diagnosis Date   Allergy    Anemia    Arthritis    Knees, hands   Basal cell carcinoma    back - removed   Bladder polyp    With previos hematuria   Diverticulitis    Dyspnea    Dyspnea on exertion    GERD (gastroesophageal reflux disease)    History of ETT    Myoview 7/10 EF 78%, no ischemia or infarction. Poor exercise tolerance (2'30, stopped due to fatigue). Reached 86% MPHR.   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 IVAR Mori   BACK  SURGERY     BASAL CELL CARCINOMA EXCISION     back   BREAST BIOPSY Right 06/19/2017   Affirm Bx- Radial Scar   BREAST EXCISIONAL BIOPSY Right 1996   neg   BREAST LUMPECTOMY WITH NEEDLE LOCALIZATION Right 08/11/2017   Procedure: BREAST LUMPECTOMY WITH NEEDLE LOCALIZATION;  Surgeon: Claudene Larinda Bolder, MD;  Location: [facility];  Service: General;  Laterality: Right;   CARPAL TUNNEL RELEASE Right    CATARACT EXTRACTION W/ INTRAOCULAR LENS  IMPLANT, BILATERAL     EXCISION OF BREAST BIOPSY Right 08/11/2017   excision of radial Scar   EYE SURGERY     JOINT REPLACEMENT Right    knee   KNEE ARTHROSCOPY Right 04/22/2016   Procedure: ARTHROSCOPY KNEE;  Surgeon: Helayne Glenn, MD;  Location[facility];  Service: Orthopedics;  Laterality: Right;   knee replacement Right 01/11/2017   NASAL SINUS SURGERY  06/05/14   Dr. Milissa   POSTERIOR CERVICAL LAMINECTOMY  2005   C5-7; plate and screws   RENAL ANGIOGRAPHY Left 04/20/2020   Procedure: RENAL ANGIOGRAPHY;  Surgeon: Marea Selinda RAMAN, MD;  Location: [facility];  Service: Cardiovascular;  Laterality: Left;   TONSILLECTOMY     VIDEO BRONCHOSCOPY WITH ENDOBRONCHIAL NAVIGATION N/A 05/24/2024   Procedure: VIDEO BRONCHOSCOPY WITH ENDOBRONCHIAL NAVIGATION;  Surgeon: Parris Manna, MD;  Location: [facility];  Service: Thoracic;  Laterality: N/A;   VIDEO BRONCHOSCOPY WITH ENDOBRONCHIAL ULTRASOUND N/A 05/24/2024   Procedure: BRONCHOSCOPY, WITH EBUS;  Surgeon: Parris Manna, MD;  Location: [facility];  Service: Thoracic;  Laterality: N/A;    Allergies  Allergen Reactions   Chlorhexidine  Hives    SAGE wipes caused severe rash   Cyclobenzaprine Hcl     Doesn't remember reaction   Etodolac     Doesn't remember reaction   Lisinopril     Coughing up blood   Meloxicam     Doesn't remember reaction   Metaxalone     Doesn't remember reaction   Neomycin-Bacitracin Zn-Polymyx     Skin irritation   Nifedipine Swelling   Nitrofurantoin      Doesn't remember reaction   Sulfamethoxazole-Trimethoprim Hives   Sulfonamide Derivatives Hives   Tizanidine     Liver problems   Vasotec [Enalapril Maleate]     Doesn't remember reaction   Ibuprofen Rash   Tape Rash    Prior to Admission medications   Medication Sig Start Date End Date Taking? Authorizing Provider  acetaminophen  (TYLENOL ) 500 MG tablet Take 1,000 mg by mouth as needed for mild pain (pain score 1-3).   Yes [provider]  atorvastatin  (LIPITOR) 10 MG tablet Take 1 tablet (10 mg total) by mouth daily. 04/20/20 06/11/24 Yes Dew, Selinda RAMAN, MD  bisoprolol -hydrochlorothiazide  (ZIAC ) 10-6.25 MG per tablet Take 1 tablet by mouth daily.   Yes [provider]  cetirizine (ZYRTEC) 10 MG tablet Take 10 mg by mouth as needed.   Yes [provider]  Cholecalciferol  (VITAMIN D ) 50 MCG (2000 UT) tablet Take 4,000 Units by mouth daily.    Yes [provider]  clopidogrel  (PLAVIX ) 75 MG tablet Take 75 mg by mouth daily.   Yes [provider]  Cranberry 500 MG CAPS Take 1 capsule by mouth daily.   Yes [provider]  estradiol  (ESTRACE  VAGINAL) 0.1 MG/GM vaginal cream Apply 0.5mg  (pea-sized amount)  just inside the vaginal introitus with a finger-tip on Monday, Wednesday and Friday nights. 12/19/23  Yes McGowan, Clotilda A, PA-C  fluticasone  (FLONASE ) 50 MCG/ACT nasal spray Place 2 sprays into both nostrils as needed for allergies or rhinitis.   Yes [provider]  furosemide  (LASIX ) 20 MG tablet Take 20 mg by mouth daily.   Yes [provider]  hydrALAZINE  (APRESOLINE ) 50 MG tablet Take 50-100 mg by mouth 3 (three) times daily. TAKE 2 TABLETS BY MOUTH EVERY MORNING TAKE ONE TABLET MID-DAY AND TAKE ONE TABLET BEFORE BED   Yes [provider]  lidocaine  4 % Place 1 patch onto the skin daily. REMOVE & DISCARD PATCH WITHIN 12 HOURS OR AS DIRECTED BY MD 05/15/24  Yes [provider]  losartan  (COZAAR ) 100 MG  tablet Take 50 mg by mouth daily. 06/17/21  Yes [provider]  methocarbamol  (ROBAXIN ) 500 MG tablet Take 1 tablet (500 mg total) by mouth every 6 (six) hours as needed for muscle spasms. 05/14/24  Yes Laurita Pillion, MD  Multiple Vitamin (MULTIVITAMIN) capsule Take 2 capsules by mouth daily. [facility]   Yes [provider]  oxyCODONE -acetaminophen  (PERCOCET/ROXICET) 5-325 MG tablet Take 1 tablet by mouth every 4 (four) hours as needed for moderate pain (pain score 4-6). 05/30/24  Yes Jacobo Evalene PARAS, MD  rOPINIRole  (REQUIP ) 0.25 MG tablet Take 0.5 mg by mouth 3 (three) times daily.   Yes [provider]    Social History  Socioeconomic History   Marital status: Married    Spouse name: Not on file   Number of children: Not on file   Years of education: Not on file   Highest education level: Not on file  Occupational History   Occupation: Retired  Tobacco Use   Smoking status: Never   Smokeless tobacco: Never  Vaping Use   Vaping status: Never Used  Substance and Sexual Activity   Alcohol use: No   Drug use: No   Sexual activity: Not on file  Other Topics Concern   Not on file  Social History Narrative   Lives with husband (married)   Social Drivers of Corporate investment banker Strain: Low Risk  (05/15/2024)   Received from [facility]   Overall Financial Resource Strain (CARDIA)    Difficulty of Paying Living Expenses: Not hard at all  Food Insecurity: No Food Insecurity (05/15/2024)   Received from [facility]   Hunger Vital Sign    Within the past 12 months, you worried that your food would run out before you got the money to buy more.: Never true    Within the past 12 months, the food you bought just didn't last and you didn't have money to get more.: Never true  Transportation Needs: No Transportation Needs (05/15/2024)   Received from [facility] - Transportation    In the past  12 months, has lack of transportation kept you from medical appointments or from getting medications?: No    Lack of Transportation (Non-Medical): No  Physical Activity: Not on file  Stress: Not on file  Social Connections: Socially Integrated (05/10/2024)   Social Connection and Isolation Panel    Frequency of Communication with Friends and Family: More than three times a week    Frequency of Social Gatherings with Friends and Family: More than three times a week    Attends Religious Services: More than 4 times per year    Active Member of [employer] or Organizations: Yes    Attends Engineer, structural: More than 4 times per year    Marital Status: Married  Catering manager Violence: Not At Risk (05/10/2024)   Humiliation, Afraid, Rape, and Kick questionnaire    Fear of Current or Ex-Partner: No    Emotionally Abused: No    Physically Abused: No    Sexually Abused: No     Family History  Problem Relation Age of Onset   Ovarian cancer Mother    Cancer Mother    Stroke Father        CVA   Breast cancer Daughter 61   Breast cancer Maternal Aunt 60   Breast cancer Maternal Aunt 90   Prostate cancer Neg Hx    Bladder Cancer Neg Hx    Kidney cancer Neg Hx     ROS: Otherwise negative unless mentioned in HPI  Physical Examination  Vitals:   06/11/24 0530 06/11/24 0600  BP: 124/73 123/68  Pulse: 96 97  Resp: 19 12  Temp:    SpO2: 97% 97%   Body mass index is 35.1 kg/m.  General:  WDWN in NAD Gait: Not observed HENT: WNL, normocephalic Pulmonary: normal Positive labored breathing, without Rales, rhonchi,  wheezing. Positive conversational dyspnea. Cardiac: regular, Tachycardia, without  Murmurs, rubs or gallops; without carotid bruits Abdomen: Positive bowel sounds throughout, soft, NT/ND, no masses Skin: without rashes Vascular Exam/Pulses: Palpable pulses throughout, all extremities are warm to touch.  Extremities: without ischemic changes, without Gangrene ,  without cellulitis; without open wounds;  Musculoskeletal: no muscle wasting or atrophy  Neurologic: A&O X 3;  No focal weakness or paresthesias are detected; speech is fluent/normal Psychiatric:  The pt has Normal affect. Lymph:  Unremarkable  CBC    Component Value Date/Time   WBC 10.7 (H) 06/11/2024 0424   RBC 3.68 (L) 06/11/2024 0424   HGB 11.1 (L) 06/11/2024 0424   HGB 10.9 (L) 09/13/2013 0633   HCT 33.9 (L) 06/11/2024 0424   HCT 32.5 (L) 09/13/2013 0633   PLT 153 06/11/2024 0424   PLT 158 09/13/2013 0633   MCV 92.1 06/11/2024 0424   MCV 90 09/13/2013 0633   MCH 30.2 06/11/2024 0424   MCHC 32.7 06/11/2024 0424   RDW 14.0 06/11/2024 0424   RDW 14.1 09/13/2013 0633   LYMPHSABS 1.6 05/10/2024 1057   LYMPHSABS 2.6 09/13/2013 0633   MONOABS 0.8 05/10/2024 1057   MONOABS 1.1 (H) 09/13/2013 0633   EOSABS 0.1 05/10/2024 1057   EOSABS 0.0 09/13/2013 0633   BASOSABS 0.0 05/10/2024 1057   BASOSABS 0.0 09/13/2013 0633    BMET    Component Value Date/Time   NA 136 06/11/2024 0424   NA 140 09/11/2013 0517   K 4.3 06/11/2024 0424   K 3.7 09/11/2013 0517   CL 101 06/11/2024 0424   CL 108 (H) 09/11/2013 0517   CO2 26 06/11/2024 0424   CO2 26 09/11/2013 0517   GLUCOSE 146 (H) 06/11/2024 0424   GLUCOSE 138 (H) 09/11/2013 0517   BUN 31 (H) 06/11/2024 0424   BUN 26 (H) 09/11/2013 0517   CREATININE 1.27 (H) 06/11/2024 0424   CREATININE 1.14 09/11/2013 0517   CALCIUM  9.1 06/11/2024 0424   CALCIUM  8.9 09/11/2013 0517   GFRNONAA 41 (L) 06/11/2024 0424   GFRNONAA 47 (L) 09/11/2013 0517   GFRAA 60 (L) 04/20/2020 0951   GFRAA 55 (L) 09/11/2013 0517    COAGS: Lab Results  Component Value Date   INR 1.0 06/10/2024   INR 1.2 12/26/2020   INR 1.1 12/25/2020     Non-Invasive Vascular Imaging:   EXAM:06/11/24 CT ANGIOGRAPHY CHEST WITH CONTRAST   TECHNIQUE: Multidetector CT imaging of the chest was performed using the standard protocol during bolus administration of  intravenous contrast. Multiplanar CT image reconstructions and MIPs were obtained to evaluate the vascular anatomy.   RADIATION DOSE REDUCTION: This exam was performed according to the departmental dose-optimization program which includes automated exposure control, adjustment of the mA and/or kV according to patient size and/or use of iterative reconstruction technique.   CONTRAST:  75mL OMNIPAQUE  IOHEXOL  350 MG/ML SOLN   COMPARISON:  05/24/2024   FINDINGS: Cardiovascular: Atherosclerotic calcifications of the thoracic aorta are noted. No aneurysmal dilatation or dissection is seen. The pulmonary artery shows a normal branching pattern bilaterally. Extensive pulmonary emboli are noted right greater than left. There are changes consistent with right heart strain with an RV/LV ratio of 1.5.   Mediastinum/Nodes: Thoracic inlet is within normal limits. No hilar or mediastinal adenopathy is noted. The esophagus as visualized is within normal limits. Sliding-type hiatal hernia is seen.   Lungs/Pleura: Small left effusion is noted. Scattered nodular densities are seen similar to that noted on prior exam consistent with metastatic disease. The largest index lesion in the left lower lobe measures approximately 17 mm in greatest dimension stable from the prior study. Similar findings are noted within the right lung although a large pleural effusion is noted increased  when compared with the prior exam. Index lesion in the right lower lobe measures 17 mm also stable from the prior exam.   Upper Abdomen: Visualized upper abdomen shows stable cyst in the left lobe of the liver.   Musculoskeletal: Degenerative changes of the thoracic spine are noted. No compression deformity is noted.   Review of the MIP images confirms the above findings.   IMPRESSION: Extensive bilateral pulmonary emboli with evidence of right heart strain. The RV/LV ratio measures 1.5.   Scattered pulmonary nodules  again identified consistent with metastatic disease. The overall appearance is similar to that seen on the recent exam.   Aortic Atherosclerosis (ICD10-I70.0).   Critical Value/emergent results were called by telephone at the time of interpretation on 06/11/2024 at 1:24 am to Dr. KEVIN PADUCHOWSKI , who verbally acknowledged these results.  Statin:  Yes.   Beta Blocker:  No. Aspirin :  No. ACEI:  No. ARB:  Yes.   CCB use:  No Other antiplatelets/anticoagulants:  Yes.   Plavix  75 mg Daily    ASSESSMENT/PLAN: This is a 88 y.o. female with a medical history significant for stage IV lung cancer as well as GERD, essential hypertension, Alzheimer's dementia and myocardial infarction.  She presents to the emergency room with acute onset of shortness of breath after going to the bathroom with associated left-sided chest pain worsening with deep inspiration.  Upon workup patient underwent a CTA of the chest which reveals extensive bilateral pulmonary emboli with evidence of right heart strain with RV/LV ratio measuring 1.5.  Vascular surgery plans on taking the patient to the vascular lab today 06/11/2024 for a pulmonary thrombectomy.  I discussed in detail with the patient at the bedside the procedure, benefits, risk, and complications.  Patient verbalizes her understanding and wishes to proceed.  I answered all her questions this morning.  Patient has been n.p.o. since midnight last night for procedure today.  Patient was started on a heparin  drip which remains running at this time.  Patient's only previous anticoagulation was Plavix  75 mg daily.  I will be held.  Patient's current hemoglobin and hematocrit is 11.1/33.9 and patient's recent BUN is 31 and creatinine is 1.27.  Patient's current lactic acid is 1.4 down from 3.1   -I discussed the case in detail with Dr. Cordella Shawl MD and he agrees with the plan.   [name] [facility] 06/11/2024 7:35 AM

## 2024-06-11 NOTE — ED Notes (Signed)
 Pt getting Echo at this time, meds not given due to same.

## 2024-06-11 NOTE — Consult Note (Signed)
 Hospital Consult    Reason for Consult:  Pulmonary Embolism  Requesting Physician:  Dr Franky Moores MD  MRN #:  [id number]  History of Present Illness: This is a 88 y.o. female with a past medical history of anemia, gastric reflux, hypertension, prior MI, presents to the emergency department for shortness of breath. According to the patient she has stage IV lung cancer, currently being treated at the cancer center. She states today she developed acute onset of chest pain/tightness along with trouble breathing. Patient currently requiring 6 L of oxygen to maintain sats in the 90s, denies any baseline O2 requirement. Patient states chest tightness but denies any chest pain. In reviewing the patient's lab work it appears that she is on Plavix  as her only anticoagulant.   CTA confirms large PE including saddle PE.  Patient's lab work has resulted overall reassuring chemistry.  Lactic acid is elevated at 3.1.  Patient started on heparin .  Admitted to the hospital service. Vascular Surgery was consulted to evaluate.     Past Medical History:  Diagnosis Date   Allergy    Anemia    Arthritis    Knees, hands   Basal cell carcinoma    back - removed   Bladder polyp    With previos hematuria   Diverticulitis    Dyspnea    Dyspnea on exertion    GERD (gastroesophageal reflux disease)    History of ETT    Myoview 7/10 EF 78%, no ischemia or infarction. Poor exercise tolerance (2'30, stopped due to fatigue). Reached 86% MPHR.   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 IVAR Mori   BACK  SURGERY     BASAL CELL CARCINOMA EXCISION     back   BREAST BIOPSY Right 06/19/2017   Affirm Bx- Radial Scar   BREAST EXCISIONAL BIOPSY Right 1996   neg   BREAST LUMPECTOMY WITH NEEDLE LOCALIZATION Right 08/11/2017   Procedure: BREAST LUMPECTOMY WITH NEEDLE LOCALIZATION;  Surgeon: Claudene Larinda Bolder, MD;  Location: [facility];  Service: General;  Laterality: Right;   CARPAL TUNNEL RELEASE Right    CATARACT EXTRACTION W/ INTRAOCULAR LENS  IMPLANT, BILATERAL     EXCISION OF BREAST BIOPSY Right 08/11/2017   excision of radial Scar   EYE SURGERY     JOINT REPLACEMENT Right    knee   KNEE ARTHROSCOPY Right 04/22/2016   Procedure: ARTHROSCOPY KNEE;  Surgeon: Helayne Glenn, MD;  Location[facility];  Service: Orthopedics;  Laterality: Right;   knee replacement Right 01/11/2017   NASAL SINUS SURGERY  06/05/14   Dr. Milissa   POSTERIOR CERVICAL LAMINECTOMY  2005   C5-7; plate and screws   RENAL ANGIOGRAPHY Left 04/20/2020   Procedure: RENAL ANGIOGRAPHY;  Surgeon: Marea Selinda RAMAN, MD;  Location: [facility];  Service: Cardiovascular;  Laterality: Left;   TONSILLECTOMY     VIDEO BRONCHOSCOPY WITH ENDOBRONCHIAL NAVIGATION N/A 05/24/2024   Procedure: VIDEO BRONCHOSCOPY WITH ENDOBRONCHIAL NAVIGATION;  Surgeon: Parris Manna, MD;  Location: [facility];  Service: Thoracic;  Laterality: N/A;   VIDEO BRONCHOSCOPY WITH ENDOBRONCHIAL ULTRASOUND N/A 05/24/2024   Procedure: BRONCHOSCOPY, WITH EBUS;  Surgeon: Parris Manna, MD;  Location: [facility];  Service: Thoracic;  Laterality: N/A;    Allergies  Allergen Reactions   Chlorhexidine  Hives    SAGE wipes caused severe rash   Cyclobenzaprine Hcl     Doesn't remember reaction   Etodolac     Doesn't remember reaction   Lisinopril     Coughing up blood   Meloxicam     Doesn't remember reaction   Metaxalone     Doesn't remember reaction   Neomycin-Bacitracin Zn-Polymyx     Skin irritation   Nifedipine Swelling   Nitrofurantoin      Doesn't remember reaction   Sulfamethoxazole-Trimethoprim Hives   Sulfonamide Derivatives Hives   Tizanidine     Liver problems   Vasotec [Enalapril Maleate]     Doesn't remember reaction   Ibuprofen Rash   Tape Rash    Prior to Admission medications   Medication Sig Start Date End Date Taking? Authorizing Provider  acetaminophen  (TYLENOL ) 500 MG tablet Take 1,000 mg by mouth as needed for mild pain (pain score 1-3).   Yes [provider]  atorvastatin  (LIPITOR) 10 MG tablet Take 1 tablet (10 mg total) by mouth daily. 04/20/20 06/11/24 Yes Dew, Selinda RAMAN, MD  bisoprolol -hydrochlorothiazide  (ZIAC ) 10-6.25 MG per tablet Take 1 tablet by mouth daily.   Yes [provider]  cetirizine (ZYRTEC) 10 MG tablet Take 10 mg by mouth as needed.   Yes [provider]  Cholecalciferol  (VITAMIN D ) 50 MCG (2000 UT) tablet Take 4,000 Units by mouth daily.    Yes [provider]  clopidogrel  (PLAVIX ) 75 MG tablet Take 75 mg by mouth daily.   Yes [provider]  Cranberry 500 MG CAPS Take 1 capsule by mouth daily.   Yes [provider]  estradiol  (ESTRACE  VAGINAL) 0.1 MG/GM vaginal cream Apply 0.5mg  (pea-sized amount)  just inside the vaginal introitus with a finger-tip on Monday, Wednesday and Friday nights. 12/19/23  Yes McGowan, Clotilda A, PA-C  fluticasone  (FLONASE ) 50 MCG/ACT nasal spray Place 2 sprays into both nostrils as needed for allergies or rhinitis.   Yes [provider]  furosemide  (LASIX ) 20 MG tablet Take 20 mg by mouth daily.   Yes [provider]  hydrALAZINE  (APRESOLINE ) 50 MG tablet Take 50-100 mg by mouth 3 (three) times daily. TAKE 2 TABLETS BY MOUTH EVERY MORNING TAKE ONE TABLET MID-DAY AND TAKE ONE TABLET BEFORE BED   Yes [provider]  lidocaine  4 % Place 1 patch onto the skin daily. REMOVE & DISCARD PATCH WITHIN 12 HOURS OR AS DIRECTED BY MD 05/15/24  Yes [provider]  losartan  (COZAAR ) 100 MG  tablet Take 50 mg by mouth daily. 06/17/21  Yes [provider]  methocarbamol  (ROBAXIN ) 500 MG tablet Take 1 tablet (500 mg total) by mouth every 6 (six) hours as needed for muscle spasms. 05/14/24  Yes Laurita Pillion, MD  Multiple Vitamin (MULTIVITAMIN) capsule Take 2 capsules by mouth daily. [facility]   Yes [provider]  oxyCODONE -acetaminophen  (PERCOCET/ROXICET) 5-325 MG tablet Take 1 tablet by mouth every 4 (four) hours as needed for moderate pain (pain score 4-6). 05/30/24  Yes Jacobo Evalene PARAS, MD  rOPINIRole  (REQUIP ) 0.25 MG tablet Take 0.5 mg by mouth 3 (three) times daily.   Yes [provider]    Social History  Socioeconomic History   Marital status: Married    Spouse name: Not on file   Number of children: Not on file   Years of education: Not on file   Highest education level: Not on file  Occupational History   Occupation: Retired  Tobacco Use   Smoking status: Never   Smokeless tobacco: Never  Vaping Use   Vaping status: Never Used  Substance and Sexual Activity   Alcohol use: No   Drug use: No   Sexual activity: Not on file  Other Topics Concern   Not on file  Social History Narrative   Lives with husband (married)   Social Drivers of Corporate investment banker Strain: Low Risk  (05/15/2024)   Received from [facility]   Overall Financial Resource Strain (CARDIA)    Difficulty of Paying Living Expenses: Not hard at all  Food Insecurity: No Food Insecurity (05/15/2024)   Received from [facility]   Hunger Vital Sign    Within the past 12 months, you worried that your food would run out before you got the money to buy more.: Never true    Within the past 12 months, the food you bought just didn't last and you didn't have money to get more.: Never true  Transportation Needs: No Transportation Needs (05/15/2024)   Received from [facility] - Transportation    In the past  12 months, has lack of transportation kept you from medical appointments or from getting medications?: No    Lack of Transportation (Non-Medical): No  Physical Activity: Not on file  Stress: Not on file  Social Connections: Socially Integrated (05/10/2024)   Social Connection and Isolation Panel    Frequency of Communication with Friends and Family: More than three times a week    Frequency of Social Gatherings with Friends and Family: More than three times a week    Attends Religious Services: More than 4 times per year    Active Member of [employer] or Organizations: Yes    Attends Engineer, structural: More than 4 times per year    Marital Status: Married  Catering manager Violence: Not At Risk (05/10/2024)   Humiliation, Afraid, Rape, and Kick questionnaire    Fear of Current or Ex-Partner: No    Emotionally Abused: No    Physically Abused: No    Sexually Abused: No     Family History  Problem Relation Age of Onset   Ovarian cancer Mother    Cancer Mother    Stroke Father        CVA   Breast cancer Daughter 61   Breast cancer Maternal Aunt 60   Breast cancer Maternal Aunt 90   Prostate cancer Neg Hx    Bladder Cancer Neg Hx    Kidney cancer Neg Hx     ROS: Otherwise negative unless mentioned in HPI  Physical Examination  Vitals:   06/11/24 0530 06/11/24 0600  BP: 124/73 123/68  Pulse: 96 97  Resp: 19 12  Temp:    SpO2: 97% 97%   Body mass index is 35.1 kg/m.  General:  WDWN in NAD Gait: Not observed HENT: WNL, normocephalic Pulmonary: normal Positive labored breathing, without Rales, rhonchi,  wheezing. Positive conversational dyspnea. Cardiac: regular, Tachycardia, without  Murmurs, rubs or gallops; without carotid bruits Abdomen: Positive bowel sounds throughout, soft, NT/ND, no masses Skin: without rashes Vascular Exam/Pulses: Palpable pulses throughout, all extremities are warm to touch.  Extremities: without ischemic changes, without Gangrene ,  without cellulitis; without open wounds;  Musculoskeletal: no muscle wasting or atrophy  Neurologic: A&O X 3;  No focal weakness or paresthesias are detected; speech is fluent/normal Psychiatric:  The pt has Normal affect. Lymph:  Unremarkable  CBC    Component Value Date/Time   WBC 10.7 (H) 06/11/2024 0424   RBC 3.68 (L) 06/11/2024 0424   HGB 11.1 (L) 06/11/2024 0424   HGB 10.9 (L) 09/13/2013 0633   HCT 33.9 (L) 06/11/2024 0424   HCT 32.5 (L) 09/13/2013 0633   PLT 153 06/11/2024 0424   PLT 158 09/13/2013 0633   MCV 92.1 06/11/2024 0424   MCV 90 09/13/2013 0633   MCH 30.2 06/11/2024 0424   MCHC 32.7 06/11/2024 0424   RDW 14.0 06/11/2024 0424   RDW 14.1 09/13/2013 0633   LYMPHSABS 1.6 05/10/2024 1057   LYMPHSABS 2.6 09/13/2013 0633   MONOABS 0.8 05/10/2024 1057   MONOABS 1.1 (H) 09/13/2013 0633   EOSABS 0.1 05/10/2024 1057   EOSABS 0.0 09/13/2013 0633   BASOSABS 0.0 05/10/2024 1057   BASOSABS 0.0 09/13/2013 0633    BMET    Component Value Date/Time   NA 136 06/11/2024 0424   NA 140 09/11/2013 0517   K 4.3 06/11/2024 0424   K 3.7 09/11/2013 0517   CL 101 06/11/2024 0424   CL 108 (H) 09/11/2013 0517   CO2 26 06/11/2024 0424   CO2 26 09/11/2013 0517   GLUCOSE 146 (H) 06/11/2024 0424   GLUCOSE 138 (H) 09/11/2013 0517   BUN 31 (H) 06/11/2024 0424   BUN 26 (H) 09/11/2013 0517   CREATININE 1.27 (H) 06/11/2024 0424   CREATININE 1.14 09/11/2013 0517   CALCIUM  9.1 06/11/2024 0424   CALCIUM  8.9 09/11/2013 0517   GFRNONAA 41 (L) 06/11/2024 0424   GFRNONAA 47 (L) 09/11/2013 0517   GFRAA 60 (L) 04/20/2020 0951   GFRAA 55 (L) 09/11/2013 0517    COAGS: Lab Results  Component Value Date   INR 1.0 06/10/2024   INR 1.2 12/26/2020   INR 1.1 12/25/2020     Non-Invasive Vascular Imaging:   EXAM:06/11/24 CT ANGIOGRAPHY CHEST WITH CONTRAST   TECHNIQUE: Multidetector CT imaging of the chest was performed using the standard protocol during bolus administration of  intravenous contrast. Multiplanar CT image reconstructions and MIPs were obtained to evaluate the vascular anatomy.   RADIATION DOSE REDUCTION: This exam was performed according to the departmental dose-optimization program which includes automated exposure control, adjustment of the mA and/or kV according to patient size and/or use of iterative reconstruction technique.   CONTRAST:  75mL OMNIPAQUE  IOHEXOL  350 MG/ML SOLN   COMPARISON:  05/24/2024   FINDINGS: Cardiovascular: Atherosclerotic calcifications of the thoracic aorta are noted. No aneurysmal dilatation or dissection is seen. The pulmonary artery shows a normal branching pattern bilaterally. Extensive pulmonary emboli are noted right greater than left. There are changes consistent with right heart strain with an RV/LV ratio of 1.5.   Mediastinum/Nodes: Thoracic inlet is within normal limits. No hilar or mediastinal adenopathy is noted. The esophagus as visualized is within normal limits. Sliding-type hiatal hernia is seen.   Lungs/Pleura: Small left effusion is noted. Scattered nodular densities are seen similar to that noted on prior exam consistent with metastatic disease. The largest index lesion in the left lower lobe measures approximately 17 mm in greatest dimension stable from the prior study. Similar findings are noted within the right lung although a large pleural effusion is noted increased  when compared with the prior exam. Index lesion in the right lower lobe measures 17 mm also stable from the prior exam.   Upper Abdomen: Visualized upper abdomen shows stable cyst in the left lobe of the liver.   Musculoskeletal: Degenerative changes of the thoracic spine are noted. No compression deformity is noted.   Review of the MIP images confirms the above findings.   IMPRESSION: Extensive bilateral pulmonary emboli with evidence of right heart strain. The RV/LV ratio measures 1.5.   Scattered pulmonary nodules  again identified consistent with metastatic disease. The overall appearance is similar to that seen on the recent exam.   Aortic Atherosclerosis (ICD10-I70.0).   Critical Value/emergent results were called by telephone at the time of interpretation on 06/11/2024 at 1:24 am to Dr. KEVIN PADUCHOWSKI , who verbally acknowledged these results.  Statin:  Yes.   Beta Blocker:  No. Aspirin :  No. ACEI:  No. ARB:  Yes.   CCB use:  No Other antiplatelets/anticoagulants:  Yes.   Plavix  75 mg Daily    ASSESSMENT/PLAN: This is a 88 y.o. female with a medical history significant for stage IV lung cancer as well as GERD, essential hypertension, Alzheimer's dementia and myocardial infarction.  She presents to the emergency room with acute onset of shortness of breath after going to the bathroom with associated left-sided chest pain worsening with deep inspiration.  Upon workup patient underwent a CTA of the chest which reveals extensive bilateral pulmonary emboli with evidence of right heart strain with RV/LV ratio measuring 1.5.  Vascular surgery plans on taking the patient to the vascular lab today 06/11/2024 for a pulmonary thrombectomy.  I discussed in detail with the patient at the bedside the procedure, benefits, risk, and complications.  Patient verbalizes her understanding and wishes to proceed.  I answered all her questions this morning.  Patient has been n.p.o. since midnight last night for procedure today.  Patient was started on a heparin  drip which remains running at this time.  Patient's only previous anticoagulation was Plavix  75 mg daily.  I will be held.  Patient's current hemoglobin and hematocrit is 11.1/33.9 and patient's recent BUN is 31 and creatinine is 1.27.  Patient's current lactic acid is 1.4 down from 3.1   -I discussed the case in detail with Dr. Cordella Shawl MD and he agrees with the plan.   [name] [facility] 06/11/2024 7:35 AM

## 2024-06-11 NOTE — ED Notes (Addendum)
 Called ICU to give report. Was advised that RN Chiquita will call me back to receive same.

## 2024-06-11 NOTE — Assessment & Plan Note (Signed)
Will continue antihypertensive therapy.

## 2024-06-11 NOTE — Assessment & Plan Note (Signed)
 Will continue statin therapy

## 2024-06-11 NOTE — H&P (Addendum)
 [place]   PATIENT NAME: Julie Mcbride    MR#:  [id number]  DATE OF BIRTH:  [birth year]  DATE OF ADMISSION:  06/10/2024  PRIMARY CARE PHYSICIAN: Alla Amis, MD   Patient is coming from: Home  REQUESTING/REFERRING PHYSICIAN: Dorothyann Drivers, MD   CHIEF COMPLAINT:  Shortness of breath.  HISTORY OF PRESENT ILLNESS:  Julie Mcbride is a 88 y.o. Caucasian female with medical history significant for recently diagnosed stage IV lung cancer, followed by Dr. Jacobo, osteoarthritis, diverticulitis, GERD, essential hypertension, Alzheimer's dementia and MI who presented to the emergency room with acute onset of shortness of breath after going to the bathroom with associated left sided chest pain worsening with deep breathing as well as occasional wheezing.  She denied any nausea or vomiting or abdominal pain.  No fever or chills.  No leg pain or edema or recent travels or surgeries.  ED Course: When the patient came to the ER, heart rate was 109 with respiratory rate of 35 and pulse oximetry of 95% on 6 L of O2 by nasal cannula.  Labs revealed BUN of 38 and creatinine 1.48 with blood glucose of 160 with otherwise unremarkable CMP.  BNP was 195.9 and lactic acid 3.1.  CBC showed leukocytosis of 11.1 and anemia.  PT/INR were normal. EKG as reviewed by me :  EKG showed sinus tachycardia with a rate of 131 with PACs and PVCs, right bundle branch block and Q waves anteroseptally and inferiorly. Imaging: Portable chest x-ray showed the following: 1. Scattered bilateral patchy airspace opacities in both lungs increasing in the left upper lobe. Findings may represent edema or infection. 2. Small bilateral pleural effusions. 3. Stable scattered nodular densities. 4. Cardiomegaly.  Chest CTA revealed the following: Extensive bilateral pulmonary emboli with evidence of right heart strain. The RV/LV ratio measures 1.5.   Scattered pulmonary nodules again identified consistent  with metastatic disease. The overall appearance is similar to that seen on the recent exam.   Aortic Atherosclerosis.  The patient was given IV heparin  bolus and drip as well as 50 mcg of IV fentanyl .  Dr. Jama was contacted about the patient and will plan for thrombectomy later today.  The patient will be admitted to a stepdown unit bed for further evaluation and management. PAST MEDICAL HISTORY:   Past Medical History:  Diagnosis Date   Allergy    Anemia    Arthritis    Knees, hands   Basal cell carcinoma    back - removed   Bladder polyp    With previos hematuria   Diverticulitis    Dyspnea    Dyspnea on exertion    GERD (gastroesophageal reflux disease)    History of ETT    Myoview 7/10 EF 78%, no ischemia or infarction. Poor exercise tolerance (2'30, stopped due to fatigue). Reached 86% MPHR.   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 IVAR Mori   BACK SURGERY  BASAL CELL CARCINOMA EXCISION     back   BREAST BIOPSY Right 06/19/2017   Affirm Bx- Radial Scar   BREAST EXCISIONAL BIOPSY Right 1996   neg   BREAST LUMPECTOMY WITH NEEDLE LOCALIZATION Right 08/11/2017   Procedure: BREAST LUMPECTOMY WITH NEEDLE LOCALIZATION;  Surgeon: Claudene Larinda Bolder, MD;  Location: [facility] ORS;  Service: General;  Laterality: Right;   CARPAL TUNNEL RELEASE Right    CATARACT EXTRACTION W/ INTRAOCULAR LENS  IMPLANT, BILATERAL     EXCISION OF BREAST BIOPSY Right 08/11/2017   excision of radial Scar   EYE SURGERY     JOINT REPLACEMENT Right    knee   KNEE  ARTHROSCOPY Right 04/22/2016   Procedure: ARTHROSCOPY KNEE;  Surgeon: Helayne Glenn, MD;  Location: [facility];  Service: Orthopedics;  Laterality: Right;   knee replacement Right 01/11/2017   NASAL SINUS SURGERY  06/05/14   Dr. Milissa   POSTERIOR CERVICAL LAMINECTOMY  2005   C5-7; plate and screws   RENAL ANGIOGRAPHY Left 04/20/2020   Procedure: RENAL ANGIOGRAPHY;  Surgeon: Marea Selinda RAMAN, MD;  Location: [facility];  Service: Cardiovascular;  Laterality: Left;   TONSILLECTOMY     VIDEO BRONCHOSCOPY WITH ENDOBRONCHIAL NAVIGATION N/A 05/24/2024   Procedure: VIDEO BRONCHOSCOPY WITH ENDOBRONCHIAL NAVIGATION;  Surgeon: Parris Manna, MD;  Location: [facility] ORS;  Service: Thoracic;  Laterality: N/A;   VIDEO BRONCHOSCOPY WITH ENDOBRONCHIAL ULTRASOUND N/A 05/24/2024   Procedure: BRONCHOSCOPY, WITH EBUS;  Surgeon: Parris Manna, MD;  Location: [facility] OR[facility];  Service: Thoracic;  Laterality: N/A;    SOCIAL HISTORY:   Social History   Tobacco Use   Smoking status: Never   Smokeless tobacco: Never  Substance Use Topics   Alcohol use: No    FAMILY HISTORY:   Family History  Problem Relation Age of Onset   Ovarian cancer Mother    Cancer Mother    Stroke Father        CVA   Breast cancer Daughter 71   Breast cancer Maternal Aunt 60   Breast cancer Maternal Aunt 90   Prostate cancer Neg Hx    Bladder Cancer Neg Hx    Kidney cancer Neg Hx     DRUG ALLERGIES:   Allergies  Allergen Reactions   Chlorhexidine  Hives    SAGE wipes caused severe rash   Cyclobenzaprine Hcl     Doesn't remember reaction   Etodolac     Doesn't remember reaction   Lisinopril     Coughing up blood   Meloxicam     Doesn't remember reaction   Metaxalone     Doesn't remember reaction   Neomycin-Bacitracin Zn-Polymyx     Skin irritation   Nifedipine Swelling   Nitrofurantoin     Doesn't remember reaction   Sulfamethoxazole-Trimethoprim Hives   Sulfonamide Derivatives Hives   Tizanidine      Liver problems   Vasotec [Enalapril Maleate]     Doesn't remember reaction   Ibuprofen Rash   Tape Rash    REVIEW OF SYSTEMS:   ROS As per history of present illness. All pertinent systems were reviewed above. Constitutional, HEENT, cardiovascular, respiratory, GI, GU, musculoskeletal, neuro, psychiatric, endocrine, integumentary and hematologic systems were reviewed and are otherwise negative/unremarkable except for positive findings mentioned above in the HPI.   MEDICATIONS AT HOME:   Prior to Admission medications   Medication Sig Start Date End Date Taking? Authorizing Provider  acetaminophen  (TYLENOL ) 500 MG tablet Take 1,000 mg by mouth as needed for  mild pain (pain score 1-3).   Yes [provider]  atorvastatin  (LIPITOR) 10 MG tablet Take 1 tablet (10 mg total) by mouth daily. 04/20/20 06/11/24 Yes Dew, Selinda RAMAN, MD  bisoprolol -hydrochlorothiazide  (ZIAC ) 10-6.25 MG per tablet Take 1 tablet by mouth daily.   Yes [provider]  cetirizine (ZYRTEC) 10 MG tablet Take 10 mg by mouth as needed.   Yes [provider]  Cholecalciferol  (VITAMIN D ) 50 MCG (2000 UT) tablet Take 4,000 Units by mouth daily.    Yes [provider]  clopidogrel  (PLAVIX ) 75 MG tablet Take 75 mg by mouth daily.   Yes [provider]  Cranberry 500 MG CAPS Take 1 capsule by mouth daily.   Yes [provider]  estradiol  (ESTRACE  VAGINAL) 0.1 MG/GM vaginal cream Apply 0.5mg  (pea-sized amount)  just inside the vaginal introitus with a finger-tip on Monday, Wednesday and Friday nights. 12/19/23  Yes McGowan, Clotilda A, PA-C  fluticasone  (FLONASE ) 50 MCG/ACT nasal spray Place 2 sprays into both nostrils as needed for allergies or rhinitis.   Yes [provider]  furosemide  (LASIX ) 20 MG tablet Take 20 mg by mouth daily.   Yes [provider]  hydrALAZINE  (APRESOLINE ) 50 MG tablet Take 50-100 mg by mouth 3 (three) times daily. TAKE 2 TABLETS BY MOUTH  EVERY MORNING TAKE ONE TABLET MID-DAY AND TAKE ONE TABLET BEFORE BED   Yes [provider]  lidocaine  4 % Place 1 patch onto the skin daily. REMOVE & DISCARD PATCH WITHIN 12 HOURS OR AS DIRECTED BY MD 05/15/24  Yes [provider]  losartan  (COZAAR ) 100 MG tablet Take 50 mg by mouth daily. 06/17/21  Yes [provider]  methocarbamol  (ROBAXIN ) 500 MG tablet Take 1 tablet (500 mg total) by mouth every 6 (six) hours as needed for muscle spasms. 05/14/24  Yes Laurita Pillion, MD  Multiple Vitamin (MULTIVITAMIN) capsule Take 2 capsules by mouth daily. [employer] MVI   Yes [provider]  oxyCODONE -acetaminophen  (PERCOCET/ROXICET) 5-325 MG tablet Take 1 tablet by mouth every 4 (four) hours as needed for moderate pain (pain score 4-6). 05/30/24  Yes Jacobo Evalene PARAS, MD  rOPINIRole  (REQUIP ) 0.25 MG tablet Take 0.5 mg by mouth 3 (three) times daily.   Yes [provider]      VITAL SIGNS:  Blood pressure 118/68, pulse (!) 103, temperature 98.2 F (36.8 C), temperature source Oral, resp. rate 15, height 5' 5 (1.651 m), SpO2 95%.  PHYSICAL EXAMINATION:  Physical Exam  GENERAL:  88 y.o.-year-old patient lying in the bed with mild to moderate respiratory distress with conversational dyspnea. EYES: Pupils equal, round, reactive to light and accommodation. No scleral icterus. Extraocular muscles intact.  HEENT: Head atraumatic, normocephalic. Oropharynx and nasopharynx clear.  NECK:  Supple, no jugular venous distention. No thyroid enlargement, no tenderness.  LUNGS: Diminished bibasilar breath sounds.  No use of accessory muscles of respiration.  CARDIOVASCULAR: Regular rate and rhythm, S1, S2 normal. No murmurs, rubs, or gallops.  ABDOMEN: Soft, nondistended, nontender. Bowel sounds present. No organomegaly or mass.  EXTREMITIES: No pedal edema, cyanosis, or clubbing.  NEUROLOGIC: Cranial nerves II through XII are intact. Muscle strength 5/5 in all extremities.  Sensation intact. Gait not checked.  PSYCHIATRIC: The patient is alert and oriented x 3.  Normal affect and good eye contact. SKIN: No obvious rash, lesion, or ulcer.   LABORATORY PANEL:   CBC Recent Labs  Lab 06/11/24 0424  WBC 10.7*  HGB 11.1*  HCT 33.9*  PLT 153   ------------------------------------------------------------------------------------------------------------------  Chemistries  Recent Labs  Lab 06/11/24 0424  NA 136  K 4.3  CL 101  CO2 26  GLUCOSE 146*  BUN 31*  CREATININE 1.27*  CALCIUM  9.1   ------------------------------------------------------------------------------------------------------------------  Cardiac Enzymes No results for input(s): TROPONINI in the last 168 hours. ------------------------------------------------------------------------------------------------------------------  RADIOLOGY:  CT Angio Chest PE W and/or Wo Contrast Result Date: 06/11/2024 CLINICAL DATA:  Shortness of breath and left-sided chest pain, known history of lung carcinoma EXAM: CT ANGIOGRAPHY CHEST WITH CONTRAST TECHNIQUE: Multidetector CT imaging of the chest was performed using the standard protocol during bolus administration of intravenous contrast. Multiplanar CT image reconstructions and MIPs were obtained to evaluate the vascular anatomy. RADIATION DOSE REDUCTION: This exam was performed according to the departmental dose-optimization program which includes automated exposure control, adjustment of the mA and/or kV according to patient size and/or use of iterative reconstruction technique. CONTRAST:  75mL OMNIPAQUE  IOHEXOL  350 MG/ML SOLN COMPARISON:  05/24/2024 FINDINGS: Cardiovascular: Atherosclerotic calcifications of the thoracic aorta are noted. No aneurysmal dilatation or dissection is seen. The pulmonary artery shows a normal branching pattern bilaterally. Extensive pulmonary emboli are noted right greater than left. There are changes consistent with right  heart strain with an RV/LV ratio of 1.5. Mediastinum/Nodes: Thoracic inlet is within normal limits. No hilar or mediastinal adenopathy is noted. The esophagus as visualized is within normal limits. Sliding-type hiatal hernia is seen. Lungs/Pleura: Small left effusion is noted. Scattered nodular densities are seen similar to that noted on prior exam consistent with metastatic disease. The largest index lesion in the left lower lobe measures approximately 17 mm in greatest dimension stable from the prior study. Similar findings are noted within the right lung although a large pleural effusion is noted increased when compared with the prior exam. Index lesion in the right lower lobe measures 17 mm also stable from the prior exam. Upper Abdomen: Visualized upper abdomen shows stable cyst in the left lobe of the liver. Musculoskeletal: Degenerative changes of the thoracic spine are noted. No compression deformity is noted. Review of the MIP images confirms the above findings. IMPRESSION: Extensive bilateral pulmonary emboli with evidence of right heart strain. The RV/LV ratio measures 1.5. Scattered pulmonary nodules again identified consistent with metastatic disease. The overall appearance is similar to that seen on the recent exam. Aortic Atherosclerosis (ICD10-I70.0). Critical Value/emergent results were called by telephone at the time of interpretation on 06/11/2024 at 1:24 am to Dr. KEVIN PADUCHOWSKI , who verbally acknowledged these results. Electronically Signed   By: Oneil Devonshire M.D.   On: 06/11/2024 01:26   DG Chest Port 1 View Result Date: 06/10/2024 CLINICAL DATA:  Shortness of breath EXAM: PORTABLE CHEST 1 VIEW COMPARISON:  Chest x-ray 05/24/2024.  CT of the chest 05/24/2024. FINDINGS: Heart is enlarged. There are small bilateral pleural effusions. There scattered bilateral patchy airspace opacities in both lungs increasing in the left upper lobe. Scattered nodular densities are grossly unchanged. No  pneumothorax or acute fracture identified. IMPRESSION: 1. Scattered bilateral patchy airspace opacities in both lungs increasing in the left upper lobe. Findings may represent edema or infection. 2. Small bilateral pleural effusions. 3. Stable scattered nodular densities. 4. Cardiomegaly. Electronically Signed   By: Greig Pique M.D.   On: 06/10/2024 23:14      IMPRESSION AND PLAN:  Assessment and Plan: * Acute saddle pulmonary embolism (HCC) - This is acute submassive PE with RV strain/cor pulmonale.  She has subsequent acute respiratory failure with hypoxia. -  The patient will be admitted to a stepdown unit bed. - Will continue her on IV heparin . - O2 protocol will be followed. - Vascular surgery consult will be obtained. - Dr. Jama was notified and is aware about the patient. - She will be kept n.p.o. for now.  Dyslipidemia - Will continue statin therapy.  Ischemic cardiomyopathy - Will continue Lasix  and ARB therapy.  Essential hypertension - Will continue antihypertensive therapy.  Restless leg syndrome - We will continue Requip .   DVT prophylaxis: IV heparin  Advanced Care Planning:  Code Status: The patient is DNR and DNI.  This was discussed with her and her husband. Family Communication:  The plan of care was discussed in details with the patient (and family). I answered all questions. The patient agreed to proceed with the above mentioned plan. Further management will depend upon hospital course. Disposition Plan: Back to previous home environment Consults called: Vascular surgery. All the records are reviewed and case discussed with ED provider.  Status is: Inpatient  At the time of the admission, it appears that the appropriate admission status for this patient is inpatient.  This is judged to be reasonable and necessary in order to provide the required intensity of service to ensure the patient's safety given the presenting symptoms, physical exam findings and  initial radiographic and laboratory data in the context of comorbid conditions.  The patient requires inpatient status due to high intensity of service, high risk of further deterioration and high frequency of surveillance required.  I certify that at the time of admission, it is my clinical judgment that the patient will require inpatient hospital care extending more than 2 midnights.                            Dispo: The patient is from: Home              Anticipated d/c is to: Home              Patient currently is not medically stable to d/c.              Difficult to place patient: No  Madison DELENA Peaches M.D on 06/11/2024 at 5:14 AM  [facility]   From 7 PM-7 AM, contact night-coverage [email or web address]  CC: Primary care physician; Alla Amis, MD

## 2024-06-11 NOTE — Interval H&P Note (Signed)
 History and Physical Interval Note:  06/11/2024 11:25 AM  Julie Mcbride  has presented today for surgery, with the diagnosis of Saddle Pulmonary Embolism.  The various methods of treatment have been discussed with the patient and family. After consideration of risks, benefits and other options for treatment, the patient has consented to  Procedure(s): PULMONARY THROMBECTOMY (Bilateral) as a surgical intervention.  The patient's history has been reviewed, patient examined, no change in status, stable for surgery.  I have reviewed the patient's chart and labs.  Questions were answered to the patient's satisfaction.     Cordella Shawl

## 2024-06-11 NOTE — Progress Notes (Signed)
 Brief hospitalist update note.  This is a nonbillable note.  Please see same-day H&P for full billable details.  Briefly, 88 year old female history of metastatic lung cancer followed by Dr. Jacobo in the cancer center presents with acute shortness of breath associated left-sided chest pain associated with deep inspiration.  On CT angio found to have extensive bilateral pulmonary emboli with right heart strain.  Meets submassive by criteria.  Vascular surgery engaged.  Plan for pulmonary thrombectomy today.  Will likely need ICU/stepdown level of care post thrombectomy.  If stable can transition back to general medical floor on 7/16.  Calvin Robson MD  No charge

## 2024-06-11 NOTE — Progress Notes (Signed)
 PHARMACY - ANTICOAGULATION CONSULT NOTE  Pharmacy Consult for Heparin   Indication: pulmonary embolus  Allergies  Allergen Reactions   Chlorhexidine  Hives    SAGE wipes caused severe rash   Cyclobenzaprine Hcl     Doesn't remember reaction   Etodolac     Doesn't remember reaction   Lisinopril     Coughing up blood   Meloxicam     Doesn't remember reaction   Metaxalone     Doesn't remember reaction   Neomycin-Bacitracin Zn-Polymyx     Skin irritation   Nifedipine Swelling   Nitrofurantoin     Doesn't remember reaction   Sulfamethoxazole-Trimethoprim Hives   Sulfonamide Derivatives Hives   Tizanidine     Liver problems   Vasotec [Enalapril Maleate]     Doesn't remember reaction   Ibuprofen Rash   Tape Rash    Patient Measurements: Height: 5' 6 (167.6 cm) Weight: 96.9 kg (213 lb 10 oz) IBW/kg (Calculated) : 59.3 HEPARIN  DW (KG): 81  Vital Signs: Temp: 98.1 F (36.7 C) (07/15 2000) Temp Source: Oral (07/15 2000) BP: 114/52 (07/15 2136) Pulse Rate: 85 (07/15 2136)  Labs: Recent Labs    06/10/24 2252 06/11/24 0424 06/11/24 2149  HGB 10.9* 11.1*  --   HCT 34.3* 33.9*  --   PLT 171 153  --   LABPROT 14.1  --   --   INR 1.0  --   --   HEPARINUNFRC  --   --  0.80*  CREATININE 1.48* 1.27*  --     Estimated Creatinine Clearance: 38 mL/min (A) (by C-G formula based on SCr of 1.27 mg/dL (H)).   Medical History: Past Medical History:  Diagnosis Date   Allergy    Anemia    Arthritis    Knees, hands   Basal cell carcinoma    back - removed   Bladder polyp    With previos hematuria   Diverticulitis    Dyspnea    Dyspnea on exertion    GERD (gastroesophageal reflux disease)    History of ETT    Myoview 7/10 EF 78%, no ischemia or infarction. Poor exercise tolerance (2'30, stopped due to fatigue). Reached 86% MPHR.   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   Taking As Needed   atorvastatin  (LIPITOR) 10 MG tablet Take 1 tablet (10 mg total) by mouth daily. 30 tablet 11 06/10/2024   bisoprolol -hydrochlorothiazide  (ZIAC ) 10-6.25 MG per tablet Take 1 tablet by mouth daily.   06/10/2024   cetirizine (ZYRTEC) 10 MG tablet Take 10 mg by mouth as needed.   Unknown   Cholecalciferol  (VITAMIN D ) 50 MCG (2000 UT) tablet Take 4,000 Units by mouth daily.    06/10/2024   clopidogrel  (PLAVIX ) 75 MG tablet Take 75 mg by mouth daily.   06/10/2024   Cranberry 500 MG CAPS Take 1 capsule by mouth daily.   06/10/2024   estradiol  (ESTRACE  VAGINAL) 0.1 MG/GM vaginal cream  Apply 0.5mg  (pea-sized amount)  just inside the vaginal introitus with a finger-tip on Monday, Wednesday and Friday nights. 30 g 12 06/10/2024   fluticasone  (FLONASE ) 50 MCG/ACT nasal spray Place 2 sprays into both nostrils as needed for allergies or rhinitis.   Unknown   furosemide  (LASIX ) 20 MG tablet Take 20 mg by mouth daily.   Taking   hydrALAZINE  (APRESOLINE ) 50 MG tablet Take 50-100 mg by mouth 3 (three) times daily. TAKE 2 TABLETS BY MOUTH EVERY MORNING TAKE ONE TABLET MID-DAY AND TAKE ONE TABLET BEFORE BED   06/10/2024 Evening   lidocaine  4 % Place 1 patch onto the skin daily. REMOVE & DISCARD PATCH WITHIN 12 HOURS OR AS DIRECTED BY MD   Unknown   losartan  (COZAAR ) 100 MG tablet Take 50 mg by mouth daily.   06/10/2024   methocarbamol  (ROBAXIN ) 500 MG tablet Take 1 tablet (500 mg total) by mouth every 6 (six) hours as needed for muscle spasms. 30 tablet 0 Unknown   Multiple Vitamin (MULTIVITAMIN) capsule Take 2 capsules by  mouth daily. [employer] MVI   06/10/2024   oxyCODONE -acetaminophen  (PERCOCET/ROXICET) 5-325 MG tablet Take 1 tablet by mouth every 4 (four) hours as needed for moderate pain (pain score 4-6). 60 tablet 0 Unknown   rOPINIRole  (REQUIP ) 0.25 MG tablet Take 0.5 mg by mouth 3 (three) times daily.   06/10/2024    Assessment: Pharmacy consulted to dose heparin  in this 88 year old female admitted with PE.  No prior anticoag noted. CrCl = 31.8 ml/min  Goal of Therapy:  Heparin  level 0.3-0.7 units/ml Monitor platelets by anticoagulation protocol: Yes   0715 @ 2149 : HL 0.80 Supratherapeutic  Plan:  Reduce heparin  infusion to 1100 units/hr Check anti-Xa level 8 hours after rate change and daily while on heparin  Continue to monitor H&H and platelets  Will M. Lenon, PharmD Clinical Pharmacist 06/11/2024 10:29 PM

## 2024-06-11 NOTE — ED Notes (Addendum)
 Pt is CAOx4, breathing normally, and normal in color. Pt has good understanding of condition and plan of care and able to relay it back to me. Pt's family at bedside. Pt denies any needs and is in NAD at this time.

## 2024-06-11 NOTE — Op Note (Signed)
 [facility]  Percutaneous Study/Intervention Procedural Note   Date of Surgery: 06/11/2024,1:02 PM  Surgeon:Valori Hollenkamp, Cordella MATSU   Pre-operative Diagnosis: Symptomatic pulmonary emboli with right heart strain and hypoxia  Post-operative diagnosis:  Same  Procedure(s) Performed:  1.  Contrast injection right heart and bilateral pulmonary arteries  2.  Mechanical thrombectomy bilateral lobar pulmonary arteries for removal of pulmonary emboli using the Penumbra CAT 8 thrombectomy catheter.  3.  Selective catheter placement right upper lobe pulmonary artery, middle lobe pulmonary artery and lower lobe pulmonary artery  4.  Selective catheter placement left upper lobe pulmonary artery and lower lobe pulmonary artery    Anesthesia: Conscious sedation was administered under my direct supervision by the interventional radiology RN. IV Versed  plus fentanyl  were utilized. Continuous ECG, pulse oximetry and blood pressure was monitored throughout the entire procedure.  Versed  and fentanyl  were administered intravenously.  Conscious sedation was administered for a total of 61 minutes.  Sheath: 12 [place] Cook sheath right common femoral vein antegrade  Contrast: 45 cc   Fluoroscopy Time: 23.2 minutes  Indications:  Patient presents with pulmonary emboli. The patient is symptomatic with hypoxemia and dyspnea on exertion.  There is evidence of right heart strain on the CT angiogram. The patient is otherwise a good candidate for intervention and even the long-term benefits pulmonary angiography with thrombolysis is offered. The risks and benefits are reviewed long-term benefits are discussed. All questions are answered patient agrees to proceed.  Procedure:  Julie Mcbride a 88 y.o. female who was identified and appropriate procedural time out was performed.  The patient was then placed supine on the table and prepped and draped in the usual sterile fashion.  Ultrasound was  used to evaluate the right common femoral vein.  It was patent, as it was echolucent and compressible.  A digital ultrasound image was acquired for the permanent record.  A micropuncture needle was used to access the right common femoral vein under direct ultrasound guidance.  A microwire was then advanced under fluoroscopic guidance followed by micro-sheath.  A 0.035 J wire was advanced without resistance and a 5Fr sheath was placed.  Perclose devices were then used in a preclose fashion and then upsized to an 12 [place] sheath.    The wire and pigtail catheter were then negotiated into the right atrium and bolus injection of contrast was utilized to demonstrate the right ventricle and the pulmonary artery outflow.   4000 units of heparin  was then given and allowed to circulate.  The J-wire and pigtail catheter was advanced up to the right atrium where a bolus injection contrast was used to demonstrate the pulmonary artery outflow.  Stiff angled Glidewire was then exchanged for the J-wire and the pigtail catheter was used to select the pulmonary outflow track.  The left main pulmonary artery was evaluated first.  The pigtail catheter was then advanced into the left main pulmonary artery.  Then with the catheter in the left main pulmonary artery bolus injection contrast was utilized to demonstrate the thrombus as well as the segmental pulmonary artery vasculature. This demonstrated thrombus in the distal left main pulmonary artery extending into the left upper lobe artery as well as the left lower lobe artery and noting that it was near occlusive.    The Penumbra Cat 12 extra torque catheter was then advanced into the thrombus in the left lower lobe pulmonary artery.  Hand-injection contrast was used to verify the positioning and evaluate the distal anatomy.  Mechanical aspiration was performed using the CAT 12 catheter and a separator.  After multiple passes the catheter was then repositioned into the left  upper lobe pulmonary artery and again hand-injection contrast was performed to verify positioning and evaluate the distal anatomy.  Multiple passes were made using mechanical aspiration in association with a separator.  Once there was free flow of blood from both lobar arteries the catheter was repositioned to the left main pulmonary artery.  Hand-injection contrast was then performed to give an assessment of the left pulmonary vasculature and the effectiveness of thrombectomy.  Satisfied with the thrombectomy on the left, I then used the penumbra CAT 12 device as well as a Glidewire and an angled catheter to select the right main pulmonary artery  A select catheter was then advanced over the Amplatz wire into the distal right main pulmonary artery and hand-injection confirmed the thrombus. The Penumbra Cat 12 extra torque catheter was then advanced into the thrombus in the right lower lobe pulmonary artery.  Hand-injection contrast was used to verify positioning and evaluate the distal anatomy.  Mechanical aspiration was performed using the CAT 12 catheter and a separator.  After multiple passes the catheter was then repositioned into the right middle lobe pulmonary artery and again hand-injection contrast was performed to verify position and evaluate the distal anatomy.  Multiple passes were made using mechanical aspiration in association with a separator.  Lastly, using a combination of the separator catheter and Glidewire the CAT 12 device was negotiated into the right upper lobe pulmonary artery.  Hand-injection of contrast was used to verify positioning and evaluate the distal anatomy.  Multiple passes were then performed using the separator with the CAT 12 penumbra catheter.  Once there was free flow of blood from all 3 lobar arteries the catheter was repositioned to the right main pulmonary artery.  Hand-injection contrast was then used to give an assessment of the effectiveness of thrombectomy.  A  bolus injection of contrast was then used to create a final image of the pulmonary vasculature.  After review these images the catheter and sheath were removed and pressure held. There were no immediate complications.    Findings:   Right heart imaging:  Right atrium and right ventricle and the pulmonary outflow tract appears normal  Right lung: The initial images of the right lung demonstrate thrombus within the distal right main pulmonary artery extending into the right upper, middle and lower lobar arteries.  There is thrombus extending into the segmental branches as well.  Following thrombectomy there appears to be dramatic improvement of the previously identified thrombus with recanalization of the upper middle and lower lobes.  Left lung:  The initial images of the left lung demonstrate thrombus within the distal left main pulmonary artery extending into the left upper and lower lobar arteries.  There is thrombus extending into the segmental branches as well.  Following thrombectomy there appears to be near total resolution of the previously identified thrombus.    Disposition: Patient was taken to the recovery room in stable condition having tolerated the procedure well.  Cordella Kyah Buesing 06/11/2024,1:02 PM

## 2024-06-11 NOTE — Assessment & Plan Note (Signed)
-   Will continue Lasix  and ARB therapy.

## 2024-06-11 NOTE — Progress Notes (Signed)
 PHARMACY - ANTICOAGULATION CONSULT NOTE  Pharmacy Consult for Heparin   Indication: pulmonary embolus  Allergies  Allergen Reactions   Chlorhexidine  Hives    SAGE wipes caused severe rash   Cyclobenzaprine Hcl     Doesn't remember reaction   Etodolac     Doesn't remember reaction   Lisinopril     Coughing up blood   Meloxicam     Doesn't remember reaction   Metaxalone     Doesn't remember reaction   Neomycin-Bacitracin Zn-Polymyx     Skin irritation   Nifedipine Swelling   Nitrofurantoin     Doesn't remember reaction   Sulfamethoxazole-Trimethoprim Hives   Sulfonamide Derivatives Hives   Tizanidine     Liver problems   Vasotec [Enalapril Maleate]     Doesn't remember reaction   Ibuprofen Rash   Tape Rash    Patient Measurements: Height: 5' 5 (165.1 cm) IBW/kg (Calculated) : 57  Vital Signs: Temp: 97.7 F (36.5 C) (07/14 2237) Temp Source: Oral (07/14 2237) BP: 119/70 (07/15 0130) Pulse Rate: 109 (07/15 0130)  Labs: Recent Labs    06/10/24 2252  HGB 10.9*  HCT 34.3*  PLT 171  LABPROT 14.1  INR 1.0  CREATININE 1.48*    Estimated Creatinine Clearance: 31.8 mL/min (A) (by C-G formula based on SCr of 1.48 mg/dL (H)).   Medical History: Past Medical History:  Diagnosis Date   Allergy    Anemia    Arthritis    Knees, hands   Basal cell carcinoma    back - removed   Bladder polyp    With previos hematuria   Diverticulitis    Dyspnea    Dyspnea on exertion    GERD (gastroesophageal reflux disease)    History of ETT    Myoview 7/10 EF 78%, no ischemia or infarction. Poor exercise tolerance (2'30, stopped due to fatigue). Reached 86% MPHR.   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  No prior anticoag noted. CrCl = 31.8 ml/min  Goal of Therapy:  Heparin  level 0.3-0.7 units/ml Monitor platelets by anticoagulation protocol: Yes   Plan:  Give 4500 units bolus x 1 Start heparin  infusion at 1300 units/hr Check anti-Xa level in 8 hours and daily while on heparin  Continue to monitor H&H and platelets  Varvara Legault D 06/11/2024,1:46 AM

## 2024-06-11 NOTE — ED Notes (Signed)
 Report given to [facility], RN. Patient being prepared to move to ED room 32.

## 2024-06-11 NOTE — Progress Notes (Signed)
PHARMACIST - PHYSICIAN ORDER COMMUNICATION  CONCERNING: P&T Medication Policy on Herbal Medications  DESCRIPTION:  This patient's order for:  Cranberry tablets  has been noted.  This product(s) is classified as an "herbal" or natural product. Due to a lack of definitive safety studies or FDA approval, nonstandard manufacturing practices, plus the potential risk of unknown drug-drug interactions while on inpatient medications, the Pharmacy and Therapeutics Committee does not permit the use of "herbal" or natural products of this type within [place].   ACTION TAKEN: The pharmacy department is unable to verify this order at this time and your patient has been informed of this safety policy. Please reevaluate patient's clinical condition at discharge and address if the herbal or natural product(s) should be resumed at that time.  

## 2024-06-11 NOTE — Progress Notes (Signed)
*  PRELIMINARY RESULTS* Echocardiogram 2D Echocardiogram has been performed.  Floydene Harder 06/11/2024, 10:23 AM

## 2024-06-11 NOTE — Assessment & Plan Note (Addendum)
-   This is acute submassive PE with RV strain/cor pulmonale.  She has subsequent acute respiratory failure with hypoxia. - The patient will be admitted to a stepdown unit bed. - Will continue her on IV heparin . - O2 protocol will be followed. - Vascular surgery consult will be obtained. - Dr. Jama was notified and is aware about the patient. - She will be kept n.p.o. for now.

## 2024-06-12 ENCOUNTER — Encounter: Payer: Self-pay | Admitting: Vascular Surgery

## 2024-06-12 ENCOUNTER — Other Ambulatory Visit (HOSPITAL_COMMUNITY): Payer: Self-pay

## 2024-06-12 ENCOUNTER — Telehealth (HOSPITAL_COMMUNITY): Payer: Self-pay | Admitting: Pharmacy Technician

## 2024-06-12 DIAGNOSIS — I2609 Other pulmonary embolism with acute cor pulmonale: Secondary | ICD-10-CM

## 2024-06-12 DIAGNOSIS — F0390 Unspecified dementia without behavioral disturbance: Secondary | ICD-10-CM | POA: Insufficient documentation

## 2024-06-12 DIAGNOSIS — I2510 Atherosclerotic heart disease of native coronary artery without angina pectoris: Secondary | ICD-10-CM | POA: Insufficient documentation

## 2024-06-12 DIAGNOSIS — I2602 Saddle embolus of pulmonary artery with acute cor pulmonale: Secondary | ICD-10-CM

## 2024-06-12 DIAGNOSIS — I2699 Other pulmonary embolism without acute cor pulmonale: Secondary | ICD-10-CM

## 2024-06-12 DIAGNOSIS — Z515 Encounter for palliative care: Secondary | ICD-10-CM

## 2024-06-12 LAB — CBC
HCT: 29 % — ABNORMAL LOW (ref 36.0–46.0)
Hemoglobin: 9.4 g/dL — ABNORMAL LOW (ref 12.0–15.0)
MCH: 29.9 pg (ref 26.0–34.0)
MCHC: 32.4 g/dL (ref 30.0–36.0)
MCV: 92.4 fL (ref 80.0–100.0)
Platelets: 144 K/uL — ABNORMAL LOW (ref 150–400)
RBC: 3.14 MIL/uL — ABNORMAL LOW (ref 3.87–5.11)
RDW: 14.2 % (ref 11.5–15.5)
WBC: 10.3 K/uL (ref 4.0–10.5)
nRBC: 0 % (ref 0.0–0.2)

## 2024-06-12 LAB — HEPARIN LEVEL (UNFRACTIONATED): Heparin Unfractionated: 0.61 [IU]/mL (ref 0.30–0.70)

## 2024-06-12 MED ORDER — APIXABAN 5 MG PO TABS
5.0000 mg | ORAL_TABLET | Freq: Two times a day (BID) | ORAL | Status: DC
  Administered 2024-06-12 – 2024-06-19 (×15): 5 mg via ORAL
  Filled 2024-06-12 (×15): qty 1

## 2024-06-12 MED ORDER — ASPIRIN 81 MG PO TBEC
81.0000 mg | DELAYED_RELEASE_TABLET | Freq: Every day | ORAL | Status: DC
  Administered 2024-06-13 – 2024-06-19 (×7): 81 mg via ORAL
  Filled 2024-06-12 (×7): qty 1

## 2024-06-12 NOTE — Progress Notes (Signed)
 PHARMACY - ANTICOAGULATION CONSULT NOTE  Pharmacy Consult for Heparin   Indication: pulmonary embolus  Allergies  Allergen Reactions   Chlorhexidine  Hives    SAGE wipes caused severe rash   Cyclobenzaprine Hcl     Doesn't remember reaction   Etodolac     Doesn't remember reaction   Lisinopril     Coughing up blood   Meloxicam     Doesn't remember reaction   Metaxalone     Doesn't remember reaction   Neomycin-Bacitracin Zn-Polymyx     Skin irritation   Nifedipine Swelling   Nitrofurantoin     Doesn't remember reaction   Sulfamethoxazole-Trimethoprim Hives   Sulfonamide Derivatives Hives   Tizanidine     Liver problems   Vasotec [Enalapril Maleate]     Doesn't remember reaction   Ibuprofen Rash   Tape Rash    Patient Measurements: Height: 5' 6 (167.6 cm) Weight: 96.9 kg (213 lb 10 oz) IBW/kg (Calculated) : 59.3 HEPARIN  DW (KG): 81  Vital Signs: Temp: 97.5 F (36.4 C) (07/16 0400) Temp Source: Oral (07/16 0400) BP: 107/44 (07/16 0700) Pulse Rate: 82 (07/16 0700)  Labs: Recent Labs    06/10/24 2252 06/11/24 0424 06/11/24 2149  HGB 10.9* 11.1*  --   HCT 34.3* 33.9*  --   PLT 171 153  --   LABPROT 14.1  --   --   INR 1.0  --   --   HEPARINUNFRC  --   --  0.80*  CREATININE 1.48* 1.27*  --     Estimated Creatinine Clearance: 38 mL/min (A) (by C-G formula based on SCr of 1.27 mg/dL (H)).   Medical History: Past Medical History:  Diagnosis Date   Allergy    Anemia    Arthritis    Knees, hands   Basal cell carcinoma    back - removed   Bladder polyp    With previos hematuria   Diverticulitis    Dyspnea    Dyspnea on exertion    GERD (gastroesophageal reflux disease)    History of ETT    Myoview 7/10 EF 78%, no ischemia or infarction. Poor exercise tolerance (2'30, stopped due to fatigue). Reached 86% MPHR.   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   Taking As Needed   atorvastatin  (LIPITOR) 10 MG tablet Take 1 tablet (10 mg total) by mouth daily. 30 tablet 11 06/10/2024   bisoprolol -hydrochlorothiazide  (ZIAC ) 10-6.25 MG per tablet Take 1 tablet by mouth daily.   06/10/2024   cetirizine (ZYRTEC) 10 MG tablet Take 10 mg by mouth as needed.   Unknown   Cholecalciferol  (VITAMIN D ) 50 MCG (2000 UT) tablet Take 4,000 Units by mouth daily.    06/10/2024   clopidogrel  (PLAVIX ) 75 MG tablet Take 75 mg by mouth daily.   06/10/2024   Cranberry 500 MG CAPS Take 1 capsule by mouth daily.   06/10/2024   estradiol  (ESTRACE  VAGINAL) 0.1 MG/GM vaginal cream  Apply 0.5mg  (pea-sized amount)  just inside the vaginal introitus with a finger-tip on Monday, Wednesday and Friday nights. 30 g 12 06/10/2024   fluticasone  (FLONASE ) 50 MCG/ACT nasal spray Place 2 sprays into both nostrils as needed for allergies or rhinitis.   Unknown   furosemide  (LASIX ) 20 MG tablet Take 20 mg by mouth daily.   Taking   hydrALAZINE  (APRESOLINE ) 50 MG tablet Take 50-100 mg by mouth 3 (three) times daily. TAKE 2 TABLETS BY MOUTH EVERY MORNING TAKE ONE TABLET MID-DAY AND TAKE ONE TABLET BEFORE BED   06/10/2024 Evening   lidocaine  4 % Place 1 patch onto the skin daily. REMOVE & DISCARD PATCH WITHIN 12 HOURS OR AS DIRECTED BY MD   Unknown   losartan  (COZAAR ) 100 MG tablet Take 50 mg by mouth daily.   06/10/2024   methocarbamol  (ROBAXIN ) 500 MG tablet Take 1 tablet (500 mg total) by mouth every 6 (six) hours as needed for muscle spasms. 30 tablet 0 Unknown   Multiple Vitamin (MULTIVITAMIN) capsule Take 2 capsules by  mouth daily. [facility] MVI   06/10/2024   oxyCODONE -acetaminophen  (PERCOCET/ROXICET) 5-325 MG tablet Take 1 tablet by mouth every 4 (four) hours as needed for moderate pain (pain score 4-6). 60 tablet 0 Unknown   rOPINIRole  (REQUIP ) 0.25 MG tablet Take 0.5 mg by mouth 3 (three) times daily.   06/10/2024    Assessment: 88 y.o. female with medical history significant for recently diagnosed stage IV lung cancer, followed by Dr. Jacobo, osteoarthritis, diverticulitis, GERD, essential hypertension, Alzheimer's dementia and MI who presented to the emergency room with acute onset of shortness of breath CT angio shows extensive bilateral pulmonary emboli with evidence of right heart strain No prior anticoagulation preio to admission discoverable on review of medical records  Goal of Therapy:  anti-Xa level 0.3-0.7 units/ml Monitor platelets by anticoagulation protocol: Yes   Plan: anti-Xa level therapeutic x 1 ---continue heparin  infusion at 1100 units/hr ---Check anti-Xa level in 8 hours to confirm ---Continue to monitor H&H and platelets   Clinical Pharmacist 06/12/2024 7:15 AM

## 2024-06-12 NOTE — Evaluation (Addendum)
 Clinical/Bedside Swallow Evaluation Patient Details  Name: Julie Mcbride MRN: [id number] Date of Birth: [birth year]  Today's Date: 06/12/2024 Time: SLP Start Time (ACUTE ONLY): 1450 SLP Stop Time (ACUTE ONLY): 1535 SLP Time Calculation (min) (ACUTE ONLY): 45 min  Past Medical History:  Past Medical History:  Diagnosis Date   Allergy    Anemia    Arthritis    Knees, hands   Basal cell carcinoma    back - removed   Bladder polyp    With previos hematuria   Diverticulitis    Dyspnea    Dyspnea on exertion    GERD (gastroesophageal reflux disease)    History of ETT    Myoview 7/10 EF 78%, no ischemia or infarction. Poor exercise tolerance (2'30, stopped due to fatigue). Reached 86% MPHR.   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 IVAR Mori   BACK SURGERY     BASAL CELL CARCINOMA EXCISION     back   BREAST BIOPSY Right 06/19/2017   Affirm Bx- Radial Scar   BREAST EXCISIONAL BIOPSY Right 1996   neg   BREAST LUMPECTOMY WITH NEEDLE LOCALIZATION Right 08/11/2017   Procedure: BREAST LUMPECTOMY WITH NEEDLE LOCALIZATION;  Surgeon: Claudene Larinda Bolder, MD;  Location: [facility];  Service: General;  Laterality: Right;   CARPAL TUNNEL RELEASE Right    CATARACT EXTRACTION W/ INTRAOCULAR LENS  IMPLANT, BILATERAL     EXCISION OF BREAST BIOPSY Right 08/11/2017   excision of radial Scar   EYE SURGERY     JOINT REPLACEMENT Right    knee   KNEE ARTHROSCOPY Right 04/22/2016   Procedure: ARTHROSCOPY KNEE;  Surgeon: Helayne Glenn, MD;  Location: [facility];   Service: Orthopedics;  Laterality: Right;   knee replacement Right 01/11/2017   NASAL SINUS SURGERY  06/05/14   Dr. Milissa   POSTERIOR CERVICAL LAMINECTOMY  2005   C5-7; plate and screws   PULMONARY THROMBECTOMY Bilateral 06/11/2024   Procedure: PULMONARY THROMBECTOMY;  Surgeon: Jama Cordella MATSU, MD;  Location: [facility];  Service: Cardiovascular;  Laterality: Bilateral;   RENAL ANGIOGRAPHY Left 04/20/2020   Procedure: RENAL ANGIOGRAPHY;  Surgeon: Marea Selinda RAMAN, MD;  Location: [facility];  Service: Cardiovascular;  Laterality: Left;   TONSILLECTOMY     VIDEO BRONCHOSCOPY WITH ENDOBRONCHIAL NAVIGATION N/A 05/24/2024   Procedure: VIDEO BRONCHOSCOPY WITH ENDOBRONCHIAL NAVIGATION;  Surgeon: Parris Manna, MD;  Location: [facility];  Service: Thoracic;  Laterality: N/A;   VIDEO BRONCHOSCOPY WITH ENDOBRONCHIAL ULTRASOUND N/A 05/24/2024   Procedure: BRONCHOSCOPY, WITH EBUS;  Surgeon: Parris Manna, MD;  Location: [facility];  Service: Thoracic;  Laterality: N/A;   HPI:  Pt is a 88 y.o. Caucasian female with medical history significant for recently diagnosed Stage IV lung Cancer, followed by Dr. Jacobo, osteoarthritis, diverticulitis, GERD, essential hypertension, Alzheimer's dementia and MI who presented to the emergency room with acute onset of shortness of breath after going to the bathroom with associated left sided chest pain worsening  with deep breathing as well as occasional wheezing.  She denied any nausea or vomiting or abdominal pain.  No fever or chills.  Pt admitted w/ dx of acute saddle pulmonary embolism and subsequent acute respiratory failure with hypoxia per MD.    Assessment / Plan / Recommendation  Clinical Impression   Pt seen for BSE. Pt awake, A/Ox3. Eating her lunch meal feeding self (salad) upon entering room. Son present.  On [place] O2 support; afebrile. WBC WNL.  Pt appears to present w/ functional oropharyngeal phase swallowing w/ No overt oropharyngeal phase  dysphagia noted, No neuromuscular deficits noted. Pt consumed po trials w/ No overt, clinical s/s of aspiration during po trials(salad at lunch meal).  Pt appears at reduced risk for aspiration following general aspiration precautions. However, pt does have challenging factors that could impact her oropharyngeal swallowing to include deconditioning/weakness, Pulmonary decline, GERD, Cognitive decline, and advanced age. These factors can increase risk for dysphagia as well as decreased oral intake overall.   During po trials, pt consumed all consistencies w/ no overt coughing, decline in vocal quality, or change in respiratory presentation during/post trials. O2 sats remained 98%. Oral phase appeared grossly [facility] w/ timely bolus management, mastication, and control of bolus propulsion for A-P transfer for swallowing. Oral clearing achieved w/ all trial consistencies -- moistened, soft foods given. OM Exam appeared [facility] w/ no unilateral weakness noted. Speech Clear. Pt fed self w/ setup support.   Recommend continue a Regular consistency diet w/ well-Cut meats, moistened foods; Thin liquids -- monitor straw use while lying in bed. Support tray setup for pt to self-feed. Recommend general aspiration precautions, REFLUX precautions. Pills WHOLE in Puree for safer, easier swallowing as needed; currently using 1 Pill at a time w/ water.  Education given on Pills in Puree as option for D/C; food consistencies and easy to eat options; general aspiration precautions to pt and Son. NSG to reconsult if any new needs arise. NSG updated, agreed. MD updated. Recommend Dietician f/u for support. SLP Visit Diagnosis: Dysphagia, unspecified (R13.10)    Aspiration Risk   (reduced following general precs.)    Diet Recommendation   Thin;Age appropriate regular (moist foods) = a Regular consistency diet w/ well-Cut meats, moistened foods; Thin liquids -- monitor straw use while lying in bed. Support tray setup for pt to  self-feed. Recommend general aspiration precautions, REFLUX precautions.   Medication Administration: Whole meds with puree (as needed for ease of swallowing)    Other  Recommendations Recommended Consults:  (Palliative following; Dietician) Oral Care Recommendations: Oral care BID;Patient independent with oral care (setup support)     Assistance Recommended at Discharge  intermittent  Functional Status Assessment Patient has not had a recent decline in their functional status  Frequency and Duration  (n/a)   (n/a)       Prognosis Prognosis for improved oropharyngeal function: Good Barriers to Reach Goals: Cognitive deficits Barriers/Prognosis Comment: Dementia; Pulmonary decline      Swallow Study   General Date of Onset: 06/11/24 HPI: Pt is a 88 y.o. Caucasian female with medical history significant for recently diagnosed Stage IV lung Cancer, followed by Dr. Jacobo, osteoarthritis, diverticulitis, GERD, essential hypertension, Alzheimer's dementia and MI who presented to the emergency room with acute onset of shortness of breath after going to the bathroom with associated left sided chest pain worsening with deep breathing as well as occasional wheezing.  She denied any nausea or vomiting or abdominal pain.  No fever or chills.  Pt  admitted w/ dx of acute saddle pulmonary embolism and subsequent acute respiratory failure with hypoxia per MD. Type of Study: Bedside Swallow Evaluation Previous Swallow Assessment: none Diet Prior to this Study: Regular;Thin liquids (Level 0) Temperature Spikes Noted: No (wbc 10.3) Respiratory Status: Nasal cannula (4L) History of Recent Intubation: No Behavior/Cognition: Alert;Cooperative;Pleasant mood;Distractible;Requires cueing (min) Oral Cavity Assessment: Within Functional Limits Oral Care Completed by SLP: Recent completion by staff Oral Cavity - Dentition: Adequate natural dentition Vision: Functional for self-feeding Self-Feeding  Abilities: Able to feed self;Needs set up Patient Positioning: Upright in bed (min support) Baseline Vocal Quality: Normal Volitional Cough: Strong Volitional Swallow: Able to elicit    Oral/Motor/Sensory Function Overall Oral Motor/Sensory Function: Within functional limits   Ice Chips Ice chips: Not tested   Thin Liquid Thin Liquid: Within functional limits Presentation: Self Fed;Straw (7-8 trials)    Nectar Thick Nectar Thick Liquid: Not tested   Honey Thick Honey Thick Liquid: Not tested   Puree Puree: Not tested   Solid     Solid: Within functional limits Presentation: Self Fed;Spoon (8 trials of Salad(chicken, lettuce))        Comer Portugal, MS, CCC-SLP Speech Language Pathologist Rehab Services; [facility] [place] [phone] (ascom) Kizzi Overbey 06/12/2024,5:40 PM

## 2024-06-12 NOTE — Consult Note (Signed)
 Palliative Medicine [facility] at [facility] [phone] [phone]   Name: Julie Mcbride Date: 06/12/2024 MRN: [id number]  DOB: [birth year]  Patient Care Team: Alla Amis, MD as PCP - General (Family Medicine) Verdene Gills, RN as Oncology Nurse Navigator Jacobo, Evalene PARAS, MD as Consulting Physician (Oncology)    REASON FOR CONSULTATION: Julie Mcbride is a 88 y.o. 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  Patient with acute hypoxic respiratory failure and right heart strain.  Underwent mechanical thrombectomy.  Palliative care consulted to address goals.  SOCIAL HISTORY:     reports that she has never smoked. She has never used smokeless tobacco. She reports that she does not drink alcohol and does not use drugs.  Patient is married and lives at home with her husband.  She has 2 sons and a daughter who live nearby.  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  ADVANCE DIRECTIVES:  On file  CODE STATUS: DNR  PAST MEDICAL HISTORY: Past Medical History:  Diagnosis Date   Allergy    Anemia    Arthritis    Knees, hands   Basal cell carcinoma    back - removed   Bladder polyp    With previos hematuria   Diverticulitis    Dyspnea    Dyspnea on exertion    GERD (gastroesophageal reflux disease)    History of ETT    Myoview 7/10 EF 78%, no ischemia or infarction. Poor exercise tolerance (2'30, stopped due to fatigue). Reached 86% MPHR.   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 IVAR Mori   BACK SURGERY     BASAL CELL CARCINOMA EXCISION     back   BREAST BIOPSY Right 06/19/2017   Affirm Bx- Radial Scar   BREAST EXCISIONAL BIOPSY Right 1996   neg   BREAST LUMPECTOMY WITH NEEDLE LOCALIZATION Right 08/11/2017   Procedure: BREAST LUMPECTOMY WITH NEEDLE LOCALIZATION;  Surgeon: Claudene Larinda Bolder, MD;  Location: [facility];  Service: General;  Laterality: Right;   CARPAL TUNNEL RELEASE Right    CATARACT EXTRACTION W/ INTRAOCULAR LENS  IMPLANT, BILATERAL     EXCISION OF BREAST BIOPSY Right 08/11/2017   excision of radial Scar   EYE SURGERY     JOINT REPLACEMENT Right    knee   KNEE ARTHROSCOPY Right 04/22/2016   Procedure: ARTHROSCOPY KNEE;  Surgeon: Helayne Glenn, MD;  Location: [facility];  Service: Orthopedics;  Laterality: Right;   knee replacement Right 01/11/2017   NASAL SINUS SURGERY  06/05/14   Dr. Milissa   POSTERIOR CERVICAL LAMINECTOMY  2005   C5-7; plate and screws   PULMONARY THROMBECTOMY Bilateral 06/11/2024   Procedure: PULMONARY THROMBECTOMY;  Surgeon: Jama Cordella MATSU, MD;  Location: [facility];  Service: Cardiovascular;  Laterality: Bilateral;  RENAL ANGIOGRAPHY Left 04/20/2020   Procedure: RENAL ANGIOGRAPHY;  Surgeon: Marea Selinda RAMAN, MD;  Location: [facility];  Service: Cardiovascular;  Laterality: Left;   TONSILLECTOMY     VIDEO BRONCHOSCOPY WITH ENDOBRONCHIAL NAVIGATION N/A 05/24/2024   Procedure: VIDEO BRONCHOSCOPY WITH ENDOBRONCHIAL NAVIGATION;  Surgeon: Parris Manna, MD;  Location: [facility];  Service: Thoracic;  Laterality: N/A;   VIDEO BRONCHOSCOPY WITH ENDOBRONCHIAL ULTRASOUND N/A 05/24/2024   Procedure: BRONCHOSCOPY, WITH EBUS;  Surgeon: Parris Manna, MD;  Location: [facility];  Service: Thoracic;  Laterality: N/A;     HEMATOLOGY/ONCOLOGY HISTORY:  Oncology History  Adenocarcinoma, lung, unspecified laterality (HCC)  05/10/2024 Initial Diagnosis   Adenocarcinoma, lung, unspecified laterality (HCC)   05/30/2024 Cancer Staging   Staging form: Lung, AJCC V9 - Clinical stage from 05/30/2024: Stage IVB (cT1b, cN3, cM1c2) - Signed by Jacobo Evalene PARAS, MD on 05/30/2024     ALLERGIES:  is allergic to chlorhexidine , cyclobenzaprine hcl, etodolac, lisinopril, meloxicam, metaxalone, neomycin-bacitracin zn-polymyx, nifedipine, nitrofurantoin, sulfamethoxazole-trimethoprim, sulfonamide derivatives, tizanidine, vasotec [enalapril maleate], ibuprofen, and tape.  MEDICATIONS:  Current Facility-Administered Medications  Medication Dose Route Frequency Provider Last Rate Last Admin   acetaminophen  (TYLENOL ) tablet 650 mg  650 mg Oral Q6H PRN Schnier, Cordella MATSU, MD       Or   acetaminophen  (TYLENOL ) suppository 650 mg  650 mg Rectal Q6H PRN Schnier, Gregory G, MD       apixaban  (ELIQUIS ) tablet 5 mg  5 mg Oral BID Pace, Brien R, NP       [START ON 06/13/2024] aspirin  EC tablet 81 mg  81 mg Oral Daily Pace, Brien R, NP       atorvastatin  (LIPITOR) tablet 10 mg  10 mg Oral Daily Schnier, Gregory G, MD       cholecalciferol  (VITAMIN D3) 25 MCG (1000 UNIT) tablet 4,000 Units  4,000 Units Oral Daily Schnier, Gregory G, MD   4,000 Units at 06/12/24 9061   fentaNYL  (SUBLIMAZE ) injection 50 mcg  50 mcg Intravenous Q2H PRN Schnier, Gregory G, MD   50 mcg at 06/12/24 1202   fluticasone  (FLONASE ) 50 MCG/ACT nasal spray 2 spray  2 spray Each Nare BID PRN Schnier, Gregory G, MD       lidocaine  (LIDODERM ) 5 % 1 patch  1 patch Transdermal Q24H Schnier, Gregory G, MD   1 patch at 06/11/24 0831   loratadine  (CLARITIN ) tablet 10 mg  10 mg Oral Daily Schnier, Gregory G, MD   10 mg at 06/12/24 9068   magnesium  hydroxide (MILK OF MAGNESIA) suspension 30 mL  30 mL Oral Daily PRN Schnier, Gregory G, MD       methocarbamol  (ROBAXIN ) tablet 500  mg  500 mg Oral Q6H PRN Schnier, Gregory G, MD   500 mg at 06/12/24 1007   multivitamin with minerals tablet 1 tablet  1 tablet Oral Daily Schnier, Cordella MATSU, MD       ondansetron  (ZOFRAN ) tablet 4 mg  4 mg Oral Q6H PRN Schnier, Cordella MATSU, MD       Or   ondansetron  (ZOFRAN ) injection 4 mg  4 mg Intravenous Q6H PRN Schnier, Gregory G, MD       oxyCODONE -acetaminophen  (PERCOCET/ROXICET) 5-325 MG per tablet 1 tablet  1 tablet Oral Q4H PRN Schnier, Gregory G, MD   1 tablet at 06/12/24 9072   rOPINIRole  (REQUIP ) tablet 0.5 mg  0.5 mg Oral TID Schnier, Gregory G, MD   0.5 mg at 06/12/24 0934   sodium chloride  flush (NS)  0.9 % injection 3 mL  3 mL Intravenous Once Schnier, Gregory G, MD       traZODone  (DESYREL ) tablet 25 mg  25 mg Oral QHS PRN Schnier, Cordella MATSU, MD        VITAL SIGNS: BP 134/60 (BP Location: Left Arm)   Pulse 96   Temp 98 F (36.7 C) (Oral)   Resp (!) 21   Ht 5' 6 (1.676 m)   Wt 213 lb 10 oz (96.9 kg)   SpO2 95%   BMI 34.48 kg/m  Filed Weights   06/11/24 1044 06/11/24 1425  Weight: 210 lb 15.7 oz (95.7 kg) 213 lb 10 oz (96.9 kg)    Estimated body mass index is 34.48 kg/m as calculated from the following:   Height as of this encounter: 5' 6 (1.676 m).   Weight as of this encounter: 213 lb 10 oz (96.9 kg).  LABS: CBC:    Component Value Date/Time   WBC 10.3 06/12/2024 0730   HGB 9.4 (L) 06/12/2024 0730   HGB 10.9 (L) 09/13/2013 0633   HCT 29.0 (L) 06/12/2024 0730   HCT 32.5 (L) 09/13/2013 0633   PLT 144 (L) 06/12/2024 0730   PLT 158 09/13/2013 0633   MCV 92.4 06/12/2024 0730   MCV 90 09/13/2013 0633   NEUTROABS 7.7 05/10/2024 1057   NEUTROABS 10.6 (H) 09/13/2013 0633   LYMPHSABS 1.6 05/10/2024 1057   LYMPHSABS 2.6 09/13/2013 0633   MONOABS 0.8 05/10/2024 1057   MONOABS 1.1 (H) 09/13/2013 0633   EOSABS 0.1 05/10/2024 1057   EOSABS 0.0 09/13/2013 0633   BASOSABS 0.0 05/10/2024 1057   BASOSABS 0.0 09/13/2013 0633   Comprehensive Metabolic Panel:     Component Value Date/Time   NA 136 06/11/2024 0424   NA 140 09/11/2013 0517   K 4.3 06/11/2024 0424   K 3.7 09/11/2013 0517   CL 101 06/11/2024 0424   CL 108 (H) 09/11/2013 0517   CO2 26 06/11/2024 0424   CO2 26 09/11/2013 0517   BUN 31 (H) 06/11/2024 0424   BUN 26 (H) 09/11/2013 0517   CREATININE 1.27 (H) 06/11/2024 0424   CREATININE 1.14 09/11/2013 0517   GLUCOSE 146 (H) 06/11/2024 0424   GLUCOSE 138 (H) 09/11/2013 0517   CALCIUM  9.1 06/11/2024 0424   CALCIUM  8.9 09/11/2013 0517   AST 28 05/10/2024 1057   AST 23 09/08/2013 0127   ALT 22 05/10/2024 1057   ALT 24 09/08/2013 0127   ALKPHOS 77 05/10/2024 1057   ALKPHOS 73 09/08/2013 0127   BILITOT 1.0 05/10/2024 1057   BILITOT 0.3 09/08/2013 0127   PROT 6.8 05/10/2024 1057   PROT 7.3 09/08/2013 0127   ALBUMIN 3.8 05/10/2024 1057   ALBUMIN 3.5 09/08/2013 0127    RADIOGRAPHIC STUDIES: ECHOCARDIOGRAM COMPLETE Result Date: 06/11/2024    ECHOCARDIOGRAM REPORT   Patient Name:   ELLAREE GEAR Date of Exam: 06/11/2024 Medical Rec #:  [id number]        Height:       65.0 in Accession #:    [id number]       Weight:       210.9 lb Date of Birth:  [birth year]       BSA:          2.023 m Patient Age:    87 years         BP:           113/66 mmHg Patient Gender: F  HR:           91 bpm. Exam Location:  [facility] Procedure: 2D Echo, Cardiac Doppler and Color Doppler (Both Spectral and Color            Flow Doppler were utilized during procedure). Indications:     pulmonary embolus I26.09  History:         Patient has prior history of Echocardiogram examinations, most                  recent 12/26/2020. Previous Myocardial Infarction,                  Signs/Symptoms:Dyspnea; Risk Factors:Hypertension.  Sonographer:     Christopher Furnace Referring Phys:  [id number] JAN A MANSY Diagnosing Phys: Lonni Hanson MD IMPRESSIONS  1. Left ventricular ejection fraction, by estimation, is 65 to 70%. The left ventricle has normal function. The left ventricle  has no regional wall motion abnormalities. There is moderate left ventricular hypertrophy. Left ventricular diastolic parameters are indeterminate. There is the interventricular septum is flattened in diastole ('D' shaped left ventricle), consistent with right ventricular volume overload.  2. Pulmonary artery pressure is mildly elevated (RVSP 35 mmHg plus central venous/right atrial pressure). Right ventricular systolic function is moderately reduced. The right ventricular size is moderately enlarged.  3. The mitral valve is degenerative. Trivial mitral valve regurgitation. No evidence of mitral stenosis.  4. The aortic valve is tricuspid. Aortic valve regurgitation is not visualized. No aortic stenosis is present. FINDINGS  Left Ventricle: Left ventricular ejection fraction, by estimation, is 65 to 70%. The left ventricle has normal function. The left ventricle has no regional wall motion abnormalities. The left ventricular internal cavity size was normal in size. There is  moderate left ventricular hypertrophy. The interventricular septum is flattened in diastole ('D' shaped left ventricle), consistent with right ventricular volume overload. Left ventricular diastolic parameters are indeterminate. Right Ventricle: Pulmonary artery pressure is mildly elevated (RVSP 35 mmHg plus central venous/right atrial pressure). The right ventricular size is moderately enlarged. No increase in right ventricular wall thickness. Right ventricular systolic function is moderately reduced. Left Atrium: Left atrial size was normal in size. Right Atrium: Right atrial size was normal in size. Pericardium: There is no evidence of pericardial effusion. Mitral Valve: The mitral valve is degenerative in appearance. There is mild thickening of the mitral valve leaflet(s). Mild mitral annular calcification. Trivial mitral valve regurgitation. No evidence of mitral valve stenosis. Tricuspid Valve: The tricuspid valve is normal in structure.  Tricuspid valve regurgitation is mild. Aortic Valve: The aortic valve is tricuspid. Aortic valve regurgitation is not visualized. No aortic stenosis is present. Aortic valve mean gradient measures 3.0 mmHg. Aortic valve peak gradient measures 5.2 mmHg. Aortic valve area, by VTI measures 2.53 cm. Pulmonic Valve: The pulmonic valve was not well visualized. Pulmonic valve regurgitation is not visualized. No evidence of pulmonic stenosis. Aorta: The aortic root is normal in size and structure. Pulmonary Artery: The pulmonary artery is not well seen. Venous: The inferior vena cava was not well visualized. IAS/Shunts: The interatrial septum was not well visualized.  LEFT VENTRICLE PLAX 2D LVIDd:         3.30 cm   Diastology LVIDs:         2.20 cm   LV e' medial:    4.79 cm/s LV PW:         1.20 cm   LV E/e' medial:  15.6 LV IVS:  1.38 cm   LV e' lateral:   11.50 cm/s LVOT diam:     2.00 cm   LV E/e' lateral: 6.5 LV SV:         44 LV SV Index:   22 LVOT Area:     3.14 cm  RIGHT VENTRICLE RV Basal diam:  4.00 cm RV Mid diam:    4.00 cm LEFT ATRIUM             Index        RIGHT ATRIUM           Index LA diam:        3.00 cm 1.48 cm/m   RA Area:     17.90 cm LA Vol (A2C):   42.0 ml 20.76 ml/m  RA Volume:   51.30 ml  25.36 ml/m LA Vol (A4C):   18.7 ml 9.24 ml/m LA Biplane Vol: 29.2 ml 14.43 ml/m  AORTIC VALVE AV Area (Vmax):    2.51 cm AV Area (Vmean):   2.20 cm AV Area (VTI):     2.53 cm AV Vmax:           114.00 cm/s AV Vmean:          88.200 cm/s AV VTI:            0.174 m AV Peak Grad:      5.2 mmHg AV Mean Grad:      3.0 mmHg LVOT Vmax:         91.00 cm/s LVOT Vmean:        61.700 cm/s LVOT VTI:          0.140 m LVOT/AV VTI ratio: 0.80  AORTA Ao Root diam: 3.20 cm MITRAL VALVE                TRICUSPID VALVE MV Area (PHT): 5.97 cm     TR Peak grad:   34.8 mmHg MV Decel Time: 127 msec     TR Vmax:        295.00 cm/s MV E velocity: 74.90 cm/s MV A velocity: 125.00 cm/s  SHUNTS MV E/A ratio:  0.60          Systemic VTI:  0.14 m                             Systemic Diam: 2.00 cm Lonni Hanson MD Electronically signed by Lonni Hanson MD Signature Date/Time: 06/11/2024/3:14:06 PM    Final    PERIPHERAL VASCULAR CATHETERIZATION Result Date: 06/11/2024 See surgical note for result.  US  Venous Img Lower Bilateral (DVT) Result Date: 06/11/2024 CLINICAL DATA:  Patient with pulmonary embolism.  Evaluate for DVT. EXAM: BILATERAL LOWER EXTREMITY VENOUS DOPPLER ULTRASOUND TECHNIQUE: Gray-scale sonography with graded compression, as well as color Doppler and duplex ultrasound were performed to evaluate the lower extremity deep venous systems from the level of the common femoral vein and including the common femoral, femoral, profunda femoral, popliteal and calf veins including the posterior tibial, peroneal and gastrocnemius veins when visible. The superficial great saphenous vein was also interrogated. Spectral Doppler was utilized to evaluate flow at rest and with distal augmentation maneuvers in the common femoral, femoral and popliteal veins. COMPARISON:  None Available. FINDINGS: RIGHT LOWER EXTREMITY Common Femoral Vein: No evidence of thrombus. Normal compressibility, respiratory phasicity and response to augmentation. Saphenofemoral Junction: No evidence of thrombus. Normal compressibility and flow on color Doppler imaging. Profunda Femoral Vein: No evidence of  thrombus. Normal compressibility and flow on color Doppler imaging. Femoral Vein: Examination is positive for nonocclusive thrombus within the proximal right femoral vein. Popliteal Vein: No evidence of thrombus. Normal compressibility, respiratory phasicity and response to augmentation. Calf Veins: No evidence of thrombus. Normal compressibility and flow on color Doppler imaging. Superficial Great Saphenous Vein: No evidence of thrombus. Normal compressibility. Venous Reflux:  None. Other Findings:  None. LEFT LOWER EXTREMITY Common Femoral Vein: No  evidence of thrombus. Normal compressibility, respiratory phasicity and response to augmentation. Saphenofemoral Junction: No evidence of thrombus. Normal compressibility and flow on color Doppler imaging. Profunda Femoral Vein: No evidence of thrombus. Normal compressibility and flow on color Doppler imaging. Femoral Vein: No evidence of thrombus. Normal compressibility, respiratory phasicity and response to augmentation. Popliteal Vein: No evidence of thrombus. Normal compressibility, respiratory phasicity and response to augmentation. Calf Veins: No evidence of thrombus. Normal compressibility and flow on color Doppler imaging. Superficial Great Saphenous Vein: No evidence of thrombus. Normal compressibility. Venous Reflux:  None. Other Findings:  None. IMPRESSION: 1. Examination is positive for nonocclusive thrombus within the proximal right femoral vein. 2. No evidence for left lower extremity deep venous thrombosis. Electronically Signed   By: Waddell Calk M.D.   On: 06/11/2024 06:39   CT Angio Chest PE W and/or Wo Contrast Result Date: 06/11/2024 CLINICAL DATA:  Shortness of breath and left-sided chest pain, known history of lung carcinoma EXAM: CT ANGIOGRAPHY CHEST WITH CONTRAST TECHNIQUE: Multidetector CT imaging of the chest was performed using the standard protocol during bolus administration of intravenous contrast. Multiplanar CT image reconstructions and MIPs were obtained to evaluate the vascular anatomy. RADIATION DOSE REDUCTION: This exam was performed according to the departmental dose-optimization program which includes automated exposure control, adjustment of the mA and/or kV according to patient size and/or use of iterative reconstruction technique. CONTRAST:  75mL OMNIPAQUE  IOHEXOL  350 MG/ML SOLN COMPARISON:  05/24/2024 FINDINGS: Cardiovascular: Atherosclerotic calcifications of the thoracic aorta are noted. No aneurysmal dilatation or dissection is seen. The pulmonary artery shows a  normal branching pattern bilaterally. Extensive pulmonary emboli are noted right greater than left. There are changes consistent with right heart strain with an RV/LV ratio of 1.5. Mediastinum/Nodes: Thoracic inlet is within normal limits. No hilar or mediastinal adenopathy is noted. The esophagus as visualized is within normal limits. Sliding-type hiatal hernia is seen. Lungs/Pleura: Small left effusion is noted. Scattered nodular densities are seen similar to that noted on prior exam consistent with metastatic disease. The largest index lesion in the left lower lobe measures approximately 17 mm in greatest dimension stable from the prior study. Similar findings are noted within the right lung although a large pleural effusion is noted increased when compared with the prior exam. Index lesion in the right lower lobe measures 17 mm also stable from the prior exam. Upper Abdomen: Visualized upper abdomen shows stable cyst in the left lobe of the liver. Musculoskeletal: Degenerative changes of the thoracic spine are noted. No compression deformity is noted. Review of the MIP images confirms the above findings. IMPRESSION: Extensive bilateral pulmonary emboli with evidence of right heart strain. The RV/LV ratio measures 1.5. Scattered pulmonary nodules again identified consistent with metastatic disease. The overall appearance is similar to that seen on the recent exam. Aortic Atherosclerosis (ICD10-I70.0). Critical Value/emergent results were called by telephone at the time of interpretation on 06/11/2024 at 1:24 am to Dr. KEVIN PADUCHOWSKI , who verbally acknowledged these results. Electronically Signed   By: Oneil Evelyne HERO.D.  On: 06/11/2024 01:26   DG Chest Port 1 View Result Date: 06/10/2024 CLINICAL DATA:  Shortness of breath EXAM: PORTABLE CHEST 1 VIEW COMPARISON:  Chest x-ray 05/24/2024.  CT of the chest 05/24/2024. FINDINGS: Heart is enlarged. There are small bilateral pleural effusions. There scattered  bilateral patchy airspace opacities in both lungs increasing in the left upper lobe. Scattered nodular densities are grossly unchanged. No pneumothorax or acute fracture identified. IMPRESSION: 1. Scattered bilateral patchy airspace opacities in both lungs increasing in the left upper lobe. Findings may represent edema or infection. 2. Small bilateral pleural effusions. 3. Stable scattered nodular densities. 4. Cardiomegaly. Electronically Signed   By: Greig Pique M.D.   On: 06/10/2024 23:14   CT CHEST WO CONTRAST Result Date: 06/02/2024 CLINICAL DATA:  Pulmonary nodules EXAM: CT CHEST WITHOUT CONTRAST TECHNIQUE: Multidetector CT imaging of the chest was performed following the standard protocol without IV contrast. RADIATION DOSE REDUCTION: This exam was performed according to the departmental dose-optimization program which includes automated exposure control, adjustment of the mA and/or kV according to patient size and/or use of iterative reconstruction technique. COMPARISON:  05/21/2024 PET-CT FINDINGS: Cardiovascular: Atheromatous vascular calcification of the thoracic aorta branch vessels. Mild mitral valve calcification. Mediastinum/Nodes: Small to moderate-sized hiatal hernia. Scattered small subpectoral and axillary lymph nodes are not pathologically enlarged. There is evidence of right hilar and low right paratracheal adenopathy as shown on recent PET-CT. Index right lower paratracheal node 1.1 cm in short axis on image 53 series 2, formerly the same on 05/10/2024. Lungs/Pleura: Numerous scattered lung nodules are again identified. Index nodule in the superior segment right lower lobe was previously hypermetabolic root A's prior to this exam and measures 1.1 by 0.8 cm on image 52 series 4. A left lower lobe nodule measuring 1.8 by 1.1 cm on image 87 of series 4 was previously hypermetabolic on recent PET-CT and similar size. At least 20 other irregular pulmonary nodules are identified scattered in the  lungs. Stable small right and trace left pleural effusion. Upper Abdomen: Fluid density hepatic cysts. Abdominal aortic atherosclerosis. Left renal artery stent. Indistinct nodularity along the left adrenal gland is nonspecific but was not recently hypermetabolic and may well be incidental. The hypermetabolic liver lesions seen on recent PET-CT are not readily visible on today's noncontrast CT examination. Musculoskeletal: The patient has known skeletal hypermetabolic lesions including a lesion in the left humerus not well characterized on this exam. Thoracic kyphosis and spondylosis. Lower cervical plate and screw fixator. IMPRESSION: 1. Numerous scattered lung nodules are again identified, compatible with metastatic disease particularly given that many were hypermetabolic on recent PET-CT. 2. Right hilar and low right paratracheal adenopathy is again identified, favoring malignancy. 3. The hypermetabolic liver lesions seen on recent PET-CT are not readily visible on today's noncontrast CT examination. 4. The patient has known skeletal hypermetabolic lesions including a lesion in the left humerus not well characterized on this exam. 5. Stable small right and trace left pleural effusion. 6. Small to moderate-sized hiatal hernia. 7.  Aortic Atherosclerosis (ICD10-I70.0). Electronically Signed   By: Ryan Salvage M.D.   On: 06/02/2024 17:16   DG Chest Port 1 View Result Date: 05/24/2024 CLINICAL DATA:  [id number] S/P bronchoscopy [id number] EXAM: PORTABLE CHEST - 1 VIEW COMPARISON:  May 21, 2024, May 10, 2024 FINDINGS: Lower lung volumes. Bilateral perihilar interstitial opacities. Blunting of the right costophrenic sulcus. No pneumothorax. The lung nodules on the prior CT are not well evaluated by plain radiography. No cardiomegaly. Aortic atherosclerosis. No  acute fracture or destructive lesions. Multilevel thoracic osteophytosis. Cervical fusion hardware. IMPRESSION: 1. Low lung volumes with bilateral perihilar  interstitial opacities, which may represent bronchovascular crowding or interstitial edema. Trace right pleural effusion. No pneumothorax. 2. The bilateral pulmonary nodules on the prior CT are not well evaluated by plain radiography. Electronically Signed   By: Rogelia Myers M.D.   On: 05/24/2024 16:14   DG C-Arm 1-60 Min-No Report Result Date: 05/24/2024 Fluoroscopy was utilized by the requesting physician.  No radiographic interpretation.   DG C-Arm 1-60 Min-No Report Result Date: 05/24/2024 Fluoroscopy was utilized by the requesting physician.  No radiographic interpretation.   NM PET Image Initial (PI) Skull Base To Thigh Addendum Date: 05/23/2024 ADDENDUM REPORT: 05/23/2024 12:28 ADDENDUM: Additional impression. On the very first image is a slight area of asymmetric uptake along the right frontal region of intracranial compartment. This could be technical. In light of the other findings however if there is further concern additional MRI may be useful to exclude underlying lesion. Electronically Signed   By: Ranell Bring M.D.   On: 05/23/2024 12:28   Result Date: 05/23/2024 CLINICAL DATA:  Initial treatment strategy for multiple lung nodules. EXAM: NUCLEAR MEDICINE PET SKULL BASE TO THIGH TECHNIQUE: 10.15 mCi F-18 FDG was injected intravenously. Full-ring PET imaging was performed from the skull base to thigh after the radiotracer. CT data was obtained and used for attenuation correction and anatomic localization. Fasting blood glucose: 133 mg/dl COMPARISON:  CTA chest 05/10/2024 FINDINGS: Mediastinal blood pool activity: SUV max 3.1 Liver activity: SUV max 3.9 NECK: No specific abnormal uptake seen in the neck including along lymph node chains of the submandibular, posterior triangle or internal jugular regions. Slight asymmetric area of uptake along the right frontal region, this could be a physiologic variant. Incidental CT findings: Nasal septal deviation identified. Small rounded opacity along  the right maxillary sinus, small mucous retention cyst or polyp. Visualized portions of the mastoid air cells clear. Streak artifact related to the patient's dental hardware and cervical spine fixation hardware. Small thyroid gland. The submandibular glands and parotid glands are preserved. CHEST: As seen on the recent CT angiogram of the chest from 05/10/2024 there are numerous scattered bilateral lung nodules identified. Many of which show abnormal uptake. There is some limitation today with motion. Lesion seen in the left lower lobe on the prior examination which would have measured 15 by 12 mm, today is seen on image 54 and has some uptake of maximum SUV 4.2. Lesion seen in the anterior right middle lobe maximum SUV of 6.6 is identified on image 47, measuring 12 mm. Several lesions identified do not show significant abnormal uptake. Some of this could relate to also small lesions are versus motion. There are several hypermetabolic lymph nodes identified in the right axillary region. Maximum SUV value of 8.5. Is also mediastinal nodes including a precarinal lesion which has maximum SUV value 6.1 and measures on image 40 in short axis of 10 mm. Similar size to the recent prior. Is also mild uptake along the left hilum. No abnormal uptake along the axillary regions. Incidental CT findings: Persistent small right pleural effusion. There is slight increase in tiny left pleural effusion. Moderate hiatal hernia. Scattered vascular calcifications are identified. No pericardial effusion. ABDOMEN/PELVIS: There are 3 hypermetabolic lesions identified. Example in segment 4A on image 57 has maximum SUV value of 8.2. Focus seen towards the caudate lobe on image 66 has maximum SUV value of 9.2. There lesion is seen in  segment 3 with maximum SUV value 10.3 on image 74. These lesions are not well seen on the CT scan. Otherwise there is physiologic distribution radiotracer along the parenchymal organs, bowel and renal collecting  systems. No abnormal nodal uptake identified in the abdomen and pelvis. Incidental CT findings: Significant motion. Benign separate hepatic cystic lesions are identified. Gallbladder is nondilated. Grossly the spleen, adrenal glands and pancreas are unremarkable. Mild bilateral renal atrophy. Calcifications along the right kidney. Contracted urinary bladder. Large bowel has a normal course and caliber with scattered colonic stool and colonic diverticula. Small bowel is nondilated. Note is made of a left renal artery stent. SKELETON: Multifocal areas of abnormal bony uptake consistent bone metastases. These include the left femoral neck, the left ischial bone, bilateral iliac bones, bilateral ribs and the left humeral head. Example left humeral head lesion has sclerosis on image 21 and maximum SUV value of 11.0. Left superior iliac crest lesion also sclerosis on CT image 95 and maximum SUV of 6.6. Left femoral neck lesion again with sclerosis on image 133 has maximum SUV of 9.2. of note there is also a a photopenic appearance to the T8 vertebral body with some surrounding mild increased uptake. Please correlate with spine CT scan of 05/10/2024. Incidental CT findings: Curvature of the spine. Diffuse degenerative changes identified. IMPRESSION: Multifocal hypermetabolic metastatic disease. Several lesions scattered throughout the skeleton. Multiple lung nodules are identified as on prior examination. Some of these do not show significant uptake while others show clear abnormal uptake. There is significant breathing motion. Abnormal mediastinal and right hilar lymph nodes. At least 3 hypermetabolic liver lesions. If needed additional workup with liver MRI as clinically appropriate. Electronically Signed: By: Ranell Bring M.D. On: 05/23/2024 11:48    PERFORMANCE STATUS (ECOG) : 1 - Symptomatic but completely ambulatory  Review of Systems Unless otherwise noted, a complete review of systems is negative.  Physical  Exam General: NAD Cardiovascular: regular rate and rhythm Pulmonary: clear ant fields Abdomen: soft, nontender, + bowel sounds GU: no suprapubic tenderness Extremities: no edema, no joint deformities Skin: no rashes Neurological: Weakness but otherwise nonfocal  IMPRESSION: Patient seen in the ICU.  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  She is pending MRI of the brain to complete workup.  Patient pending NGS testing to identify any actionable mutations.  She was admitted on 06/11/2024 with DVT/PE and underwent mechanical thrombectomy.  Today, patient remains in ICU.  She says that she is feeling better and less short of breath today.  She remains on oxygen.  Patient tearful as she described the emotional impact of her recent cancer diagnosis and current hospitalization.  She says that she had told the family that she just wished the good Lord would take me.  As she is feeling better now, she is in agreement with current scope of treatment.  Patient does appear to have a good grasp of her cancer diagnosis and plan for current workup.  We are awaiting NGS/PD-L1 testing to determine if patient would be a candidate for any nonsystemic chemotherapy options.  Patient makes it quite clear that she would not wish to undergo chemotherapy and would prioritize quality of life.  However, she may be willing to consider alternative options if any are available.  Will plan for outpatient follow-up.  PLAN: - Continue current scope of treatment - Will plan outpatient follow-up  Case and plan discussed with Dr. Jacobo   Time Total:  45 minutes  Visit consisted of counseling and education dealing with the complex and emotionally intense issues of symptom management and palliative care in the setting of serious and potentially life-threatening illness.Greater than 50%  of this time was spent counseling and coordinating care related to the above  assessment and plan.  Signed by: Fonda Mower, PhD, NP-C

## 2024-06-12 NOTE — Progress Notes (Signed)
 PROGRESS NOTE    Julie Mcbride  FMW:[id number] DOB: 10/16/1936 DOA: 06/10/2024 PCP: Alla Amis, MD  Outpatient Specialists: oncology, cardiology, pulmonology    Brief Narrative:   From admission h and p  Julie Mcbride is a 88 y.o. Caucasian female with medical history significant for recently diagnosed stage IV lung cancer, followed by Dr. Jacobo, osteoarthritis, diverticulitis, GERD, essential hypertension, Alzheimer's dementia and MI who presented to the emergency room with acute onset of shortness of breath after going to the bathroom with associated left sided chest pain worsening with deep breathing as well as occasional wheezing.  She denied any nausea or vomiting or abdominal pain.  No fever or chills.  No leg pain or edema or recent travels or surgeries.    Assessment & Plan:   Principal Problem:   Acute saddle pulmonary embolism (HCC) Active Problems:   Dyslipidemia   Essential hypertension   Ischemic cardiomyopathy   CKD stage 3a, GFR 45-59 ml/min (HCC)   Renal artery stenosis (HCC)   Adenocarcinoma, lung, unspecified laterality (HCC)   Class 2 obesity   Restless leg syndrome   CAD (coronary artery disease)   Dementia (HCC)  # Bilateral PE # Right femoral DVT Provoked by malignancy. With RV dysfunction, requiring O2. POD1 from mechanical thrombectomy and catheter placement. Remains symptomatic - continue heparin  for now  # Acute hypoxic respiratory failure 2/2 PE. On 4 liters and remains dyspneic - [place] O2, wean as able  # Ischemic cardiomyopathy # Right-sided heart failure Has history ischemic cardiomyopathy. EF normal on TTE here. TTE does show mod pHTN and mod RV systolic dysfunction, 2/2 PE. - hold home bisoprolol  and lasix   # Stabe 4 lung cancer Recently established with dr. Jacobo. Has staging MRI of brain scheduled for tomorrow - would plan on obtaining that MRI while inpatient once stable - outpt oncology f/u - will touch base w/  Dr. Jacobo  # CKD 3a Kidney function is stable  # Renal artery stenosis S/p stent, recent dopplers show patency - outpt vascular f/u - hold home plavix , vascular is considering transitioning to aspirin  at d/c  # HTN Here bp appropriate in setting of above - hold home meds  # Restless leg - home requip   # Obesity Noted  # Swallow dysfunction Relayed by patient to nurse - slp swallow eval  # Debility - PT consult tomorrow    DVT prophylaxis: therapeutic heparin  Code Status: dnr/dni Family Communication: husband and son updated @ bedside 7/16  Level of care: Stepdown Status is: Inpatient Remains inpatient appropriate because: severity of illness    Consultants:  vascular  Procedures: See above  Antimicrobials:  Peri-operative    Subjective: Reports ongoing pain and cath site and dyspnea  Objective: Vitals:   06/12/24 0829 06/12/24 0900 06/12/24 0930 06/12/24 1000  BP: (!) 113/54 (!) 115/59 (!) 126/49 (!) 107/48  Pulse: 87 93 92 91  Resp: 20 (!) 21 (!) 23 18  Temp:  (!) 97.3 F (36.3 C)    TempSrc:  Axillary    SpO2: 97% 96% 97% 97%  Weight:      Height:        Intake/Output Summary (Last 24 hours) at 06/12/2024 1007 Last data filed at 06/12/2024 0900 Gross per 24 hour  Intake 1142.01 ml  Output 270 ml  Net 872.01 ml   Filed Weights   06/11/24 1044 06/11/24 1425  Weight: 95.7 kg 96.9 kg    Examination:  General exam: Appears calm and comfortable  Respiratory system: few scattered wheeze, tachypneic Cardiovascular system: S1 & S2 heard, RRR. Soft systolic murmur Gastrointestinal system: Abdomen is obese, soft and nontender. No organomegaly or masses felt.   Central nervous system: Alert and oriented. No focal neurological deficits. Extremities: Symmetric 5 x 5 power. Right leg edema greater than left Skin: bandage over right groin Psychiatry: Judgement and insight appear normal. Mood & affect appropriate.     Data Reviewed: I have  personally reviewed following labs and imaging studies  CBC: Recent Labs  Lab 06/10/24 2252 06/11/24 0424 06/12/24 0730  WBC 11.1* 10.7* 10.3  HGB 10.9* 11.1* 9.4*  HCT 34.3* 33.9* 29.0*  MCV 93.2 92.1 92.4  PLT 171 153 144*   Basic Metabolic Panel: Recent Labs  Lab 06/10/24 2252 06/11/24 0424  NA 139 136  K 4.1 4.3  CL 101 101  CO2 25 26  GLUCOSE 160* 146*  BUN 34* 31*  CREATININE 1.48* 1.27*  CALCIUM  9.2 9.1   GFR: Estimated Creatinine Clearance: 38 mL/min (A) (by C-G formula based on SCr of 1.27 mg/dL (H)). Liver Function Tests: No results for input(s): AST, ALT, ALKPHOS, BILITOT, PROT, ALBUMIN in the last 168 hours. No results for input(s): LIPASE, AMYLASE in the last 168 hours. No results for input(s): AMMONIA in the last 168 hours. Coagulation Profile: Recent Labs  Lab 06/10/24 2252  INR 1.0   Cardiac Enzymes: No results for input(s): CKTOTAL, CKMB, CKMBINDEX, TROPONINI in the last 168 hours. BNP (last 3 results) No results for input(s): PROBNP in the last 8760 hours. HbA1C: No results for input(s): HGBA1C in the last 72 hours. CBG: Recent Labs  Lab 06/11/24 1423  GLUCAP 108*   Lipid Profile: No results for input(s): CHOL, HDL, LDLCALC, TRIG, CHOLHDL, LDLDIRECT in the last 72 hours. Thyroid Function Tests: No results for input(s): TSH, T4TOTAL, FREET4, T3FREE, THYROIDAB in the last 72 hours. Anemia Panel: No results for input(s): VITAMINB12, FOLATE, FERRITIN, TIBC, IRON, RETICCTPCT in the last 72 hours. Urine analysis:    Component Value Date/Time   COLORURINE YELLOW (A) 05/12/2024 1306   APPEARANCEUR CLOUDY (A) 05/12/2024 1306   APPEARANCEUR Hazy (A) 08/18/2021 1452   LABSPEC 1.015 05/12/2024 1306   LABSPEC 1.021 09/08/2013 0127   PHURINE 5.0 05/12/2024 1306   GLUCOSEU NEGATIVE 05/12/2024 1306   GLUCOSEU Negative 09/08/2013 0127   HGBUR NEGATIVE 05/12/2024 1306   BILIRUBINUR  NEGATIVE 05/12/2024 1306   BILIRUBINUR Negative 08/18/2021 1452   BILIRUBINUR Negative 09/08/2013 0127   KETONESUR NEGATIVE 05/12/2024 1306   PROTEINUR NEGATIVE 05/12/2024 1306   NITRITE POSITIVE (A) 05/12/2024 1306   LEUKOCYTESUR LARGE (A) 05/12/2024 1306   LEUKOCYTESUR Negative 09/08/2013 0127   Sepsis Labs: @LABRCNTIP (procalcitonin:4,lacticidven:4)  ) Recent Results (from the past 240 hours)  MRSA Next Gen by PCR, Nasal     Status: None   Collection Time: 06/11/24  2:49 PM   Specimen: Nasal Mucosa; Nasal Swab  Result Value Ref Range Status   MRSA by PCR Next Gen NOT DETECTED NOT DETECTED Final    Comment: (NOTE) The GeneXpert MRSA Assay (FDA approved for NASAL specimens only), is one component of a comprehensive MRSA colonization surveillance program. It is not intended to diagnose MRSA infection nor to guide or monitor treatment for MRSA infections. Test performance is not FDA approved in patients less than 86 years old. Performed at [facility], [address] [place], [place] [address]          Radiology Studies: ECHOCARDIOGRAM COMPLETE Result Date: 06/11/2024  ECHOCARDIOGRAM REPORT   Patient Name:   Julie Mcbride Date of Exam: 06/11/2024 Medical Rec #:  [id number]        Height:       65.0 in Accession #:    [id number]       Weight:       210.9 lb Date of Birth:  10-06-1936       BSA:          2.023 m Patient Age:    87 years         BP:           113/66 mmHg Patient Gender: F                HR:           91 bpm. Exam Location:  [facility] Procedure: 2D Echo, Cardiac Doppler and Color Doppler (Both Spectral and Color            Flow Doppler were utilized during procedure). Indications:     pulmonary embolus I26.09  History:         Patient has prior history of Echocardiogram examinations, most                  recent 12/26/2020. Previous Myocardial Infarction,                  Signs/Symptoms:Dyspnea; Risk Factors:Hypertension.  Sonographer:     Christopher Furnace Referring  Phys:  [id number] JAN A MANSY Diagnosing Phys: Lonni Hanson MD IMPRESSIONS  1. Left ventricular ejection fraction, by estimation, is 65 to 70%. The left ventricle has normal function. The left ventricle has no regional wall motion abnormalities. There is moderate left ventricular hypertrophy. Left ventricular diastolic parameters are indeterminate. There is the interventricular septum is flattened in diastole ('D' shaped left ventricle), consistent with right ventricular volume overload.  2. Pulmonary artery pressure is mildly elevated (RVSP 35 mmHg plus central venous/right atrial pressure). Right ventricular systolic function is moderately reduced. The right ventricular size is moderately enlarged.  3. The mitral valve is degenerative. Trivial mitral valve regurgitation. No evidence of mitral stenosis.  4. The aortic valve is tricuspid. Aortic valve regurgitation is not visualized. No aortic stenosis is present. FINDINGS  Left Ventricle: Left ventricular ejection fraction, by estimation, is 65 to 70%. The left ventricle has normal function. The left ventricle has no regional wall motion abnormalities. The left ventricular internal cavity size was normal in size. There is  moderate left ventricular hypertrophy. The interventricular septum is flattened in diastole ('D' shaped left ventricle), consistent with right ventricular volume overload. Left ventricular diastolic parameters are indeterminate. Right Ventricle: Pulmonary artery pressure is mildly elevated (RVSP 35 mmHg plus central venous/right atrial pressure). The right ventricular size is moderately enlarged. No increase in right ventricular wall thickness. Right ventricular systolic function is moderately reduced. Left Atrium: Left atrial size was normal in size. Right Atrium: Right atrial size was normal in size. Pericardium: There is no evidence of pericardial effusion. Mitral Valve: The mitral valve is degenerative in appearance. There is mild thickening  of the mitral valve leaflet(s). Mild mitral annular calcification. Trivial mitral valve regurgitation. No evidence of mitral valve stenosis. Tricuspid Valve: The tricuspid valve is normal in structure. Tricuspid valve regurgitation is mild. Aortic Valve: The aortic valve is tricuspid. Aortic valve regurgitation is not visualized. No aortic stenosis is present. Aortic valve mean gradient measures 3.0 mmHg. Aortic valve peak gradient measures 5.2 mmHg. Aortic valve area, by VTI  measures 2.53 cm. Pulmonic Valve: The pulmonic valve was not well visualized. Pulmonic valve regurgitation is not visualized. No evidence of pulmonic stenosis. Aorta: The aortic root is normal in size and structure. Pulmonary Artery: The pulmonary artery is not well seen. Venous: The inferior vena cava was not well visualized. IAS/Shunts: The interatrial septum was not well visualized.  LEFT VENTRICLE PLAX 2D LVIDd:         3.30 cm   Diastology LVIDs:         2.20 cm   LV e' medial:    4.79 cm/s LV PW:         1.20 cm   LV E/e' medial:  15.6 LV IVS:        1.38 cm   LV e' lateral:   11.50 cm/s LVOT diam:     2.00 cm   LV E/e' lateral: 6.5 LV SV:         44 LV SV Index:   22 LVOT Area:     3.14 cm  RIGHT VENTRICLE RV Basal diam:  4.00 cm RV Mid diam:    4.00 cm LEFT ATRIUM             Index        RIGHT ATRIUM           Index LA diam:        3.00 cm 1.48 cm/m   RA Area:     17.90 cm LA Vol (A2C):   42.0 ml 20.76 ml/m  RA Volume:   51.30 ml  25.36 ml/m LA Vol (A4C):   18.7 ml 9.24 ml/m LA Biplane Vol: 29.2 ml 14.43 ml/m  AORTIC VALVE AV Area (Vmax):    2.51 cm AV Area (Vmean):   2.20 cm AV Area (VTI):     2.53 cm AV Vmax:           114.00 cm/s AV Vmean:          88.200 cm/s AV VTI:            0.174 m AV Peak Grad:      5.2 mmHg AV Mean Grad:      3.0 mmHg LVOT Vmax:         91.00 cm/s LVOT Vmean:        61.700 cm/s LVOT VTI:          0.140 m LVOT/AV VTI ratio: 0.80  AORTA Ao Root diam: 3.20 cm MITRAL VALVE                TRICUSPID  VALVE MV Area (PHT): 5.97 cm     TR Peak grad:   34.8 mmHg MV Decel Time: 127 msec     TR Vmax:        295.00 cm/s MV E velocity: 74.90 cm/s MV A velocity: 125.00 cm/s  SHUNTS MV E/A ratio:  0.60         Systemic VTI:  0.14 m                             Systemic Diam: 2.00 cm Lonni Hanson MD Electronically signed by Lonni Hanson MD Signature Date/Time: 06/11/2024/3:14:06 PM    Final    PERIPHERAL VASCULAR CATHETERIZATION Result Date: 06/11/2024 See surgical note for result.  US  Venous Img Lower Bilateral (DVT) Result Date: 06/11/2024 CLINICAL DATA:  Patient with pulmonary embolism.  Evaluate for DVT. EXAM: BILATERAL LOWER EXTREMITY VENOUS DOPPLER ULTRASOUND  TECHNIQUE: Gray-scale sonography with graded compression, as well as color Doppler and duplex ultrasound were performed to evaluate the lower extremity deep venous systems from the level of the common femoral vein and including the common femoral, femoral, profunda femoral, popliteal and calf veins including the posterior tibial, peroneal and gastrocnemius veins when visible. The superficial great saphenous vein was also interrogated. Spectral Doppler was utilized to evaluate flow at rest and with distal augmentation maneuvers in the common femoral, femoral and popliteal veins. COMPARISON:  None Available. FINDINGS: RIGHT LOWER EXTREMITY Common Femoral Vein: No evidence of thrombus. Normal compressibility, respiratory phasicity and response to augmentation. Saphenofemoral Junction: No evidence of thrombus. Normal compressibility and flow on color Doppler imaging. Profunda Femoral Vein: No evidence of thrombus. Normal compressibility and flow on color Doppler imaging. Femoral Vein: Examination is positive for nonocclusive thrombus within the proximal right femoral vein. Popliteal Vein: No evidence of thrombus. Normal compressibility, respiratory phasicity and response to augmentation. Calf Veins: No evidence of thrombus. Normal compressibility and flow  on color Doppler imaging. Superficial Great Saphenous Vein: No evidence of thrombus. Normal compressibility. Venous Reflux:  None. Other Findings:  None. LEFT LOWER EXTREMITY Common Femoral Vein: No evidence of thrombus. Normal compressibility, respiratory phasicity and response to augmentation. Saphenofemoral Junction: No evidence of thrombus. Normal compressibility and flow on color Doppler imaging. Profunda Femoral Vein: No evidence of thrombus. Normal compressibility and flow on color Doppler imaging. Femoral Vein: No evidence of thrombus. Normal compressibility, respiratory phasicity and response to augmentation. Popliteal Vein: No evidence of thrombus. Normal compressibility, respiratory phasicity and response to augmentation. Calf Veins: No evidence of thrombus. Normal compressibility and flow on color Doppler imaging. Superficial Great Saphenous Vein: No evidence of thrombus. Normal compressibility. Venous Reflux:  None. Other Findings:  None. IMPRESSION: 1. Examination is positive for nonocclusive thrombus within the proximal right femoral vein. 2. No evidence for left lower extremity deep venous thrombosis. Electronically Signed   By: Waddell Calk M.D.   On: 06/11/2024 06:39   CT Angio Chest PE W and/or Wo Contrast Result Date: 06/11/2024 CLINICAL DATA:  Shortness of breath and left-sided chest pain, known history of lung carcinoma EXAM: CT ANGIOGRAPHY CHEST WITH CONTRAST TECHNIQUE: Multidetector CT imaging of the chest was performed using the standard protocol during bolus administration of intravenous contrast. Multiplanar CT image reconstructions and MIPs were obtained to evaluate the vascular anatomy. RADIATION DOSE REDUCTION: This exam was performed according to the departmental dose-optimization program which includes automated exposure control, adjustment of the mA and/or kV according to patient size and/or use of iterative reconstruction technique. CONTRAST:  75mL OMNIPAQUE  IOHEXOL  350 MG/ML  SOLN COMPARISON:  05/24/2024 FINDINGS: Cardiovascular: Atherosclerotic calcifications of the thoracic aorta are noted. No aneurysmal dilatation or dissection is seen. The pulmonary artery shows a normal branching pattern bilaterally. Extensive pulmonary emboli are noted right greater than left. There are changes consistent with right heart strain with an RV/LV ratio of 1.5. Mediastinum/Nodes: Thoracic inlet is within normal limits. No hilar or mediastinal adenopathy is noted. The esophagus as visualized is within normal limits. Sliding-type hiatal hernia is seen. Lungs/Pleura: Small left effusion is noted. Scattered nodular densities are seen similar to that noted on prior exam consistent with metastatic disease. The largest index lesion in the left lower lobe measures approximately 17 mm in greatest dimension stable from the prior study. Similar findings are noted within the right lung although a large pleural effusion is noted increased when compared with the prior exam. Index lesion in the right  lower lobe measures 17 mm also stable from the prior exam. Upper Abdomen: Visualized upper abdomen shows stable cyst in the left lobe of the liver. Musculoskeletal: Degenerative changes of the thoracic spine are noted. No compression deformity is noted. Review of the MIP images confirms the above findings. IMPRESSION: Extensive bilateral pulmonary emboli with evidence of right heart strain. The RV/LV ratio measures 1.5. Scattered pulmonary nodules again identified consistent with metastatic disease. The overall appearance is similar to that seen on the recent exam. Aortic Atherosclerosis (ICD10-I70.0). Critical Value/emergent results were called by telephone at the time of interpretation on 06/11/2024 at 1:24 am to Dr. KEVIN PADUCHOWSKI , who verbally acknowledged these results. Electronically Signed   By: Oneil Devonshire M.D.   On: 06/11/2024 01:26   DG Chest Port 1 View Result Date: 06/10/2024 CLINICAL DATA:  Shortness  of breath EXAM: PORTABLE CHEST 1 VIEW COMPARISON:  Chest x-ray 05/24/2024.  CT of the chest 05/24/2024. FINDINGS: Heart is enlarged. There are small bilateral pleural effusions. There scattered bilateral patchy airspace opacities in both lungs increasing in the left upper lobe. Scattered nodular densities are grossly unchanged. No pneumothorax or acute fracture identified. IMPRESSION: 1. Scattered bilateral patchy airspace opacities in both lungs increasing in the left upper lobe. Findings may represent edema or infection. 2. Small bilateral pleural effusions. 3. Stable scattered nodular densities. 4. Cardiomegaly. Electronically Signed   By: Greig Pique M.D.   On: 06/10/2024 23:14        Scheduled Meds:  atorvastatin   10 mg Oral Daily   bisoprolol   10 mg Oral Daily   And   hydrochlorothiazide   6.25 mg Oral Daily   cholecalciferol   4,000 Units Oral Daily   clopidogrel   75 mg Oral Daily   furosemide   20 mg Oral Daily   hydrALAZINE   50 mg Oral Q8H   lidocaine   1 patch Transdermal Q24H   loratadine   10 mg Oral Daily   losartan   50 mg Oral Daily   multivitamin with minerals  1 tablet Oral Daily   rOPINIRole   0.5 mg Oral TID   sodium chloride  flush  3 mL Intravenous Once   Continuous Infusions:  heparin  1,100 Units/hr (06/12/24 0900)     LOS: 1 day   CRITICAL CARE Performed by: Devaughn KATHEE Ban   Total critical care time: 54 minutes  Critical care time was exclusive of separately billable procedures and treating other patients.  Critical care was necessary to treat or prevent imminent or life-threatening deterioration.  Critical care was time spent personally by me on the following activities: development of treatment plan with patient and/or surrogate as well as nursing, discussions with consultants, evaluation of patient's response to treatment, examination of patient, obtaining history from patient or surrogate, ordering and performing treatments and interventions, ordering and  review of laboratory studies, ordering and review of radiographic studies, pulse oximetry and re-evaluation of patient's condition.    Devaughn KATHEE Ban, MD [facility]   If 7PM-7AM, please contact night-coverage [email or web address] Password [email or web address] 06/12/2024, 10:07 AM

## 2024-06-12 NOTE — Care Management Important Message (Signed)
 Important Message  Patient Details  Name: Julie Mcbride MRN: [id number] Date of Birth: 1936/09/08   Important Message Given:  Yes - Medicare IM     [name] 06/12/2024, 3:07 PM

## 2024-06-12 NOTE — Telephone Encounter (Signed)

## 2024-06-12 NOTE — Progress Notes (Signed)
 Progress Note    06/12/2024 12:30 PM 1 Day Post-Op  Subjective:  Julie Mcbride is an 88 yo female who is nor POD #1 from:  Procedure(s) Performed:             1.  Contrast injection right heart and bilateral pulmonary arteries             2.  Mechanical thrombectomy bilateral lobar pulmonary arteries for removal of pulmonary emboli using the Penumbra CAT 8 thrombectomy catheter.             3.  Selective catheter placement right upper lobe pulmonary artery, middle lobe pulmonary artery and lower lobe pulmonary artery             4.  Selective catheter placement left upper lobe pulmonary artery and lower lobe pulmonary artery  Patient is resting comfortably in bed in ICU this morning. She endorses pain generally but admits she is breathing better this morning. Patient remains on a heparin  infusion this morning. No complications overnight and vitals all remain stable.     Vitals:   06/12/24 1100 06/12/24 1201  BP: (!) 99/48 134/60  Pulse: 79 96  Resp: 18 (!) 21  Temp:  98 F (36.7 C)  SpO2: 98% 95%   Physical Exam: Cardiac:  RRR, Normal S1, S2. No murmurs Lungs:  Non labored breathing this morning. Diminished breath sounds. Slight wheezing.   Incisions:  Right Groin Puncture site. Clean dry and intact.  Extremities:  Palpable pulses throughout. Warm to touch  Abdomen:  Positive bowel sounds throughout. Soft non tender and non distended.  Neurologic: AAOX3, answers questions and follows commands.   CBC    Component Value Date/Time   WBC 10.3 06/12/2024 0730   RBC 3.14 (L) 06/12/2024 0730   HGB 9.4 (L) 06/12/2024 0730   HGB 10.9 (L) 09/13/2013 0633   HCT 29.0 (L) 06/12/2024 0730   HCT 32.5 (L) 09/13/2013 0633   PLT 144 (L) 06/12/2024 0730   PLT 158 09/13/2013 0633   MCV 92.4 06/12/2024 0730   MCV 90 09/13/2013 0633   MCH 29.9 06/12/2024 0730   MCHC 32.4 06/12/2024 0730   RDW 14.2 06/12/2024 0730   RDW 14.1 09/13/2013 0633   LYMPHSABS 1.6 05/10/2024 1057   LYMPHSABS  2.6 09/13/2013 0633   MONOABS 0.8 05/10/2024 1057   MONOABS 1.1 (H) 09/13/2013 0633   EOSABS 0.1 05/10/2024 1057   EOSABS 0.0 09/13/2013 0633   BASOSABS 0.0 05/10/2024 1057   BASOSABS 0.0 09/13/2013 0633    BMET    Component Value Date/Time   NA 136 06/11/2024 0424   NA 140 09/11/2013 0517   K 4.3 06/11/2024 0424   K 3.7 09/11/2013 0517   CL 101 06/11/2024 0424   CL 108 (H) 09/11/2013 0517   CO2 26 06/11/2024 0424   CO2 26 09/11/2013 0517   GLUCOSE 146 (H) 06/11/2024 0424   GLUCOSE 138 (H) 09/11/2013 0517   BUN 31 (H) 06/11/2024 0424   BUN 26 (H) 09/11/2013 0517   CREATININE 1.27 (H) 06/11/2024 0424   CREATININE 1.14 09/11/2013 0517   CALCIUM  9.1 06/11/2024 0424   CALCIUM  8.9 09/11/2013 0517   GFRNONAA 41 (L) 06/11/2024 0424   GFRNONAA 47 (L) 09/11/2013 0517   GFRAA 60 (L) 04/20/2020 0951   GFRAA 55 (L) 09/11/2013 0517    INR    Component Value Date/Time   INR 1.0 06/10/2024 2252   INR 1.0 09/09/2013 1343     Intake/Output Summary (  Last 24 hours) at 06/12/2024 1230 Last data filed at 06/12/2024 1200 Gross per 24 hour  Intake 1415.07 ml  Output 270 ml  Net 1145.07 ml     Assessment/Plan:  88 y.o. female is s/p SEE ABOVE 1 Day Post-Op   PLAN Okay per vascular surgery to stop heparin  infusion today. Convert to oral anticoagulation. Eliquis  5 mg BID and ASA 81 mg Daily.  OOB to chair with assistance. Wean off Oxygen as tolerated.   DVT prophylaxis:  Heparin  infusion to be converted to oral anticoagulation.    Julie Mcbride [facility] 06/12/2024 12:30 PM

## 2024-06-12 NOTE — Plan of Care (Signed)
  Problem: Health Behavior/Discharge Planning: Goal: Ability to manage health-related needs will improve Outcome: Progressing   Problem: Clinical Measurements: Goal: Ability to maintain clinical measurements within normal limits will improve Outcome: Progressing Goal: Diagnostic test results will improve Outcome: Progressing Goal: Respiratory complications will improve Outcome: Progressing Goal: Cardiovascular complication will be avoided Outcome: Progressing   Problem: Coping: Goal: Level of anxiety will decrease Outcome: Progressing   Problem: Elimination: Goal: Will not experience complications related to urinary retention Outcome: Progressing   Problem: Pain Managment: Goal: General experience of comfort will improve and/or be controlled Outcome: Progressing   Problem: Skin Integrity: Goal: Risk for impaired skin integrity will decrease Outcome: Progressing

## 2024-06-13 ENCOUNTER — Ambulatory Visit: Admission: RE | Admit: 2024-06-13 | Source: Ambulatory Visit

## 2024-06-13 ENCOUNTER — Encounter: Payer: Self-pay | Admitting: *Deleted

## 2024-06-13 DIAGNOSIS — I2602 Saddle embolus of pulmonary artery with acute cor pulmonale: Secondary | ICD-10-CM | POA: Diagnosis not present

## 2024-06-13 LAB — CBC
HCT: 27.5 % — ABNORMAL LOW (ref 36.0–46.0)
Hemoglobin: 8.7 g/dL — ABNORMAL LOW (ref 12.0–15.0)
MCH: 29.6 pg (ref 26.0–34.0)
MCHC: 31.6 g/dL (ref 30.0–36.0)
MCV: 93.5 fL (ref 80.0–100.0)
Platelets: 131 K/uL — ABNORMAL LOW (ref 150–400)
RBC: 2.94 MIL/uL — ABNORMAL LOW (ref 3.87–5.11)
RDW: 13.8 % (ref 11.5–15.5)
WBC: 7.3 K/uL (ref 4.0–10.5)
nRBC: 0 % (ref 0.0–0.2)

## 2024-06-13 LAB — BASIC METABOLIC PANEL WITH GFR
Anion gap: 8 (ref 5–15)
BUN: 32 mg/dL — ABNORMAL HIGH (ref 8–23)
CO2: 25 mmol/L (ref 22–32)
Calcium: 8.7 mg/dL — ABNORMAL LOW (ref 8.9–10.3)
Chloride: 106 mmol/L (ref 98–111)
Creatinine, Ser: 1.14 mg/dL — ABNORMAL HIGH (ref 0.44–1.00)
GFR, Estimated: 47 mL/min — ABNORMAL LOW (ref >60)
Glucose, Bld: 123 mg/dL — ABNORMAL HIGH (ref 70–99)
Potassium: 4.1 mmol/L (ref 3.5–5.1)
Sodium: 139 mmol/L (ref 135–145)

## 2024-06-13 MED ORDER — POLYETHYLENE GLYCOL 3350 17 G PO PACK
34.0000 g | PACK | Freq: Every day | ORAL | Status: DC
  Administered 2024-06-13 – 2024-06-15 (×3): 34 g via ORAL
  Filled 2024-06-13 (×7): qty 2

## 2024-06-13 MED ORDER — FUROSEMIDE 10 MG/ML IJ SOLN
20.0000 mg | Freq: Once | INTRAMUSCULAR | Status: AC
  Administered 2024-06-13: 20 mg via INTRAVENOUS
  Filled 2024-06-13: qty 2

## 2024-06-13 MED ORDER — BISACODYL 10 MG RE SUPP: 10.0000 mg | Freq: Every day | RECTAL | Status: DC | PRN

## 2024-06-13 MED ORDER — SENNA 8.6 MG PO TABS
1.0000 | ORAL_TABLET | Freq: Every day | ORAL | Status: DC
  Administered 2024-06-13 – 2024-06-19 (×7): 8.6 mg via ORAL
  Filled 2024-06-13 (×7): qty 1

## 2024-06-13 MED ORDER — IPRATROPIUM-ALBUTEROL 0.5-2.5 (3) MG/3ML IN SOLN
3.0000 mL | Freq: Four times a day (QID) | RESPIRATORY_TRACT | Status: DC | PRN
  Administered 2024-06-13 – 2024-06-15 (×4): 3 mL via RESPIRATORY_TRACT
  Filled 2024-06-13 (×5): qty 3

## 2024-06-13 NOTE — NC FL2 (Signed)
 [place]  MEDICAID FL2 LEVEL OF CARE FORM     IDENTIFICATION  Patient Name: Julie Mcbride Birthdate: 03-13-1936 Sex: female Admission Date (Current Location): 06/10/2024  [facility] and [place] Number:  Chiropodist and Address:  [facility], [address], [place], [place] [address]      Provider Number: [id number]  Attending Physician Name and Address:  Kandis Devaughn Sayres, MD  Relative Name and Phone Number:  [name] Portugal [phone]    Current Level of Care: Hospital Recommended Level of Care: Skilled Nursing Facility Prior Approval Number:    Date Approved/Denied:   PASRR Number: [id number]  Discharge Plan: SNF    Current Diagnoses: Patient Active Problem List   Diagnosis Date Noted   CAD (coronary artery disease) 06/12/2024   Dementia (HCC) 06/12/2024   Palliative care encounter 06/12/2024   Pulmonary embolus (HCC) 06/12/2024   Acute saddle pulmonary embolism (HCC) 06/11/2024   Dyslipidemia 06/11/2024   Restless leg syndrome 06/11/2024   Class 2 obesity 05/13/2024   Thrombocytopenia (HCC) 05/11/2024   Inadequate pain control 05/10/2024   Adenocarcinoma, lung, unspecified laterality (HCC) 05/10/2024   Epistaxis due to trauma 03/09/2023   Laceration of right hand 03/09/2023   Accidental fall 03/09/2023   Facial contusion, initial encounter 03/09/2023   Closed fracture of nasal bones 03/09/2023   Acute pain of right knee, secondary to fall 03/09/2023   Periorbital contusion of right eye 03/09/2023   History of recurrent UTIs 02/16/2021   History of non-ST elevation myocardial infarction (NSTEMI) 01/01/2021   History of renal artery stenosis 01/01/2021   Ischemic cardiomyopathy 01/01/2021   Primary osteoarthritis of both hands 01/01/2021   Acute on chronic systolic CHF (congestive heart failure) (HCC) 12/28/2020   COVID-19 12/25/2020   Sepsis (HCC) 12/25/2020   Community acquired pneumonia 12/25/2020   Vitamin D   deficiency 04/03/2020   Renovascular hypertension 04/03/2020   Renal artery stenosis (HCC) 04/03/2020   CKD stage 3a, GFR 45-59 ml/min (HCC) 03/18/2020   SHORTNESS OF BREATH 06/17/2009   Essential hypertension 05/27/2009   PALPITATIONS 05/27/2009   ABNORMAL EKG 05/27/2009    Orientation RESPIRATION BLADDER Height & Weight     Self, Time, Situation  Other (Comment) (Scattered wheeze and rales) External catheter Weight: 213 lb 10 oz (96.9 kg) Height:  5' 6 (167.6 cm)  BEHAVIORAL SYMPTOMS/MOOD NEUROLOGICAL BOWEL NUTRITION STATUS      Continent Diet  AMBULATORY STATUS COMMUNICATION OF NEEDS Skin   Limited Assist Verbally Other (Comment) (Bandage over right groin)                       Personal Care Assistance Level of Assistance  Bathing, Feeding, Dressing Bathing Assistance: Maximum assistance Feeding assistance: Maximum assistance Dressing Assistance: Limited assistance     Functional Limitations Info  Sight, Hearing, Speech Sight Info: Adequate Hearing Info: Adequate Speech Info: Adequate    SPECIAL CARE FACTORS FREQUENCY  PT (By licensed PT), OT (By licensed OT)     PT Frequency: 5X OT Frequency: 5X            Contractures Contractures Info: Not present    Additional Factors Info  Code Status, Allergies Code Status Info: DNR-Limited Allergies Info: Ibuprofen; tape; Other unspecified allergies           Current Medications (06/13/2024):  This is the current hospital active medication list Current Facility-Administered Medications  Medication Dose Route Frequency Provider Last Rate Last Admin   acetaminophen  (TYLENOL ) tablet 650 mg  650 mg Oral Q6H PRN Schnier, Cordella MATSU, MD       Or   acetaminophen  (TYLENOL ) suppository 650 mg  650 mg Rectal Q6H PRN Schnier, Gregory G, MD       apixaban  (ELIQUIS ) tablet 5 mg  5 mg Oral BID Pace, Brien R, NP   5 mg at 06/13/24 0908   aspirin  EC tablet 81 mg  81 mg Oral Daily Pace, Brien R, NP   81 mg at 06/13/24 9092    atorvastatin  (LIPITOR) tablet 10 mg  10 mg Oral Daily Schnier, Gregory G, MD   10 mg at 06/13/24 9092   cholecalciferol  (VITAMIN D3) 25 MCG (1000 UNIT) tablet 4,000 Units  4,000 Units Oral Daily Schnier, Gregory G, MD   4,000 Units at 06/13/24 9092   fluticasone  (FLONASE ) 50 MCG/ACT nasal spray 2 spray  2 spray Each Nare BID PRN Schnier, Cordella MATSU, MD       furosemide  (LASIX ) injection 20 mg  20 mg Intravenous Once Wouk, Devaughn Sayres, MD       ipratropium-albuterol  (DUONEB) 0.5-2.5 (3) MG/3ML nebulizer solution 3 mL  3 mL Nebulization Q6H PRN Kandis Devaughn Sayres, MD   3 mL at 06/13/24 9193   lidocaine  (LIDODERM ) 5 % 1 patch  1 patch Transdermal Q24H Schnier, Gregory G, MD   1 patch at 06/11/24 0831   loratadine  (CLARITIN ) tablet 10 mg  10 mg Oral Daily Schnier, Gregory G, MD   10 mg at 06/13/24 9091   magnesium  hydroxide (MILK OF MAGNESIA) suspension 30 mL  30 mL Oral Daily PRN Schnier, Cordella MATSU, MD       methocarbamol  (ROBAXIN ) tablet 500 mg  500 mg Oral Q6H PRN Schnier, Gregory G, MD   500 mg at 06/12/24 1007   multivitamin with minerals tablet 1 tablet  1 tablet Oral Daily Schnier, Gregory G, MD   1 tablet at 06/13/24 9091   ondansetron  (ZOFRAN ) tablet 4 mg  4 mg Oral Q6H PRN Schnier, Cordella MATSU, MD       Or   ondansetron  (ZOFRAN ) injection 4 mg  4 mg Intravenous Q6H PRN Schnier, Gregory G, MD       oxyCODONE -acetaminophen  (PERCOCET/ROXICET) 5-325 MG per tablet 1 tablet  1 tablet Oral Q4H PRN Schnier, Gregory G, MD   1 tablet at 06/13/24 1126   polyethylene glycol (MIRALAX  / GLYCOLAX ) packet 34 g  34 g Oral Daily Wouk, Devaughn Sayres, MD       rOPINIRole  (REQUIP ) tablet 0.5 mg  0.5 mg Oral TID Schnier, Gregory G, MD   0.5 mg at 06/13/24 0908   senna (SENOKOT) tablet 8.6 mg  1 tablet Oral Daily Wouk, Devaughn Sayres, MD       sodium chloride  flush (NS) 0.9 % injection 3 mL  3 mL Intravenous Once Schnier, Gregory G, MD       traZODone  (DESYREL ) tablet 25 mg  25 mg Oral QHS PRN Schnier, Gregory G, MD    25 mg at 06/12/24 2100     Discharge Medications: Please see discharge summary for a list of discharge medications.  Relevant Imaging Results:  Relevant Lab Results:   Additional Information [id number]  [place] L Raniya Golembeski, LCSW

## 2024-06-13 NOTE — Evaluation (Signed)
 Occupational Therapy Evaluation Patient Details Name: Julie Mcbride MRN: [id number] DOB: [birth year] Today's Date: 06/13/2024   History of Present Illness   Pt is an 88 year old female admitted on 06/11/2024 with DVT/PE and underwent mechanical thrombectomy.    PMH significant for recently diagnosed stage IV lung cancer, followed by Dr. Jacobo, osteoarthritis, diverticulitis, GERD, essential hypertension, Alzheimer's dementia and MI     Clinical Impressions Chart reviewed to date, pt greeted in bed, agreeable to OT evaluation however appears anxious throughout. PTA pt was performing ADL grossly with MOD I but PRN assist due to recent medical diagnoses, assist for IADL. Prior to that she was generally MOD I with ADL. Pt presents with deficits in activity tolerance, balance, endurance, strength, pulmonary status affecting safe and optimal ADL completion. Pt is anxious throughout mobility, however generally performs step pivot transfer with MIN A +2 for lines/leads. MAX A for LB dressing/bathing, MIN A for UB dressing. PT will benefit from acute OT to address functional deficits and to facilitate optimal ADL status. OT will follow.      If plan is discharge home, recommend the following:   A lot of help with walking and/or transfers;A lot of help with bathing/dressing/bathroom;Help with stairs or ramp for entrance;Assistance with cooking/housework;Direct supervision/assist for medications management;Direct supervision/assist for financial management;Assist for transportation     Functional Status Assessment   Patient has had a recent decline in their functional status and demonstrates the ability to make significant improvements in function in a reasonable and predictable amount of time.     Equipment Recommendations   Wheelchair (measurements OT) (ramp)     Recommendations for Other Services         Precautions/Restrictions   Precautions Precautions: Fall Recall of  Precautions/Restrictions: Impaired Restrictions Weight Bearing Restrictions Per Provider Order: No     Mobility Bed Mobility Overal bed mobility: Needs Assistance Bed Mobility: Supine to Sit     Supine to sit: HOB elevated, Used rails, Min assist          Transfers Overall transfer level: Needs assistance   Transfers: Sit to/from Stand, Bed to chair/wheelchair/BSC Sit to Stand: Min assist, +2 safety/equipment     Step pivot transfers: Min assist            Balance Overall balance assessment: Needs assistance Sitting-balance support: Feet supported Sitting balance-Leahy Scale: Fair     Standing balance support: During functional activity, Bilateral upper extremity supported, Reliant on assistive device for balance Standing balance-Leahy Scale: Fair                             ADL either performed or assessed with clinical judgement   ADL Overall ADL's : Needs assistance/impaired         Upper Body Bathing: Minimal assistance   Lower Body Bathing: Maximal assistance   Upper Body Dressing : Minimal assistance;Sitting   Lower Body Dressing: Maximal assistance;Bed level Lower Body Dressing Details (indicate cue type and reason): donn shoes Toilet Transfer: Minimal assistance;Rolling walker (2 wheels) Toilet Transfer Details (indicate cue type and reason): step pivot to bedside chair simulated Toileting- Clothing Manipulation and Hygiene: Maximal assistance;Sit to/from stand Toileting - Clothing Manipulation Details (indicate cue type and reason): incontinent urine             Vision Patient Visual Report: No change from baseline       Perception         Praxis  Pertinent Vitals/Pain Pain Assessment Pain Assessment: Faces Faces Pain Scale: Hurts worst Pain Location: L hip 2/2 arthritic pain Pain Descriptors / Indicators: Discomfort, Crying Pain Intervention(s): Premedicated before session, Monitored during session, Limited  activity within patient's tolerance, Repositioned, Utilized relaxation techniques     Extremity/Trunk Assessment Upper Extremity Assessment Upper Extremity Assessment: Generalized weakness   Lower Extremity Assessment Lower Extremity Assessment: Generalized weakness       Communication Communication Communication: No apparent difficulties   Cognition Arousal: Alert Behavior During Therapy: Anxious Cognition: History of cognitive impairments, Cognition impaired           Executive functioning impairment (select all impairments): Reasoning, Problem solving, Sequencing                   Following commands: Impaired Following commands impaired: Only follows one step commands consistently     Cueing  General Comments   Cueing Techniques: Verbal cues;Tactile cues;Visual cues  pt on 4L via [place], spo2 >90% throughout, c/o SOB throughout   Exercises Other Exercises Other Exercises: edu pt/family re: role of OT, role of rehab, discharge recommendations   Shoulder Instructions      Home Living Family/patient expects to be discharged to:: Private residence Living Arrangements: Spouse/significant other Available Help at Discharge: Family;Available 24 hours/day Type of Home: House Home Access: Stairs to enter [employer] of Steps: 3 in the back; front has 4 STE and a door threshold, but uses garage to walk in. Entrance Stairs-Rails:  (L rail for back entrance, right for front) Home Layout: One level     Bathroom Shower/Tub: Producer, television/film/video: Handicapped height Bathroom Accessibility: Yes   Home Equipment: Shower seat - built in;Cane - single point;Hand held Armed forces logistics/support/administrative officer (2 wheels);Grab bars - tub/shower;Rollator (4 wheels)   Additional Comments: SPC for household ambulation, stated increased fear with recent falls (feels much safer with RW)      Prior Functioning/Environment Prior Level of Function :  Independent/Modified Independent;History of Falls (last six months)             Mobility Comments: SPC home/limited community distances ADLs Comments: MOD I for ADL (PRN assist recently), assist for IADL    OT Problem List: Decreased strength;Decreased activity tolerance;Decreased knowledge of use of DME or AE;Decreased safety awareness;Impaired balance (sitting and/or standing)   OT Treatment/Interventions: Self-care/ADL training;Therapeutic exercise;Patient/family education;DME and/or AE instruction;Cognitive remediation/compensation;Therapeutic activities;Energy conservation;Balance training      OT Goals(Current goals can be found in the care plan section)   Acute Rehab OT Goals Patient Stated Goal: decrease pain OT Goal Formulation: With patient Time For Goal Achievement: 06/27/24 Potential to Achieve Goals: Good ADL Goals Pt Will Perform Grooming: with modified independence;sitting;standing Pt Will Perform Lower Body Dressing: with modified independence;sit to/from stand;sitting/lateral leans Pt Will Transfer to Toilet: with modified independence;ambulating Pt Will Perform Toileting - Clothing Manipulation and hygiene: with modified independence;sitting/lateral leans;sit to/from stand   OT Frequency:  Min 2X/week    Co-evaluation PT/OT/SLP Co-Evaluation/Treatment: Yes Reason for Co-Treatment: For patient/therapist safety;To address functional/ADL transfers PT goals addressed during session: Mobility/safety with mobility;Balance;Proper use of DME OT goals addressed during session: ADL's and self-care      AM-PAC OT 6 Clicks Daily Activity     Outcome Measure Help from another person eating meals?: None Help from another person taking care of personal grooming?: None Help from another person toileting, which includes using toliet, bedpan, or urinal?: A Lot Help from another person bathing (including washing, rinsing, drying)?: A  Lot Help from another person to put  on and taking off regular upper body clothing?: A Little Help from another person to put on and taking off regular lower body clothing?: A Lot 6 Click Score: 17   End of Session Equipment Utilized During Treatment: Rolling walker (2 wheels);Oxygen Nurse Communication: Mobility status  Activity Tolerance: Patient limited by pain;Patient limited by fatigue Patient left: in chair;with call bell/phone within reach;with family/visitor present  OT Visit Diagnosis: Other abnormalities of gait and mobility (R26.89);Muscle weakness (generalized) (M62.81)                Time: 8941-8877 OT Time Calculation (min): 24 min Charges:  OT General Charges $OT Visit: 1 Visit OT Evaluation $OT Eval High Complexity: 1 High  Therisa Sheffield, OTD OTR/L  06/13/24, 1:31 PM

## 2024-06-13 NOTE — Progress Notes (Signed)
 PROGRESS NOTE    Julie Mcbride  FMW:[id number] DOB: [birth year] DOA: 06/10/2024 PCP: Alla Amis, MD  Outpatient Specialists: oncology, cardiology, pulmonology    Brief Narrative:   From admission h and p  Julie Mcbride is a 88 y.o. Caucasian female with medical history significant for recently diagnosed stage IV lung cancer, followed by Dr. Jacobo, osteoarthritis, diverticulitis, GERD, essential hypertension, Alzheimer's dementia and MI who presented to the emergency room with acute onset of shortness of breath after going to the bathroom with associated left sided chest pain worsening with deep breathing as well as occasional wheezing.  She denied any nausea or vomiting or abdominal pain.  No fever or chills.  No leg pain or edema or recent travels or surgeries.    Assessment & Plan:   Principal Problem:   Acute saddle pulmonary embolism (HCC) Active Problems:   Dyslipidemia   Essential hypertension   Ischemic cardiomyopathy   CKD stage 3a, GFR 45-59 ml/min (HCC)   Renal artery stenosis (HCC)   Adenocarcinoma, lung, unspecified laterality (HCC)   Class 2 obesity   Restless leg syndrome   CAD (coronary artery disease)   Dementia (HCC)   Palliative care encounter   Pulmonary embolus (HCC)  # Bilateral PE # Right femoral DVT Provoked by malignancy. With RV dysfunction, requiring O2. POD2 from mechanical thrombectomy and catheter placement. Remains symptomatic but improving - heparin  has been transitioned to apixaban   # Acute hypoxic respiratory failure 2/2 PE. On 4 liters and remains dyspneic but improving - [facility] O2, wean as able  # Ischemic cardiomyopathy # Right-sided heart failure Has history ischemic cardiomyopathy. EF normal on TTE here. TTE does show mod pHTN and mod RV systolic dysfunction, 2/2 PE. Discussed with cardiology, lasix  ok and can be helpful, just go slow as patient is pre-load dependent.  - hold home bisoprolol  - lasix  20 iv x1  # Stabe  4 lung cancer Recently established with dr. Jacobo.  - would plan on obtaining that MRI while inpatient once stable. For now she doesn't think she can lie flat for an extended period of time, due to her breathing - outpt oncology f/u  # CKD 3a Kidney function is stable  # Renal artery stenosis S/p stent, recent dopplers show patency - outpt vascular f/u - home plavix  has been transitioned to aspirin   # HTN Here bp appropriate in setting of above - hold home meds  # Restless leg - home requip   # Obesity Noted  # Swallow dysfunction Relayed by patient to nurse. SLP has cleared for regular diet  # Debility - PT advising SNF and patient is agreeable, TOC consulted today for SNF    DVT prophylaxis: apixaban  Code Status: dnr/dni Family Communication: sons updated @ bedside 7/17  Level of care: Med-Surg Status is: Inpatient Remains inpatient appropriate because: severity of illness    Consultants:  vascular  Procedures: See above  Antimicrobials:  Peri-operative    Subjective: Reports dyspnea mildly improved  Objective: Vitals:   06/13/24 0700 06/13/24 0800 06/13/24 0900 06/13/24 1000  BP: 137/65 (!) 170/91 116/61 (!) 116/57  Pulse: 92 (!) 113 (!) 105 93  Resp: 19 (!) 22 19 19   Temp:  98.4 F (36.9 C)    TempSrc:  Axillary    SpO2: 100% 91% 98% 99%  Weight:      Height:        Intake/Output Summary (Last 24 hours) at 06/13/2024 1437 Last data filed at 06/13/2024 0910 Gross per 24 hour  Intake 240 ml  Output 550 ml  Net -310 ml   Filed Weights   06/11/24 1044 06/11/24 1425  Weight: 95.7 kg 96.9 kg    Examination:  General exam: Appears calm and comfortable  Respiratory system: few scattered wheeze and rales, improved from yesterday Cardiovascular system: S1 & S2 heard, RRR. Soft systolic murmur Gastrointestinal system: Abdomen is obese, soft and nontender. No organomegaly or masses felt.   Central nervous system: Alert and oriented. No  focal neurological deficits. Extremities: Symmetric 5 x 5 power. Right leg edema greater than left Skin: bandage over right groin Psychiatry: Judgement and insight appear normal. Mood & affect appropriate.     Data Reviewed: I have personally reviewed following labs and imaging studies  CBC: Recent Labs  Lab 06/10/24 2252 06/11/24 0424 06/12/24 0730 06/13/24 0324  WBC 11.1* 10.7* 10.3 7.3  HGB 10.9* 11.1* 9.4* 8.7*  HCT 34.3* 33.9* 29.0* 27.5*  MCV 93.2 92.1 92.4 93.5  PLT 171 153 144* 131*   Basic Metabolic Panel: Recent Labs  Lab 06/10/24 2252 06/11/24 0424 06/13/24 0324  NA 139 136 139  K 4.1 4.3 4.1  CL 101 101 106  CO2 25 26 25   GLUCOSE 160* 146* 123*  BUN 34* 31* 32*  CREATININE 1.48* 1.27* 1.14*  CALCIUM  9.2 9.1 8.7*   GFR: Estimated Creatinine Clearance: 42.3 mL/min (A) (by C-G formula based on SCr of 1.14 mg/dL (H)). Liver Function Tests: No results for input(s): AST, ALT, ALKPHOS, BILITOT, PROT, ALBUMIN in the last 168 hours. No results for input(s): LIPASE, AMYLASE in the last 168 hours. No results for input(s): AMMONIA in the last 168 hours. Coagulation Profile: Recent Labs  Lab 06/10/24 2252  INR 1.0   Cardiac Enzymes: No results for input(s): CKTOTAL, CKMB, CKMBINDEX, TROPONINI in the last 168 hours. BNP (last 3 results) No results for input(s): PROBNP in the last 8760 hours. HbA1C: No results for input(s): HGBA1C in the last 72 hours. CBG: Recent Labs  Lab 06/11/24 1423  GLUCAP 108*   Lipid Profile: No results for input(s): CHOL, HDL, LDLCALC, TRIG, CHOLHDL, LDLDIRECT in the last 72 hours. Thyroid Function Tests: No results for input(s): TSH, T4TOTAL, FREET4, T3FREE, THYROIDAB in the last 72 hours. Anemia Panel: No results for input(s): VITAMINB12, FOLATE, FERRITIN, TIBC, IRON, RETICCTPCT in the last 72 hours. Urine analysis:    Component Value Date/Time   COLORURINE  YELLOW (A) 05/12/2024 1306   APPEARANCEUR CLOUDY (A) 05/12/2024 1306   APPEARANCEUR Hazy (A) 08/18/2021 1452   LABSPEC 1.015 05/12/2024 1306   LABSPEC 1.021 09/08/2013 0127   PHURINE 5.0 05/12/2024 1306   GLUCOSEU NEGATIVE 05/12/2024 1306   GLUCOSEU Negative 09/08/2013 0127   HGBUR NEGATIVE 05/12/2024 1306   BILIRUBINUR NEGATIVE 05/12/2024 1306   BILIRUBINUR Negative 08/18/2021 1452   BILIRUBINUR Negative 09/08/2013 0127   KETONESUR NEGATIVE 05/12/2024 1306   PROTEINUR NEGATIVE 05/12/2024 1306   NITRITE POSITIVE (A) 05/12/2024 1306   LEUKOCYTESUR LARGE (A) 05/12/2024 1306   LEUKOCYTESUR Negative 09/08/2013 0127   Sepsis Labs: @LABRCNTIP (procalcitonin:4,lacticidven:4)  ) Recent Results (from the past 240 hours)  MRSA Next Gen by PCR, Nasal     Status: None   Collection Time: 06/11/24  2:49 PM   Specimen: Nasal Mucosa; Nasal Swab  Result Value Ref Range Status   MRSA by PCR Next Gen NOT DETECTED NOT DETECTED Final    Comment: (NOTE) The GeneXpert MRSA Assay (FDA approved for NASAL specimens only), is one component of a comprehensive MRSA colonization surveillance  program. It is not intended to diagnose MRSA infection nor to guide or monitor treatment for MRSA infections. Test performance is not FDA approved in patients less than 72 years old. Performed at [facility], [address] [place], [place] [address]          Radiology Studies: No results found.       Scheduled Meds:  apixaban   5 mg Oral BID   aspirin  EC  81 mg Oral Daily   atorvastatin   10 mg Oral Daily   cholecalciferol   4,000 Units Oral Daily   furosemide   20 mg Intravenous Once   lidocaine   1 patch Transdermal Q24H   loratadine   10 mg Oral Daily   multivitamin with minerals  1 tablet Oral Daily   polyethylene glycol  34 g Oral Daily   rOPINIRole   0.5 mg Oral TID   senna  1 tablet Oral Daily   sodium chloride  flush  3 mL Intravenous Once   Continuous Infusions:     LOS: 2 days    Devaughn KATHEE Ban, MD [facility]   If 7PM-7AM, please contact night-coverage [email or web address] Password [facility] 06/13/2024, 2:37 PM

## 2024-06-13 NOTE — Progress Notes (Signed)
 Progress Note    06/13/2024 1:35 PM 2 Days Post-Op  Subjective:  Julie Mcbride is an 88 yo female who is nor POD #2 from:   Procedure(s) Performed:             1.  Contrast injection right heart and bilateral pulmonary arteries             2.  Mechanical thrombectomy bilateral lobar pulmonary arteries for removal of pulmonary emboli using the Penumbra CAT 8 thrombectomy catheter.             3.  Selective catheter placement right upper lobe pulmonary artery, middle lobe pulmonary artery and lower lobe pulmonary artery             4.  Selective catheter placement left upper lobe pulmonary artery and lower lobe pulmonary artery   Patient is resting comfortably in bed in ICU this morning. She endorses pain generally but admits she is breathing better this morning. Patient's heparin  infusion has been discontinued this morning. Patient started on oral anticoagulation Eliquis  5 mg BID. No complications overnight and vitals all remain stable.  Vitals:   06/13/24 0900 06/13/24 1000  BP: 116/61 (!) 116/57  Pulse: (!) 105 93  Resp: 19 19  Temp:    SpO2: 98% 99%   Physical Exam: Cardiac:  RRR with tachycardia, Normal S1, S2. No murmurs Lungs:  Non labored breathing this morning. Diminished breath sounds. Slight wheezing.  Wearing 3 Liters Nasal Cannula Oxygen.  Incisions:  Right Groin Puncture site. Clean dry and intact.  Extremities:  Palpable pulses throughout. Warm to touch  Abdomen:  Positive bowel sounds throughout. Soft non tender and non distended.  Neurologic: AAOX3, answers questions and follows commands.   CBC    Component Value Date/Time   WBC 7.3 06/13/2024 0324   RBC 2.94 (L) 06/13/2024 0324   HGB 8.7 (L) 06/13/2024 0324   HGB 10.9 (L) 09/13/2013 0633   HCT 27.5 (L) 06/13/2024 0324   HCT 32.5 (L) 09/13/2013 0633   PLT 131 (L) 06/13/2024 0324   PLT 158 09/13/2013 0633   MCV 93.5 06/13/2024 0324   MCV 90 09/13/2013 0633   MCH 29.6 06/13/2024 0324   MCHC 31.6  06/13/2024 0324   RDW 13.8 06/13/2024 0324   RDW 14.1 09/13/2013 0633   LYMPHSABS 1.6 05/10/2024 1057   LYMPHSABS 2.6 09/13/2013 0633   MONOABS 0.8 05/10/2024 1057   MONOABS 1.1 (H) 09/13/2013 0633   EOSABS 0.1 05/10/2024 1057   EOSABS 0.0 09/13/2013 0633   BASOSABS 0.0 05/10/2024 1057   BASOSABS 0.0 09/13/2013 0633    BMET    Component Value Date/Time   NA 139 06/13/2024 0324   NA 140 09/11/2013 0517   K 4.1 06/13/2024 0324   K 3.7 09/11/2013 0517   CL 106 06/13/2024 0324   CL 108 (H) 09/11/2013 0517   CO2 25 06/13/2024 0324   CO2 26 09/11/2013 0517   GLUCOSE 123 (H) 06/13/2024 0324   GLUCOSE 138 (H) 09/11/2013 0517   BUN 32 (H) 06/13/2024 0324   BUN 26 (H) 09/11/2013 0517   CREATININE 1.14 (H) 06/13/2024 0324   CREATININE 1.14 09/11/2013 0517   CALCIUM  8.7 (L) 06/13/2024 0324   CALCIUM  8.9 09/11/2013 0517   GFRNONAA 47 (L) 06/13/2024 0324   GFRNONAA 47 (L) 09/11/2013 0517   GFRAA 60 (L) 04/20/2020 0951   GFRAA 55 (L) 09/11/2013 0517    INR    Component Value Date/Time   INR 1.0  06/10/2024 2252   INR 1.0 09/09/2013 1343     Intake/Output Summary (Last 24 hours) at 06/13/2024 1335 Last data filed at 06/13/2024 0910 Gross per 24 hour  Intake 250.74 ml  Output 550 ml  Net -299.26 ml     Assessment/Plan:  88 y.o. female is s/p SEE ABOVE 2 Days Post-Op   PLAN Continue Oral anticoagulation as started.  OOB to chair with assistance Wean Oxygen as tolerated.   DVT prophylaxis:  Eliquis  5 mg BID and ASA 81 mg daily.    [name] 06/13/2024 1:35 PM

## 2024-06-13 NOTE — TOC Initial Note (Signed)
 Transition of Care [facility]) - Initial/Assessment Note    Patient Details  Name: Julie Mcbride MRN: [id number] Date of Birth: Aug 16, 1936  [facility]) CM/SW Contact:    [place] L Emeline Simpson, LCSW Phone Number: 06/13/2024, 4:12 PM  Clinical Narrative:                    CSW spoke with patient daughter, [name] Portugal about SNF placement. Ms. Portugal included her siblings in on the call. CSW advised of the recommendation. The family advised that they felt that patient could benefit from SNF placement and they were offered choice.   FL2 was completed and signed. FL2 was sent to several facilities.      Patient Goals and CMS Choice            Expected Discharge Plan and Services                                              Prior Living Arrangements/Services                       Activities of Daily Living      Permission Sought/Granted                  Emotional Assessment              Admission diagnosis:  Acute saddle pulmonary embolism (HCC) [I26.92] Other acute pulmonary embolism with acute cor pulmonale (HCC) [I26.09] Patient Active Problem List   Diagnosis Date Noted   CAD (coronary artery disease) 06/12/2024   Dementia (HCC) 06/12/2024   Palliative care encounter 06/12/2024   Pulmonary embolus (HCC) 06/12/2024   Acute saddle pulmonary embolism (HCC) 06/11/2024   Dyslipidemia 06/11/2024   Restless leg syndrome 06/11/2024   Class 2 obesity 05/13/2024   Thrombocytopenia (HCC) 05/11/2024   Inadequate pain control 05/10/2024   Adenocarcinoma, lung, unspecified laterality (HCC) 05/10/2024   Epistaxis due to trauma 03/09/2023   Laceration of right hand 03/09/2023   Accidental fall 03/09/2023   Facial contusion, initial encounter 03/09/2023   Closed fracture of nasal bones 03/09/2023   Acute pain of right knee, secondary to fall 03/09/2023   Periorbital contusion of right eye 03/09/2023   History of recurrent UTIs  02/16/2021   History of non-ST elevation myocardial infarction (NSTEMI) 01/01/2021   History of renal artery stenosis 01/01/2021   Ischemic cardiomyopathy 01/01/2021   Primary osteoarthritis of both hands 01/01/2021   Acute on chronic systolic CHF (congestive heart failure) (HCC) 12/28/2020   COVID-19 12/25/2020   Sepsis (HCC) 12/25/2020   Community acquired pneumonia 12/25/2020   Vitamin D  deficiency 04/03/2020   Renovascular hypertension 04/03/2020   Renal artery stenosis (HCC) 04/03/2020   CKD stage 3a, GFR 45-59 ml/min (HCC) 03/18/2020   SHORTNESS OF BREATH 06/17/2009   Essential hypertension 05/27/2009   PALPITATIONS 05/27/2009   ABNORMAL EKG 05/27/2009   PCP:  Alla Amis, MD Pharmacy:   [facility] - [place], [place] - [address] [place] [address] Phone: [phone] Fax: [phone]     Social Drivers of Health (SDOH) Social History: SDOH Screenings   Food Insecurity: No Food Insecurity (06/13/2024)  Housing: Low Risk  (06/13/2024)  Transportation Needs: No Transportation Needs (06/13/2024)  Utilities: Not At Risk (06/13/2024)  Depression (PHQ2-9): Low Risk  (05/30/2024)  Financial Resource Strain: Low Risk  (05/15/2024)   Received from [facility]  Social Connections: Socially Integrated (06/13/2024)  Tobacco Use: Low Risk  (06/11/2024)   SDOH Interventions:     Readmission Risk Interventions     No data to display

## 2024-06-13 NOTE — Plan of Care (Signed)
  Problem: Clinical Measurements: Goal: Respiratory complications will improve Outcome: Not Progressing Goal: Cardiovascular complication will be avoided Outcome: Not Progressing   Problem: Activity: Goal: Risk for activity intolerance will decrease Outcome: Not Progressing

## 2024-06-13 NOTE — Evaluation (Signed)
 Physical Therapy Evaluation Patient Details Name: Julie Mcbride MRN: [id number] DOB: 1936-01-07 Today's Date: 06/13/2024  History of Present Illness  Pt is an 88 y/o F admitted on 06/10/24 after presenting with c/o acute onset of SOB, L sided chest pain & occasional wheezing. Pt found to have Bilateral PE, R femoral DVT, provoked by malignancy. Pt underwent bilateral pulmonary thrombectomy on 06/11/24. PMH: recently diagnosed stage IV lung CA, OA, diverticulitis, GERD, HTN, Alzheimer's dementia, MI, laminectomy  Clinical Impression  Pt seen for PT evaluation with pt agreeable with encouragement, spouse present & son arriving at end of session. Pt anxious throughout mobility, therapists' attempt to provide relaxation, encouragement throughout session. Pt is able to complete bed mobility & transfers with min assist. Pt with c/o SOB but SpO2 >/= 90% throughout (on 4L/min). Pt c/o significant chronic arthritic pain in L hip with mobility. Will continue to follow pt acutely to progress mobility as able.        If plan is discharge home, recommend the following: A little help with walking and/or transfers;A little help with bathing/dressing/bathroom;Assistance with cooking/housework;Assist for transportation;Help with stairs or ramp for entrance   Can travel by private vehicle   Yes    Equipment Recommendations Wheelchair cushion (measurements PT);Wheelchair (measurements PT)  Recommendations for Other Services  Rehab consult    Functional Status Assessment Patient has had a recent decline in their functional status and/or demonstrates limited ability to make significant improvements in function in a reasonable and predictable amount of time     Precautions / Restrictions Precautions Precautions: Fall Restrictions Weight Bearing Restrictions Per Provider Order: No      Mobility  Bed Mobility Overal bed mobility: Needs Assistance Bed Mobility: Supine to Sit     Supine to sit: HOB  elevated, Used rails, Min assist (extra time, exit L side of bed)          Transfers Overall transfer level: Needs assistance Equipment used: Rolling walker (2 wheels) Transfers: Sit to/from Stand, Bed to chair/wheelchair/BSC Sit to Stand: Min assist, +2 safety/equipment (cuing re: hand placement)   Step pivot transfers: Min assist (bed>recliner on L with RW)            Ambulation/Gait                  Stairs            Wheelchair Mobility     Tilt Bed    Modified Rankin (Stroke Patients Only)       Balance Overall balance assessment: Needs assistance Sitting-balance support: Feet supported Sitting balance-Leahy Scale: Fair     Standing balance support: During functional activity, Bilateral upper extremity supported, Reliant on assistive device for balance Standing balance-Leahy Scale: Fair                               Pertinent Vitals/Pain Pain Assessment Pain Assessment: Faces Faces Pain Scale: Hurts worst Pain Location: L hip 2/2 arthritic pain Pain Descriptors / Indicators: Discomfort, Crying Pain Intervention(s): Utilized relaxation techniques, Limited activity within patient's tolerance, Monitored during session, Repositioned, Relaxation    Home Living Family/patient expects to be discharged to:: Private residence Living Arrangements: Spouse/significant other Available Help at Discharge: Family;Available 24 hours/day Type of Home: House Home Access: Stairs to enter Entrance Stairs-Rails:  (L rail for back entrance, R rail for front) Entrance Stairs-Number of Steps: 3 in the back; front has 4 STE and a door threshold,  but uses garage to walk in.   Home Layout: One level Home Equipment: Shower seat - built in;Cane - single point;Hand held Armed forces logistics/support/administrative officer (2 wheels);Grab bars - tub/shower;Rollator (4 wheels) Additional Comments: SPC for household ambulation, stated increased fear with recent falls (feels  much safer with RW)    Prior Function Prior Level of Function : Independent/Modified Independent;History of Falls (last six months)             Mobility Comments: Walks with SPC in the home and to church; 1 fall at church <2 months ago when legs gave out; reports able to walk through grocery store pushing cart (prior to most recent admission) ADLs Comments: IND with ADLs, team effort with husband for IADLS; typically stands for shower (prior to most recent admission)     Extremity/Trunk Assessment   Upper Extremity Assessment Upper Extremity Assessment: Generalized weakness    Lower Extremity Assessment Lower Extremity Assessment: Generalized weakness       Communication   Communication Communication: No apparent difficulties    Cognition Arousal: Alert Behavior During Therapy: Anxious   PT - Cognitive impairments: Safety/Judgement                         Following commands: Impaired Following commands impaired: Follows one step commands with increased time     Cueing Cueing Techniques: Verbal cues     General Comments General comments (skin integrity, edema, etc.): Pt on 4L/min, SPO2 >/= 90% despite c/o SOB.    Exercises     Assessment/Plan    PT Assessment Patient needs continued PT services  PT Problem List Decreased strength;Cardiopulmonary status limiting activity;Pain;Decreased range of motion;Decreased balance;Decreased activity tolerance;Decreased mobility;Decreased safety awareness;Decreased knowledge of use of DME       PT Treatment Interventions DME instruction;Balance training;Gait training;Neuromuscular re-education;Stair training;Functional mobility training;Patient/family education;Therapeutic activities;Therapeutic exercise;Manual techniques;Wheelchair mobility training;Modalities    PT Goals (Current goals can be found in the Care Plan section)  Acute Rehab PT Goals Patient Stated Goal: decreased pain, get better PT Goal  Formulation: With patient/family Time For Goal Achievement: 06/27/24 Potential to Achieve Goals: Fair    Frequency Min 2X/week     Co-evaluation PT/OT/SLP Co-Evaluation/Treatment: Yes Reason for Co-Treatment: For patient/therapist safety;To address functional/ADL transfers PT goals addressed during session: Mobility/safety with mobility;Balance;Proper use of DME         AM-PAC PT 6 Clicks Mobility  Outcome Measure Help needed turning from your back to your side while in a flat bed without using bedrails?: A Little Help needed moving from lying on your back to sitting on the side of a flat bed without using bedrails?: A Little Help needed moving to and from a bed to a chair (including a wheelchair)?: A Little Help needed standing up from a chair using your arms (e.g., wheelchair or bedside chair)?: A Little Help needed to walk in hospital room?: A Lot Help needed climbing 3-5 steps with a railing? : Total 6 Click Score: 15    End of Session Equipment Utilized During Treatment: Oxygen Activity Tolerance: Patient limited by pain Patient left: in chair;with call bell/phone within reach;with family/visitor present Nurse Communication: Mobility status (c/o pain) PT Visit Diagnosis: Muscle weakness (generalized) (M62.81);Pain;Other abnormalities of gait and mobility (R26.89);Difficulty in walking, not elsewhere classified (R26.2);Unsteadiness on feet (R26.81) Pain - Right/Left: Left Pain - part of body: Hip    Time: 8941-8877 PT Time Calculation (min) (ACUTE ONLY): 24 min   Charges:   PT  Evaluation $PT Eval Moderate Complexity: 1 Mod   PT General Charges $$ ACUTE PT VISIT: 1 Visit         Richerd Pinal, PT, DPT 06/13/24, 12:54 PM   Richerd CHRISTELLA Pinal 06/13/2024, 12:53 PM

## 2024-06-14 ENCOUNTER — Inpatient Hospital Stay

## 2024-06-14 DIAGNOSIS — I2602 Saddle embolus of pulmonary artery with acute cor pulmonale: Secondary | ICD-10-CM | POA: Diagnosis not present

## 2024-06-14 LAB — RESPIRATORY PANEL BY PCR

## 2024-06-14 LAB — CBC
HCT: 27.5 % — ABNORMAL LOW (ref 36.0–46.0)
Hemoglobin: 8.8 g/dL — ABNORMAL LOW (ref 12.0–15.0)
MCH: 29.8 pg (ref 26.0–34.0)
MCHC: 32 g/dL (ref 30.0–36.0)
MCV: 93.2 fL (ref 80.0–100.0)
Platelets: 124 K/uL — ABNORMAL LOW (ref 150–400)
RBC: 2.95 MIL/uL — ABNORMAL LOW (ref 3.87–5.11)
RDW: 13.7 % (ref 11.5–15.5)
WBC: 5.9 K/uL (ref 4.0–10.5)
nRBC: 0 % (ref 0.0–0.2)

## 2024-06-14 LAB — BASIC METABOLIC PANEL WITH GFR
Anion gap: 6 (ref 5–15)
BUN: 32 mg/dL — ABNORMAL HIGH (ref 8–23)
CO2: 27 mmol/L (ref 22–32)
Calcium: 8.8 mg/dL — ABNORMAL LOW (ref 8.9–10.3)
Chloride: 105 mmol/L (ref 98–111)
Creatinine, Ser: 1.13 mg/dL — ABNORMAL HIGH (ref 0.44–1.00)
GFR, Estimated: 48 mL/min — ABNORMAL LOW (ref >60)
Glucose, Bld: 118 mg/dL — ABNORMAL HIGH (ref 70–99)
Potassium: 4.1 mmol/L (ref 3.5–5.1)
Sodium: 138 mmol/L (ref 135–145)

## 2024-06-14 LAB — SARS CORONAVIRUS 2 BY RT PCR: SARS Coronavirus 2 by RT PCR: NEGATIVE

## 2024-06-14 LAB — EXPECTORATED SPUTUM ASSESSMENT W GRAM STAIN, RFLX TO RESP C

## 2024-06-14 MED ORDER — OXYMETAZOLINE HCL 0.05 % NA SOLN
1.0000 | Freq: Two times a day (BID) | NASAL | Status: AC | PRN
  Filled 2024-06-14: qty 15

## 2024-06-14 MED ORDER — ENSURE PLUS HIGH PROTEIN PO LIQD
237.0000 mL | Freq: Two times a day (BID) | ORAL | Status: DC
  Administered 2024-06-15 – 2024-06-19 (×3): 237 mL via ORAL

## 2024-06-14 MED ORDER — FUROSEMIDE 10 MG/ML IJ SOLN
40.0000 mg | Freq: Once | INTRAMUSCULAR | Status: AC
  Administered 2024-06-14: 40 mg via INTRAVENOUS
  Filled 2024-06-14: qty 4

## 2024-06-14 MED ORDER — OXYCODONE HCL 5 MG PO TABS
5.0000 mg | ORAL_TABLET | Freq: Four times a day (QID) | ORAL | Status: DC | PRN
  Administered 2024-06-15: 10 mg via ORAL
  Administered 2024-06-15: 5 mg via ORAL
  Administered 2024-06-16: 10 mg via ORAL
  Administered 2024-06-17: 5 mg via ORAL
  Administered 2024-06-17 (×2): 10 mg via ORAL
  Administered 2024-06-18: 5 mg via ORAL
  Filled 2024-06-14 (×3): qty 2
  Filled 2024-06-14: qty 1
  Filled 2024-06-14: qty 2
  Filled 2024-06-14 (×2): qty 1

## 2024-06-14 NOTE — Progress Notes (Signed)
 Order received from provider to discontinue cardiac monitoring.  Report given to receiving nurse for room 156.

## 2024-06-14 NOTE — Plan of Care (Signed)
  Problem: Education: Goal: Knowledge of General Education information will improve Description: Including pain rating scale, medication(s)/side effects and non-pharmacologic comfort measures Outcome: Progressing   Problem: Elimination: Goal: Will not experience complications related to urinary retention Outcome: Progressing   Problem: Pain Managment: Goal: General experience of comfort will improve and/or be controlled Outcome: Progressing   Problem: Safety: Goal: Ability to remain free from injury will improve Outcome: Progressing

## 2024-06-14 NOTE — TOC Progression Note (Addendum)
 Transition of Care [facility]) - Progression Note    Patient Details  Name: Julie Mcbride MRN: [id number] Date of Birth: Jul 07, 1936  [facility]) CM/SW Contact  [place] L Deven [place], [place] Phone Number: 06/14/2024, 4:17 PM  Clinical Narrative:     CSW spoke with [name], daughter of patient, regarding SNF choice. [name] was provided with bed offers. [name] advised that she needed to speak with her brothers.   4:11pm CSW spoke with [name] to follow-up with her regarding SNF choice. CSW advised [name] that CSW spoke with Alfonso Novak, [place], regarding a bed offer. CSW advised that Ms. Novak did not want to offer a bed to eventually have to take it back. CSW explained that Ms. Novak stated that she has a few independent living patients at [facility] who may need the SNF beds.   [name] advised that CSW can accept the bed at [facility] and start authorization.    4:29: [facility] has not been started. Patient has contact precautions. [name] called CSW back and advised that [facility] is the first choice    Expected Discharge Plan and Services                                               Social Determinants of Health (SDOH) Interventions SDOH Screenings   Food Insecurity: No Food Insecurity (06/13/2024)  Housing: Low Risk  (06/13/2024)  Transportation Needs: No Transportation Needs (06/13/2024)  Utilities: Not At Risk (06/13/2024)  Depression (PHQ2-9): Low Risk  (05/30/2024)  Financial Resource Strain: Low Risk  (05/15/2024)   Received from [facility]  Social Connections: Socially Integrated (06/13/2024)  Tobacco Use: Low Risk  (06/11/2024)    Readmission Risk Interventions     No data to display

## 2024-06-14 NOTE — Progress Notes (Signed)
 Patient transferred to room 156 in hospital bed with belongings.

## 2024-06-14 NOTE — Progress Notes (Signed)
 Physical Therapy Treatment Patient Details Name: Julie Mcbride MRN: [id number] DOB: Nov 17, 1936 Today's Date: 06/14/2024   History of Present Illness Pt is an 88 year old female admitted on 06/11/2024 with DVT/PE and underwent mechanical thrombectomy.    PMH significant for recently diagnosed stage IV lung cancer, followed by Dr. Jacobo, osteoarthritis, diverticulitis, GERD, essential hypertension, Alzheimer's dementia and MI    PT Comments  Pt pleasant but quite limited today.  On arrival she was complaining of R hip pain, had shallow breathing (SpO2 in the mid/high 90s on 4L) and HR was steady at ~115bpm.  She initially didn't want to do any PT today, but did agree that some activity was better than none and with much encouragement did agree to some limited supine LE exercises.  Pt displayed some anxiousness t/o the session and was briefly tearful due to her acute and subacute medical issues.  We were able to do a few sets of LE exercises but with each modest effort pt became more dyspneic (SpO2 dropping to ~90% each time) and HR rising to 120s.  Pt would need a rest break before the next set and had similar vitals reaction each time.  Pt acknowledges the need to do more OOB activity but did not feel (seemingly appropriately) that she would be able to do so today.  Will benefit from ongoing PT, continue with POC.    If plan is discharge home, recommend the following: A little help with walking and/or transfers;A little help with bathing/dressing/bathroom;Assistance with cooking/housework;Assist for transportation;Help with stairs or ramp for entrance   Can travel by private vehicle     Yes  Equipment Recommendations   (TBD)    Recommendations for Other Services       Precautions / Restrictions Precautions Precautions: Fall Recall of Precautions/Restrictions: Impaired Restrictions Weight Bearing Restrictions Per Provider Order: No     Mobility  Bed Mobility                General bed mobility comments: Pt anxious about getting up, states she would rather not.  At rest in bed she had elevated HR 110-120.  on 4L O2 with SpO2 93-97%, SOB with more prolonged talking - asks to defer OOB activity today and this seemed appropriate from a vitals and anxiety stand point    Transfers                        Ambulation/Gait                   Stairs             Wheelchair Mobility     Tilt Bed    Modified Rankin (Stroke Patients Only)       Balance                                            Communication Communication Communication: No apparent difficulties  Cognition Arousal: Alert Behavior During Therapy: Anxious   PT - Cognitive impairments: Safety/Judgement                       PT - Cognition Comments: Pt open about her anxieties, pleasant Following commands: Impaired Following commands impaired: Only follows one step commands consistently    Cueing    Exercises General Exercises - Lower Extremity Ankle Circles/Pumps:  AROM, 10 reps Heel Slides: Strengthening, 10 reps (with resisted leg ext) Hip ABduction/ADduction: Strengthening, 10 reps    General Comments General comments (skin integrity, edema, etc.): Pt pleasant but hesitant to do a lot.  She had signficant DOE with minimal supine bed exercises (on 4L O2) and was generally not feeling well      Pertinent Vitals/Pain Pain Assessment Pain Assessment: 0-10 Pain Score: 3  Pain Location: R>L hip    Home Living                          Prior Function            PT Goals (current goals can now be found in the care plan section) Progress towards PT goals:  (limited session today)    Frequency    Min 2X/week      PT Plan      Co-evaluation              AM-PAC PT 6 Clicks Mobility   Outcome Measure  Help needed turning from your back to your side while in a flat bed without using bedrails?: A  Lot Help needed moving from lying on your back to sitting on the side of a flat bed without using bedrails?: A Lot Help needed moving to and from a bed to a chair (including a wheelchair)?: A Lot Help needed standing up from a chair using your arms (e.g., wheelchair or bedside chair)?: A Lot Help needed to walk in hospital room?: A Lot Help needed climbing 3-5 steps with a railing? : A Lot 6 Click Score: 12    End of Session Equipment Utilized During Treatment: Oxygen Activity Tolerance: Patient limited by pain Patient left: in chair;with call bell/phone within reach;with family/visitor present Nurse Communication: Mobility status PT Visit Diagnosis: Muscle weakness (generalized) (M62.81);Pain;Other abnormalities of gait and mobility (R26.89);Difficulty in walking, not elsewhere classified (R26.2);Unsteadiness on feet (R26.81) Pain - Right/Left: Left (b/l) Pain - part of body: Hip     Time: 8597-8580 PT Time Calculation (min) (ACUTE ONLY): 17 min  Charges:    $Therapeutic Exercise: 8-22 mins PT General Charges $$ ACUTE PT VISIT: 1 Visit                     Carmin JONELLE Deed, DPT 06/14/2024, 4:03 PM

## 2024-06-14 NOTE — Progress Notes (Signed)
 PROGRESS NOTE    [name]  FMW:[id number] DOB: 1936/11/28 DOA: 06/10/2024 PCP: Alla Amis, MD  Outpatient Specialists: oncology, cardiology, pulmonology    Brief Narrative:   From admission h and p  Julie Mcbride is a 88 y.o. Caucasian female with medical history significant for recently diagnosed stage IV lung cancer, followed by Dr. Jacobo, osteoarthritis, diverticulitis, GERD, essential hypertension, Alzheimer's dementia and MI who presented to the emergency room with acute onset of shortness of breath after going to the bathroom with associated left sided chest pain worsening with deep breathing as well as occasional wheezing.  She denied any nausea or vomiting or abdominal pain.  No fever or chills.  No leg pain or edema or recent travels or surgeries.    Assessment & Plan:   Principal Problem:   Acute saddle pulmonary embolism (HCC) Active Problems:   Dyslipidemia   Essential hypertension   Ischemic cardiomyopathy   CKD stage 3a, GFR 45-59 ml/min (HCC)   Renal artery stenosis (HCC)   Adenocarcinoma, lung, unspecified laterality (HCC)   Class 2 obesity   Restless leg syndrome   CAD (coronary artery disease)   Dementia (HCC)   Palliative care encounter   Pulmonary embolus (HCC)  # Bilateral PE # Right femoral DVT Provoked by malignancy. With RV dysfunction, requiring O2. POD2 from mechanical thrombectomy and catheter placement. Remains symptomatic but slowly improving - heparin  has been transitioned to apixaban   # Acute hypoxic respiratory failure 2/2 PE. On 4 liters and remains dyspneic but improving slowly - [place] O2, wean as able  # Cough Likely 2/2 malignancy and PE. No fever or white count. But says cough is worsening - check respiratory panel, sputum culture - CXR  # Ischemic cardiomyopathy # Right-sided heart failure Has history ischemic cardiomyopathy. EF normal on TTE here. TTE does show mod pHTN and mod RV systolic dysfunction, 2/2  PE. Discussed with cardiology, lasix  ok and can be helpful, just go slow as patient is pre-load dependent. Tolerated 20 of IV lasix  - hold home bisoprolol  - lasix  40 IV x1  # Chronic low back pain Told nurse her hip is hurting but on exam it is her right lower back, says this is a chronic problem present for years, some days better than others. CT of lumbar and thoracic spine last month showed mild diffuse degenerative disease throughout the lumbar and thoracic spine. No mets seen. - continue pain control  # Stage 4 lung cancer Recently established with dr. Jacobo. Onc palliative has seen here in the hospital. - would plan on obtaining that MRI while inpatient once stable. For now she doesn't think she can lie flat for an extended period of time, due to her breathing - outpt oncology f/u  # CKD 3a Kidney function is stable  # Renal artery stenosis S/p stent, recent dopplers show patency - outpt vascular f/u - home plavix  has been transitioned to aspirin   # HTN Here bp appropriate in setting of above - hold home meds  # Restless leg - home requip   # Obesity Noted  # Swallow dysfunction Relayed by patient to nurse. SLP has cleared for regular diet  # Debility - PT advising SNF and patient is agreeable, TOC consulted for SNF    DVT prophylaxis: apixaban  Code Status: dnr/dni Family Communication: son updated @ bedside 7/18  Level of care: Med-Surg Status is: Inpatient Remains inpatient appropriate because: severity of illness    Consultants:  vascular  Procedures: See above  Antimicrobials:  Peri-operative    Subjective: Reports ongoing dyspnea, cough. Right low back pain  Objective: Vitals:   06/14/24 0700 06/14/24 0732 06/14/24 0849 06/14/24 1047  BP: 131/63 (!) 146/67 138/81 138/77  Pulse: 95 98 (!) 123 (!) 126  Resp: 18 16  (!) 22  Temp:  98.1 F (36.7 C) 97.8 F (36.6 C) 97.8 F (36.6 C)  TempSrc:  Oral    SpO2: 99% 99% 98% 96%  Weight:       Height:        Intake/Output Summary (Last 24 hours) at 06/14/2024 1237 Last data filed at 06/13/2024 1626 Gross per 24 hour  Intake --  Output 300 ml  Net -300 ml   Filed Weights   06/11/24 1044 06/11/24 1425  Weight: 95.7 kg 96.9 kg    Examination:  General exam: Appears uncomfortable Respiratory system: mild tachypnea. No wheeze Cardiovascular system: S1 & S2 heard, RRR. Soft systolic murmur Gastrointestinal system: Abdomen is obese, soft and nontender. No organomegaly or masses felt.   Central nervous system: Alert and oriented. No focal neurological deficits. Extremities: Symmetric 5 x 5 power. Right leg edema greater than left Skin: bandage over right groin Psychiatry: Judgement and insight appear normal. Appears anxious and distressed    Data Reviewed: I have personally reviewed following labs and imaging studies  CBC: Recent Labs  Lab 06/10/24 2252 06/11/24 0424 06/12/24 0730 06/13/24 0324 06/14/24 0549  WBC 11.1* 10.7* 10.3 7.3 5.9  HGB 10.9* 11.1* 9.4* 8.7* 8.8*  HCT 34.3* 33.9* 29.0* 27.5* 27.5*  MCV 93.2 92.1 92.4 93.5 93.2  PLT 171 153 144* 131* 124*   Basic Metabolic Panel: Recent Labs  Lab 06/10/24 2252 06/11/24 0424 06/13/24 0324 06/14/24 0549  NA 139 136 139 138  K 4.1 4.3 4.1 4.1  CL 101 101 106 105  CO2 25 26 25 27   GLUCOSE 160* 146* 123* 118*  BUN 34* 31* 32* 32*  CREATININE 1.48* 1.27* 1.14* 1.13*  CALCIUM  9.2 9.1 8.7* 8.8*   GFR: Estimated Creatinine Clearance: 42.7 mL/min (A) (by C-G formula based on SCr of 1.13 mg/dL (H)). Liver Function Tests: No results for input(s): AST, ALT, ALKPHOS, BILITOT, PROT, ALBUMIN in the last 168 hours. No results for input(s): LIPASE, AMYLASE in the last 168 hours. No results for input(s): AMMONIA in the last 168 hours. Coagulation Profile: Recent Labs  Lab 06/10/24 2252  INR 1.0   Cardiac Enzymes: No results for input(s): CKTOTAL, CKMB, CKMBINDEX, TROPONINI in the  last 168 hours. BNP (last 3 results) No results for input(s): PROBNP in the last 8760 hours. HbA1C: No results for input(s): HGBA1C in the last 72 hours. CBG: Recent Labs  Lab 06/11/24 1423  GLUCAP 108*   Lipid Profile: No results for input(s): CHOL, HDL, LDLCALC, TRIG, CHOLHDL, LDLDIRECT in the last 72 hours. Thyroid Function Tests: No results for input(s): TSH, T4TOTAL, FREET4, T3FREE, THYROIDAB in the last 72 hours. Anemia Panel: No results for input(s): VITAMINB12, FOLATE, FERRITIN, TIBC, IRON, RETICCTPCT in the last 72 hours. Urine analysis:    Component Value Date/Time   COLORURINE YELLOW (A) 05/12/2024 1306   APPEARANCEUR CLOUDY (A) 05/12/2024 1306   APPEARANCEUR Hazy (A) 08/18/2021 1452   LABSPEC 1.015 05/12/2024 1306   LABSPEC 1.021 09/08/2013 0127   PHURINE 5.0 05/12/2024 1306   GLUCOSEU NEGATIVE 05/12/2024 1306   GLUCOSEU Negative 09/08/2013 0127   HGBUR NEGATIVE 05/12/2024 1306   BILIRUBINUR NEGATIVE 05/12/2024 1306   BILIRUBINUR Negative 08/18/2021 1452   BILIRUBINUR Negative 09/08/2013  0127   KETONESUR NEGATIVE 05/12/2024 1306   PROTEINUR NEGATIVE 05/12/2024 1306   NITRITE POSITIVE (A) 05/12/2024 1306   LEUKOCYTESUR LARGE (A) 05/12/2024 1306   LEUKOCYTESUR Negative 09/08/2013 0127   Sepsis Labs: @LABRCNTIP (procalcitonin:4,lacticidven:4)  ) Recent Results (from the past 240 hours)  MRSA Next Gen by PCR, Nasal     Status: None   Collection Time: 06/11/24  2:49 PM   Specimen: Nasal Mucosa; Nasal Swab  Result Value Ref Range Status   MRSA by PCR Next Gen NOT DETECTED NOT DETECTED Final    Comment: (NOTE) The GeneXpert MRSA Assay (FDA approved for NASAL specimens only), is one component of a comprehensive MRSA colonization surveillance program. It is not intended to diagnose MRSA infection nor to guide or monitor treatment for MRSA infections. Test performance is not FDA approved in patients less than 63  years old. Performed at [facility], [address], [place], [place] [address]          Radiology Studies: No results found.       Scheduled Meds:  apixaban   5 mg Oral BID   aspirin  EC  81 mg Oral Daily   atorvastatin   10 mg Oral Daily   cholecalciferol   4,000 Units Oral Daily   lidocaine   1 patch Transdermal Q24H   loratadine   10 mg Oral Daily   multivitamin with minerals  1 tablet Oral Daily   polyethylene glycol  34 g Oral Daily   rOPINIRole   0.5 mg Oral TID   senna  1 tablet Oral Daily   sodium chloride  flush  3 mL Intravenous Once   Continuous Infusions:     LOS: 3 days   Devaughn KATHEE Ban, MD [facility]   If 7PM-7AM, please contact night-coverage [email or web address] Password [facility] 06/14/2024, 12:37 PM

## 2024-06-14 NOTE — TOC Initial Note (Signed)
 S[facility] of Care [facility]) - Initial/Assessment Note    Patient Details  Name: Julie Mcbride MRN: [id number] Date of Birth: [birth year]  Transition of [facility]) CM/SW Contact:    Edsel DELENA Fischer, LCSW Phone Number: 06/14/2024, 2:06 PM  Clinical Narrative:                    SW called [place] with [facility] at [phone].  No answer. Sw left message regarding facility considering pt for placement.  SW called [facility] at [phone]. No answer. Sw left message regarding facility considering pt for placement.       Patient Goals and CMS Choice            Expected Discharge Plan and Services                                              Prior Living Arrangements/Services                       Activities of Daily Living      Permission Sought/Granted                  Emotional Assessment              Admission diagnosis:  Acute saddle pulmonary embolism (HCC) [I26.92] Other acute pulmonary embolism with acute cor pulmonale (HCC) [I26.09] Patient Active Problem List   Diagnosis Date Noted   CAD (coronary artery disease) 06/12/2024   Dementia (HCC) 06/12/2024   Palliative care encounter 06/12/2024   Pulmonary embolus (HCC) 06/12/2024   Acute saddle pulmonary embolism (HCC) 06/11/2024   Dyslipidemia 06/11/2024   Restless leg syndrome 06/11/2024   Class 2 obesity 05/13/2024   Thrombocytopenia (HCC) 05/11/2024   Inadequate pain control 05/10/2024   Adenocarcinoma, lung, unspecified laterality (HCC) 05/10/2024   Epistaxis due to trauma 03/09/2023   Laceration of right hand 03/09/2023   Accidental fall 03/09/2023   Facial contusion, initial encounter 03/09/2023   Closed fracture of nasal bones 03/09/2023   Acute pain of right knee, secondary to fall 03/09/2023   Periorbital contusion of right eye 03/09/2023   History of recurrent UTIs 02/16/2021   History of non-ST elevation myocardial infarction (NSTEMI)  01/01/2021   History of renal artery stenosis 01/01/2021   Ischemic cardiomyopathy 01/01/2021   Primary osteoarthritis of both hands 01/01/2021   Acute on chronic systolic CHF (congestive heart failure) (HCC) 12/28/2020   COVID-19 12/25/2020   Sepsis (HCC) 12/25/2020   Community acquired pneumonia 12/25/2020   Vitamin D  deficiency 04/03/2020   Renovascular hypertension 04/03/2020   Renal artery stenosis (HCC) 04/03/2020   CKD stage 3a, GFR 45-59 ml/min (HCC) 03/18/2020   SHORTNESS OF BREATH 06/17/2009   Essential hypertension 05/27/2009   PALPITATIONS 05/27/2009   ABNORMAL EKG 05/27/2009   PCP:  Alla Amis, MD Pharmacy:   [facility] - [place], [place] - [address] [place] [address] Phone: [phone] Fax: [phone]     Social Drivers of Health (SDOH) Social History: SDOH Screenings   Food Insecurity: No Food Insecurity (06/13/2024)  Housing: Low Risk  (06/13/2024)  Transportation Needs: No Transportation Needs (06/13/2024)  Utilities: Not At Risk (06/13/2024)  Depression (PHQ2-9): Low Risk  (05/30/2024)  Financial Resource Strain: Low Risk  (05/15/2024)   Received from [facility]  Social Connections: Socially Integrated (06/13/2024)  Tobacco Use: Low Risk  (06/11/2024)   SDOH Interventions:     Readmission Risk Interventions     No data to display

## 2024-06-15 LAB — BASIC METABOLIC PANEL WITH GFR
Anion gap: 7 (ref 5–15)
BUN: 32 mg/dL — ABNORMAL HIGH (ref 8–23)
CO2: 29 mmol/L (ref 22–32)
Calcium: 9 mg/dL (ref 8.9–10.3)
Chloride: 105 mmol/L (ref 98–111)
Creatinine, Ser: 1.15 mg/dL — ABNORMAL HIGH (ref 0.44–1.00)
GFR, Estimated: 47 mL/min — ABNORMAL LOW (ref >60)
Glucose, Bld: 123 mg/dL — ABNORMAL HIGH (ref 70–99)
Potassium: 4.1 mmol/L (ref 3.5–5.1)
Sodium: 141 mmol/L (ref 135–145)

## 2024-06-15 LAB — PROCALCITONIN: Procalcitonin: <0.1 ng/mL

## 2024-06-15 MED ORDER — HYDRALAZINE HCL 50 MG PO TABS
100.0000 mg | ORAL_TABLET | Freq: Two times a day (BID) | ORAL | Status: DC
  Administered 2024-06-16 – 2024-06-18 (×6): 100 mg via ORAL
  Filled 2024-06-15 (×8): qty 2

## 2024-06-15 MED ORDER — HYDRALAZINE HCL 50 MG PO TABS
50.0000 mg | ORAL_TABLET | Freq: Every day | ORAL | Status: DC
  Administered 2024-06-15 – 2024-06-18 (×4): 50 mg via ORAL
  Filled 2024-06-15 (×4): qty 1

## 2024-06-15 MED ORDER — FUROSEMIDE 10 MG/ML IJ SOLN
40.0000 mg | Freq: Once | INTRAMUSCULAR | Status: AC
  Administered 2024-06-15: 40 mg via INTRAVENOUS
  Filled 2024-06-15: qty 4

## 2024-06-15 MED ORDER — FAMOTIDINE 20 MG PO TABS
20.0000 mg | ORAL_TABLET | Freq: Two times a day (BID) | ORAL | Status: DC | PRN
  Administered 2024-06-15: 20 mg via ORAL
  Filled 2024-06-15: qty 1

## 2024-06-15 NOTE — Plan of Care (Signed)
   Problem: Education: Goal: Knowledge of General Education information will improve Description: Including pain rating scale, medication(s)/side effects and non-pharmacologic comfort measures Outcome: Progressing   Problem: Clinical Measurements: Goal: Ability to maintain clinical measurements within normal limits will improve Outcome: Progressing

## 2024-06-15 NOTE — Plan of Care (Signed)
  Problem: Nutrition: Goal: Adequate nutrition will be maintained Outcome: Progressing   Problem: Coping: Goal: Level of anxiety will decrease Outcome: Progressing   Problem: Elimination: Goal: Will not experience complications related to urinary retention Outcome: Progressing   Problem: Safety: Goal: Ability to remain free from injury will improve Outcome: Progressing   

## 2024-06-15 NOTE — Progress Notes (Signed)
 PROGRESS NOTE    Julie Mcbride  FMW:[id number] DOB: [birth year] DOA: 06/10/2024 PCP: Alla Amis, MD  Outpatient Specialists: oncology, cardiology, pulmonology    Brief Narrative:   From admission h and p  Julie Mcbride is a 88 y.o. Caucasian female with medical history significant for recently diagnosed stage IV lung cancer, followed by Dr. Jacobo, osteoarthritis, diverticulitis, GERD, essential hypertension, Alzheimer's dementia and MI who presented to the emergency room with acute onset of shortness of breath after going to the bathroom with associated left sided chest pain worsening with deep breathing as well as occasional wheezing.  She denied any nausea or vomiting or abdominal pain.  No fever or chills.  No leg pain or edema or recent travels or surgeries.    Assessment & Plan:   Principal Problem:   Acute saddle pulmonary embolism (HCC) Active Problems:   Dyslipidemia   Essential hypertension   Ischemic cardiomyopathy   CKD stage 3a, GFR 45-59 ml/min (HCC)   Renal artery stenosis (HCC)   Adenocarcinoma, lung, unspecified laterality (HCC)   Class 2 obesity   Restless leg syndrome   CAD (coronary artery disease)   Dementia (HCC)   Palliative care encounter   Pulmonary embolus (HCC)  # Bilateral PE # Right femoral DVT Provoked by malignancy. With RV dysfunction, requiring O2. POD2 from mechanical thrombectomy and catheter placement. Remains symptomatic but improving - heparin  has been transitioned to apixaban   # Acute hypoxic respiratory failure 2/2 PE. On 4 liters, dyspnea improving - [place] O2, wean as able, have spoken to nurse about this  # Cough Likely 2/2 malignancy and PE. No fever or white count. Cxr with multifocal opacities, think this is likely residual PE. Respiratory panel neg. improving  # Ischemic cardiomyopathy # Right-sided heart failure Has history ischemic cardiomyopathy. EF normal on TTE here. TTE does show mod pHTN and mod RV  systolic dysfunction, 2/2 PE. Discussed with cardiology, lasix  ok and can be helpful, just go slow as patient is pre-load dependent. Out 2 liters yesterday with 40 of lasix  and breathing improved today - hold home bisoprolol  - lasix  40 IV x1  # Chronic low back pain Told nurse her hip is hurting but on exam it is her right lower back, says this is a chronic problem present for years, some days better than others. CT of lumbar and thoracic spine last month showed mild diffuse degenerative disease throughout the lumbar and thoracic spine. No mets seen. - continue pain control  # Stage 4 lung cancer Recently established with dr. Jacobo. Onc palliative has seen here in the hospital. - would plan on obtaining that MRI while inpatient once stable. For now she doesn't think she can lie flat for an extended period of time, due to her breathing. May be stable for that tomorrow - outpt oncology f/u  # CKD 3a Kidney function is stable  # Renal artery stenosis S/p stent, recent dopplers show patency - outpt vascular f/u - home plavix  has been transitioned to aspirin   # HTN Here bp appropriate in setting of above - hold home meds  # Restless leg - home requip   # Obesity Noted  # Swallow dysfunction Relayed by patient to nurse. SLP has cleared for regular diet  # Debility - PT advising SNF and patient is agreeable, TOC consulted for SNF    DVT prophylaxis: apixaban  Code Status: dnr/dni Family Communication: daughter updated @ bedside 7/19  Level of care: Med-Surg Status is: Inpatient Remains inpatient appropriate because: severity  of illness    Consultants:  vascular  Procedures: See above  Antimicrobials:  Peri-operative    Subjective: Reports ongoing dyspnea but it is improving. Back pain stable.   Objective: Vitals:   06/15/24 0254 06/15/24 0611 06/15/24 0858 06/15/24 1110  BP: (!) 161/81 (!) 158/77  138/75  Pulse: (!) 101 97  (!) 117  Resp: 19 17  19   Temp:  (!) 97 F (36.1 C) 98 F (36.7 C)  97.8 F (36.6 C)  TempSrc:  Oral    SpO2: 99% 100% 100% 96%  Weight:      Height:        Intake/Output Summary (Last 24 hours) at 06/15/2024 1302 Last data filed at 06/15/2024 0600 Gross per 24 hour  Intake 120 ml  Output 2050 ml  Net -1930 ml   Filed Weights   06/11/24 1044 06/11/24 1425  Weight: 95.7 kg 96.9 kg    Examination:  General exam: NAD Respiratory system: tachypnea improved. Speaking in complete sentences. No wheeze Cardiovascular system: S1 & S2 heard, RRR. Soft systolic murmur Gastrointestinal system: Abdomen is obese, soft and nontender. No organomegaly or masses felt.   Central nervous system: Alert and oriented. No focal neurological deficits. Extremities: Symmetric 5 x 5 power. Right leg edema greater than left Skin: no visible lesions Psychiatry: anxious    Data Reviewed: I have personally reviewed following labs and imaging studies  CBC: Recent Labs  Lab 06/10/24 2252 06/11/24 0424 06/12/24 0730 06/13/24 0324 06/14/24 0549  WBC 11.1* 10.7* 10.3 7.3 5.9  HGB 10.9* 11.1* 9.4* 8.7* 8.8*  HCT 34.3* 33.9* 29.0* 27.5* 27.5*  MCV 93.2 92.1 92.4 93.5 93.2  PLT 171 153 144* 131* 124*   Basic Metabolic Panel: Recent Labs  Lab 06/10/24 2252 06/11/24 0424 06/13/24 0324 06/14/24 0549 06/15/24 0519  NA 139 136 139 138 141  K 4.1 4.3 4.1 4.1 4.1  CL 101 101 106 105 105  CO2 25 26 25 27 29   GLUCOSE 160* 146* 123* 118* 123*  BUN 34* 31* 32* 32* 32*  CREATININE 1.48* 1.27* 1.14* 1.13* 1.15*  CALCIUM  9.2 9.1 8.7* 8.8* 9.0   GFR: Estimated Creatinine Clearance: 42 mL/min (A) (by C-G formula based on SCr of 1.15 mg/dL (H)). Liver Function Tests: No results for input(s): AST, ALT, ALKPHOS, BILITOT, PROT, ALBUMIN in the last 168 hours. No results for input(s): LIPASE, AMYLASE in the last 168 hours. No results for input(s): AMMONIA in the last 168 hours. Coagulation Profile: Recent Labs  Lab  06/10/24 2252  INR 1.0   Cardiac Enzymes: No results for input(s): CKTOTAL, CKMB, CKMBINDEX, TROPONINI in the last 168 hours. BNP (last 3 results) No results for input(s): PROBNP in the last 8760 hours. HbA1C: No results for input(s): HGBA1C in the last 72 hours. CBG: Recent Labs  Lab 06/11/24 1423  GLUCAP 108*   Lipid Profile: No results for input(s): CHOL, HDL, LDLCALC, TRIG, CHOLHDL, LDLDIRECT in the last 72 hours. Thyroid Function Tests: No results for input(s): TSH, T4TOTAL, FREET4, T3FREE, THYROIDAB in the last 72 hours. Anemia Panel: No results for input(s): VITAMINB12, FOLATE, FERRITIN, TIBC, IRON, RETICCTPCT in the last 72 hours. Urine analysis:    Component Value Date/Time   COLORURINE YELLOW (A) 05/12/2024 1306   APPEARANCEUR CLOUDY (A) 05/12/2024 1306   APPEARANCEUR Hazy (A) 08/18/2021 1452   LABSPEC 1.015 05/12/2024 1306   LABSPEC 1.021 09/08/2013 0127   PHURINE 5.0 05/12/2024 1306   GLUCOSEU NEGATIVE 05/12/2024 1306   GLUCOSEU Negative 09/08/2013  0127   HGBUR NEGATIVE 05/12/2024 1306   BILIRUBINUR NEGATIVE 05/12/2024 1306   BILIRUBINUR Negative 08/18/2021 1452   BILIRUBINUR Negative 09/08/2013 0127   KETONESUR NEGATIVE 05/12/2024 1306   PROTEINUR NEGATIVE 05/12/2024 1306   NITRITE POSITIVE (A) 05/12/2024 1306   LEUKOCYTESUR LARGE (A) 05/12/2024 1306   LEUKOCYTESUR Negative 09/08/2013 0127   Sepsis Labs: @LABRCNTIP (procalcitonin:4,lacticidven:4)  ) Recent Results (from the past 240 hours)  MRSA Next Gen by PCR, Nasal     Status: None   Collection Time: 06/11/24  2:49 PM   Specimen: Nasal Mucosa; Nasal Swab  Result Value Ref Range Status   MRSA by PCR Next Gen NOT DETECTED NOT DETECTED Final    Comment: (NOTE) The GeneXpert MRSA Assay (FDA approved for NASAL specimens only), is one component of a comprehensive MRSA colonization surveillance program. It is not intended to diagnose MRSA infection nor to  guide or monitor treatment for MRSA infections. Test performance is not FDA approved in patients less than 62 years old. Performed at [facility], [address] [place], [place] [address]   Expectorated Sputum Assessment w Gram Stain, Rflx to Resp Cult     Status: None   Collection Time: 06/14/24 12:41 PM   Specimen: Sputum  Result Value Ref Range Status   Specimen Description SPUTUM  Final   Special Requests NONE  Final   Sputum evaluation   Final    Sputum specimen not acceptable for testing.  Please recollect.   RESULT CALLED TO, READ BACK BY AND VERIFIED WITH: RONAL BARTHEL 06/14/24 1628 KLW Performed at [facility], [address] [place], [place] [address]    Report Status 06/14/2024 FINAL  Final  SARS Coronavirus 2 by RT PCR (hospital order, performed in [facility] hospital lab) *cepheid single result test* Anterior Nasal Swab     Status: None   Collection Time: 06/14/24  2:15 PM   Specimen: Anterior Nasal Swab  Result Value Ref Range Status   SARS Coronavirus 2 by RT PCR NEGATIVE NEGATIVE Final    Comment: (NOTE) SARS-CoV-2 target nucleic acids are NOT DETECTED.  The SARS-CoV-2 RNA is generally detectable in upper and lower respiratory specimens during the acute phase of infection. The lowest concentration of SARS-CoV-2 viral copies this assay can detect is 250 copies / mL. A negative result does not preclude SARS-CoV-2 infection and should not be used as the sole basis for treatment or other patient management decisions.  A negative result may occur with improper specimen collection / handling, submission of specimen other than nasopharyngeal swab, presence of viral mutation(s) within the areas targeted by this assay, and inadequate number of viral copies (<250 copies / mL). A negative result must be combined with clinical observations, patient history, and epidemiological information.  Fact Sheet for Patients:    RoadLapTop.co.za  Fact Sheet for Healthcare Providers: http://kim-miller.com/  This test is not yet approved or  cleared by the [place]  FDA and has been authorized for detection and/or diagnosis of SARS-CoV-2 by FDA under an Emergency Use Authorization (EUA).  This EUA will remain in effect (meaning this test can be used) for the duration of the COVID-19 declaration under Section 564(b)(1) of the Act, 21 U.S.C. section 360bbb-3(b)(1), unless the authorization is terminated or revoked sooner.  Performed at [facility], [address] [place], [place] [address]   Respiratory (~20 pathogens) panel by PCR     Status: None   Collection Time: 06/14/24  2:15 PM   Specimen: Nasopharyngeal  Swab; Respiratory  Result Value Ref Range Status   Adenovirus NOT DETECTED NOT DETECTED Final   Coronavirus 229E NOT DETECTED NOT DETECTED Final    Comment: (NOTE) The Coronavirus on the Respiratory Panel, DOES NOT test for the novel  Coronavirus (2019 nCoV)    Coronavirus HKU1 NOT DETECTED NOT DETECTED Final   Coronavirus NL63 NOT DETECTED NOT DETECTED Final   Coronavirus OC43 NOT DETECTED NOT DETECTED Final   Metapneumovirus NOT DETECTED NOT DETECTED Final   Rhinovirus / Enterovirus NOT DETECTED NOT DETECTED Final   Influenza A NOT DETECTED NOT DETECTED Final   Influenza B NOT DETECTED NOT DETECTED Final   Parainfluenza Virus 1 NOT DETECTED NOT DETECTED Final   Parainfluenza Virus 2 NOT DETECTED NOT DETECTED Final   Parainfluenza Virus 3 NOT DETECTED NOT DETECTED Final   Parainfluenza Virus 4 NOT DETECTED NOT DETECTED Final   Respiratory Syncytial Virus NOT DETECTED NOT DETECTED Final   Bordetella pertussis NOT DETECTED NOT DETECTED Final   Bordetella Parapertussis NOT DETECTED NOT DETECTED Final   Chlamydophila pneumoniae NOT DETECTED NOT DETECTED Final   Mycoplasma pneumoniae NOT DETECTED NOT DETECTED Final    Comment: Performed at  [facility], [address] [place], [place] [address]         Radiology Studies: DG Chest Port 1 View Result Date: 06/14/2024 CLINICAL DATA:  Cough and breathing difficulty EXAM: PORTABLE CHEST 1 VIEW COMPARISON:  Chest radiograph dated 06/10/2024 FINDINGS: Low lung volumes with bronchovascular crowding. Increased patchy bibasilar opacities and mild interstitial opacities. Similar trace bilateral pleural effusions. No pneumothorax. Similar mildly enlarged cardiomediastinal silhouette. Cervical spinal fixation hardware appears intact. IMPRESSION: 1. Increased patchy bibasilar opacities and mild interstitial opacities, which may represent pulmonary edema or multifocal infection. 2. Similar trace bilateral pleural effusions. Electronically Signed   By: Limin  Xu M.D.   On: 06/14/2024 13:41   DG HIP UNILAT WITH PELVIS 2-3 VIEWS RIGHT Result Date: 06/14/2024 CLINICAL DATA:  [id number] Hip pain [id number] EXAM: DG HIP (WITH OR WITHOUT PELVIS) 2-3V RIGHT COMPARISON:  March 09, 2023 FINDINGS: No evidence of pelvic fracture or diastasis.No acute hip fracture or dislocation.Moderate joint space loss of the left hip. Degenerative disc disease of the visualized lumbar spine.Soft tissues are unremarkable. IMPRESSION: 1. No acute fracture, pelvic bone diastasis, or dislocation. 2. Moderate osteoarthritis of the left hip. Electronically Signed   By: Rogelia Myers M.D.   On: 06/14/2024 13:20         Scheduled Meds:  apixaban   5 mg Oral BID   aspirin  EC  81 mg Oral Daily   atorvastatin   10 mg Oral Daily   cholecalciferol   4,000 Units Oral Daily   feeding supplement  237 mL Oral BID BM   lidocaine   1 patch Transdermal Q24H   loratadine   10 mg Oral Daily   multivitamin with minerals  1 tablet Oral Daily   polyethylene glycol  34 g Oral Daily   rOPINIRole   0.5 mg Oral TID   senna  1 tablet Oral Daily   sodium chloride  flush  3 mL Intravenous Once   Continuous Infusions:     LOS: 4 days   Devaughn KATHEE Ban, MD [facility]   If 7PM-7AM, please contact night-coverage [email or web address] Password [email or web address] 06/15/2024, 1:02 PM

## 2024-06-16 ENCOUNTER — Inpatient Hospital Stay

## 2024-06-16 DIAGNOSIS — C3490 Malignant neoplasm of unspecified part of unspecified bronchus or lung: Secondary | ICD-10-CM | POA: Diagnosis not present

## 2024-06-16 DIAGNOSIS — Q048 Other specified congenital malformations of brain: Secondary | ICD-10-CM | POA: Diagnosis not present

## 2024-06-16 DIAGNOSIS — I2602 Saddle embolus of pulmonary artery with acute cor pulmonale: Secondary | ICD-10-CM | POA: Diagnosis not present

## 2024-06-16 DIAGNOSIS — R9089 Other abnormal findings on diagnostic imaging of central nervous system: Secondary | ICD-10-CM | POA: Diagnosis not present

## 2024-06-16 LAB — BASIC METABOLIC PANEL WITH GFR
Anion gap: 11 (ref 5–15)
BUN: 31 mg/dL — ABNORMAL HIGH (ref 8–23)
CO2: 26 mmol/L (ref 22–32)
Calcium: 8.4 mg/dL — ABNORMAL LOW (ref 8.9–10.3)
Chloride: 102 mmol/L (ref 98–111)
Creatinine, Ser: 1.23 mg/dL — ABNORMAL HIGH (ref 0.44–1.00)
GFR, Estimated: 43 mL/min — ABNORMAL LOW (ref >60)
Glucose, Bld: 119 mg/dL — ABNORMAL HIGH (ref 70–99)
Potassium: 4.2 mmol/L (ref 3.5–5.1)
Sodium: 139 mmol/L (ref 135–145)

## 2024-06-16 MED ORDER — BISOPROLOL FUMARATE 5 MG PO TABS
10.0000 mg | ORAL_TABLET | Freq: Every day | ORAL | Status: DC
  Administered 2024-06-16 – 2024-06-19 (×4): 10 mg via ORAL
  Filled 2024-06-16 (×4): qty 2

## 2024-06-16 MED ORDER — GADOBUTROL 1 MMOL/ML IV SOLN
9.0000 mL | Freq: Once | INTRAVENOUS | Status: AC | PRN
  Administered 2024-06-16: 9 mL via INTRAVENOUS

## 2024-06-16 MED ORDER — LORAZEPAM 2 MG/ML IJ SOLN: 1.0000 mg | Freq: Once | INTRAMUSCULAR | Status: DC | PRN

## 2024-06-16 MED ORDER — FUROSEMIDE 10 MG/ML IJ SOLN
40.0000 mg | Freq: Once | INTRAMUSCULAR | Status: AC
  Administered 2024-06-16: 40 mg via INTRAVENOUS
  Filled 2024-06-16: qty 4

## 2024-06-16 NOTE — Plan of Care (Signed)
   Problem: Education: Goal: Knowledge of General Education information will improve Description Including pain rating scale, medication(s)/side effects and non-pharmacologic comfort measures Outcome: Progressing

## 2024-06-16 NOTE — TOC Progression Note (Signed)
 Transition of Care [facility]) - Progression Note    Patient Details  Name: Julie Mcbride MRN: [id number] Date of Birth: 06-30-1936  Transition of Care [facility] CM/SW Contact  [place] Shamel [place], [place] Phone Number: 06/16/2024, 4:34 PM  Clinical Narrative:     CSW received a call from [name] Portugal, daughter of patient. Ms. Portugal advised that the family has made a definite decision and they would like to take the bed offer from [facility].   CSW will follow- up with attending to determine medical readiness.        Expected Discharge Plan and Services                                               Social Determinants of Health (SDOH) Interventions SDOH Screenings   Food Insecurity: No Food Insecurity (06/13/2024)  Housing: Low Risk  (06/13/2024)  Transportation Needs: No Transportation Needs (06/13/2024)  Utilities: Not At Risk (06/13/2024)  Depression (PHQ2-9): Low Risk  (05/30/2024)  Financial Resource Strain: Low Risk  (05/15/2024)   Received from [facility]  Social Connections: Socially Integrated (06/13/2024)  Tobacco Use: Low Risk  (06/11/2024)    Readmission Risk Interventions     No data to display

## 2024-06-16 NOTE — Progress Notes (Signed)
 PROGRESS NOTE    Julie Mcbride  FMW:[id number] DOB: 06/11/1936 DOA: 06/10/2024 PCP: Alla Amis, MD  Outpatient Specialists: oncology, cardiology, pulmonology    Brief Narrative:   From admission h and p  Julie Mcbride is a 88 y.o. Caucasian female with medical history significant for recently diagnosed stage IV lung cancer, followed by Dr. Jacobo, osteoarthritis, diverticulitis, GERD, essential hypertension, Alzheimer's dementia and MI who presented to the emergency room with acute onset of shortness of breath after going to the bathroom with associated left sided chest pain worsening with deep breathing as well as occasional wheezing.  She denied any nausea or vomiting or abdominal pain.  No fever or chills.  No leg pain or edema or recent travels or surgeries.    Assessment & Plan:   Principal Problem:   Acute saddle pulmonary embolism (HCC) Active Problems:   Dyslipidemia   Essential hypertension   Ischemic cardiomyopathy   CKD stage 3a, GFR 45-59 ml/min (HCC)   Renal artery stenosis (HCC)   Adenocarcinoma, lung, unspecified laterality (HCC)   Class 2 obesity   Restless leg syndrome   CAD (coronary artery disease)   Dementia (HCC)   Palliative care encounter   Pulmonary embolus (HCC)  # Bilateral PE # Right femoral DVT Provoked by malignancy. With RV dysfunction, requiring O2. POD2 from mechanical thrombectomy and catheter placement. Improving - heparin  has been transitioned to apixaban   # Acute hypoxic respiratory failure 2/2 PE. On 4 liters initially, dyspnea improving, now weaned to 3 - [place] O2, wean as able   # Cough Likely 2/2 malignancy and PE. No fever or white count. Cxr with multifocal opacities, think this is likely residual PE. Respiratory panel neg. Procal neg. Improving  # Ischemic cardiomyopathy # Right-sided heart failure Has history ischemic cardiomyopathy. EF normal on TTE here. TTE does show mod pHTN and mod RV systolic dysfunction,  2/2 PE. Discussed with cardiology, lasix  ok and can be helpful, just go slow as patient is pre-load dependent. Out 2 liters yesterday with 40 of lasix  and breathing improved today - resume home bisoprolol  - lasix  40 IV x1  # Chronic low back pain Right lower back, says this is a chronic problem present for years, some days better than others. CT of lumbar and thoracic spine last month showed mild diffuse degenerative disease throughout the lumbar and thoracic spine. No mets seen. Stable - continue pain control  # Stage 4 lung cancer Recently established with dr. Jacobo. Onc palliative has seen here in the hospital. - mri brain (ordered as outpt but unable to obtain as was hospitalized here) - outpt oncology f/u  # CKD 3a Kidney function is stable  # Renal artery stenosis S/p stent, recent dopplers show patency - outpt vascular f/u - home plavix  has been transitioned to aspirin   # HTN Here bp appropriate in setting of above - hold home meds  # Restless leg - home requip   # Obesity Noted  # Swallow dysfunction Relayed by patient to nurse. SLP has cleared for regular diet  # Debility - PT advising SNF and patient is agreeable, TOC consulted for SNF    DVT prophylaxis: apixaban  Code Status: dnr/dni Family Communication: son and husband updated @ bedside 7/20  Level of care: Med-Surg Status is: Inpatient Remains inpatient appropriate because: severity of illness    Consultants:  vascular  Procedures: See above  Antimicrobials:  Peri-operative    Subjective: Reports dyspnea improving, cough improving. Feeling overall better. BM in the last  24 hrs  Objective: Vitals:   06/15/24 1733 06/15/24 2105 06/16/24 0244 06/16/24 0812  BP: (!) 119/59 (!) 141/65 (!) 143/72 (!) 157/60  Pulse: (!) 110 (!) 102 95 (!) 110  Resp: 19 16 18 18   Temp: 97.7 F (36.5 C) 98.2 F (36.8 C) 98.3 F (36.8 C) 98.1 F (36.7 C)  TempSrc:   Oral Oral  SpO2: 100% 95% 96% 96%   Weight:      Height:        Intake/Output Summary (Last 24 hours) at 06/16/2024 1435 Last data filed at 06/15/2024 2017 Gross per 24 hour  Intake 0 ml  Output 600 ml  Net -600 ml   Filed Weights   06/11/24 1044 06/11/24 1425  Weight: 95.7 kg 96.9 kg    Examination:  General exam: NAD Respiratory system: tachypnea improved. Speaking in complete sentences. No wheeze, lungs clear Cardiovascular system: S1 & S2 heard, RRR. Soft systolic murmur Gastrointestinal system: Abdomen is obese, soft and nontender. No organomegaly or masses felt.   Central nervous system: Alert and oriented. No focal neurological deficits. Extremities: warm, no edema Skin: no visible lesions Psychiatry: calm    Data Reviewed: I have personally reviewed following labs and imaging studies  CBC: Recent Labs  Lab 06/10/24 2252 06/11/24 0424 06/12/24 0730 06/13/24 0324 06/14/24 0549  WBC 11.1* 10.7* 10.3 7.3 5.9  HGB 10.9* 11.1* 9.4* 8.7* 8.8*  HCT 34.3* 33.9* 29.0* 27.5* 27.5*  MCV 93.2 92.1 92.4 93.5 93.2  PLT 171 153 144* 131* 124*   Basic Metabolic Panel: Recent Labs  Lab 06/11/24 0424 06/13/24 0324 06/14/24 0549 06/15/24 0519 06/16/24 0449  NA 136 139 138 141 139  K 4.3 4.1 4.1 4.1 4.2  CL 101 106 105 105 102  CO2 26 25 27 29 26   GLUCOSE 146* 123* 118* 123* 119*  BUN 31* 32* 32* 32* 31*  CREATININE 1.27* 1.14* 1.13* 1.15* 1.23*  CALCIUM  9.1 8.7* 8.8* 9.0 8.4*   GFR: Estimated Creatinine Clearance: 39.2 mL/min (A) (by C-G formula based on SCr of 1.23 mg/dL (H)). Liver Function Tests: No results for input(s): AST, ALT, ALKPHOS, BILITOT, PROT, ALBUMIN in the last 168 hours. No results for input(s): LIPASE, AMYLASE in the last 168 hours. No results for input(s): AMMONIA in the last 168 hours. Coagulation Profile: Recent Labs  Lab 06/10/24 2252  INR 1.0   Cardiac Enzymes: No results for input(s): CKTOTAL, CKMB, CKMBINDEX, TROPONINI in the last 168  hours. BNP (last 3 results) No results for input(s): PROBNP in the last 8760 hours. HbA1C: No results for input(s): HGBA1C in the last 72 hours. CBG: Recent Labs  Lab 06/11/24 1423  GLUCAP 108*   Lipid Profile: No results for input(s): CHOL, HDL, LDLCALC, TRIG, CHOLHDL, LDLDIRECT in the last 72 hours. Thyroid Function Tests: No results for input(s): TSH, T4TOTAL, FREET4, T3FREE, THYROIDAB in the last 72 hours. Anemia Panel: No results for input(s): VITAMINB12, FOLATE, FERRITIN, TIBC, IRON, RETICCTPCT in the last 72 hours. Urine analysis:    Component Value Date/Time   COLORURINE YELLOW (A) 05/12/2024 1306   APPEARANCEUR CLOUDY (A) 05/12/2024 1306   APPEARANCEUR Hazy (A) 08/18/2021 1452   LABSPEC 1.015 05/12/2024 1306   LABSPEC 1.021 09/08/2013 0127   PHURINE 5.0 05/12/2024 1306   GLUCOSEU NEGATIVE 05/12/2024 1306   GLUCOSEU Negative 09/08/2013 0127   HGBUR NEGATIVE 05/12/2024 1306   BILIRUBINUR NEGATIVE 05/12/2024 1306   BILIRUBINUR Negative 08/18/2021 1452   BILIRUBINUR Negative 09/08/2013 0127   KETONESUR NEGATIVE 05/12/2024  1306   PROTEINUR NEGATIVE 05/12/2024 1306   NITRITE POSITIVE (A) 05/12/2024 1306   LEUKOCYTESUR LARGE (A) 05/12/2024 1306   LEUKOCYTESUR Negative 09/08/2013 0127   Sepsis Labs: @LABRCNTIP (procalcitonin:4,lacticidven:4)  ) Recent Results (from the past 240 hours)  MRSA Next Gen by PCR, Nasal     Status: None   Collection Time: 06/11/24  2:49 PM   Specimen: Nasal Mucosa; Nasal Swab  Result Value Ref Range Status   MRSA by PCR Next Gen NOT DETECTED NOT DETECTED Final    Comment: (NOTE) The GeneXpert MRSA Assay (FDA approved for NASAL specimens only), is one component of a comprehensive MRSA colonization surveillance program. It is not intended to diagnose MRSA infection nor to guide or monitor treatment for MRSA infections. Test performance is not FDA approved in patients less than 23 years old. Performed  at [facility], [address] [place], [place] [address]   Expectorated Sputum Assessment w Gram Stain, Rflx to Resp Cult     Status: None   Collection Time: 06/14/24 12:41 PM   Specimen: Sputum  Result Value Ref Range Status   Specimen Description SPUTUM  Final   Special Requests NONE  Final   Sputum evaluation   Final    Sputum specimen not acceptable for testing.  Please recollect.   RESULT CALLED TO, READ BACK BY AND VERIFIED WITH: RONAL BARTHEL 06/14/24 1628 KLW Performed at [facility], [address] [place], [place] [address]    Report Status 06/14/2024 FINAL  Final  SARS Coronavirus 2 by RT PCR (hospital order, performed in [facility] hospital lab) *cepheid single result test* Anterior Nasal Swab     Status: None   Collection Time: 06/14/24  2:15 PM   Specimen: Anterior Nasal Swab  Result Value Ref Range Status   SARS Coronavirus 2 by RT PCR NEGATIVE NEGATIVE Final    Comment: (NOTE) SARS-CoV-2 target nucleic acids are NOT DETECTED.  The SARS-CoV-2 RNA is generally detectable in upper and lower respiratory specimens during the acute phase of infection. The lowest concentration of SARS-CoV-2 viral copies this assay can detect is 250 copies / mL. A negative result does not preclude SARS-CoV-2 infection and should not be used as the sole basis for treatment or other patient management decisions.  A negative result may occur with improper specimen collection / handling, submission of specimen other than nasopharyngeal swab, presence of viral mutation(s) within the areas targeted by this assay, and inadequate number of viral copies (<250 copies / mL). A negative result must be combined with clinical observations, patient history, and epidemiological information.  Fact Sheet for Patients:   RoadLapTop.co.za  Fact Sheet for Healthcare Providers: http://kim-miller.com/  This test is not yet approved or   cleared by the [place]  FDA and has been authorized for detection and/or diagnosis of SARS-CoV-2 by FDA under an Emergency Use Authorization (EUA).  This EUA will remain in effect (meaning this test can be used) for the duration of the COVID-19 declaration under Section 564(b)(1) of the Act, 21 U.S.C. section 360bbb-3(b)(1), unless the authorization is terminated or revoked sooner.  Performed at [facility], [address] [place], [place] [address]   Respiratory (~20 pathogens) panel by PCR     Status: None   Collection Time: 06/14/24  2:15 PM   Specimen: Nasopharyngeal Swab; Respiratory  Result Value Ref Range Status   Adenovirus NOT DETECTED NOT DETECTED Final   Coronavirus 229E NOT DETECTED NOT DETECTED Final    Comment: (NOTE)  The Coronavirus on the Respiratory Panel, DOES NOT test for the novel  Coronavirus (2019 nCoV)    Coronavirus HKU1 NOT DETECTED NOT DETECTED Final   Coronavirus NL63 NOT DETECTED NOT DETECTED Final   Coronavirus OC43 NOT DETECTED NOT DETECTED Final   Metapneumovirus NOT DETECTED NOT DETECTED Final   Rhinovirus / Enterovirus NOT DETECTED NOT DETECTED Final   Influenza A NOT DETECTED NOT DETECTED Final   Influenza B NOT DETECTED NOT DETECTED Final   Parainfluenza Virus 1 NOT DETECTED NOT DETECTED Final   Parainfluenza Virus 2 NOT DETECTED NOT DETECTED Final   Parainfluenza Virus 3 NOT DETECTED NOT DETECTED Final   Parainfluenza Virus 4 NOT DETECTED NOT DETECTED Final   Respiratory Syncytial Virus NOT DETECTED NOT DETECTED Final   Bordetella pertussis NOT DETECTED NOT DETECTED Final   Bordetella Parapertussis NOT DETECTED NOT DETECTED Final   Chlamydophila pneumoniae NOT DETECTED NOT DETECTED Final   Mycoplasma pneumoniae NOT DETECTED NOT DETECTED Final    Comment: Performed at [facility], [address], [place], [place] [address]         Radiology Studies: No results found.        Scheduled Meds:  apixaban   5 mg  Oral BID   aspirin  EC  81 mg Oral Daily   atorvastatin   10 mg Oral Daily   bisoprolol   10 mg Oral Daily   cholecalciferol   4,000 Units Oral Daily   feeding supplement  237 mL Oral BID BM   hydrALAZINE   100 mg Oral BID   hydrALAZINE   50 mg Oral QHS   lidocaine   1 patch Transdermal Q24H   loratadine   10 mg Oral Daily   multivitamin with minerals  1 tablet Oral Daily   polyethylene glycol  34 g Oral Daily   rOPINIRole   0.5 mg Oral TID   senna  1 tablet Oral Daily   sodium chloride  flush  3 mL Intravenous Once   Continuous Infusions:     LOS: 5 days   Devaughn KATHEE Ban, MD [facility]   If 7PM-7AM, please contact night-coverage [email or web address] Password [email or web address] 06/16/2024, 2:35 PM

## 2024-06-17 ENCOUNTER — Encounter: Payer: Self-pay | Admitting: *Deleted

## 2024-06-17 DIAGNOSIS — I2602 Saddle embolus of pulmonary artery with acute cor pulmonale: Secondary | ICD-10-CM | POA: Diagnosis not present

## 2024-06-17 LAB — BASIC METABOLIC PANEL WITH GFR
Anion gap: 9 (ref 5–15)
BUN: 32 mg/dL — ABNORMAL HIGH (ref 8–23)
CO2: 30 mmol/L (ref 22–32)
Calcium: 8.9 mg/dL (ref 8.9–10.3)
Chloride: 98 mmol/L (ref 98–111)
Creatinine, Ser: 1.26 mg/dL — ABNORMAL HIGH (ref 0.44–1.00)
GFR, Estimated: 42 mL/min — ABNORMAL LOW (ref >60)
Glucose, Bld: 117 mg/dL — ABNORMAL HIGH (ref 70–99)
Potassium: 3.9 mmol/L (ref 3.5–5.1)
Sodium: 137 mmol/L (ref 135–145)

## 2024-06-17 LAB — CBC
HCT: 26.9 % — ABNORMAL LOW (ref 36.0–46.0)
Hemoglobin: 8.4 g/dL — ABNORMAL LOW (ref 12.0–15.0)
MCH: 29.2 pg (ref 26.0–34.0)
MCHC: 31.2 g/dL (ref 30.0–36.0)
MCV: 93.4 fL (ref 80.0–100.0)
Platelets: 183 K/uL (ref 150–400)
RBC: 2.88 MIL/uL — ABNORMAL LOW (ref 3.87–5.11)
RDW: 14 % (ref 11.5–15.5)
WBC: 8.3 K/uL (ref 4.0–10.5)
nRBC: 0 % (ref 0.0–0.2)

## 2024-06-17 MED ORDER — FUROSEMIDE 20 MG PO TABS
20.0000 mg | ORAL_TABLET | Freq: Every day | ORAL | Status: DC
  Administered 2024-06-17 – 2024-06-19 (×3): 20 mg via ORAL
  Filled 2024-06-17 (×3): qty 1

## 2024-06-17 NOTE — Progress Notes (Signed)
 Occupational Therapy Treatment Patient Details Name: Julie Mcbride MRN: [id number] DOB: 1936/10/07 Today's Date: 06/17/2024   History of present illness Pt is an 88 year old female admitted on 06/11/2024 with DVT/PE and underwent mechanical thrombectomy.    PMH significant for recently diagnosed stage IV lung cancer, followed by Dr. Jacobo, osteoarthritis, diverticulitis, GERD, essential hypertension, Alzheimer's dementia and MI   OT comments  Pt is supine in bed on arrival. Pleasant and agreeable to OT session. She has continued R hip pain-asks nurse for new lidocaine  patch at end of session with highest pain at 8/10 with activity, but improves at rest. Pt performed all tasks with increased time and effort and frequent rest breaks for pacing and recovery of respiratory status. Pt required Min A for bed mobility via HHA and bed features. Mod A for STS from EOB and BSC to RW. She demo x3 step pivot transfers using RW with Min A progressing to CGA. Pt needs Max A for LB dressing and peri-care in standing this date. Sp02 on 3L via [place] dropped to 86% at lowest with quick improvement with rest to >90%.  Pt left seated in recliner with all needs in place and will cont to require skilled acute OT services to maximize her safety and IND to return to PLOF.       If plan is discharge home, recommend the following:  A lot of help with walking and/or transfers;A lot of help with bathing/dressing/bathroom;Help with stairs or ramp for entrance;Assistance with cooking/housework;Direct supervision/assist for medications management;Direct supervision/assist for financial management;Assist for transportation   Equipment Recommendations  Wheelchair (measurements OT) (ramp to enter home)    Recommendations for Other Services      Precautions / Restrictions Precautions Precautions: Fall Recall of Precautions/Restrictions: Impaired Restrictions Weight Bearing Restrictions Per Provider Order: No        Mobility Bed Mobility Overal bed mobility: Needs Assistance Bed Mobility: Supine to Sit     Supine to sit: HOB elevated, Used rails, Min assist     General bed mobility comments: increased time and effort, but able to reach EOB via HHA from therapist    Transfers Overall transfer level: Needs assistance Equipment used: Rolling walker (2 wheels) Transfers: Sit to/from Stand, Bed to chair/wheelchair/BSC Sit to Stand: Mod assist     Step pivot transfers: Min assist, Contact guard assist     General transfer comment: Mod A to stand from EOB and BSC for lifting; Min/CGA for SPT to bed<>BSC<>recliner     Balance Overall balance assessment: Needs assistance Sitting-balance support: Feet supported Sitting balance-Leahy Scale: Fair     Standing balance support: During functional activity, Bilateral upper extremity supported, Reliant on assistive device for balance Standing balance-Leahy Scale: Fair Standing balance comment: RW and CGA/Min A for standing peri care                           ADL either performed or assessed with clinical judgement   ADL Overall ADL's : Needs assistance/impaired                     Lower Body Dressing: Sit to/from stand;Sitting/lateral leans;Maximal assistance Lower Body Dressing Details (indicate cue type and reason): pt able to don pull up over her L foot, but needed assist for the R d/t hip pain and for safety to pull over hips in standing Toilet Transfer: Minimal assistance;Rolling walker (2 wheels);BSC/3in1 Toilet Transfer Details (indicate cue type and  reason): step pivot to BSC from EOB then again to bed and bed to recliner Toileting- Clothing Manipulation and Hygiene: Maximal assistance;Sit to/from stand Toileting - Clothing Manipulation Details (indicate cue type and reason): after cont BM on BSC     Functional mobility during ADLs: Rolling walker (2 wheels);Minimal assistance;Contact guard assist       Extremity/Trunk Assessment              Vision       Perception     Praxis     Communication Communication Communication: No apparent difficulties   Cognition Arousal: Alert Behavior During Therapy: Anxious                                 Following commands: Impaired Following commands impaired: Only follows one step commands consistently      Cueing      Exercises      Shoulder Instructions       General Comments HR and sp02 stable; drop to 86% with transfers on 3L, but recovers quickly with rest and PLB to >90%    Pertinent Vitals/ Pain       Pain Assessment Pain Assessment: Faces Faces Pain Scale: Hurts little more Pain Location: R hip Pain Descriptors / Indicators: Discomfort, Aching Pain Intervention(s): Monitored during session, Limited activity within patient's tolerance, Repositioned (asking nurse to apply new lidocaine  patch)  Home Living                                          Prior Functioning/Environment              Frequency  Min 2X/week        Progress Toward Goals  OT Goals(current goals can now be found in the care plan section)  Progress towards OT goals: Progressing toward goals  Acute Rehab OT Goals Patient Stated Goal: improve pain and function OT Goal Formulation: With patient Time For Goal Achievement: 06/27/24 Potential to Achieve Goals: Good  Plan      Co-evaluation                 AM-PAC OT 6 Clicks Daily Activity     Outcome Measure   Help from another person eating meals?: None Help from another person taking care of personal grooming?: None Help from another person toileting, which includes using toliet, bedpan, or urinal?: A Lot Help from another person bathing (including washing, rinsing, drying)?: A Lot Help from another person to put on and taking off regular upper body clothing?: A Little Help from another person to put on and taking off regular lower body  clothing?: A Lot 6 Click Score: 17    End of Session Equipment Utilized During Treatment: Rolling walker (2 wheels);Oxygen  OT Visit Diagnosis: Other abnormalities of gait and mobility (R26.89);Muscle weakness (generalized) (M62.81)   Activity Tolerance Patient limited by pain;Patient tolerated treatment well   Patient Left in chair;with call bell/phone within reach;with family/visitor present;with chair alarm set   Nurse Communication Mobility status        Time: 9046-8965 OT Time Calculation (min): 41 min  Charges: OT General Charges $OT Visit: 1 Visit OT Treatments $Self Care/Home Management : 38-52 mins  Joss Friedel, OTR/L  06/17/24, 12:45 PM   Jelisha Weed E Hershell Brandl 06/17/2024, 12:42 PM

## 2024-06-17 NOTE — Plan of Care (Signed)
   Problem: Education: Goal: Knowledge of General Education information will improve Description: Including pain rating scale, medication(s)/side effects and non-pharmacologic comfort measures Outcome: Progressing   Problem: Clinical Measurements: Goal: Will remain free from infection Outcome: Progressing

## 2024-06-17 NOTE — Plan of Care (Signed)
  Problem: Education: Goal: Knowledge of General Education information will improve Description: Including pain rating scale, medication(s)/side effects and non-pharmacologic comfort measures Outcome: Progressing   Problem: Clinical Measurements: Goal: Diagnostic test results will improve Outcome: Progressing Goal: Respiratory complications will improve Outcome: Progressing   Problem: Activity: Goal: Risk for activity intolerance will decrease Outcome: Progressing   Problem: Nutrition: Goal: Adequate nutrition will be maintained Outcome: Progressing   

## 2024-06-17 NOTE — Progress Notes (Signed)
 Physical Therapy Treatment Patient Details Name: Julie Mcbride MRN: [id number] DOB: 30-Oct-1936 Today's Date: 06/17/2024   History of Present Illness Pt is an 88 year old female admitted on 06/11/2024 with DVT/PE and underwent mechanical thrombectomy.    PMH significant for recently diagnosed stage IV lung cancer, followed by Dr. Jacobo, osteoarthritis, diverticulitis, GERD, essential hypertension, Alzheimer's dementia and MI    PT Comments  Pt was pleasant and motivated to participate during the session and put forth good effort throughout. Pt was able to ambulate with CGA with RW and remained steady throughout. Pt was able to demonstrate proper hand placement before initiating STS from chair. Pt required a rest break in between ambulation bouts, due to fatigue. After 2nd bout of ambulation, pt's SpO2% decreased to 87% on 3L/min, which rebounded to >88% after 1 minute of PLB. Otherwise, pt reported no adverse symptoms during the session other than R hip pain with SpO2 and HR WNL throughout on 3L/min. Pt' SpO2% was >90% at conclusion of PT session. Pt will benefit from continued PT services upon discharge to safely address deficits listed in patient problem list for decreased caregiver assistance and eventual return to PLOF.      If plan is discharge home, recommend the following: A little help with walking and/or transfers;A little help with bathing/dressing/bathroom;Assistance with cooking/housework;Assist for transportation;Help with stairs or ramp for entrance   Can travel by private vehicle     Yes  Equipment Recommendations   (TBD)    Recommendations for Other Services       Precautions / Restrictions Precautions Precautions: Fall Recall of Precautions/Restrictions: Impaired Restrictions Weight Bearing Restrictions Per Provider Order: No     Mobility  Bed Mobility               General bed mobility comments: not assessed; pt in chair pre/post session     Transfers Overall transfer level: Needs assistance Equipment used: Rolling walker (2 wheels) Transfers: Sit to/from Stand Sit to Stand: Min assist, Mod assist           General transfer comment: Pt required minA-modA when completing STS from chair with RW to prevent posterior LOB    Ambulation/Gait Ambulation/Gait assistance: Contact guard assist Gait Distance (Feet): 8 Feet x 1, 5 feet x 1 Assistive device: Rolling walker (2 wheels) Gait Pattern/deviations: Step-through pattern, Decreased step length - right, Decreased step length - left, Trunk flexed Gait velocity: decreased     General Gait Details: Pt demonstrated flexed trunk over RW during ambulation, as well as decreased step length bilaterally, but remained steady throughout and no LOBs occured.   Stairs             Wheelchair Mobility     Tilt Bed    Modified Rankin (Stroke Patients Only)       Balance Overall balance assessment: Needs assistance Sitting-balance support: Feet supported, No upper extremity supported Sitting balance-Leahy Scale: Good     Standing balance support: During functional activity, Bilateral upper extremity supported, Reliant on assistive device for balance Standing balance-Leahy Scale: Fair Standing balance comment: Pt reliant on RW to maintain balance in stance and ambulation, but remained steady throughout and no LOBs occured                            Communication Communication Communication: No apparent difficulties  Cognition Arousal: Alert Behavior During Therapy: WFL for tasks assessed/performed   PT - Cognitive impairments: No apparent  impairments                         Following commands: Impaired Following commands impaired: Only follows one step commands consistently    Cueing Cueing Techniques: Verbal cues, Tactile cues, Visual cues, Gestural cues  Exercises Total Joint Exercises Long Arc Quad: AROM, Right, Left, Both, 10  reps    General Comments General comments (skin integrity, edema, etc.): HR and sp02 stable; drop to 86% with transfers on 3L, but recovers quickly with rest and PLB to >90%      Pertinent Vitals/Pain Pain Assessment Pain Assessment: 0-10 Pain Score: 5  Pain Location: R hip' 0/10 at rest Pain Descriptors / Indicators: Discomfort, Aching Pain Intervention(s): Monitored during session, Limited activity within patient's tolerance    Home Living                          Prior Function            PT Goals (current goals can now be found in the care plan section) Progress towards PT goals: Progressing toward goals    Frequency    Min 2X/week      PT Plan      Co-evaluation              AM-PAC PT 6 Clicks Mobility   Outcome Measure  Help needed turning from your back to your side while in a flat bed without using bedrails?: A Lot Help needed moving from lying on your back to sitting on the side of a flat bed without using bedrails?: A Lot Help needed moving to and from a bed to a chair (including a wheelchair)?: A Little Help needed standing up from a chair using your arms (e.g., wheelchair or bedside chair)?: A Little Help needed to walk in hospital room?: A Little Help needed climbing 3-5 steps with a railing? : A Lot 6 Click Score: 15    End of Session Equipment Utilized During Treatment: Gait belt;Oxygen Activity Tolerance: Patient tolerated treatment well Patient left: in chair;with call bell/phone within reach;with family/visitor present;with chair alarm set Nurse Communication: Mobility status PT Visit Diagnosis: Muscle weakness (generalized) (M62.81);Pain;Other abnormalities of gait and mobility (R26.89);Difficulty in walking, not elsewhere classified (R26.2);Unsteadiness on feet (R26.81) Pain - Right/Left: Right Pain - part of body: Hip     Time: 8590-8551 PT Time Calculation (min) (ACUTE ONLY): 39 min  Charges:                            Leontine Ingles, SPT 06/17/24, 4:37 PM

## 2024-06-17 NOTE — Progress Notes (Signed)
 PROGRESS NOTE    Julie Mcbride  FMW:[id number] DOB: [birth year] DOA: 06/10/2024 PCP: Alla Amis, MD  Outpatient Specialists: oncology, cardiology, pulmonology    Brief Narrative:   From admission h and p  Julie Mcbride is a 88 y.o. Caucasian female with medical history significant for recently diagnosed stage IV lung cancer, followed by Dr. Jacobo, osteoarthritis, diverticulitis, GERD, essential hypertension, Alzheimer's dementia and MI who presented to the emergency room with acute onset of shortness of breath after going to the bathroom with associated left sided chest pain worsening with deep breathing as well as occasional wheezing.  She denied any nausea or vomiting or abdominal pain.  No fever or chills.  No leg pain or edema or recent travels or surgeries.    Assessment & Plan:   Principal Problem:   Acute saddle pulmonary embolism (HCC) Active Problems:   Dyslipidemia   Essential hypertension   Ischemic cardiomyopathy   CKD stage 3a, GFR 45-59 ml/min (HCC)   Renal artery stenosis (HCC)   Adenocarcinoma, lung, unspecified laterality (HCC)   Class 2 obesity   Restless leg syndrome   CAD (coronary artery disease)   Dementia (HCC)   Palliative care encounter   Pulmonary embolus (HCC)  # Bilateral PE # Right femoral DVT Provoked by malignancy. With RV dysfunction, requiring O2. POD3 from mechanical thrombectomy and catheter placement. Improving - heparin  has been transitioned to apixaban   # Acute hypoxic respiratory failure 2/2 PE. On 4 liters initially, dyspnea improving, now weaned to 3 -  O2, wean as able   # Cough Likely 2/2 malignancy and PE. No fever or white count. Cxr with multifocal opacities, think this is likely residual PE. Respiratory panel neg. Procal neg. Improving  # Ischemic cardiomyopathy # Right-sided heart failure Has history ischemic cardiomyopathy. EF normal on TTE here. TTE does show mod pHTN and mod RV systolic dysfunction,  2/2 PE. Discussed with cardiology, lasix  ok and can be helpful, just go slow as patient is pre-load dependent. I/Os haven't been accurately recorded. Treated with IV furosemide  for several days - cont home bisoprolol  - home lasix  resuming today  # Chronic low back pain Right lower back, says this is a chronic problem present for years, some days better than others. CT of lumbar and thoracic spine last month showed mild diffuse degenerative disease throughout the lumbar and thoracic spine. No mets seen. Stable - continue pain control  # Stage 4 lung cancer Recently established with dr. Jacobo. Onc palliative has seen here in the hospital. - mri brain (ordered as outpt but unable to obtain as was hospitalized here) - outpt oncology f/u  # CKD 3a Kidney function is stable  # Renal artery stenosis S/p stent, recent dopplers show patency - outpt vascular f/u - home plavix  has been transitioned to aspirin   # HTN Here bp appropriate in setting of above - hold home meds  # Restless leg - home requip   # Obesity Noted  # Swallow dysfunction Relayed by patient to nurse. SLP has cleared for regular diet  # Debility - PT advising SNF and patient is agreeable, TOC consulted for SNF. Pending insurance auth.    DVT prophylaxis: apixaban  Code Status: dnr/dni Family Communication: husband updated @ bedside 7/21  Level of care: Med-Surg Status is: Inpatient Remains inpatient appropriate because: severity of illness    Consultants:  vascular  Procedures: See above  Antimicrobials:  Peri-operative    Subjective: Dyspnea and fatigue slowly improving  Objective: Vitals:   06/16/24  1604 06/16/24 2111 06/17/24 0412 06/17/24 0741  BP: 133/70 105/73 (!) 110/55 (!) 113/58  Pulse: (!) 109 82 74 73  Resp: 17 18 18 18   Temp: 97.6 F (36.4 C) 97.9 F (36.6 C) 97.9 F (36.6 C) 97.9 F (36.6 C)  TempSrc:    Oral  SpO2: 97% 97% 97% 96%  Weight:      Height:         Intake/Output Summary (Last 24 hours) at 06/17/2024 1434 Last data filed at 06/16/2024 1957 Gross per 24 hour  Intake 0 ml  Output --  Net 0 ml   Filed Weights   06/11/24 1044 06/11/24 1425  Weight: 95.7 kg 96.9 kg    Examination:  General exam: NAD Respiratory system: tachypnea improved. Speaking in complete sentences. No wheeze, lungs clear Cardiovascular system: S1 & S2 heard, RRR. Soft systolic murmur Gastrointestinal system: Abdomen is obese, soft and nontender. No organomegaly or masses felt.   Central nervous system: Alert and oriented. No focal neurological deficits. Extremities: warm, no edema Skin: no visible lesions Psychiatry: calm    Data Reviewed: I have personally reviewed following labs and imaging studies  CBC: Recent Labs  Lab 06/11/24 0424 06/12/24 0730 06/13/24 0324 06/14/24 0549 06/17/24 0507  WBC 10.7* 10.3 7.3 5.9 8.3  HGB 11.1* 9.4* 8.7* 8.8* 8.4*  HCT 33.9* 29.0* 27.5* 27.5* 26.9*  MCV 92.1 92.4 93.5 93.2 93.4  PLT 153 144* 131* 124* 183   Basic Metabolic Panel: Recent Labs  Lab 06/13/24 0324 06/14/24 0549 06/15/24 0519 06/16/24 0449 06/17/24 0507  NA 139 138 141 139 137  K 4.1 4.1 4.1 4.2 3.9  CL 106 105 105 102 98  CO2 25 27 29 26 30   GLUCOSE 123* 118* 123* 119* 117*  BUN 32* 32* 32* 31* 32*  CREATININE 1.14* 1.13* 1.15* 1.23* 1.26*  CALCIUM  8.7* 8.8* 9.0 8.4* 8.9   GFR: Estimated Creatinine Clearance: 38.3 mL/min (A) (by C-G formula based on SCr of 1.26 mg/dL (H)). Liver Function Tests: No results for input(s): AST, ALT, ALKPHOS, BILITOT, PROT, ALBUMIN in the last 168 hours. No results for input(s): LIPASE, AMYLASE in the last 168 hours. No results for input(s): AMMONIA in the last 168 hours. Coagulation Profile: Recent Labs  Lab 06/10/24 2252  INR 1.0   Cardiac Enzymes: No results for input(s): CKTOTAL, CKMB, CKMBINDEX, TROPONINI in the last 168 hours. BNP (last 3 results) No results  for input(s): PROBNP in the last 8760 hours. HbA1C: No results for input(s): HGBA1C in the last 72 hours. CBG: Recent Labs  Lab 06/11/24 1423  GLUCAP 108*   Lipid Profile: No results for input(s): CHOL, HDL, LDLCALC, TRIG, CHOLHDL, LDLDIRECT in the last 72 hours. Thyroid Function Tests: No results for input(s): TSH, T4TOTAL, FREET4, T3FREE, THYROIDAB in the last 72 hours. Anemia Panel: No results for input(s): VITAMINB12, FOLATE, FERRITIN, TIBC, IRON, RETICCTPCT in the last 72 hours. Urine analysis:    Component Value Date/Time   COLORURINE YELLOW (A) 05/12/2024 1306   APPEARANCEUR CLOUDY (A) 05/12/2024 1306   APPEARANCEUR Hazy (A) 08/18/2021 1452   LABSPEC 1.015 05/12/2024 1306   LABSPEC 1.021 09/08/2013 0127   PHURINE 5.0 05/12/2024 1306   GLUCOSEU NEGATIVE 05/12/2024 1306   GLUCOSEU Negative 09/08/2013 0127   HGBUR NEGATIVE 05/12/2024 1306   BILIRUBINUR NEGATIVE 05/12/2024 1306   BILIRUBINUR Negative 08/18/2021 1452   BILIRUBINUR Negative 09/08/2013 0127   KETONESUR NEGATIVE 05/12/2024 1306   PROTEINUR NEGATIVE 05/12/2024 1306   NITRITE POSITIVE (A) 05/12/2024  1306   LEUKOCYTESUR LARGE (A) 05/12/2024 1306   LEUKOCYTESUR Negative 09/08/2013 0127   Sepsis Labs: @LABRCNTIP (procalcitonin:4,lacticidven:4)  ) Recent Results (from the past 240 hours)  MRSA Next Gen by PCR, Nasal     Status: None   Collection Time: 06/11/24  2:49 PM   Specimen: Nasal Mucosa; Nasal Swab  Result Value Ref Range Status   MRSA by PCR Next Gen NOT DETECTED NOT DETECTED Final    Comment: (NOTE) The GeneXpert MRSA Assay (FDA approved for NASAL specimens only), is one component of a comprehensive MRSA colonization surveillance program. It is not intended to diagnose MRSA infection nor to guide or monitor treatment for MRSA infections. Test performance is not FDA approved in patients less than 41 years old. Performed at [facility], [address] [place], [place] [address]   Expectorated Sputum Assessment w Gram Stain, Rflx to Resp Cult     Status: None   Collection Time: 06/14/24 12:41 PM   Specimen: Sputum  Result Value Ref Range Status   Specimen Description SPUTUM  Final   Special Requests NONE  Final   Sputum evaluation   Final    Sputum specimen not acceptable for testing.  Please recollect.   RESULT CALLED TO, READ BACK BY AND VERIFIED WITH: RONAL BARTHEL 06/14/24 1628 KLW Performed at [facility], [address] [place], [place] [address]    Report Status 06/14/2024 FINAL  Final  SARS Coronavirus 2 by RT PCR (hospital order, performed in [facility] hospital lab) *cepheid single result test* Anterior Nasal Swab     Status: None   Collection Time: 06/14/24  2:15 PM   Specimen: Anterior Nasal Swab  Result Value Ref Range Status   SARS Coronavirus 2 by RT PCR NEGATIVE NEGATIVE Final    Comment: (NOTE) SARS-CoV-2 target nucleic acids are NOT DETECTED.  The SARS-CoV-2 RNA is generally detectable in upper and lower respiratory specimens during the acute phase of infection. The lowest concentration of SARS-CoV-2 viral copies this assay can detect is 250 copies / mL. A negative result does not preclude SARS-CoV-2 infection and should not be used as the sole basis for treatment or other patient management decisions.  A negative result may occur with improper specimen collection / handling, submission of specimen other than nasopharyngeal swab, presence of viral mutation(s) within the areas targeted by this assay, and inadequate number of viral copies (<250 copies / mL). A negative result must be combined with clinical observations, patient history, and epidemiological information.  Fact Sheet for Patients:   RoadLapTop.co.za  Fact Sheet for Healthcare Providers: http://kim-miller.com/  This test is not yet approved or  cleared by the [place]  FDA and has  been authorized for detection and/or diagnosis of SARS-CoV-2 by FDA under an Emergency Use Authorization (EUA).  This EUA will remain in effect (meaning this test can be used) for the duration of the COVID-19 declaration under Section 564(b)(1) of the Act, 21 U.S.C. section 360bbb-3(b)(1), unless the authorization is terminated or revoked sooner.  Performed at [facility], [address] [place], [place] [address]   Respiratory (~20 pathogens) panel by PCR     Status: None   Collection Time: 06/14/24  2:15 PM   Specimen: Nasopharyngeal Swab; Respiratory  Result Value Ref Range Status   Adenovirus NOT DETECTED NOT DETECTED Final   Coronavirus 229E NOT DETECTED NOT DETECTED Final    Comment: (NOTE) The Coronavirus on the Respiratory Panel, DOES NOT test for the novel  Coronavirus (2019 nCoV)    Coronavirus HKU1 NOT DETECTED NOT DETECTED Final   Coronavirus NL63 NOT DETECTED NOT DETECTED Final   Coronavirus OC43 NOT DETECTED NOT DETECTED Final   Metapneumovirus NOT DETECTED NOT DETECTED Final   Rhinovirus / Enterovirus NOT DETECTED NOT DETECTED Final   Influenza A NOT DETECTED NOT DETECTED Final   Influenza B NOT DETECTED NOT DETECTED Final   Parainfluenza Virus 1 NOT DETECTED NOT DETECTED Final   Parainfluenza Virus 2 NOT DETECTED NOT DETECTED Final   Parainfluenza Virus 3 NOT DETECTED NOT DETECTED Final   Parainfluenza Virus 4 NOT DETECTED NOT DETECTED Final   Respiratory Syncytial Virus NOT DETECTED NOT DETECTED Final   Bordetella pertussis NOT DETECTED NOT DETECTED Final   Bordetella Parapertussis NOT DETECTED NOT DETECTED Final   Chlamydophila pneumoniae NOT DETECTED NOT DETECTED Final   Mycoplasma pneumoniae NOT DETECTED NOT DETECTED Final    Comment: Performed at [facility], [address] [place], [place] [address]         Radiology Studies: MR BRAIN W WO CONTRAST Result Date: 06/16/2024 EXAM: MRI BRAIN WITH AND WITHOUT CONTRAST 06/16/2024  06:30:00 PM TECHNIQUE: Multiplanar multisequence MRI of the head/brain was performed with and without the administration of intravenous contrast. COMPARISON: 06/07/2023 CLINICAL HISTORY: Cancer staging. Lung CA. Eval for mets. FINDINGS: BRAIN AND VENTRICLES: No acute infarct. No acute intracranial hemorrhage. No mass effect or midline shift. No hydrocephalus. The sella is unremarkable. Normal flow voids. No mass or abnormal enhancement. Multifocal hyperintense T2-weighted signal within the cerebral white matter, most commonly due to chronic small vessel disease. Generalized volume loss. 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 ORBITS: No acute abnormality. SINUSES: No acute abnormality. BONES AND SOFT TISSUES: Normal bone marrow signal and enhancement. No acute soft tissue abnormality. IMPRESSION: 1. No acute intracranial abnormality. 2. Multifocal hyperintense T2-weighted signal within the cerebral white matter, most commonly due to chronic small vessel disease. 3. Generalized volume loss. 4. 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 Electronically signed by: Franky Stanford MD 06/16/2024 08:35 PM EDT RP Workstation: [id number]          Scheduled Meds:  apixaban   5 mg Oral BID   aspirin  EC  81 mg Oral Daily   atorvastatin   10 mg Oral Daily   bisoprolol   10 mg Oral Daily   cholecalciferol   4,000 Units Oral Daily   feeding supplement  237 mL Oral BID BM   hydrALAZINE   100 mg Oral BID   hydrALAZINE   50 mg Oral QHS   lidocaine   1 patch Transdermal Q24H   loratadine   10 mg Oral Daily   multivitamin with minerals  1 tablet Oral Daily   polyethylene glycol  34 g Oral Daily   rOPINIRole   0.5 mg Oral TID   senna  1 tablet Oral Daily   sodium chloride  flush  3 mL Intravenous Once   Continuous Infusions:     LOS: 6 days   Devaughn KATHEE Ban, MD [facility]   If 7PM-7AM, please contact night-coverage [email or web address] Password [email or web address] 06/17/2024, 2:34 PM

## 2024-06-18 ENCOUNTER — Encounter: Payer: Self-pay | Admitting: Oncology

## 2024-06-18 DIAGNOSIS — I2602 Saddle embolus of pulmonary artery with acute cor pulmonale: Secondary | ICD-10-CM | POA: Diagnosis not present

## 2024-06-18 MED ORDER — OXYCODONE-ACETAMINOPHEN 5-325 MG PO TABS
1.0000 | ORAL_TABLET | Freq: Four times a day (QID) | ORAL | Status: DC | PRN
  Administered 2024-06-18: 1 via ORAL
  Filled 2024-06-18: qty 1

## 2024-06-18 NOTE — Progress Notes (Signed)
 Physical Therapy Treatment Patient Details Name: Julie Mcbride MRN: [id number] DOB: [birth year] Today's Date: 06/18/2024   History of Present Illness Pt is an 88 year old female admitted on 06/11/2024 with DVT/PE and underwent mechanical thrombectomy.    PMH significant for recently diagnosed stage IV lung cancer, followed by Dr. Jacobo, osteoarthritis, diverticulitis, GERD, essential hypertension, Alzheimer's dementia and MI    PT Comments  Pt was long sitting in bed upon arrival. She endorse fatigue but was agreeable to get OOB. Remains cooperative throughout. Author attempted to wean pt to rm air however pt quickly desaturates to 86% without even attempting OOB. Reapplied O2 with sao2 recovering to > 90%. Pt's fatigue/SOB/pain is most limiting throughout. Prolonged recovery with minimal activity. Pt was able to ambulate ~ 8 ft prior to needed seated rest. Discussed at length with pt/pt's spouse/and pt's son all their concerns and recommendations for post acute care. Acute PT will continue to follow and progress as able per current POC.    If plan is discharge home, recommend the following: A little help with walking and/or transfers;A little help with bathing/dressing/bathroom;Assistance with cooking/housework;Assist for transportation;Help with stairs or ramp for entrance     Equipment Recommendations  Other (comment) (Defer to next level of care)       Precautions / Restrictions Precautions Precautions: Fall Recall of Precautions/Restrictions: Impaired Restrictions Weight Bearing Restrictions Per Provider Order: No     Mobility  Bed Mobility Overal bed mobility: Needs Assistance Bed Mobility: Supine to Sit  Supine to sit: HOB elevated, Used rails, Min assist   Transfers Overall transfer level: Needs assistance Equipment used: Rolling walker (2 wheels) Transfers: Sit to/from Stand Sit to Stand: Min assist, Mod assist  General transfer comment: Pt was able to stand form  slightly elevated bed height with min assist of one. does have poor activity tolerance with slight SOB with minimal activity    Ambulation/Gait Ambulation/Gait assistance: Contact guard assist Gait Distance (Feet): 8 Feet Assistive device: Rolling walker (2 wheels) Gait Pattern/deviations: Step-through pattern Gait velocity: decreased  General Gait Details: distance limited by pain and SOB    Balance Overall balance assessment: Needs assistance Sitting-balance support: Feet supported, No upper extremity supported Sitting balance-Leahy Scale: Good     Standing balance support: During functional activity, Bilateral upper extremity supported, Reliant on assistive device for balance Standing balance-Leahy Scale: Fair      Hotel manager: No apparent difficulties  Cognition Arousal: Alert Behavior During Therapy: WFL for tasks assessed/performed   PT - Cognitive impairments: No apparent impairments      PT - Cognition Comments: Pt is A and O x 3. Per son, she hs been very lehargic in mornins. bed time medications adjusted today Following commands: Impaired Following commands impaired: Only follows one step commands consistently    Cueing Cueing Techniques: Verbal cues, Tactile cues, Visual cues, Gestural cues     General Comments General comments (skin integrity, edema, etc.): lengthy discussion with pt/pt's son about RN concerns and medications.      Pertinent Vitals/Pain Pain Assessment Pain Assessment: 0-10 Pain Score: 4  Pain Location: R hip' 0/10 at rest Pain Descriptors / Indicators: Discomfort, Aching Pain Intervention(s): Limited activity within patient's tolerance, Monitored during session, Premedicated before session, Repositioned     PT Goals (current goals can now be found in the care plan section) Acute Rehab PT Goals Patient Stated Goal: decreased pain, get better Progress towards PT goals: Progressing toward goals     Frequency  Min 2X/week       Co-evaluation     PT goals addressed during session: Mobility/safety with mobility;Balance;Proper use of DME;Strengthening/ROM        AM-PAC PT 6 Clicks Mobility   Outcome Measure  Help needed turning from your back to your side while in a flat bed without using bedrails?: A Little Help needed moving from lying on your back to sitting on the side of a flat bed without using bedrails?: A Little Help needed moving to and from a bed to a chair (including a wheelchair)?: A Little Help needed standing up from a chair using your arms (e.g., wheelchair or bedside chair)?: A Little Help needed to walk in hospital room?: A Little Help needed climbing 3-5 steps with a railing? : A Lot 6 Click Score: 17    End of Session Equipment Utilized During Treatment: Oxygen Activity Tolerance: Patient tolerated treatment well;Patient limited by fatigue Patient left: in chair;with call bell/phone within reach;with family/visitor present;with chair alarm set Nurse Communication: Mobility status PT Visit Diagnosis: Muscle weakness (generalized) (M62.81);Pain;Other abnormalities of gait and mobility (R26.89);Difficulty in walking, not elsewhere classified (R26.2);Unsteadiness on feet (R26.81) Pain - Right/Left: Right Pain - part of body: Hip     Time: 8379-8351 PT Time Calculation (min) (ACUTE ONLY): 28 min  Charges:    $Gait Training: 8-22 mins $Therapeutic Activity: 8-22 mins PT General Charges $$ ACUTE PT VISIT: 1 Visit                     Rankin Essex PTA 06/18/24, 5:16 PM

## 2024-06-18 NOTE — TOC Progression Note (Signed)
 Transition of Care [facility]) - Progression Note    Patient Details  Name: Julie Mcbride MRN: [id number] Date of Birth: Aug 12, 1936  Transition of Care [facility]) CM/SW Contact  Marinda Cooks, RN Phone Number: 06/18/2024, 1:54 PM  Clinical Narrative:    This CM called to confirm bed offer with [facility] informed that ins auth needs to be initiated . This CM called and [employer] [facility] @ [phone] and initiated auth .This CM caled and updated pt's son . TOC will cont to follow and update as applicable.         Expected Discharge Plan and Services                                               Social Determinants of Health (SDOH) Interventions SDOH Screenings   Food Insecurity: No Food Insecurity (06/13/2024)  Housing: Low Risk  (06/13/2024)  Transportation Needs: No Transportation Needs (06/13/2024)  Utilities: Not At Risk (06/13/2024)  Depression (PHQ2-9): Low Risk  (05/30/2024)  Financial Resource Strain: Low Risk  (05/15/2024)   Received from [facility]  Social Connections: Socially Integrated (06/13/2024)  Tobacco Use: Low Risk  (06/11/2024)    Readmission Risk Interventions     No data to display

## 2024-06-18 NOTE — Progress Notes (Signed)
 Daughter [name] called this nurse (floor charge nurse) and said she wanted to file a formal complaint about patients care. She stated patient was not getting needs met and she had spoken to someone about this over the weekend. She states her mother called out at [phone] and nurse came in at [phone] but stated no one had told her patient needed assistance. Daughter upset that patient apparently sat in chair 5 or 6 hours over the weekend and had called several times to be assisted back to bed. Daughter stated that patient has multiple complaints of calling out and the nurse or NT saying no one ever relayed the call. This nurse assured daughter that situation would be discussed with the staff and that her concerns would be escalated to management.This nurse also instructed daughter to use the numbers on the white boards to directly call nurse or NT if needs are not being met in a timely manner, daughter stated patient should not have to call the numbers, just push the call bell. This nurse  notified all care team members; nurse, NT, HUC, manager and assistant manage of floor.

## 2024-06-18 NOTE — Progress Notes (Signed)
 PROGRESS NOTE    Julie Mcbride  FMW:[id number] DOB: 1936/05/21 DOA: 06/10/2024 PCP: Alla Amis, MD  [id number]  LOS: 7 days   Brief hospital course:   Assessment & Plan: Julie Mcbride is a 88 y.o. Caucasian female with medical history significant for recently diagnosed stage IV lung cancer, followed by Dr. Jacobo, osteoarthritis, diverticulitis, essential hypertension, Alzheimer's dementia and MI who presented to the emergency room with acute onset of shortness of breath.   # Bilateral PE # Right femoral DVT S/p thrombectomy Provoked by malignancy. With RV dysfunction, requiring O2.  --cont Eliquis    # Acute hypoxic respiratory failure 2/2 PE.  On 4 liters initially, dyspnea improving, now weaned to 3 --Continue supplemental O2 to keep sats >=90%, wean as tolerated  # Cough Likely 2/2 malignancy and PE. No fever or white count. Cxr with multifocal opacities, think this is likely residual PE. Respiratory panel neg. Procal neg. Improving   # Ischemic cardiomyopathy # Right-sided heart failure Has history ischemic cardiomyopathy. EF normal on TTE here. TTE does show mod pHTN and mod RV systolic dysfunction, 2/2 PE. Discussed with cardiology, lasix  ok and can be helpful, just go slow as patient is pre-load dependent. I/Os haven't been accurately recorded. Treated with IV furosemide  for several days - cont home bisoprolol  --cont lasix  20 mg daily   # Chronic low back pain on chronic opioids Right lower back, says this is a chronic problem present for years, some days better than others. CT of lumbar and thoracic spine last month showed mild diffuse degenerative disease throughout the lumbar and thoracic spine. No mets seen. Stable --family reported pt was given more opioids than what pt took at home. --Percocet 1 tab TID PRN --cont robaxin  PRN   # Stage 4 lung cancer Recently established with dr. Jacobo. Onc palliative has seen here in the hospital. --MRI  brain No acute intracranial abnormality.  - outpt oncology f/u   # CKD 3a Kidney function is stable   # Renal artery stenosis S/p stent, recent dopplers show patency - outpt vascular f/u - home plavix  has been transitioned to aspirin    # HTN --cont bisoprolol , lasix  and hydralazine    # Restless leg - home requip    # Obesity Noted   # Swallow dysfunction Relayed by patient to nurse. SLP has cleared for regular diet   # Debility - PT advising SNF and patient is agreeable, TOC consulted for SNF. Pending insurance auth.   DVT prophylaxis: On:Eliquis  Code Status: DNR  Family Communication: son updated at bedside and daughter updated on the phone today Level of care: Med-Surg Dispo:   The patient is from: home Anticipated d/c is to: SNF rehab Anticipated d/c date is: whenever SNF can accept   Subjective and Interval History:  Pt denied dyspnea.  Family reported pt was sleepy and out of it due to being given too much opioids.   Objective: Vitals:   06/17/24 2111 06/18/24 0313 06/18/24 0742 06/18/24 1608  BP: (!) 114/53 119/66 130/68 (!) 132/51  Pulse: 72 64 70 70  Resp: 18 18 17 18   Temp: 97.8 F (36.6 C) (!) 97.4 F (36.3 C) 98.8 F (37.1 C) 98.1 F (36.7 C)  TempSrc: Oral Oral Oral Oral  SpO2: 96% 98% 96% 98%  Weight:      Height:        Intake/Output Summary (Last 24 hours) at 06/18/2024 1951 Last data filed at 06/18/2024 0900 Gross per 24 hour  Intake 480 ml  Output --  Net 480 ml   Filed Weights   06/11/24 1044 06/11/24 1425  Weight: 95.7 kg 96.9 kg    Examination:   Constitutional: NAD, sleepy but arousable, oriented HEENT: conjunctivae and lids normal, EOMI CV: No cyanosis.   RESP: normal respiratory effort, on 3L   Data Reviewed: I have personally reviewed labs and imaging studies  Time spent: 50 minutes  Ellouise Haber, MD [facility] If 7PM-7AM, please contact night-coverage 06/18/2024, 7:51 PM

## 2024-06-19 DIAGNOSIS — C801 Malignant (primary) neoplasm, unspecified: Secondary | ICD-10-CM | POA: Diagnosis not present

## 2024-06-19 DIAGNOSIS — Z9889 Other specified postprocedural states: Secondary | ICD-10-CM | POA: Diagnosis not present

## 2024-06-19 DIAGNOSIS — J188 Other pneumonia, unspecified organism: Secondary | ICD-10-CM | POA: Diagnosis not present

## 2024-06-19 DIAGNOSIS — R457 State of emotional shock and stress, unspecified: Secondary | ICD-10-CM | POA: Diagnosis not present

## 2024-06-19 DIAGNOSIS — I509 Heart failure, unspecified: Secondary | ICD-10-CM | POA: Diagnosis not present

## 2024-06-19 DIAGNOSIS — J309 Allergic rhinitis, unspecified: Secondary | ICD-10-CM | POA: Diagnosis not present

## 2024-06-19 DIAGNOSIS — Z79818 Long term (current) use of other agents affecting estrogen receptors and estrogen levels: Secondary | ICD-10-CM | POA: Diagnosis not present

## 2024-06-19 DIAGNOSIS — Z66 Do not resuscitate: Secondary | ICD-10-CM | POA: Diagnosis present

## 2024-06-19 DIAGNOSIS — J90 Pleural effusion, not elsewhere classified: Secondary | ICD-10-CM | POA: Diagnosis present

## 2024-06-19 DIAGNOSIS — J9811 Atelectasis: Secondary | ICD-10-CM | POA: Diagnosis not present

## 2024-06-19 DIAGNOSIS — R2689 Other abnormalities of gait and mobility: Secondary | ICD-10-CM | POA: Diagnosis not present

## 2024-06-19 DIAGNOSIS — N1831 Chronic kidney disease, stage 3a: Secondary | ICD-10-CM | POA: Diagnosis not present

## 2024-06-19 DIAGNOSIS — I82411 Acute embolism and thrombosis of right femoral vein: Secondary | ICD-10-CM | POA: Diagnosis not present

## 2024-06-19 DIAGNOSIS — I2609 Other pulmonary embolism with acute cor pulmonale: Secondary | ICD-10-CM | POA: Diagnosis not present

## 2024-06-19 DIAGNOSIS — C7800 Secondary malignant neoplasm of unspecified lung: Secondary | ICD-10-CM | POA: Diagnosis not present

## 2024-06-19 DIAGNOSIS — I130 Hypertensive heart and chronic kidney disease with heart failure and stage 1 through stage 4 chronic kidney disease, or unspecified chronic kidney disease: Secondary | ICD-10-CM | POA: Diagnosis not present

## 2024-06-19 DIAGNOSIS — R0902 Hypoxemia: Secondary | ICD-10-CM | POA: Diagnosis not present

## 2024-06-19 DIAGNOSIS — Z741 Need for assistance with personal care: Secondary | ICD-10-CM | POA: Diagnosis not present

## 2024-06-19 DIAGNOSIS — I2489 Other forms of acute ischemic heart disease: Secondary | ICD-10-CM | POA: Diagnosis present

## 2024-06-19 DIAGNOSIS — I10 Essential (primary) hypertension: Secondary | ICD-10-CM | POA: Diagnosis not present

## 2024-06-19 DIAGNOSIS — N183 Chronic kidney disease, stage 3 unspecified: Secondary | ICD-10-CM | POA: Diagnosis not present

## 2024-06-19 DIAGNOSIS — Z1152 Encounter for screening for COVID-19: Secondary | ICD-10-CM | POA: Diagnosis not present

## 2024-06-19 DIAGNOSIS — I255 Ischemic cardiomyopathy: Secondary | ICD-10-CM | POA: Diagnosis not present

## 2024-06-19 DIAGNOSIS — R0982 Postnasal drip: Secondary | ICD-10-CM | POA: Diagnosis not present

## 2024-06-19 DIAGNOSIS — C787 Secondary malignant neoplasm of liver and intrahepatic bile duct: Secondary | ICD-10-CM | POA: Diagnosis present

## 2024-06-19 DIAGNOSIS — R41841 Cognitive communication deficit: Secondary | ICD-10-CM | POA: Diagnosis not present

## 2024-06-19 DIAGNOSIS — C7802 Secondary malignant neoplasm of left lung: Secondary | ICD-10-CM | POA: Diagnosis not present

## 2024-06-19 DIAGNOSIS — I2602 Saddle embolus of pulmonary artery with acute cor pulmonale: Secondary | ICD-10-CM | POA: Diagnosis not present

## 2024-06-19 DIAGNOSIS — C3490 Malignant neoplasm of unspecified part of unspecified bronchus or lung: Secondary | ICD-10-CM | POA: Diagnosis present

## 2024-06-19 DIAGNOSIS — R069 Unspecified abnormalities of breathing: Secondary | ICD-10-CM | POA: Diagnosis not present

## 2024-06-19 DIAGNOSIS — Z515 Encounter for palliative care: Secondary | ICD-10-CM | POA: Diagnosis not present

## 2024-06-19 DIAGNOSIS — A419 Sepsis, unspecified organism: Secondary | ICD-10-CM | POA: Diagnosis present

## 2024-06-19 DIAGNOSIS — I959 Hypotension, unspecified: Secondary | ICD-10-CM | POA: Diagnosis not present

## 2024-06-19 DIAGNOSIS — M545 Low back pain, unspecified: Secondary | ICD-10-CM | POA: Diagnosis not present

## 2024-06-19 DIAGNOSIS — R918 Other nonspecific abnormal finding of lung field: Secondary | ICD-10-CM | POA: Diagnosis not present

## 2024-06-19 DIAGNOSIS — Z743 Need for continuous supervision: Secondary | ICD-10-CM | POA: Diagnosis not present

## 2024-06-19 DIAGNOSIS — E785 Hyperlipidemia, unspecified: Secondary | ICD-10-CM | POA: Diagnosis present

## 2024-06-19 DIAGNOSIS — I2510 Atherosclerotic heart disease of native coronary artery without angina pectoris: Secondary | ICD-10-CM | POA: Diagnosis present

## 2024-06-19 DIAGNOSIS — C7951 Secondary malignant neoplasm of bone: Secondary | ICD-10-CM | POA: Diagnosis present

## 2024-06-19 DIAGNOSIS — C7801 Secondary malignant neoplasm of right lung: Secondary | ICD-10-CM | POA: Diagnosis not present

## 2024-06-19 DIAGNOSIS — G2581 Restless legs syndrome: Secondary | ICD-10-CM | POA: Diagnosis present

## 2024-06-19 DIAGNOSIS — Z48813 Encounter for surgical aftercare following surgery on the respiratory system: Secondary | ICD-10-CM | POA: Diagnosis not present

## 2024-06-19 DIAGNOSIS — M62838 Other muscle spasm: Secondary | ICD-10-CM | POA: Diagnosis not present

## 2024-06-19 DIAGNOSIS — M6281 Muscle weakness (generalized): Secondary | ICD-10-CM | POA: Diagnosis not present

## 2024-06-19 DIAGNOSIS — R1311 Dysphagia, oral phase: Secondary | ICD-10-CM | POA: Diagnosis not present

## 2024-06-19 DIAGNOSIS — J189 Pneumonia, unspecified organism: Secondary | ICD-10-CM | POA: Diagnosis present

## 2024-06-19 DIAGNOSIS — R059 Cough, unspecified: Secondary | ICD-10-CM | POA: Diagnosis not present

## 2024-06-19 DIAGNOSIS — I129 Hypertensive chronic kidney disease with stage 1 through stage 4 chronic kidney disease, or unspecified chronic kidney disease: Secondary | ICD-10-CM | POA: Diagnosis present

## 2024-06-19 DIAGNOSIS — Z6834 Body mass index (BMI) 34.0-34.9, adult: Secondary | ICD-10-CM | POA: Diagnosis not present

## 2024-06-19 DIAGNOSIS — J9622 Acute and chronic respiratory failure with hypercapnia: Secondary | ICD-10-CM | POA: Diagnosis present

## 2024-06-19 DIAGNOSIS — F02A4 Dementia in other diseases classified elsewhere, mild, with anxiety: Secondary | ICD-10-CM | POA: Diagnosis present

## 2024-06-19 DIAGNOSIS — R6520 Severe sepsis without septic shock: Secondary | ICD-10-CM | POA: Diagnosis present

## 2024-06-19 DIAGNOSIS — M1990 Unspecified osteoarthritis, unspecified site: Secondary | ICD-10-CM | POA: Diagnosis not present

## 2024-06-19 DIAGNOSIS — I447 Left bundle-branch block, unspecified: Secondary | ICD-10-CM | POA: Diagnosis present

## 2024-06-19 DIAGNOSIS — F01A4 Vascular dementia, mild, with anxiety: Secondary | ICD-10-CM | POA: Diagnosis present

## 2024-06-19 DIAGNOSIS — R5381 Other malaise: Secondary | ICD-10-CM | POA: Diagnosis not present

## 2024-06-19 DIAGNOSIS — G309 Alzheimer's disease, unspecified: Secondary | ICD-10-CM | POA: Diagnosis present

## 2024-06-19 DIAGNOSIS — J9600 Acute respiratory failure, unspecified whether with hypoxia or hypercapnia: Secondary | ICD-10-CM | POA: Diagnosis not present

## 2024-06-19 DIAGNOSIS — J9621 Acute and chronic respiratory failure with hypoxia: Secondary | ICD-10-CM | POA: Diagnosis present

## 2024-06-19 DIAGNOSIS — F419 Anxiety disorder, unspecified: Secondary | ICD-10-CM | POA: Diagnosis not present

## 2024-06-19 DIAGNOSIS — R262 Difficulty in walking, not elsewhere classified: Secondary | ICD-10-CM | POA: Diagnosis not present

## 2024-06-19 DIAGNOSIS — R0602 Shortness of breath: Secondary | ICD-10-CM | POA: Diagnosis present

## 2024-06-19 DIAGNOSIS — J9601 Acute respiratory failure with hypoxia: Secondary | ICD-10-CM | POA: Diagnosis not present

## 2024-06-19 MED ORDER — HYDRALAZINE HCL 50 MG PO TABS: 100.0000 mg | ORAL_TABLET | Freq: Two times a day (BID) | ORAL | Status: DC

## 2024-06-19 MED ORDER — BISOPROLOL FUMARATE 10 MG PO TABS: 10.0000 mg | ORAL_TABLET | Freq: Every day | ORAL | Status: DC

## 2024-06-19 MED ORDER — ALBUTEROL SULFATE (2.5 MG/3ML) 0.083% IN NEBU: 3.0000 mL | INHALATION_SOLUTION | Freq: Four times a day (QID) | RESPIRATORY_TRACT | Status: DC | PRN

## 2024-06-19 MED ORDER — OXYCODONE-ACETAMINOPHEN 5-325 MG PO TABS: 1.0000 | ORAL_TABLET | Freq: Four times a day (QID) | ORAL | 0 refills | Status: DC | PRN

## 2024-06-19 MED ORDER — GUAIFENESIN 100 MG/5ML PO LIQD
5.0000 mL | ORAL | Status: DC | PRN
  Administered 2024-06-19: 5 mL via ORAL
  Filled 2024-06-19: qty 10

## 2024-06-19 MED ORDER — ASPIRIN 81 MG PO TBEC: 81.0000 mg | DELAYED_RELEASE_TABLET | Freq: Every day | ORAL | Status: DC

## 2024-06-19 MED ORDER — APIXABAN 5 MG PO TABS: 5.0000 mg | ORAL_TABLET | Freq: Two times a day (BID) | ORAL | Status: DC

## 2024-06-19 MED ORDER — GUAIFENESIN 100 MG/5ML PO LIQD: 5.0000 mL | ORAL | Status: DC | PRN

## 2024-06-19 MED ORDER — OXYCODONE-ACETAMINOPHEN 5-325 MG PO TABS
1.0000 | ORAL_TABLET | Freq: Four times a day (QID) | ORAL | Status: DC | PRN
  Administered 2024-06-19: 2 via ORAL
  Filled 2024-06-19: qty 2

## 2024-06-19 NOTE — Discharge Summary (Signed)
 Physician Discharge Summary   Patient: Julie Mcbride MRN: [id number] DOB: Sep 16, 1936  Admit date:     06/10/2024  Discharge date: 06/19/24  Discharge Physician: Murvin Mana   PCP: Alla Amis, MD   Recommendations at discharge:   Follow-up PCP in 1 week.  Discharge Diagnoses: Principal Problem:   Acute saddle pulmonary embolism (HCC) Active Problems:   Dyslipidemia   Essential hypertension   Ischemic cardiomyopathy   CKD stage 3a, GFR 45-59 ml/min (HCC)   Renal artery stenosis (HCC)   Adenocarcinoma, lung, unspecified laterality (HCC)   Class 2 obesity   Restless leg syndrome   CAD (coronary artery disease)   Dementia (HCC)   Palliative care encounter   Pulmonary embolus (HCC)  Resolved Problems:   * No resolved hospital problems. *  Hospital Course: Julie Mcbride is a 88 y.o. Caucasian female with medical history significant for recently diagnosed stage IV lung cancer, followed by Dr. Jacobo, osteoarthritis, diverticulitis, essential hypertension, Alzheimer's dementia and MI who presented to the emergency room with acute onset of shortness of breath.  Patient had a severe hypoxemia, initially required high flow oxygen.  CT angiogram showed extensive bilateral pulm emboli with right heart strain. Bilateral pulmonary thrombectomy was performed on 7/15.  Postoperatively, patient was treated with Eliquis . Condition has been stabilized, currently pending nursing home placement. Insurance has approved nursing placement today.  Will transfer Assessment and Plan: # Bilateral PE post thrombectomy. # Right femoral DVT Acute hypoxemic respite failure secondary to pulmonary emboli. Acute right-sided congestive heart failure secondary to acute cor pulmonale secondary to acute PE S/p thrombectomy Provoked by malignancy. With RV dysfunction, requiring O2.  Cardiogram showed moderate hypertension with moderate RV systolic dysfunction, action fraction normal. Condition  improving, currently on 2 L oxygen, will continue wean off oxygen. --cont Eliquis    # Ischemic cardiomyopathy Chronic Congestive Heart Failure. Does not have volume overload.   # Chronic low back pain on chronic opioids Right lower back, says this is a chronic problem present for years, some days better than others. CT of lumbar and thoracic spine last month showed mild diffuse degenerative disease throughout the lumbar and thoracic spine. No mets seen. Stable Continue as needed pain medicine.   # Stage 4 lung cancer Recently established with dr. Jacobo. Onc palliative has seen here in the hospital. --MRI brain No acute intracranial abnormality.  Referred by oncology after discharge.   # CKD 3a # Renal artery stenosis status post stent Renal function still stable. Aspirin .   # HTN Blood pressure well-controlled with bisoprolol  and hydralazine .   # Restless leg - home requip    #  Class I Obesity. BMI 34.89 Diet and excercise   Dysphagia Relayed by patient to nurse. SLP has cleared for regular diet   # Debility .  Nursing home placement         Consultants: Vascular surgery. Procedures performed: Pulmonary thrombectomy. Disposition: Skilled nursing facility Diet recommendation:  Discharge Diet Orders (From admission, onward)     Start     Ordered   06/19/24 0000  Diet - low sodium heart healthy        06/19/24 1523           Cardiac diet DISCHARGE MEDICATION: Allergies as of 06/19/2024       Reactions   Chlorhexidine  Hives   SAGE wipes caused severe rash   Cyclobenzaprine Hcl    Doesn't remember reaction   Etodolac    Doesn't remember reaction   Lisinopril  Coughing up blood   Meloxicam    Doesn't remember reaction   Metaxalone    Doesn't remember reaction   Neomycin-bacitracin Zn-polymyx    Skin irritation   Nifedipine Swelling   Nitrofurantoin    Doesn't remember reaction   Sulfamethoxazole-trimethoprim Hives   Sulfonamide Derivatives  Hives   Tizanidine    Liver problems   Vasotec [enalapril Maleate]    Doesn't remember reaction   Ibuprofen Rash   Tape Rash        Medication List     STOP taking these medications    bisoprolol -hydrochlorothiazide  10-6.25 MG tablet Commonly known as: ZIAC    clopidogrel  75 MG tablet Commonly known as: PLAVIX    Cranberry 500 MG Caps   losartan  100 MG tablet Commonly known as: COZAAR    multivitamin capsule       TAKE these medications    acetaminophen  500 MG tablet Commonly known as: TYLENOL  Take 1,000 mg by mouth as needed for mild pain (pain score 1-3).   albuterol  (2.5 MG/3ML) 0.083% nebulizer solution Commonly known as: PROVENTIL  Inhale 3 mLs into the lungs every 6 (six) hours as needed for wheezing or shortness of breath.   apixaban  5 MG Tabs tablet Commonly known as: ELIQUIS  Take 1 tablet (5 mg total) by mouth 2 (two) times daily.   aspirin  EC 81 MG tablet Take 1 tablet (81 mg total) by mouth daily. Swallow whole. Start taking on: June 20, 2024   atorvastatin  10 MG tablet Commonly known as: Lipitor Take 1 tablet (10 mg total) by mouth daily.   bisoprolol  10 MG tablet Commonly known as: ZEBETA  Take 1 tablet (10 mg total) by mouth daily. Start taking on: June 20, 2024   cetirizine 10 MG tablet Commonly known as: ZYRTEC Take 10 mg by mouth as needed.   estradiol  0.1 MG/GM vaginal cream Commonly known as: ESTRACE  VAGINAL Apply 0.5mg  (pea-sized amount)  just inside the vaginal introitus with a finger-tip on Monday, Wednesday and Friday nights.   fluticasone  50 MCG/ACT nasal spray Commonly known as: FLONASE  Place 2 sprays into both nostrils as needed for allergies or rhinitis.   furosemide  20 MG tablet Commonly known as: LASIX  Take 20 mg by mouth daily.   guaiFENesin  100 MG/5ML liquid Commonly known as: ROBITUSSIN Take 5 mLs by mouth every 4 (four) hours as needed for cough or to loosen phlegm.   hydrALAZINE  50 MG tablet Commonly known  as: APRESOLINE  Take 2 tablets (100 mg total) by mouth in the morning and at bedtime. TAKE 2 TABLETS BY MOUTH EVERY MORNING TAKE ONE TABLET MID-DAY AND TAKE ONE TABLET BEFORE BED What changed:  how much to take when to take this   lidocaine  4 % Place 1 patch onto the skin daily. REMOVE & DISCARD PATCH WITHIN 12 HOURS OR AS DIRECTED BY MD   methocarbamol  500 MG tablet Commonly known as: ROBAXIN  Take 1 tablet (500 mg total) by mouth every 6 (six) hours as needed for muscle spasms.   oxyCODONE -acetaminophen  5-325 MG tablet Commonly known as: PERCOCET/ROXICET Take 1-2 tablets by mouth every 6 (six) hours as needed for moderate pain (pain score 4-6) or severe pain (pain score 7-10). What changed:  how much to take when to take this reasons to take this   rOPINIRole  0.25 MG tablet Commonly known as: REQUIP  Take 0.5 mg by mouth 3 (three) times daily.   Vitamin D  50 MCG (2000 UT) tablet Take 4,000 Units by mouth daily.        Contact  information for follow-up providers     Alla Amis, MD Follow up in 1 week(s).   Specialty: Family Medicine Contact information: [address] [facility] [place] [address] [phone]              Contact information for after-discharge care     Destination     [facility] .   Service: Skilled Nursing Contact information: [address] [place]  [address] [phone]                    Discharge Exam: Filed Weights   06/11/24 1044 06/11/24 1425 06/19/24 1400  Weight: 95.7 kg 96.9 kg 98 kg   General exam: Appears calm and comfortable  Respiratory system: Clear to auscultation. Respiratory effort normal. Cardiovascular system: S1 & S2 heard, RRR. No JVD, murmurs, rubs, gallops or clicks. No pedal edema. Gastrointestinal system: Abdomen is nondistended, soft and nontender. No organomegaly or masses felt. Normal bowel sounds heard. Central nervous system:  Alert and oriented. No focal neurological deficits. Extremities: Symmetric 5 x 5 power. Skin: No rashes, lesions or ulcers Psychiatry: Judgement and insight appear normal. Mood & affect appropriate.    Condition at discharge: good  The results of significant diagnostics from this hospitalization (including imaging, microbiology, ancillary and laboratory) are listed below for reference.   Imaging Studies: MR BRAIN W WO CONTRAST Result Date: 06/16/2024 EXAM: MRI BRAIN WITH AND WITHOUT CONTRAST 06/16/2024 06:30:00 PM TECHNIQUE: Multiplanar multisequence MRI of the head/brain was performed with and without the administration of intravenous contrast. COMPARISON: 06/07/2023 CLINICAL HISTORY: Cancer staging. Lung CA. Eval for mets. FINDINGS: BRAIN AND VENTRICLES: No acute infarct. No acute intracranial hemorrhage. No mass effect or midline shift. No hydrocephalus. The sella is unremarkable. Normal flow voids. No mass or abnormal enhancement. Multifocal hyperintense T2-weighted signal within the cerebral white matter, most commonly due to chronic small vessel disease. Generalized volume loss. 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 ORBITS: No acute abnormality. SINUSES: No acute abnormality. BONES AND SOFT TISSUES: Normal bone marrow signal and enhancement. No acute soft tissue abnormality. IMPRESSION: 1. No acute intracranial abnormality. 2. Multifocal hyperintense T2-weighted signal within the cerebral white matter, most commonly due to chronic small vessel disease. 3. Generalized volume loss. 4. 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 Electronically signed by: Franky Stanford MD 06/16/2024 08:35 PM EDT RP Workstation: [id number]   DG Chest Port 1 View Result Date: 06/14/2024 CLINICAL DATA:  Cough and breathing difficulty EXAM: PORTABLE CHEST 1 VIEW COMPARISON:  Chest radiograph dated 06/10/2024 FINDINGS: Low lung volumes with bronchovascular crowding. Increased patchy bibasilar  opacities and mild interstitial opacities. Similar trace bilateral pleural effusions. No pneumothorax. Similar mildly enlarged cardiomediastinal silhouette. Cervical spinal fixation hardware appears intact. IMPRESSION: 1. Increased patchy bibasilar opacities and mild interstitial opacities, which may represent pulmonary edema or multifocal infection. 2. Similar trace bilateral pleural effusions. Electronically Signed   By: Limin  Xu M.D.   On: 06/14/2024 13:41   DG HIP UNILAT WITH PELVIS 2-3 VIEWS RIGHT Result Date: 06/14/2024 CLINICAL DATA:  [id number] Hip pain [id number] EXAM: DG HIP (WITH OR WITHOUT PELVIS) 2-3V RIGHT COMPARISON:  March 09, 2023 FINDINGS: No evidence of pelvic fracture or diastasis.No acute hip fracture or dislocation.Moderate joint space loss of the left hip. Degenerative disc disease of the visualized lumbar spine.Soft tissues are unremarkable. IMPRESSION: 1. No acute fracture, pelvic bone diastasis, or dislocation. 2. Moderate osteoarthritis of the left hip. Electronically Signed   By:  Rogelia Myers M.D.   On: 06/14/2024 13:20   ECHOCARDIOGRAM COMPLETE Result Date: 06/11/2024    ECHOCARDIOGRAM REPORT   Patient Name:   Julie Mcbride Date of Exam: 06/11/2024 Medical Rec #:  [id number]        Height:       65.0 in Accession #:    [id number]       Weight:       210.9 lb Date of Birth:  1936/05/01       BSA:          2.023 m Patient Age:    87 years         BP:           113/66 mmHg Patient Gender: F                HR:           91 bpm. Exam Location:  [facility] Procedure: 2D Echo, Cardiac Doppler and Color Doppler (Both Spectral and Color            Flow Doppler were utilized during procedure). Indications:     pulmonary embolus I26.09  History:         Patient has prior history of Echocardiogram examinations, most                  recent 12/26/2020. Previous Myocardial Infarction,                  Signs/Symptoms:Dyspnea; Risk Factors:Hypertension.  Sonographer:     Christopher Furnace Referring Phys:  [id number]  JAN A MANSY Diagnosing Phys: Lonni Hanson MD IMPRESSIONS  1. Left ventricular ejection fraction, by estimation, is 65 to 70%. The left ventricle has normal function. The left ventricle has no regional wall motion abnormalities. There is moderate left ventricular hypertrophy. Left ventricular diastolic parameters are indeterminate. There is the interventricular septum is flattened in diastole ('D' shaped left ventricle), consistent with right ventricular volume overload.  2. Pulmonary artery pressure is mildly elevated (RVSP 35 mmHg plus central venous/right atrial pressure). Right ventricular systolic function is moderately reduced. The right ventricular size is moderately enlarged.  3. The mitral valve is degenerative. Trivial mitral valve regurgitation. No evidence of mitral stenosis.  4. The aortic valve is tricuspid. Aortic valve regurgitation is not visualized. No aortic stenosis is present. FINDINGS  Left Ventricle: Left ventricular ejection fraction, by estimation, is 65 to 70%. The left ventricle has normal function. The left ventricle has no regional wall motion abnormalities. The left ventricular internal cavity size was normal in size. There is  moderate left ventricular hypertrophy. The interventricular septum is flattened in diastole ('D' shaped left ventricle), consistent with right ventricular volume overload. Left ventricular diastolic parameters are indeterminate. Right Ventricle: Pulmonary artery pressure is mildly elevated (RVSP 35 mmHg plus central venous/right atrial pressure). The right ventricular size is moderately enlarged. No increase in right ventricular wall thickness. Right ventricular systolic function is moderately reduced. Left Atrium: Left atrial size was normal in size. Right Atrium: Right atrial size was normal in size. Pericardium: There is no evidence of pericardial effusion. Mitral Valve: The mitral valve is degenerative in appearance. There is mild thickening of the mitral  valve leaflet(s). Mild mitral annular calcification. Trivial mitral valve regurgitation. No evidence of mitral valve stenosis. Tricuspid Valve: The tricuspid valve is normal in structure. Tricuspid valve regurgitation is mild. Aortic Valve: The aortic valve is tricuspid. Aortic valve regurgitation is not visualized. No aortic stenosis is present.  Aortic valve mean gradient measures 3.0 mmHg. Aortic valve peak gradient measures 5.2 mmHg. Aortic valve area, by VTI measures 2.53 cm. Pulmonic Valve: The pulmonic valve was not well visualized. Pulmonic valve regurgitation is not visualized. No evidence of pulmonic stenosis. Aorta: The aortic root is normal in size and structure. Pulmonary Artery: The pulmonary artery is not well seen. Venous: The inferior vena cava was not well visualized. IAS/Shunts: The interatrial septum was not well visualized.  LEFT VENTRICLE PLAX 2D LVIDd:         3.30 cm   Diastology LVIDs:         2.20 cm   LV e' medial:    4.79 cm/s LV PW:         1.20 cm   LV E/e' medial:  15.6 LV IVS:        1.38 cm   LV e' lateral:   11.50 cm/s LVOT diam:     2.00 cm   LV E/e' lateral: 6.5 LV SV:         44 LV SV Index:   22 LVOT Area:     3.14 cm  RIGHT VENTRICLE RV Basal diam:  4.00 cm RV Mid diam:    4.00 cm LEFT ATRIUM             Index        RIGHT ATRIUM           Index LA diam:        3.00 cm 1.48 cm/m   RA Area:     17.90 cm LA Vol (A2C):   42.0 ml 20.76 ml/m  RA Volume:   51.30 ml  25.36 ml/m LA Vol (A4C):   18.7 ml 9.24 ml/m LA Biplane Vol: 29.2 ml 14.43 ml/m  AORTIC VALVE AV Area (Vmax):    2.51 cm AV Area (Vmean):   2.20 cm AV Area (VTI):     2.53 cm AV Vmax:           114.00 cm/s AV Vmean:          88.200 cm/s AV VTI:            0.174 m AV Peak Grad:      5.2 mmHg AV Mean Grad:      3.0 mmHg LVOT Vmax:         91.00 cm/s LVOT Vmean:        61.700 cm/s LVOT VTI:          0.140 m LVOT/AV VTI ratio: 0.80  AORTA Ao Root diam: 3.20 cm MITRAL VALVE                TRICUSPID VALVE MV Area  (PHT): 5.97 cm     TR Peak grad:   34.8 mmHg MV Decel Time: 127 msec     TR Vmax:        295.00 cm/s MV E velocity: 74.90 cm/s MV A velocity: 125.00 cm/s  SHUNTS MV E/A ratio:  0.60         Systemic VTI:  0.14 m                             Systemic Diam: 2.00 cm Lonni Hanson MD Electronically signed by Lonni Hanson MD Signature Date/Time: 06/11/2024/3:14:06 PM    Final    PERIPHERAL VASCULAR CATHETERIZATION Result Date: 06/11/2024 See surgical note for result.  US  Venous Img Lower Bilateral (DVT) Result Date:  06/11/2024 CLINICAL DATA:  Patient with pulmonary embolism.  Evaluate for DVT. EXAM: BILATERAL LOWER EXTREMITY VENOUS DOPPLER ULTRASOUND TECHNIQUE: Gray-scale sonography with graded compression, as well as color Doppler and duplex ultrasound were performed to evaluate the lower extremity deep venous systems from the level of the common femoral vein and including the common femoral, femoral, profunda femoral, popliteal and calf veins including the posterior tibial, peroneal and gastrocnemius veins when visible. The superficial great saphenous vein was also interrogated. Spectral Doppler was utilized to evaluate flow at rest and with distal augmentation maneuvers in the common femoral, femoral and popliteal veins. COMPARISON:  None Available. FINDINGS: RIGHT LOWER EXTREMITY Common Femoral Vein: No evidence of thrombus. Normal compressibility, respiratory phasicity and response to augmentation. Saphenofemoral Junction: No evidence of thrombus. Normal compressibility and flow on color Doppler imaging. Profunda Femoral Vein: No evidence of thrombus. Normal compressibility and flow on color Doppler imaging. Femoral Vein: Examination is positive for nonocclusive thrombus within the proximal right femoral vein. Popliteal Vein: No evidence of thrombus. Normal compressibility, respiratory phasicity and response to augmentation. Calf Veins: No evidence of thrombus. Normal compressibility and flow on color  Doppler imaging. Superficial Great Saphenous Vein: No evidence of thrombus. Normal compressibility. Venous Reflux:  None. Other Findings:  None. LEFT LOWER EXTREMITY Common Femoral Vein: No evidence of thrombus. Normal compressibility, respiratory phasicity and response to augmentation. Saphenofemoral Junction: No evidence of thrombus. Normal compressibility and flow on color Doppler imaging. Profunda Femoral Vein: No evidence of thrombus. Normal compressibility and flow on color Doppler imaging. Femoral Vein: No evidence of thrombus. Normal compressibility, respiratory phasicity and response to augmentation. Popliteal Vein: No evidence of thrombus. Normal compressibility, respiratory phasicity and response to augmentation. Calf Veins: No evidence of thrombus. Normal compressibility and flow on color Doppler imaging. Superficial Great Saphenous Vein: No evidence of thrombus. Normal compressibility. Venous Reflux:  None. Other Findings:  None. IMPRESSION: 1. Examination is positive for nonocclusive thrombus within the proximal right femoral vein. 2. No evidence for left lower extremity deep venous thrombosis. Electronically Signed   By: Waddell Calk M.D.   On: 06/11/2024 06:39   CT Angio Chest PE W and/or Wo Contrast Result Date: 06/11/2024 CLINICAL DATA:  Shortness of breath and left-sided chest pain, known history of lung carcinoma EXAM: CT ANGIOGRAPHY CHEST WITH CONTRAST TECHNIQUE: Multidetector CT imaging of the chest was performed using the standard protocol during bolus administration of intravenous contrast. Multiplanar CT image reconstructions and MIPs were obtained to evaluate the vascular anatomy. RADIATION DOSE REDUCTION: This exam was performed according to the departmental dose-optimization program which includes automated exposure control, adjustment of the mA and/or kV according to patient size and/or use of iterative reconstruction technique. CONTRAST:  75mL OMNIPAQUE  IOHEXOL  350 MG/ML SOLN  COMPARISON:  05/24/2024 FINDINGS: Cardiovascular: Atherosclerotic calcifications of the thoracic aorta are noted. No aneurysmal dilatation or dissection is seen. The pulmonary artery shows a normal branching pattern bilaterally. Extensive pulmonary emboli are noted right greater than left. There are changes consistent with right heart strain with an RV/LV ratio of 1.5. Mediastinum/Nodes: Thoracic inlet is within normal limits. No hilar or mediastinal adenopathy is noted. The esophagus as visualized is within normal limits. Sliding-type hiatal hernia is seen. Lungs/Pleura: Small left effusion is noted. Scattered nodular densities are seen similar to that noted on prior exam consistent with metastatic disease. The largest index lesion in the left lower lobe measures approximately 17 mm in greatest dimension stable from the prior study. Similar findings are noted within the right lung  although a large pleural effusion is noted increased when compared with the prior exam. Index lesion in the right lower lobe measures 17 mm also stable from the prior exam. Upper Abdomen: Visualized upper abdomen shows stable cyst in the left lobe of the liver. Musculoskeletal: Degenerative changes of the thoracic spine are noted. No compression deformity is noted. Review of the MIP images confirms the above findings. IMPRESSION: Extensive bilateral pulmonary emboli with evidence of right heart strain. The RV/LV ratio measures 1.5. Scattered pulmonary nodules again identified consistent with metastatic disease. The overall appearance is similar to that seen on the recent exam. Aortic Atherosclerosis (ICD10-I70.0). Critical Value/emergent results were called by telephone at the time of interpretation on 06/11/2024 at 1:24 am to Dr. KEVIN PADUCHOWSKI , who verbally acknowledged these results. Electronically Signed   By: Oneil Devonshire M.D.   On: 06/11/2024 01:26   DG Chest Port 1 View Result Date: 06/10/2024 CLINICAL DATA:  Shortness of  breath EXAM: PORTABLE CHEST 1 VIEW COMPARISON:  Chest x-ray 05/24/2024.  CT of the chest 05/24/2024. FINDINGS: Heart is enlarged. There are small bilateral pleural effusions. There scattered bilateral patchy airspace opacities in both lungs increasing in the left upper lobe. Scattered nodular densities are grossly unchanged. No pneumothorax or acute fracture identified. IMPRESSION: 1. Scattered bilateral patchy airspace opacities in both lungs increasing in the left upper lobe. Findings may represent edema or infection. 2. Small bilateral pleural effusions. 3. Stable scattered nodular densities. 4. Cardiomegaly. Electronically Signed   By: Greig Pique M.D.   On: 06/10/2024 23:14   CT CHEST WO CONTRAST Result Date: 06/02/2024 CLINICAL DATA:  Pulmonary nodules EXAM: CT CHEST WITHOUT CONTRAST TECHNIQUE: Multidetector CT imaging of the chest was performed following the standard protocol without IV contrast. RADIATION DOSE REDUCTION: This exam was performed according to the departmental dose-optimization program which includes automated exposure control, adjustment of the mA and/or kV according to patient size and/or use of iterative reconstruction technique. COMPARISON:  05/21/2024 PET-CT FINDINGS: Cardiovascular: Atheromatous vascular calcification of the thoracic aorta branch vessels. Mild mitral valve calcification. Mediastinum/Nodes: Small to moderate-sized hiatal hernia. Scattered small subpectoral and axillary lymph nodes are not pathologically enlarged. There is evidence of right hilar and low right paratracheal adenopathy as shown on recent PET-CT. Index right lower paratracheal node 1.1 cm in short axis on image 53 series 2, formerly the same on 05/10/2024. Lungs/Pleura: Numerous scattered lung nodules are again identified. Index nodule in the superior segment right lower lobe was previously hypermetabolic root A's prior to this exam and measures 1.1 by 0.8 cm on image 52 series 4. A left lower lobe nodule  measuring 1.8 by 1.1 cm on image 87 of series 4 was previously hypermetabolic on recent PET-CT and similar size. At least 20 other irregular pulmonary nodules are identified scattered in the lungs. Stable small right and trace left pleural effusion. Upper Abdomen: Fluid density hepatic cysts. Abdominal aortic atherosclerosis. Left renal artery stent. Indistinct nodularity along the left adrenal gland is nonspecific but was not recently hypermetabolic and may well be incidental. The hypermetabolic liver lesions seen on recent PET-CT are not readily visible on today's noncontrast CT examination. Musculoskeletal: The patient has known skeletal hypermetabolic lesions including a lesion in the left humerus not well characterized on this exam. Thoracic kyphosis and spondylosis. Lower cervical plate and screw fixator. IMPRESSION: 1. Numerous scattered lung nodules are again identified, compatible with metastatic disease particularly given that many were hypermetabolic on recent PET-CT. 2. Right hilar and low right  paratracheal adenopathy is again identified, favoring malignancy. 3. The hypermetabolic liver lesions seen on recent PET-CT are not readily visible on today's noncontrast CT examination. 4. The patient has known skeletal hypermetabolic lesions including a lesion in the left humerus not well characterized on this exam. 5. Stable small right and trace left pleural effusion. 6. Small to moderate-sized hiatal hernia. 7.  Aortic Atherosclerosis (ICD10-I70.0). Electronically Signed   By: Ryan Salvage M.D.   On: 06/02/2024 17:16   DG Chest Port 1 View Result Date: 05/24/2024 CLINICAL DATA:  [id number] S/P bronchoscopy [id number] EXAM: PORTABLE CHEST - 1 VIEW COMPARISON:  May 21, 2024, May 10, 2024 FINDINGS: Lower lung volumes. Bilateral perihilar interstitial opacities. Blunting of the right costophrenic sulcus. No pneumothorax. The lung nodules on the prior CT are not well evaluated by plain radiography. No  cardiomegaly. Aortic atherosclerosis. No acute fracture or destructive lesions. Multilevel thoracic osteophytosis. Cervical fusion hardware. IMPRESSION: 1. Low lung volumes with bilateral perihilar interstitial opacities, which may represent bronchovascular crowding or interstitial edema. Trace right pleural effusion. No pneumothorax. 2. The bilateral pulmonary nodules on the prior CT are not well evaluated by plain radiography. Electronically Signed   By: Rogelia Myers M.D.   On: 05/24/2024 16:14   DG C-Arm 1-60 Min-No Report Result Date: 05/24/2024 Fluoroscopy was utilized by the requesting physician.  No radiographic interpretation.   DG C-Arm 1-60 Min-No Report Result Date: 05/24/2024 Fluoroscopy was utilized by the requesting physician.  No radiographic interpretation.   NM PET Image Initial (PI) Skull Base To Thigh Addendum Date: 05/23/2024 ADDENDUM REPORT: 05/23/2024 12:28 ADDENDUM: Additional impression. On the very first image is a slight area of asymmetric uptake along the right frontal region of intracranial compartment. This could be technical. In light of the other findings however if there is further concern additional MRI may be useful to exclude underlying lesion. Electronically Signed   By: Ranell Bring M.D.   On: 05/23/2024 12:28   Result Date: 05/23/2024 CLINICAL DATA:  Initial treatment strategy for multiple lung nodules. EXAM: NUCLEAR MEDICINE PET SKULL BASE TO THIGH TECHNIQUE: 10.15 mCi F-18 FDG was injected intravenously. Full-ring PET imaging was performed from the skull base to thigh after the radiotracer. CT data was obtained and used for attenuation correction and anatomic localization. Fasting blood glucose: 133 mg/dl COMPARISON:  CTA chest 05/10/2024 FINDINGS: Mediastinal blood pool activity: SUV max 3.1 Liver activity: SUV max 3.9 NECK: No specific abnormal uptake seen in the neck including along lymph node chains of the submandibular, posterior triangle or internal jugular  regions. Slight asymmetric area of uptake along the right frontal region, this could be a physiologic variant. Incidental CT findings: Nasal septal deviation identified. Small rounded opacity along the right maxillary sinus, small mucous retention cyst or polyp. Visualized portions of the mastoid air cells clear. Streak artifact related to the patient's dental hardware and cervical spine fixation hardware. Small thyroid gland. The submandibular glands and parotid glands are preserved. CHEST: As seen on the recent CT angiogram of the chest from 05/10/2024 there are numerous scattered bilateral lung nodules identified. Many of which show abnormal uptake. There is some limitation today with motion. Lesion seen in the left lower lobe on the prior examination which would have measured 15 by 12 mm, today is seen on image 54 and has some uptake of maximum SUV 4.2. Lesion seen in the anterior right middle lobe maximum SUV of 6.6 is identified on image 47, measuring 12 mm. Several lesions identified do not show  significant abnormal uptake. Some of this could relate to also small lesions are versus motion. There are several hypermetabolic lymph nodes identified in the right axillary region. Maximum SUV value of 8.5. Is also mediastinal nodes including a precarinal lesion which has maximum SUV value 6.1 and measures on image 40 in short axis of 10 mm. Similar size to the recent prior. Is also mild uptake along the left hilum. No abnormal uptake along the axillary regions. Incidental CT findings: Persistent small right pleural effusion. There is slight increase in tiny left pleural effusion. Moderate hiatal hernia. Scattered vascular calcifications are identified. No pericardial effusion. ABDOMEN/PELVIS: There are 3 hypermetabolic lesions identified. Example in segment 4A on image 57 has maximum SUV value of 8.2. Focus seen towards the caudate lobe on image 66 has maximum SUV value of 9.2. There lesion is seen in segment 3 with  maximum SUV value 10.3 on image 74. These lesions are not well seen on the CT scan. Otherwise there is physiologic distribution radiotracer along the parenchymal organs, bowel and renal collecting systems. No abnormal nodal uptake identified in the abdomen and pelvis. Incidental CT findings: Significant motion. Benign separate hepatic cystic lesions are identified. Gallbladder is nondilated. Grossly the spleen, adrenal glands and pancreas are unremarkable. Mild bilateral renal atrophy. Calcifications along the right kidney. Contracted urinary bladder. Large bowel has a normal course and caliber with scattered colonic stool and colonic diverticula. Small bowel is nondilated. Note is made of a left renal artery stent. SKELETON: Multifocal areas of abnormal bony uptake consistent bone metastases. These include the left femoral neck, the left ischial bone, bilateral iliac bones, bilateral ribs and the left humeral head. Example left humeral head lesion has sclerosis on image 21 and maximum SUV value of 11.0. Left superior iliac crest lesion also sclerosis on CT image 95 and maximum SUV of 6.6. Left femoral neck lesion again with sclerosis on image 133 has maximum SUV of 9.2. of note there is also a a photopenic appearance to the T8 vertebral body with some surrounding mild increased uptake. Please correlate with spine CT scan of 05/10/2024. Incidental CT findings: Curvature of the spine. Diffuse degenerative changes identified. IMPRESSION: Multifocal hypermetabolic metastatic disease. Several lesions scattered throughout the skeleton. Multiple lung nodules are identified as on prior examination. Some of these do not show significant uptake while others show clear abnormal uptake. There is significant breathing motion. Abnormal mediastinal and right hilar lymph nodes. At least 3 hypermetabolic liver lesions. If needed additional workup with liver MRI as clinically appropriate. Electronically Signed: By: Ranell Bring M.D.  On: 05/23/2024 11:48    Microbiology: Results for orders placed or performed during the hospital encounter of 06/10/24  MRSA Next Gen by PCR, Nasal     Status: None   Collection Time: 06/11/24  2:49 PM   Specimen: Nasal Mucosa; Nasal Swab  Result Value Ref Range Status   MRSA by PCR Next Gen NOT DETECTED NOT DETECTED Final    Comment: (NOTE) The GeneXpert MRSA Assay (FDA approved for NASAL specimens only), is one component of a comprehensive MRSA colonization surveillance program. It is not intended to diagnose MRSA infection nor to guide or monitor treatment for MRSA infections. Test performance is not FDA approved in patients less than 52 years old. Performed at [facility], [address] [place], [place] [address]   Expectorated Sputum Assessment w Gram Stain, Rflx to Resp Cult     Status: None   Collection Time: 06/14/24 12:41 PM  Specimen: Sputum  Result Value Ref Range Status   Specimen Description SPUTUM  Final   Special Requests NONE  Final   Sputum evaluation   Final    Sputum specimen not acceptable for testing.  Please recollect.   RESULT CALLED TO, READ BACK BY AND VERIFIED WITH: RONAL BARTHEL 06/14/24 1628 KLW Performed at [facility], [address] [place], [place] [address]    Report Status 06/14/2024 FINAL  Final  SARS Coronavirus 2 by RT PCR (hospital order, performed in [facility] hospital lab) *cepheid single result test* Anterior Nasal Swab     Status: None   Collection Time: 06/14/24  2:15 PM   Specimen: Anterior Nasal Swab  Result Value Ref Range Status   SARS Coronavirus 2 by RT PCR NEGATIVE NEGATIVE Final    Comment: (NOTE) SARS-CoV-2 target nucleic acids are NOT DETECTED.  The SARS-CoV-2 RNA is generally detectable in upper and lower respiratory specimens during the acute phase of infection. The lowest concentration of SARS-CoV-2 viral copies this assay can detect is 250 copies / mL. A negative result does not preclude  SARS-CoV-2 infection and should not be used as the sole basis for treatment or other patient management decisions.  A negative result may occur with improper specimen collection / handling, submission of specimen other than nasopharyngeal swab, presence of viral mutation(s) within the areas targeted by this assay, and inadequate number of viral copies (<250 copies / mL). A negative result must be combined with clinical observations, patient history, and epidemiological information.  Fact Sheet for Patients:   RoadLapTop.co.za  Fact Sheet for Healthcare Providers: http://kim-miller.com/  This test is not yet approved or  cleared by the [place]  FDA and has been authorized for detection and/or diagnosis of SARS-CoV-2 by FDA under an Emergency Use Authorization (EUA).  This EUA will remain in effect (meaning this test can be used) for the duration of the COVID-19 declaration under Section 564(b)(1) of the Act, 21 U.S.C. section 360bbb-3(b)(1), unless the authorization is terminated or revoked sooner.  Performed at [facility], [address] [place], [place] [address]   Respiratory (~20 pathogens) panel by PCR     Status: None   Collection Time: 06/14/24  2:15 PM   Specimen: Nasopharyngeal Swab; Respiratory  Result Value Ref Range Status   Adenovirus NOT DETECTED NOT DETECTED Final   Coronavirus 229E NOT DETECTED NOT DETECTED Final    Comment: (NOTE) The Coronavirus on the Respiratory Panel, DOES NOT test for the novel  Coronavirus (2019 nCoV)    Coronavirus HKU1 NOT DETECTED NOT DETECTED Final   Coronavirus NL63 NOT DETECTED NOT DETECTED Final   Coronavirus OC43 NOT DETECTED NOT DETECTED Final   Metapneumovirus NOT DETECTED NOT DETECTED Final   Rhinovirus / Enterovirus NOT DETECTED NOT DETECTED Final   Influenza A NOT DETECTED NOT DETECTED Final   Influenza B NOT DETECTED NOT DETECTED Final   Parainfluenza Virus 1  NOT DETECTED NOT DETECTED Final   Parainfluenza Virus 2 NOT DETECTED NOT DETECTED Final   Parainfluenza Virus 3 NOT DETECTED NOT DETECTED Final   Parainfluenza Virus 4 NOT DETECTED NOT DETECTED Final   Respiratory Syncytial Virus NOT DETECTED NOT DETECTED Final   Bordetella pertussis NOT DETECTED NOT DETECTED Final   Bordetella Parapertussis NOT DETECTED NOT DETECTED Final   Chlamydophila pneumoniae NOT DETECTED NOT DETECTED Final   Mycoplasma pneumoniae NOT DETECTED NOT DETECTED Final    Comment: Performed at [facility], [address], [place],  [place] [address]    Labs: CBC: Recent Labs  Lab 06/13/24 0324 06/14/24 0549 06/17/24 0507  WBC 7.3 5.9 8.3  HGB 8.7* 8.8* 8.4*  HCT 27.5* 27.5* 26.9*  MCV 93.5 93.2 93.4  PLT 131* 124* 183   Basic Metabolic Panel: Recent Labs  Lab 06/13/24 0324 06/14/24 0549 06/15/24 0519 06/16/24 0449 06/17/24 0507  NA 139 138 141 139 137  K 4.1 4.1 4.1 4.2 3.9  CL 106 105 105 102 98  CO2 25 27 29 26 30   GLUCOSE 123* 118* 123* 119* 117*  BUN 32* 32* 32* 31* 32*  CREATININE 1.14* 1.13* 1.15* 1.23* 1.26*  CALCIUM  8.7* 8.8* 9.0 8.4* 8.9   Liver Function Tests: No results for input(s): AST, ALT, ALKPHOS, BILITOT, PROT, ALBUMIN in the last 168 hours. CBG: No results for input(s): GLUCAP in the last 168 hours.  Discharge time spent: greater than 30 minutes.  Signed: Murvin Mana, MD [facility] 06/19/2024

## 2024-06-19 NOTE — Plan of Care (Signed)

## 2024-06-19 NOTE — Hospital Course (Signed)
 Julie Mcbride is a 88 y.o. Caucasian female with medical history significant for recently diagnosed stage IV lung cancer, followed by Dr. Jacobo, osteoarthritis, diverticulitis, essential hypertension, Alzheimer's dementia and MI who presented to the emergency room with acute onset of shortness of breath.  Patient had a severe hypoxemia, initially required high flow oxygen.  CT angiogram showed extensive bilateral pulm emboli with right heart strain. Bilateral pulmonary thrombectomy was performed on 7/15.  Postoperatively, patient was treated with Eliquis . Condition has been stabilized, currently pending nursing home placement.

## 2024-06-19 NOTE — TOC Progression Note (Addendum)
 Transition of Care [facility]) - Progression Note    Patient Details  Name: Julie Mcbride MRN: [id number] Date of Birth: Apr 26, 1936  [facility]) CM/SW Contact  Elouise LULLA Capri, RN 06/19/2024, 10:22 AM  Clinical Narrative:     CM returned call to patient's daughter, [name], phone: [phone] regarding patient insurance status. CM informed patient's daughter, no insurance auth at this time. CM will follow up.   CM to patient's room regarding update, CM awaiting insurance authorization. Patient verbalized understanding and agreement.   CM returned call to [place], Admissions, [facility], phone: [phone] regarding patient status. No answer, CM left message for return call.   CM follow up call placed to [facility], phone: [phone] regarding authorization status. CM spoke to [place]. Per [place], SNF auth is [id number] and BLS transport shara is [phone].   CM secure message to [facility] regarding auth approval.   CM call to Admissions, [facility], phone: [phone]. CM spoke to [place] regarding SNF auth approval.  Per Sonny, SNF can accept patient today to Room E-14.  Expected Discharge Plan and Services    SNF     Social Drivers of Health (SDOH) Interventions SDOH Screenings   Food Insecurity: No Food Insecurity (06/13/2024)  Housing: Low Risk  (06/13/2024)  Transportation Needs: No Transportation Needs (06/13/2024)  Utilities: Not At Risk (06/13/2024)  Depression (PHQ2-9): Low Risk  (05/30/2024)  Financial Resource Strain: Low Risk  (05/15/2024)   Received from [facility]  Social Connections: Socially Integrated (06/13/2024)  Tobacco Use: Low Risk  (06/11/2024)    Readmission Risk Interventions     No data to display

## 2024-06-19 NOTE — Progress Notes (Addendum)
 Progress Note   Patient: Julie Mcbride FMW:[id number] DOB: 1936/11/26 DOA: 06/10/2024     8 DOS: the patient was seen and examined on 06/19/2024   Brief hospital course: ABAGAEL KRAMM is a 88 y.o. Caucasian female with medical history significant for recently diagnosed stage IV lung cancer, followed by Dr. Jacobo, osteoarthritis, diverticulitis, essential hypertension, Alzheimer's dementia and MI who presented to the emergency room with acute onset of shortness of breath.  Patient had a severe hypoxemia, initially required high flow oxygen.  CT angiogram showed extensive bilateral pulm emboli with right heart strain. Bilateral pulmonary thrombectomy was performed on 7/15.  Postoperatively, patient was treated with Eliquis . Condition has been stabilized, currently pending nursing home placement.   Principal Problem:   Acute saddle pulmonary embolism (HCC) Active Problems:   Dyslipidemia   Essential hypertension   Ischemic cardiomyopathy   CKD stage 3a, GFR 45-59 ml/min (HCC)   Renal artery stenosis (HCC)   Adenocarcinoma, lung, unspecified laterality (HCC)   Class 2 obesity   Restless leg syndrome   CAD (coronary artery disease)   Dementia (HCC)   Palliative care encounter   Pulmonary embolus (HCC)   Assessment and Plan:  # Bilateral PE post thrombectomy. # Right femoral DVT Acute hypoxemic respite failure secondary to pulmonary emboli. Acute right-sided congestive heart failure secondary to acute cor pulmonale secondary to acute PE S/p thrombectomy Provoked by malignancy. With RV dysfunction, requiring O2.  Cardiogram showed moderate hypertension with moderate RV systolic dysfunction, action fraction normal. Condition improving, currently on 2 L oxygen, will continue wean off oxygen. --cont Eliquis    # Ischemic cardiomyopathy Chronic Congestive Heart Failure. Does not have volume overload.   # Chronic low back pain on chronic opioids Right lower back, says this  is a chronic problem present for years, some days better than others. CT of lumbar and thoracic spine last month showed mild diffuse degenerative disease throughout the lumbar and thoracic spine. No mets seen. Stable Continue as needed pain medicine.   # Stage 4 lung cancer Recently established with dr. Jacobo. Onc palliative has seen here in the hospital. --MRI brain No acute intracranial abnormality.  Referred by oncology after discharge.   # CKD 3a # Renal artery stenosis status post stent Renal function still stable. Aspirin .   # HTN Blood pressure well-controlled with bisoprolol  and hydralazine .   # Restless leg - home requip    #  Class I Obesity. BMI 34.89 Diet and excercise   Dysphagia Relayed by patient to nurse. SLP has cleared for regular diet   # Debility .  Nursing home placement     Subjective:  Patient doing well today, still complaining of back pain.  Otherwise no short of breath.  Oxygen 2 L.  Physical Exam: Vitals:   06/19/24 0104 06/19/24 0344 06/19/24 0805 06/19/24 0909  BP:  (!) 105/39 (!) 116/51 (!) 116/51  Pulse:  66 66   Resp:  15 16   Temp:  97.9 F (36.6 C) 98.3 F (36.8 C)   TempSrc:      SpO2: 95% 94% 93%   Weight:      Height:       General exam: Appears calm and comfortable  Respiratory system: Clear to auscultation. Respiratory effort normal. Cardiovascular system: S1 & S2 heard, RRR. No JVD, murmurs, rubs, gallops or clicks. No pedal edema. Gastrointestinal system: Abdomen is nondistended, soft and nontender. No organomegaly or masses felt. Normal bowel sounds heard. Central nervous system: Alert and oriented. No  focal neurological deficits. Extremities: Symmetric 5 x 5 power. Skin: No rashes, lesions or ulcers Psychiatry:  Mood & affect appropriate.    Data Reviewed:  Results reviewed.  Family Communication: Son updated at bedside, daughter updated over the phone.  Disposition: Status is: Inpatient Remains inpatient  appropriate because: Unsafe discharge home pending nursing home placement.     Time spent: 50 minutes  Author: Murvin Mana, MD 06/19/2024 2:10 PM  For on call review [email or web address].

## 2024-06-19 NOTE — TOC Transition Note (Addendum)
 Transition of Care [facility]) - Discharge Note   Patient Details  Name: SUMAYA RIEDESEL MRN: [id number] Date of Birth: 1936/09/15  Transition [facility]) CM/SW Contact:  Elouise LULLA Capri, RN 06/19/2024, 3:24 PM   Clinical Narrative:     Patient to discharge to [facility], [place], [place] via Geographical information systems officer at 1800. Per Sonny, Admissions, [facility], patient will admit to Room E-14.   Discharge order, discharge summary, SNF transfer note faxed to [facility]. CM call to [facility], Admissions, [facility],regarding discharge summary faxed and BLS transport time of 1800.  Final next level of care: Skilled Nursing Facility Barriers to Discharge: No Barriers Identified   Patient Goals and CMS Choice    SNF  Discharge Placement      SNF          Discharge Plan and Services Additional resources added to the After Visit Summary for        Social Drivers of Health (SDOH) Interventions SDOH Screenings   Food Insecurity: No Food Insecurity (06/13/2024)  Housing: Low Risk  (06/13/2024)  Transportation Needs: No Transportation Needs (06/13/2024)  Utilities: Not At Risk (06/13/2024)  Depression (PHQ2-9): Low Risk  (05/30/2024)  Financial Resource Strain: Low Risk  (05/15/2024)   Received from [facility]  Social Connections: Socially Integrated (06/13/2024)  Tobacco Use: Low Risk  (06/11/2024)     Readmission Risk Interventions     No data to display

## 2024-06-19 NOTE — Plan of Care (Signed)

## 2024-06-19 NOTE — Progress Notes (Signed)
 Physical Therapy Treatment Patient Details Name: Julie Mcbride MRN: [id number] DOB: 09-25-1936 Today's Date: 06/19/2024   History of Present Illness Pt is an 88 year old female admitted on 06/11/2024 with DVT/PE and underwent mechanical thrombectomy.    PMH significant for recently diagnosed stage IV lung cancer, followed by Dr. Jacobo, osteoarthritis, diverticulitis, GERD, essential hypertension, Alzheimer's dementia and MI    PT Comments  Pt sitting on the [facility] upon entry, concerns of increased SOB and pain on the R hip, but was agreeable to therapy overall. PT focused today's session on increasing ambulation distance. During session pt was able to perform bed mobility (sit to supine) with Mod A x 1 (for BLE management ), performed 3x trials of STS ( 1st trial from [facility] with Mod A, 2nd trial from [facility] with bed height elevated with Mod A to maintain balance while NT helped donn brief, 3rd STS from [facility] elevated prior to ambualtion) and was able to ambulate 32 with RW and CGA. Ambulation distance continues to be limited by pain (8/10 at R hip, that is persistent with movement) and reports of SOB. Despite aforementioned limitations, pt continued to make improvements compared to pervious session, in distance traversed. Throughout treatment, pt required 2L O2 via Ama, with ambulation SpO2 dropped to 88% but recovered quickly with an extended seated rest break.Pt would benefit from skilled PT to continue to work towards PT goals and return to PLOF.      If plan is discharge home, recommend the following: A little help with walking and/or transfers;A little help with bathing/dressing/bathroom;Assistance with cooking/housework;Assist for transportation;Help with stairs or ramp for entrance   Can travel by private vehicle     Yes  Equipment Recommendations  Other (comment) (TBD)    Recommendations for Other Services Rehab consult     Precautions / Restrictions Precautions Precautions: Fall Recall  of Precautions/Restrictions: Impaired Restrictions Weight Bearing Restrictions Per Provider Order: No     Mobility  Bed Mobility Overal bed mobility: Needs Assistance Bed Mobility: Sit to Supine       Sit to supine: Mod assist, Used rails, Max assist (for BLE management)        Transfers Overall transfer level: Needs assistance Equipment used: Rolling walker (2 wheels) Transfers: Sit to/from Stand Sit to Stand: Min assist, Mod assist, From elevated surface           General transfer comment: Pt performed 3x trials of STS ( 1st trial from [facility] with Mod A, 2nd trial from [facility] with bed height elevated with Mod A to maintain balance while NT helped donn brief, 3rd STS from [facility] elevated prior to ambualtion)    Ambulation/Gait Ambulation/Gait assistance: Contact guard assist Gait Distance (Feet): 32 Feet Assistive device: Rolling walker (2 wheels) Gait Pattern/deviations: Step-through pattern Gait velocity: decreased     General Gait Details: Distance limited by pain and reported SOB         Balance Overall balance assessment: Needs assistance Sitting-balance support: Feet supported, No upper extremity supported Sitting balance-Leahy Scale: Good Sitting balance - Comments: Steady sitting balance reaching within BOS   Standing balance support: During functional activity, Bilateral upper extremity supported, Reliant on assistive device for balance Standing balance-Leahy Scale: Fair Standing balance comment: Pt reliant on RW to maintain balanc in stance while NT assisted with brief donning. During ambulation no overt LOB noted       Communication Communication Communication: No apparent difficulties  Cognition Arousal: Alert Behavior During Therapy: WFL for tasks assessed/performed  PT - Cognitive impairments: No apparent impairments       PT - Cognition Comments: Very pleasant and fearful of falling, anxious to move but with encouragement is willing to put  forth great efforts Following commands: Impaired Following commands impaired: Only follows one step commands consistently    Cueing Cueing Techniques: Verbal cues, Tactile cues, Visual cues, Gestural cues         Pertinent Vitals/Pain Pain Assessment Pain Assessment: 0-10 Pain Score: 8  (lessened to a 6/10 with rest) Pain Location: R hip Pain Descriptors / Indicators: Discomfort, Aching, Grimacing, Guarding Pain Intervention(s): Limited activity within patient's tolerance, Monitored during session, Premedicated before session, Patient requesting pain meds-RN notified     PT Goals (current goals can now be found in the care plan section) Acute Rehab PT Goals Patient Stated Goal: decreased pain, get better PT Goal Formulation: With patient/family Time For Goal Achievement: 06/27/24 Potential to Achieve Goals: Fair Progress towards PT goals: Progressing toward goals    Frequency    Min 2X/week       AM-PAC PT 6 Clicks Mobility   Outcome Measure  Help needed turning from your back to your side while in a flat bed without using bedrails?: A Little Help needed moving from lying on your back to sitting on the side of a flat bed without using bedrails?: A Little Help needed moving to and from a bed to a chair (including a wheelchair)?: A Little Help needed standing up from a chair using your arms (e.g., wheelchair or bedside chair)?: A Little Help needed to walk in hospital room?: A Little Help needed climbing 3-5 steps with a railing? : A Lot 6 Click Score: 17    End of Session Equipment Utilized During Treatment: Oxygen Activity Tolerance: Patient tolerated treatment well;Patient limited by fatigue Patient left: in chair;with call bell/phone within reach;with family/visitor present;with chair alarm set Nurse Communication: Mobility status PT Visit Diagnosis: Muscle weakness (generalized) (M62.81);Pain;Other abnormalities of gait and mobility (R26.89);Difficulty in  walking, not elsewhere classified (R26.2);Unsteadiness on feet (R26.81) Pain - Right/Left: Right Pain - part of body: Hip     Time: 9064-8986 PT Time Calculation (min) (ACUTE ONLY): 38 min  Charges:                 Nikko Quast, SPT 06/19/24, 11:16 AM

## 2024-06-20 DIAGNOSIS — R5381 Other malaise: Secondary | ICD-10-CM | POA: Diagnosis not present

## 2024-06-20 DIAGNOSIS — I2602 Saddle embolus of pulmonary artery with acute cor pulmonale: Secondary | ICD-10-CM | POA: Diagnosis not present

## 2024-06-20 DIAGNOSIS — I82411 Acute embolism and thrombosis of right femoral vein: Secondary | ICD-10-CM | POA: Diagnosis not present

## 2024-06-20 DIAGNOSIS — C3490 Malignant neoplasm of unspecified part of unspecified bronchus or lung: Secondary | ICD-10-CM | POA: Diagnosis not present

## 2024-06-21 DIAGNOSIS — R0602 Shortness of breath: Secondary | ICD-10-CM | POA: Diagnosis not present

## 2024-06-21 DIAGNOSIS — C3490 Malignant neoplasm of unspecified part of unspecified bronchus or lung: Secondary | ICD-10-CM | POA: Diagnosis not present

## 2024-06-21 DIAGNOSIS — G309 Alzheimer's disease, unspecified: Secondary | ICD-10-CM | POA: Diagnosis not present

## 2024-06-21 DIAGNOSIS — I2602 Saddle embolus of pulmonary artery with acute cor pulmonale: Secondary | ICD-10-CM | POA: Diagnosis not present

## 2024-06-24 ENCOUNTER — Encounter: Payer: Self-pay | Admitting: *Deleted

## 2024-06-24 DIAGNOSIS — J189 Pneumonia, unspecified organism: Secondary | ICD-10-CM | POA: Diagnosis not present

## 2024-06-24 DIAGNOSIS — I130 Hypertensive heart and chronic kidney disease with heart failure and stage 1 through stage 4 chronic kidney disease, or unspecified chronic kidney disease: Secondary | ICD-10-CM | POA: Diagnosis not present

## 2024-06-24 DIAGNOSIS — R0982 Postnasal drip: Secondary | ICD-10-CM | POA: Diagnosis not present

## 2024-06-24 DIAGNOSIS — F419 Anxiety disorder, unspecified: Secondary | ICD-10-CM | POA: Diagnosis not present

## 2024-06-25 DIAGNOSIS — F419 Anxiety disorder, unspecified: Secondary | ICD-10-CM | POA: Diagnosis not present

## 2024-06-25 DIAGNOSIS — C3490 Malignant neoplasm of unspecified part of unspecified bronchus or lung: Secondary | ICD-10-CM | POA: Diagnosis not present

## 2024-06-28 ENCOUNTER — Inpatient Hospital Stay: Attending: Oncology | Admitting: Oncology

## 2024-06-28 DIAGNOSIS — C3490 Malignant neoplasm of unspecified part of unspecified bronchus or lung: Secondary | ICD-10-CM

## 2024-06-28 NOTE — Progress Notes (Signed)
 [facility]  [phone]) [phone] [phone]) [phone]  ID: Julie Mcbride OB: 09-Jan-1936  MR#: [id number]  RDW#:[id number]  Patient Care Team: Alla Amis, MD as PCP - General (Family Medicine) Verdene Gills, RN as Oncology Nurse Navigator Jacobo, Evalene PARAS, MD as Consulting Physician (Oncology)  I connected with Julie Mcbride on 06/28/24 at 10:45 AM EDT by video enabled telemedicine visit and verified that I am speaking with the correct person using two identifiers.   I discussed the limitations, risks, security and privacy concerns of performing an evaluation and management service by telemedicine and the availability of in-person appointments. I also discussed with the patient that there may be a patient responsible charge related to this service. The patient expressed understanding and agreed to proceed.   Other persons participating in the visit and their role in the encounter: Patient, MD.  Patient's location: Home. Provider's location: Clinic.  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  PD-L1 80%.  INTERVAL HISTORY: Patient agreed to video-assisted telemedicine visit for further evaluation and discussion of her next generation sequencing results.  She has increased weakness and fatigue and remains in rehab.  She continues to have chronic shortness of breath required oxygen.  She does not complain of back pain today.  She has no neurologic complaints.  She denies any recent fevers.  She has a good appetite and denies weight loss.  She has no chest pain, cough, or hemoptysis.  She denies any nausea, vomiting, constipation, or diarrhea.  She has no urinary complaints.  Patient offers no further specific complaints today.  REVIEW OF SYSTEMS:   Review of Systems  Constitutional:  Positive for malaise/fatigue. Negative for fever and weight loss.  Respiratory: Negative.  Negative for cough,  hemoptysis and shortness of breath.   Cardiovascular: Negative.  Negative for chest pain and leg swelling.  Gastrointestinal: Negative.  Negative for abdominal pain.  Genitourinary: Negative.  Negative for dysuria.  Musculoskeletal:  Positive for back pain.  Skin: Negative.  Negative for rash.  Neurological:  Positive for weakness. Negative for dizziness, focal weakness and headaches.  Psychiatric/Behavioral: Negative.  The patient is not nervous/anxious.     As per HPI. Otherwise, a complete review of systems is negative.  PAST MEDICAL HISTORY: Past Medical History:  Diagnosis Date   Allergy    Anemia    Arthritis    Knees, hands   Basal cell carcinoma    back - removed   Bladder polyp    With previos hematuria   Diverticulitis    Dyspnea    Dyspnea on exertion    GERD (gastroesophageal reflux disease)    History of ETT    Myoview 7/10 EF 78%, no ischemia or infarction. Poor exercise tolerance (2'30, stopped due to fatigue). Reached 86% MPHR.   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 IVAR Mori   BACK SURGERY     BASAL CELL CARCINOMA EXCISION  back   BREAST BIOPSY Right 06/19/2017   Affirm Bx- Radial Scar   BREAST EXCISIONAL BIOPSY Right 1996   neg   BREAST LUMPECTOMY WITH NEEDLE LOCALIZATION Right 08/11/2017   Procedure: BREAST LUMPECTOMY WITH NEEDLE LOCALIZATION;  Surgeon: Claudene Larinda Bolder, MD;  Location: [facility];  Service: General;  Laterality: Right;   CARPAL TUNNEL RELEASE Right    CATARACT EXTRACTION W/ INTRAOCULAR LENS  IMPLANT,  BILATERAL     EXCISION OF BREAST BIOPSY Right 08/11/2017   excision of radial Scar   EYE SURGERY     JOINT REPLACEMENT Right    knee   KNEE ARTHROSCOPY Right 04/22/2016   Procedure: ARTHROSCOPY KNEE;  Surgeon: Helayne Glenn, MD;  Location: [facility];  Service: Orthopedics;  Laterality: Right;   knee replacement Right 01/11/2017   NASAL SINUS SURGERY  06/05/14   Dr. Milissa   POSTERIOR CERVICAL LAMINECTOMY  2005   C5-7; plate and screws   PULMONARY THROMBECTOMY Bilateral 06/11/2024   Procedure: PULMONARY THROMBECTOMY;  Surgeon: Jama Cordella MATSU, MD;  Location: [facility];  Service: Cardiovascular;  Laterality: Bilateral;   RENAL ANGIOGRAPHY Left 04/20/2020   Procedure: RENAL ANGIOGRAPHY;  Surgeon: Marea Selinda RAMAN, MD;  Location: [facility];  Service: Cardiovascular;  Laterality: Left;   TONSILLECTOMY     VIDEO BRONCHOSCOPY WITH ENDOBRONCHIAL NAVIGATION N/A 05/24/2024   Procedure: VIDEO BRONCHOSCOPY WITH ENDOBRONCHIAL NAVIGATION;  Surgeon: Parris Manna, MD;  Location: [facility];  Service: Thoracic;  Laterality: N/A;   VIDEO BRONCHOSCOPY WITH ENDOBRONCHIAL ULTRASOUND N/A 05/24/2024   Procedure: BRONCHOSCOPY, WITH EBUS;  Surgeon: Parris Manna, MD;  Location: [facility];  Service: Thoracic;  Laterality: N/A;    FAMILY HISTORY: Family History  Problem Relation Age of Onset   Ovarian cancer Mother    Cancer Mother    Stroke Father        CVA   Breast cancer Daughter 83   Breast cancer Maternal Aunt 60   Breast cancer Maternal Aunt 90   Prostate cancer Neg Hx    Bladder Cancer Neg Hx    Kidney cancer Neg Hx     ADVANCED DIRECTIVES (Y/N):  N  HEALTH MAINTENANCE: Social History   Tobacco Use   Smoking status: Never   Smokeless tobacco: Never  Vaping Use   Vaping status: Never Used  Substance Use Topics   Alcohol use: No   Drug use: No     Colonoscopy:  PAP:  Bone density:  Lipid panel:  Allergies  Allergen Reactions   Chlorhexidine  Hives     SAGE wipes caused severe rash   Cyclobenzaprine Hcl     Doesn't remember reaction   Etodolac     Doesn't remember reaction   Lisinopril     Coughing up blood   Meloxicam     Doesn't remember reaction   Metaxalone     Doesn't remember reaction   Neomycin-Bacitracin Zn-Polymyx     Skin irritation   Nifedipine Swelling   Nitrofurantoin     Doesn't remember reaction   Sulfamethoxazole-Trimethoprim Hives   Sulfonamide Derivatives Hives   Tizanidine     Liver problems   Vasotec [Enalapril Maleate]     Doesn't remember reaction   Ibuprofen Rash   Tape Rash    Current Outpatient Medications  Medication Sig Dispense Refill   acetaminophen  (TYLENOL ) 500 MG tablet Take 1,000 mg by mouth as needed for mild pain (pain score 1-3).     albuterol  (PROVENTIL ) (2.5 MG/3ML) 0.083% nebulizer solution  Inhale 3 mLs into the lungs every 6 (six) hours as needed for wheezing or shortness of breath.     apixaban  (ELIQUIS ) 5 MG TABS tablet Take 1 tablet (5 mg total) by mouth 2 (two) times daily.     aspirin  EC 81 MG tablet Take 1 tablet (81 mg total) by mouth daily. Swallow whole.     atorvastatin  (LIPITOR) 10 MG tablet Take 1 tablet (10 mg total) by mouth daily. 30 tablet 11   bisoprolol  (ZEBETA ) 10 MG tablet Take 1 tablet (10 mg total) by mouth daily.     cetirizine (ZYRTEC) 10 MG tablet Take 10 mg by mouth as needed.     Cholecalciferol  (VITAMIN D ) 50 MCG (2000 UT) tablet Take 4,000 Units by mouth daily.      estradiol  (ESTRACE  VAGINAL) 0.1 MG/GM vaginal cream Apply 0.5mg  (pea-sized amount)  just inside the vaginal introitus with a finger-tip on Monday, Wednesday and Friday nights. 30 g 12   fluticasone  (FLONASE ) 50 MCG/ACT nasal spray Place 2 sprays into both nostrils as needed for allergies or rhinitis.     furosemide  (LASIX ) 20 MG tablet Take 20 mg by mouth daily.     guaiFENesin  (ROBITUSSIN) 100 MG/5ML liquid Take 5 mLs by mouth every 4 (four) hours as needed for cough or to loosen phlegm.      hydrALAZINE  (APRESOLINE ) 50 MG tablet Take 2 tablets (100 mg total) by mouth in the morning and at bedtime. TAKE 2 TABLETS BY MOUTH EVERY MORNING TAKE ONE TABLET MID-DAY AND TAKE ONE TABLET BEFORE BED     lidocaine  4 % Place 1 patch onto the skin daily. REMOVE & DISCARD PATCH WITHIN 12 HOURS OR AS DIRECTED BY MD     methocarbamol  (ROBAXIN ) 500 MG tablet Take 1 tablet (500 mg total) by mouth every 6 (six) hours as needed for muscle spasms. 30 tablet 0   oxyCODONE -acetaminophen  (PERCOCET/ROXICET) 5-325 MG tablet Take 1-2 tablets by mouth every 6 (six) hours as needed for moderate pain (pain score 4-6) or severe pain (pain score 7-10). 12 tablet 0   rOPINIRole  (REQUIP ) 0.25 MG tablet Take 0.5 mg by mouth 3 (three) times daily.     No current facility-administered medications for this visit.    OBJECTIVE: There were no vitals filed for this visit.    There is no height or weight on file to calculate BMI.    ECOG FS:3 - Symptomatic, >50% confined to bed  General: Well-developed, well-nourished, no acute distress. HEENT: Normocephalic. Neuro: Alert, answering all questions appropriately. Cranial nerves grossly intact. Psych: Normal affect.  LAB RESULTS:  Lab Results  Component Value Date   NA 137 06/17/2024   K 3.9 06/17/2024   CL 98 06/17/2024   CO2 30 06/17/2024   GLUCOSE 117 (H) 06/17/2024   BUN 32 (H) 06/17/2024   CREATININE 1.26 (H) 06/17/2024   CALCIUM  8.9 06/17/2024   PROT 6.8 05/10/2024   ALBUMIN 3.8 05/10/2024   AST 28 05/10/2024   ALT 22 05/10/2024   ALKPHOS 77 05/10/2024   BILITOT 1.0 05/10/2024   GFRNONAA 42 (L) 06/17/2024   GFRAA 60 (L) 04/20/2020    Lab Results  Component Value Date   WBC 8.3 06/17/2024   NEUTROABS 7.7 05/10/2024   HGB 8.4 (L) 06/17/2024   HCT 26.9 (L) 06/17/2024   MCV 93.4 06/17/2024   PLT 183 06/17/2024     STUDIES: MR BRAIN W WO CONTRAST Result Date: 06/16/2024 EXAM: MRI BRAIN WITH AND WITHOUT CONTRAST 06/16/2024 06:30:00 PM  TECHNIQUE:  Multiplanar multisequence MRI of the head/brain was performed with and without the administration of intravenous contrast. COMPARISON: 06/07/2023 CLINICAL HISTORY: Cancer staging. Lung CA. Eval for mets. FINDINGS: BRAIN AND VENTRICLES: No acute infarct. No acute intracranial hemorrhage. No mass effect or midline shift. No hydrocephalus. The sella is unremarkable. Normal flow voids. No mass or abnormal enhancement. Multifocal hyperintense T2-weighted signal within the cerebral white matter, most commonly due to chronic small vessel disease. Generalized volume loss. 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 ORBITS: No acute abnormality. SINUSES: No acute abnormality. BONES AND SOFT TISSUES: Normal bone marrow signal and enhancement. No acute soft tissue abnormality. IMPRESSION: 1. No acute intracranial abnormality. 2. Multifocal hyperintense T2-weighted signal within the cerebral white matter, most commonly due to chronic small vessel disease. 3. Generalized volume loss. 4. 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 Electronically signed by: Franky Stanford MD 06/16/2024 08:35 PM EDT RP Workstation: [id number]   DG Chest Port 1 View Result Date: 06/14/2024 CLINICAL DATA:  Cough and breathing difficulty EXAM: PORTABLE CHEST 1 VIEW COMPARISON:  Chest radiograph dated 06/10/2024 FINDINGS: Low lung volumes with bronchovascular crowding. Increased patchy bibasilar opacities and mild interstitial opacities. Similar trace bilateral pleural effusions. No pneumothorax. Similar mildly enlarged cardiomediastinal silhouette. Cervical spinal fixation hardware appears intact. IMPRESSION: 1. Increased patchy bibasilar opacities and mild interstitial opacities, which may represent pulmonary edema or multifocal infection. 2. Similar trace bilateral pleural effusions. Electronically Signed   By: Limin  Xu M.D.   On: 06/14/2024 13:41   DG HIP UNILAT WITH PELVIS 2-3 VIEWS RIGHT Result Date: 06/14/2024 CLINICAL  DATA:  [id number] Hip pain [id number] EXAM: DG HIP (WITH OR WITHOUT PELVIS) 2-3V RIGHT COMPARISON:  March 09, 2023 FINDINGS: No evidence of pelvic fracture or diastasis.No acute hip fracture or dislocation.Moderate joint space loss of the left hip. Degenerative disc disease of the visualized lumbar spine.Soft tissues are unremarkable. IMPRESSION: 1. No acute fracture, pelvic bone diastasis, or dislocation. 2. Moderate osteoarthritis of the left hip. Electronically Signed   By: Rogelia Myers M.D.   On: 06/14/2024 13:20   ECHOCARDIOGRAM COMPLETE Result Date: 06/11/2024    ECHOCARDIOGRAM REPORT   Patient Name:   TEKELA GARGUILO Date of Exam: 06/11/2024 Medical Rec #:  [id number]        Height:       65.0 in Accession #:    [id number]       Weight:       210.9 lb Date of Birth:  May 03, 1936       BSA:          2.023 m Patient Age:    87 years         BP:           113/66 mmHg Patient Gender: F                HR:           91 bpm. Exam Location:  [facility] Procedure: 2D Echo, Cardiac Doppler and Color Doppler (Both Spectral and Color            Flow Doppler were utilized during procedure). Indications:     pulmonary embolus I26.09  History:         Patient has prior history of Echocardiogram examinations, most                  recent 12/26/2020. Previous Myocardial Infarction,  Signs/Symptoms:Dyspnea; Risk Factors:Hypertension.  Sonographer:     Christopher Furnace Referring Phys:  [id number] JAN A MANSY Diagnosing Phys: Lonni Hanson MD IMPRESSIONS  1. Left ventricular ejection fraction, by estimation, is 65 to 70%. The left ventricle has normal function. The left ventricle has no regional wall motion abnormalities. There is moderate left ventricular hypertrophy. Left ventricular diastolic parameters are indeterminate. There is the interventricular septum is flattened in diastole ('D' shaped left ventricle), consistent with right ventricular volume overload.  2. Pulmonary artery pressure is mildly elevated (RVSP 35 mmHg  plus central venous/right atrial pressure). Right ventricular systolic function is moderately reduced. The right ventricular size is moderately enlarged.  3. The mitral valve is degenerative. Trivial mitral valve regurgitation. No evidence of mitral stenosis.  4. The aortic valve is tricuspid. Aortic valve regurgitation is not visualized. No aortic stenosis is present. FINDINGS  Left Ventricle: Left ventricular ejection fraction, by estimation, is 65 to 70%. The left ventricle has normal function. The left ventricle has no regional wall motion abnormalities. The left ventricular internal cavity size was normal in size. There is  moderate left ventricular hypertrophy. The interventricular septum is flattened in diastole ('D' shaped left ventricle), consistent with right ventricular volume overload. Left ventricular diastolic parameters are indeterminate. Right Ventricle: Pulmonary artery pressure is mildly elevated (RVSP 35 mmHg plus central venous/right atrial pressure). The right ventricular size is moderately enlarged. No increase in right ventricular wall thickness. Right ventricular systolic function is moderately reduced. Left Atrium: Left atrial size was normal in size. Right Atrium: Right atrial size was normal in size. Pericardium: There is no evidence of pericardial effusion. Mitral Valve: The mitral valve is degenerative in appearance. There is mild thickening of the mitral valve leaflet(s). Mild mitral annular calcification. Trivial mitral valve regurgitation. No evidence of mitral valve stenosis. Tricuspid Valve: The tricuspid valve is normal in structure. Tricuspid valve regurgitation is mild. Aortic Valve: The aortic valve is tricuspid. Aortic valve regurgitation is not visualized. No aortic stenosis is present. Aortic valve mean gradient measures 3.0 mmHg. Aortic valve peak gradient measures 5.2 mmHg. Aortic valve area, by VTI measures 2.53 cm. Pulmonic Valve: The pulmonic valve was not well  visualized. Pulmonic valve regurgitation is not visualized. No evidence of pulmonic stenosis. Aorta: The aortic root is normal in size and structure. Pulmonary Artery: The pulmonary artery is not well seen. Venous: The inferior vena cava was not well visualized. IAS/Shunts: The interatrial septum was not well visualized.  LEFT VENTRICLE PLAX 2D LVIDd:         3.30 cm   Diastology LVIDs:         2.20 cm   LV e' medial:    4.79 cm/s LV PW:         1.20 cm   LV E/e' medial:  15.6 LV IVS:        1.38 cm   LV e' lateral:   11.50 cm/s LVOT diam:     2.00 cm   LV E/e' lateral: 6.5 LV SV:         44 LV SV Index:   22 LVOT Area:     3.14 cm  RIGHT VENTRICLE RV Basal diam:  4.00 cm RV Mid diam:    4.00 cm LEFT ATRIUM             Index        RIGHT ATRIUM           Index LA diam:  3.00 cm 1.48 cm/m   RA Area:     17.90 cm LA Vol (A2C):   42.0 ml 20.76 ml/m  RA Volume:   51.30 ml  25.36 ml/m LA Vol (A4C):   18.7 ml 9.24 ml/m LA Biplane Vol: 29.2 ml 14.43 ml/m  AORTIC VALVE AV Area (Vmax):    2.51 cm AV Area (Vmean):   2.20 cm AV Area (VTI):     2.53 cm AV Vmax:           114.00 cm/s AV Vmean:          88.200 cm/s AV VTI:            0.174 m AV Peak Grad:      5.2 mmHg AV Mean Grad:      3.0 mmHg LVOT Vmax:         91.00 cm/s LVOT Vmean:        61.700 cm/s LVOT VTI:          0.140 m LVOT/AV VTI ratio: 0.80  AORTA Ao Root diam: 3.20 cm MITRAL VALVE                TRICUSPID VALVE MV Area (PHT): 5.97 cm     TR Peak grad:   34.8 mmHg MV Decel Time: 127 msec     TR Vmax:        295.00 cm/s MV E velocity: 74.90 cm/s MV A velocity: 125.00 cm/s  SHUNTS MV E/A ratio:  0.60         Systemic VTI:  0.14 m                             Systemic Diam: 2.00 cm Lonni Hanson MD Electronically signed by Lonni Hanson MD Signature Date/Time: 06/11/2024/3:14:06 PM    Final    PERIPHERAL VASCULAR CATHETERIZATION Result Date: 06/11/2024 See surgical note for result.  US  Venous Img Lower Bilateral (DVT) Result Date:  06/11/2024 CLINICAL DATA:  Patient with pulmonary embolism.  Evaluate for DVT. EXAM: BILATERAL LOWER EXTREMITY VENOUS DOPPLER ULTRASOUND TECHNIQUE: Gray-scale sonography with graded compression, as well as color Doppler and duplex ultrasound were performed to evaluate the lower extremity deep venous systems from the level of the common femoral vein and including the common femoral, femoral, profunda femoral, popliteal and calf veins including the posterior tibial, peroneal and gastrocnemius veins when visible. The superficial great saphenous vein was also interrogated. Spectral Doppler was utilized to evaluate flow at rest and with distal augmentation maneuvers in the common femoral, femoral and popliteal veins. COMPARISON:  None Available. FINDINGS: RIGHT LOWER EXTREMITY Common Femoral Vein: No evidence of thrombus. Normal compressibility, respiratory phasicity and response to augmentation. Saphenofemoral Junction: No evidence of thrombus. Normal compressibility and flow on color Doppler imaging. Profunda Femoral Vein: No evidence of thrombus. Normal compressibility and flow on color Doppler imaging. Femoral Vein: Examination is positive for nonocclusive thrombus within the proximal right femoral vein. Popliteal Vein: No evidence of thrombus. Normal compressibility, respiratory phasicity and response to augmentation. Calf Veins: No evidence of thrombus. Normal compressibility and flow on color Doppler imaging. Superficial Great Saphenous Vein: No evidence of thrombus. Normal compressibility. Venous Reflux:  None. Other Findings:  None. LEFT LOWER EXTREMITY Common Femoral Vein: No evidence of thrombus. Normal compressibility, respiratory phasicity and response to augmentation. Saphenofemoral Junction: No evidence of thrombus. Normal compressibility and flow on color Doppler imaging. Profunda Femoral Vein: No evidence of thrombus. Normal compressibility and  flow on color Doppler imaging. Femoral Vein: No evidence of  thrombus. Normal compressibility, respiratory phasicity and response to augmentation. Popliteal Vein: No evidence of thrombus. Normal compressibility, respiratory phasicity and response to augmentation. Calf Veins: No evidence of thrombus. Normal compressibility and flow on color Doppler imaging. Superficial Great Saphenous Vein: No evidence of thrombus. Normal compressibility. Venous Reflux:  None. Other Findings:  None. IMPRESSION: 1. Examination is positive for nonocclusive thrombus within the proximal right femoral vein. 2. No evidence for left lower extremity deep venous thrombosis. Electronically Signed   By: Waddell Calk M.D.   On: 06/11/2024 06:39   CT Angio Chest PE W and/or Wo Contrast Result Date: 06/11/2024 CLINICAL DATA:  Shortness of breath and left-sided chest pain, known history of lung carcinoma EXAM: CT ANGIOGRAPHY CHEST WITH CONTRAST TECHNIQUE: Multidetector CT imaging of the chest was performed using the standard protocol during bolus administration of intravenous contrast. Multiplanar CT image reconstructions and MIPs were obtained to evaluate the vascular anatomy. RADIATION DOSE REDUCTION: This exam was performed according to the departmental dose-optimization program which includes automated exposure control, adjustment of the mA and/or kV according to patient size and/or use of iterative reconstruction technique. CONTRAST:  75mL OMNIPAQUE  IOHEXOL  350 MG/ML SOLN COMPARISON:  05/24/2024 FINDINGS: Cardiovascular: Atherosclerotic calcifications of the thoracic aorta are noted. No aneurysmal dilatation or dissection is seen. The pulmonary artery shows a normal branching pattern bilaterally. Extensive pulmonary emboli are noted right greater than left. There are changes consistent with right heart strain with an RV/LV ratio of 1.5. Mediastinum/Nodes: Thoracic inlet is within normal limits. No hilar or mediastinal adenopathy is noted. The esophagus as visualized is within normal limits.  Sliding-type hiatal hernia is seen. Lungs/Pleura: Small left effusion is noted. Scattered nodular densities are seen similar to that noted on prior exam consistent with metastatic disease. The largest index lesion in the left lower lobe measures approximately 17 mm in greatest dimension stable from the prior study. Similar findings are noted within the right lung although a large pleural effusion is noted increased when compared with the prior exam. Index lesion in the right lower lobe measures 17 mm also stable from the prior exam. Upper Abdomen: Visualized upper abdomen shows stable cyst in the left lobe of the liver. Musculoskeletal: Degenerative changes of the thoracic spine are noted. No compression deformity is noted. Review of the MIP images confirms the above findings. IMPRESSION: Extensive bilateral pulmonary emboli with evidence of right heart strain. The RV/LV ratio measures 1.5. Scattered pulmonary nodules again identified consistent with metastatic disease. The overall appearance is similar to that seen on the recent exam. Aortic Atherosclerosis (ICD10-I70.0). Critical Value/emergent results were called by telephone at the time of interpretation on 06/11/2024 at 1:24 am to Dr. KEVIN PADUCHOWSKI , who verbally acknowledged these results. Electronically Signed   By: Oneil Devonshire M.D.   On: 06/11/2024 01:26   DG Chest Port 1 View Result Date: 06/10/2024 CLINICAL DATA:  Shortness of breath EXAM: PORTABLE CHEST 1 VIEW COMPARISON:  Chest x-ray 05/24/2024.  CT of the chest 05/24/2024. FINDINGS: Heart is enlarged. There are small bilateral pleural effusions. There scattered bilateral patchy airspace opacities in both lungs increasing in the left upper lobe. Scattered nodular densities are grossly unchanged. No pneumothorax or acute fracture identified. IMPRESSION: 1. Scattered bilateral patchy airspace opacities in both lungs increasing in the left upper lobe. Findings may represent edema or infection. 2.  Small bilateral pleural effusions. 3. Stable scattered nodular densities. 4. Cardiomegaly. Electronically Signed  By: Greig Pique M.D.   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  PD-L1 80%.  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  PET scan results from May 21, 2024 confirmed stage of disease with bilateral lung, liver, and multiple bony metastasis.  Brain MRI on June 16, 2024 not reveal any metastatic disease.  Patient once again declined chemotherapy, but given her PD-L1 positivity would consider single agent Keytruda every 3 weeks once her performance status improves.  NGS testing did not reveal any other actionable mutations.  She reports she is being discharged from rehab in approximately 1 week.  Patient will return to clinic after her discharge from rehab for further evaluation and to assess whether her performance status is improved enough to proceed with Keytruda.  Bony disease: Patient will benefit from Zometa along with her Keytruda.  Pain: Patient does not complain of this today.  Unrelated to malignancy.  MRI on May 10, 2024 revealed diffuse degenerative disc disease most severe at L5-S1.  Continue Robaxin  and Percocet as needed. Decreased performance status: Patient currently in rehab.  Monitor.  I provided 30 minutes of face-to-face video visit time during this encounter which included chart review, counseling, and coordination of care as documented above.    Patient expressed understanding and was in agreement with this plan. She also understands that She can call clinic at any time with any questions, concerns, or complaints.    Cancer Staging  Adenocarcinoma, lung, unspecified laterality [facility]) Staging form: Lung, AJCC V9 - Clinical stage from 05/30/2024:  Stage IVB (cT1b, cN3, cM1c2) - Signed by Jacobo Evalene PARAS, MD on 05/30/2024   Evalene PARAS Jacobo, MD   06/28/2024 2:35 PM

## 2024-06-30 ENCOUNTER — Emergency Department

## 2024-06-30 ENCOUNTER — Other Ambulatory Visit: Payer: Self-pay

## 2024-06-30 ENCOUNTER — Inpatient Hospital Stay
Admission: EM | Admit: 2024-06-30 | Discharge: 2024-07-29 | DRG: 871 | Disposition: E | Source: Skilled Nursing Facility | Attending: Family Medicine | Admitting: Family Medicine

## 2024-06-30 DIAGNOSIS — Z9889 Other specified postprocedural states: Secondary | ICD-10-CM

## 2024-06-30 DIAGNOSIS — J9621 Acute and chronic respiratory failure with hypoxia: Secondary | ICD-10-CM | POA: Diagnosis present

## 2024-06-30 DIAGNOSIS — F02A4 Dementia in other diseases classified elsewhere, mild, with anxiety: Secondary | ICD-10-CM | POA: Diagnosis not present

## 2024-06-30 DIAGNOSIS — Z66 Do not resuscitate: Secondary | ICD-10-CM | POA: Diagnosis present

## 2024-06-30 DIAGNOSIS — K5900 Constipation, unspecified: Secondary | ICD-10-CM | POA: Diagnosis present

## 2024-06-30 DIAGNOSIS — Z7901 Long term (current) use of anticoagulants: Secondary | ICD-10-CM

## 2024-06-30 DIAGNOSIS — C7800 Secondary malignant neoplasm of unspecified lung: Secondary | ICD-10-CM | POA: Diagnosis not present

## 2024-06-30 DIAGNOSIS — J90 Pleural effusion, not elsewhere classified: Secondary | ICD-10-CM | POA: Diagnosis present

## 2024-06-30 DIAGNOSIS — J189 Pneumonia, unspecified organism: Secondary | ICD-10-CM | POA: Diagnosis present

## 2024-06-30 DIAGNOSIS — Z888 Allergy status to other drugs, medicaments and biological substances status: Secondary | ICD-10-CM

## 2024-06-30 DIAGNOSIS — Z87442 Personal history of urinary calculi: Secondary | ICD-10-CM

## 2024-06-30 DIAGNOSIS — R6520 Severe sepsis without septic shock: Secondary | ICD-10-CM | POA: Diagnosis present

## 2024-06-30 DIAGNOSIS — I2489 Other forms of acute ischemic heart disease: Secondary | ICD-10-CM | POA: Diagnosis present

## 2024-06-30 DIAGNOSIS — J188 Other pneumonia, unspecified organism: Secondary | ICD-10-CM | POA: Diagnosis not present

## 2024-06-30 DIAGNOSIS — G2581 Restless legs syndrome: Secondary | ICD-10-CM | POA: Diagnosis not present

## 2024-06-30 DIAGNOSIS — C787 Secondary malignant neoplasm of liver and intrahepatic bile duct: Secondary | ICD-10-CM | POA: Diagnosis present

## 2024-06-30 DIAGNOSIS — Z86711 Personal history of pulmonary embolism: Secondary | ICD-10-CM

## 2024-06-30 DIAGNOSIS — I10 Essential (primary) hypertension: Secondary | ICD-10-CM | POA: Diagnosis present

## 2024-06-30 DIAGNOSIS — C3490 Malignant neoplasm of unspecified part of unspecified bronchus or lung: Secondary | ICD-10-CM | POA: Diagnosis present

## 2024-06-30 DIAGNOSIS — Z823 Family history of stroke: Secondary | ICD-10-CM

## 2024-06-30 DIAGNOSIS — M19041 Primary osteoarthritis, right hand: Secondary | ICD-10-CM | POA: Diagnosis present

## 2024-06-30 DIAGNOSIS — Z6834 Body mass index (BMI) 34.0-34.9, adult: Secondary | ICD-10-CM | POA: Diagnosis not present

## 2024-06-30 DIAGNOSIS — M19042 Primary osteoarthritis, left hand: Secondary | ICD-10-CM | POA: Diagnosis present

## 2024-06-30 DIAGNOSIS — Z886 Allergy status to analgesic agent status: Secondary | ICD-10-CM

## 2024-06-30 DIAGNOSIS — E785 Hyperlipidemia, unspecified: Secondary | ICD-10-CM | POA: Diagnosis not present

## 2024-06-30 DIAGNOSIS — R0602 Shortness of breath: Secondary | ICD-10-CM

## 2024-06-30 DIAGNOSIS — N183 Chronic kidney disease, stage 3 unspecified: Secondary | ICD-10-CM | POA: Diagnosis not present

## 2024-06-30 DIAGNOSIS — J9601 Acute respiratory failure with hypoxia: Secondary | ICD-10-CM | POA: Diagnosis not present

## 2024-06-30 DIAGNOSIS — Z9841 Cataract extraction status, right eye: Secondary | ICD-10-CM

## 2024-06-30 DIAGNOSIS — Z7982 Long term (current) use of aspirin: Secondary | ICD-10-CM

## 2024-06-30 DIAGNOSIS — Z9842 Cataract extraction status, left eye: Secondary | ICD-10-CM

## 2024-06-30 DIAGNOSIS — I252 Old myocardial infarction: Secondary | ICD-10-CM

## 2024-06-30 DIAGNOSIS — J9622 Acute and chronic respiratory failure with hypercapnia: Secondary | ICD-10-CM | POA: Diagnosis present

## 2024-06-30 DIAGNOSIS — Z809 Family history of malignant neoplasm, unspecified: Secondary | ICD-10-CM

## 2024-06-30 DIAGNOSIS — I2510 Atherosclerotic heart disease of native coronary artery without angina pectoris: Secondary | ICD-10-CM | POA: Diagnosis not present

## 2024-06-30 DIAGNOSIS — C7951 Secondary malignant neoplasm of bone: Secondary | ICD-10-CM | POA: Diagnosis present

## 2024-06-30 DIAGNOSIS — C7801 Secondary malignant neoplasm of right lung: Secondary | ICD-10-CM | POA: Diagnosis not present

## 2024-06-30 DIAGNOSIS — I129 Hypertensive chronic kidney disease with stage 1 through stage 4 chronic kidney disease, or unspecified chronic kidney disease: Secondary | ICD-10-CM | POA: Diagnosis present

## 2024-06-30 DIAGNOSIS — I255 Ischemic cardiomyopathy: Secondary | ICD-10-CM | POA: Diagnosis present

## 2024-06-30 DIAGNOSIS — J9600 Acute respiratory failure, unspecified whether with hypoxia or hypercapnia: Secondary | ICD-10-CM | POA: Diagnosis not present

## 2024-06-30 DIAGNOSIS — M1712 Unilateral primary osteoarthritis, left knee: Secondary | ICD-10-CM | POA: Diagnosis present

## 2024-06-30 DIAGNOSIS — Z85828 Personal history of other malignant neoplasm of skin: Secondary | ICD-10-CM

## 2024-06-30 DIAGNOSIS — I447 Left bundle-branch block, unspecified: Secondary | ICD-10-CM | POA: Diagnosis present

## 2024-06-30 DIAGNOSIS — F0390 Unspecified dementia without behavioral disturbance: Secondary | ICD-10-CM | POA: Diagnosis present

## 2024-06-30 DIAGNOSIS — M170 Bilateral primary osteoarthritis of knee: Secondary | ICD-10-CM | POA: Diagnosis present

## 2024-06-30 DIAGNOSIS — F01A4 Vascular dementia, mild, with anxiety: Secondary | ICD-10-CM | POA: Diagnosis not present

## 2024-06-30 DIAGNOSIS — Z86718 Personal history of other venous thrombosis and embolism: Secondary | ICD-10-CM

## 2024-06-30 DIAGNOSIS — R069 Unspecified abnormalities of breathing: Secondary | ICD-10-CM | POA: Diagnosis not present

## 2024-06-30 DIAGNOSIS — Z803 Family history of malignant neoplasm of breast: Secondary | ICD-10-CM

## 2024-06-30 DIAGNOSIS — Z882 Allergy status to sulfonamides status: Secondary | ICD-10-CM

## 2024-06-30 DIAGNOSIS — Z9981 Dependence on supplemental oxygen: Secondary | ICD-10-CM

## 2024-06-30 DIAGNOSIS — Z8041 Family history of malignant neoplasm of ovary: Secondary | ICD-10-CM

## 2024-06-30 DIAGNOSIS — I2699 Other pulmonary embolism without acute cor pulmonale: Secondary | ICD-10-CM | POA: Diagnosis present

## 2024-06-30 DIAGNOSIS — R457 State of emotional shock and stress, unspecified: Secondary | ICD-10-CM | POA: Diagnosis not present

## 2024-06-30 DIAGNOSIS — Z48813 Encounter for surgical aftercare following surgery on the respiratory system: Secondary | ICD-10-CM | POA: Diagnosis not present

## 2024-06-30 DIAGNOSIS — Z961 Presence of intraocular lens: Secondary | ICD-10-CM | POA: Diagnosis present

## 2024-06-30 DIAGNOSIS — Z96651 Presence of right artificial knee joint: Secondary | ICD-10-CM | POA: Diagnosis present

## 2024-06-30 DIAGNOSIS — Z1152 Encounter for screening for COVID-19: Secondary | ICD-10-CM

## 2024-06-30 DIAGNOSIS — Z881 Allergy status to other antibiotic agents status: Secondary | ICD-10-CM

## 2024-06-30 DIAGNOSIS — N1831 Chronic kidney disease, stage 3a: Secondary | ICD-10-CM | POA: Diagnosis present

## 2024-06-30 DIAGNOSIS — E66812 Obesity, class 2: Secondary | ICD-10-CM | POA: Diagnosis present

## 2024-06-30 DIAGNOSIS — Z90710 Acquired absence of both cervix and uterus: Secondary | ICD-10-CM

## 2024-06-30 DIAGNOSIS — J9811 Atelectasis: Secondary | ICD-10-CM | POA: Diagnosis not present

## 2024-06-30 DIAGNOSIS — Z515 Encounter for palliative care: Secondary | ICD-10-CM

## 2024-06-30 DIAGNOSIS — R918 Other nonspecific abnormal finding of lung field: Secondary | ICD-10-CM | POA: Diagnosis not present

## 2024-06-30 DIAGNOSIS — A419 Sepsis, unspecified organism: Secondary | ICD-10-CM | POA: Diagnosis not present

## 2024-06-30 DIAGNOSIS — C7802 Secondary malignant neoplasm of left lung: Secondary | ICD-10-CM | POA: Diagnosis not present

## 2024-06-30 DIAGNOSIS — G309 Alzheimer's disease, unspecified: Secondary | ICD-10-CM | POA: Diagnosis not present

## 2024-06-30 DIAGNOSIS — C801 Malignant (primary) neoplasm, unspecified: Secondary | ICD-10-CM | POA: Diagnosis not present

## 2024-06-30 DIAGNOSIS — Z86007 Personal history of in-situ neoplasm of skin: Secondary | ICD-10-CM

## 2024-06-30 DIAGNOSIS — Z79899 Other long term (current) drug therapy: Secondary | ICD-10-CM

## 2024-06-30 LAB — COMPREHENSIVE METABOLIC PANEL WITH GFR
ALT: 20 U/L (ref 0–44)
AST: 33 U/L (ref 15–41)
Albumin: 2.9 g/dL — ABNORMAL LOW (ref 3.5–5.0)
Alkaline Phosphatase: 144 U/L — ABNORMAL HIGH (ref 38–126)
Anion gap: 13 (ref 5–15)
BUN: 28 mg/dL — ABNORMAL HIGH (ref 8–23)
CO2: 26 mmol/L (ref 22–32)
Calcium: 9.1 mg/dL (ref 8.9–10.3)
Chloride: 98 mmol/L (ref 98–111)
Creatinine, Ser: 1.39 mg/dL — ABNORMAL HIGH (ref 0.44–1.00)
GFR, Estimated: 37 mL/min — ABNORMAL LOW (ref >60)
Glucose, Bld: 268 mg/dL — ABNORMAL HIGH (ref 70–99)
Potassium: 4.1 mmol/L (ref 3.5–5.1)
Sodium: 137 mmol/L (ref 135–145)
Total Bilirubin: 0.7 mg/dL (ref 0.0–1.2)
Total Protein: 6.1 g/dL — ABNORMAL LOW (ref 6.5–8.1)

## 2024-06-30 LAB — CBC WITH DIFFERENTIAL/PLATELET
Abs Immature Granulocytes: 0.27 K/uL — ABNORMAL HIGH (ref 0.00–0.07)
Basophils Absolute: 0.1 K/uL (ref 0.0–0.1)
Basophils Relative: 1 %
Eosinophils Absolute: 0.4 K/uL (ref 0.0–0.5)
Eosinophils Relative: 2 %
HCT: 34.1 % — ABNORMAL LOW (ref 36.0–46.0)
Hemoglobin: 10.7 g/dL — ABNORMAL LOW (ref 12.0–15.0)
Immature Granulocytes: 1 %
Lymphocytes Relative: 21 %
Lymphs Abs: 4.1 K/uL — ABNORMAL HIGH (ref 0.7–4.0)
MCH: 29.8 pg (ref 26.0–34.0)
MCHC: 31.4 g/dL (ref 30.0–36.0)
MCV: 95 fL (ref 80.0–100.0)
Monocytes Absolute: 1.7 K/uL — ABNORMAL HIGH (ref 0.1–1.0)
Monocytes Relative: 9 %
Neutro Abs: 12.7 K/uL — ABNORMAL HIGH (ref 1.7–7.7)
Neutrophils Relative %: 66 %
Platelets: 309 K/uL (ref 150–400)
RBC: 3.59 MIL/uL — ABNORMAL LOW (ref 3.87–5.11)
RDW: 14.7 % (ref 11.5–15.5)
WBC: 19.3 K/uL — ABNORMAL HIGH (ref 4.0–10.5)
nRBC: 0 % (ref 0.0–0.2)

## 2024-06-30 LAB — BLOOD GAS, VENOUS
Acid-Base Excess: 0.9 mmol/L (ref 0.0–2.0)
Acid-base deficit: 1 mmol/L (ref 0.0–2.0)
Bicarbonate: 27.6 mmol/L (ref 20.0–28.0)
Bicarbonate: 29.4 mmol/L — ABNORMAL HIGH (ref 20.0–28.0)
O2 Saturation: 100 %
O2 Saturation: 73 %
Patient temperature: 37
Patient temperature: 37
pCO2, Ven: 66 mmHg — ABNORMAL HIGH (ref 44–60)
pCO2, Ven: 64 mmHg — ABNORMAL HIGH (ref 44–60)
pH, Ven: 7.23 — ABNORMAL LOW (ref 7.25–7.43)
pH, Ven: 7.27 (ref 7.25–7.43)
pO2, Ven: 189 mmHg — ABNORMAL HIGH (ref 32–45)
pO2, Ven: 47 mmHg — ABNORMAL HIGH (ref 32–45)

## 2024-06-30 LAB — TROPONIN I (HIGH SENSITIVITY)
Troponin I (High Sensitivity): 71 ng/L — ABNORMAL HIGH (ref <18)
Troponin I (High Sensitivity): 198 ng/L (ref <18)

## 2024-06-30 LAB — RESP PANEL BY RT-PCR (RSV, FLU A&B, COVID)  RVPGX2
Influenza A by PCR: NEGATIVE
Influenza B by PCR: NEGATIVE
Resp Syncytial Virus by PCR: NEGATIVE
SARS Coronavirus 2 by RT PCR: NEGATIVE

## 2024-06-30 LAB — LACTIC ACID, PLASMA: Lactic Acid, Venous: 1.3 mmol/L (ref 0.5–1.9)

## 2024-06-30 MED ORDER — BISOPROLOL FUMARATE 5 MG PO TABS
10.0000 mg | ORAL_TABLET | Freq: Every day | ORAL | Status: DC
  Administered 2024-07-01 – 2024-07-08 (×9): 10 mg via ORAL
  Filled 2024-06-30 (×9): qty 2

## 2024-06-30 MED ORDER — ACETAMINOPHEN 500 MG PO TABS
1000.0000 mg | ORAL_TABLET | Freq: Three times a day (TID) | ORAL | Status: DC | PRN
  Administered 2024-07-02: 1000 mg via ORAL
  Filled 2024-06-30 (×2): qty 2

## 2024-06-30 MED ORDER — GUAIFENESIN 100 MG/5ML PO LIQD
5.0000 mL | ORAL | Status: DC | PRN
  Administered 2024-07-04: 5 mL via ORAL
  Filled 2024-06-30: qty 10

## 2024-06-30 MED ORDER — OXYCODONE-ACETAMINOPHEN 5-325 MG PO TABS: 1.0000 | ORAL_TABLET | Freq: Four times a day (QID) | ORAL | Status: DC | PRN

## 2024-06-30 MED ORDER — ALBUTEROL SULFATE (2.5 MG/3ML) 0.083% IN NEBU
10.0000 mg/h | INHALATION_SOLUTION | Freq: Once | RESPIRATORY_TRACT | Status: AC
  Administered 2024-06-30: 10 mg/h via RESPIRATORY_TRACT
  Filled 2024-06-30: qty 12

## 2024-06-30 MED ORDER — LIDOCAINE 5 % EX PTCH
1.0000 | MEDICATED_PATCH | CUTANEOUS | Status: DC
  Administered 2024-07-01 – 2024-07-09 (×6): 1 via TRANSDERMAL
  Filled 2024-06-30 (×9): qty 1

## 2024-06-30 MED ORDER — LIDOCAINE 4 % EX PTCH: 1.0000 | MEDICATED_PATCH | CUTANEOUS | Status: DC

## 2024-06-30 MED ORDER — ROPINIROLE HCL 1 MG PO TABS
0.5000 mg | ORAL_TABLET | Freq: Three times a day (TID) | ORAL | Status: DC
  Administered 2024-07-01 – 2024-07-08 (×26): 0.5 mg via ORAL
  Filled 2024-06-30 (×5): qty 1
  Filled 2024-06-30: qty 2
  Filled 2024-06-30 (×6): qty 1
  Filled 2024-06-30: qty 2
  Filled 2024-06-30: qty 1
  Filled 2024-06-30: qty 2
  Filled 2024-06-30 (×4): qty 1
  Filled 2024-06-30: qty 2
  Filled 2024-06-30: qty 1
  Filled 2024-06-30 (×2): qty 2
  Filled 2024-06-30 (×2): qty 1

## 2024-06-30 MED ORDER — FUROSEMIDE 40 MG PO TABS: 20.0000 mg | ORAL_TABLET | Freq: Every day | ORAL | Status: DC

## 2024-06-30 MED ORDER — APIXABAN 5 MG PO TABS
5.0000 mg | ORAL_TABLET | Freq: Two times a day (BID) | ORAL | Status: DC
  Administered 2024-07-01 – 2024-07-08 (×18): 5 mg via ORAL
  Filled 2024-06-30 (×16): qty 1

## 2024-06-30 MED ORDER — ALBUTEROL SULFATE (2.5 MG/3ML) 0.083% IN NEBU
3.0000 mL | INHALATION_SOLUTION | Freq: Four times a day (QID) | RESPIRATORY_TRACT | Status: DC | PRN
  Administered 2024-07-01 – 2024-07-02 (×4): 3 mL via RESPIRATORY_TRACT
  Filled 2024-06-30 (×3): qty 3

## 2024-06-30 MED ORDER — METHOCARBAMOL 500 MG PO TABS
500.0000 mg | ORAL_TABLET | Freq: Four times a day (QID) | ORAL | Status: DC | PRN
  Administered 2024-07-01 – 2024-07-03 (×3): 500 mg via ORAL
  Filled 2024-06-30 (×3): qty 1

## 2024-06-30 MED ORDER — LORATADINE 10 MG PO TABS
10.0000 mg | ORAL_TABLET | Freq: Every day | ORAL | Status: DC
  Administered 2024-07-02 – 2024-07-08 (×9): 10 mg via ORAL
  Filled 2024-06-30 (×9): qty 1

## 2024-06-30 MED ORDER — ASPIRIN 81 MG PO TBEC
81.0000 mg | DELAYED_RELEASE_TABLET | Freq: Every day | ORAL | Status: DC
  Administered 2024-07-01 – 2024-07-08 (×8): 81 mg via ORAL
  Filled 2024-06-30 (×8): qty 1

## 2024-06-30 MED ORDER — SODIUM CHLORIDE 0.9 % IV SOLN
500.0000 mg | Freq: Once | INTRAVENOUS | Status: AC
  Administered 2024-06-30: 500 mg via INTRAVENOUS
  Filled 2024-06-30: qty 5

## 2024-06-30 MED ORDER — ATORVASTATIN CALCIUM 10 MG PO TABS
10.0000 mg | ORAL_TABLET | Freq: Every day | ORAL | Status: DC
  Administered 2024-07-02 – 2024-07-08 (×9): 10 mg via ORAL
  Filled 2024-06-30 (×10): qty 1

## 2024-06-30 MED ORDER — FLUTICASONE PROPIONATE 50 MCG/ACT NA SUSP: 2.0000 | NASAL | Status: DC | PRN

## 2024-06-30 MED ORDER — HYDRALAZINE HCL 50 MG PO TABS
100.0000 mg | ORAL_TABLET | Freq: Two times a day (BID) | ORAL | Status: DC
  Administered 2024-07-01 – 2024-07-08 (×17): 100 mg via ORAL
  Filled 2024-06-30 (×16): qty 2

## 2024-06-30 MED ORDER — SODIUM CHLORIDE 0.9 % IV SOLN
2.0000 g | Freq: Once | INTRAVENOUS | Status: AC
  Administered 2024-06-30: 2 g via INTRAVENOUS
  Filled 2024-06-30: qty 20

## 2024-06-30 MED ORDER — DM-GUAIFENESIN ER 30-600 MG PO TB12
1.0000 | ORAL_TABLET | Freq: Two times a day (BID) | ORAL | Status: DC
  Administered 2024-07-01 – 2024-07-08 (×17): 1 via ORAL
  Filled 2024-06-30 (×18): qty 1

## 2024-06-30 MED ORDER — SODIUM CHLORIDE 0.9 % IV SOLN
500.0000 mg | INTRAVENOUS | Status: DC
  Administered 2024-07-02 – 2024-07-04 (×4): 500 mg via INTRAVENOUS
  Filled 2024-06-30 (×5): qty 5

## 2024-06-30 MED ORDER — SODIUM CHLORIDE 0.9 % IV SOLN
2.0000 g | INTRAVENOUS | Status: DC
  Administered 2024-07-02 – 2024-07-07 (×7): 2 g via INTRAVENOUS
  Filled 2024-06-30 (×8): qty 20

## 2024-06-30 NOTE — ED Notes (Addendum)
 DO made aware pt requesting Per Fausto DO, okay to trial patient off of BiPAP. Pt placed on 2L via [place], home oxygen.

## 2024-06-30 NOTE — ED Triage Notes (Signed)
 BIB [facility] from [facility] for resp distress. pt was found to be 85% on 2 liters at facility. Pt placed on CPAP.  DNR  Pt got one breathing tratment at facility. Pt has had HX of resp failure and panic disorders. Pt ahd similar episode yesterday but was less. Pt has little air movement. Pt is on antibiotics for pneumonia and recent diagnosis of lung cancer and has received no treatment yet for same.   125 solu medrol  2 mag  22 L wrist  18 Rigth hand  285 BGL  97.1 AX temp ETCO2 70 HTN

## 2024-06-30 NOTE — ED Notes (Signed)
 Fausto DO made aware trop 198

## 2024-06-30 NOTE — ED Notes (Signed)
 2 sets of cultures sent down and full rainbow.

## 2024-06-30 NOTE — ED Provider Notes (Signed)
 [facility] Provider Note   Event Date/Time   First MD Initiated Contact with Patient 06/30/24 1848     (approximate) History  Respiratory Distress  HPI Julie Mcbride is a 88 y.o. female with a past medical history of hypertension, CKD, obesity, hyperlipidemia, CAD, and recent diagnosis of lung cancer who presents via EMS from [facility] for respiratory distress.  Patient found to be 85% on 2 L chronically and placed on CPAP.  Patient had a neb as well as an extra albuterol  breathing treatments as well as 125 Solu-Medrol , 2 g magnesium  prior to arrival with mild improvement in patient's respiratory status.  Patient also currently being treated for pneumonia.  Further history and review of systems are unable to be obtained at this time secondary to patient severity ROS: Unable to assess   Physical Exam  Triage Vital Signs: ED Triage Vitals [06/30/24 1846]  Encounter Vitals Group     BP (!) 155/104     Girls Systolic BP Percentile      Girls Diastolic BP Percentile      Boys Systolic BP Percentile      Boys Diastolic BP Percentile      Pulse Rate (!) 108     Resp 16     Temp 98 F (36.7 C)     Temp Source Axillary     SpO2 100 %     Weight      Height      Head Circumference      Peak Flow      Pain Score      Pain Loc      Pain Education      Exclude from Growth Chart    Most recent vital signs: Vitals:   06/30/24 2316 06/30/24 2330  BP:  (!) 102/54  Pulse:  74  Resp:  19  Temp: (!) 97.5 F (36.4 C)   SpO2:  96%   General: Awake, shaking head yes or no CV:  Good peripheral perfusion. Resp:  Increased effort.  Bilateral inspiratory and expiratory wheezing Abd:  No distention. Other:  Elderly overweight Caucasian female BiPAP in place in moderate respiratory distress ED Results / Procedures / Treatments  Labs (all labs ordered are listed, but only abnormal results are displayed) Labs Reviewed  COMPREHENSIVE METABOLIC PANEL WITH GFR -  Abnormal; Notable for the following components:      Result Value   Glucose, Bld 268 (*)    BUN 28 (*)    Creatinine, Ser 1.39 (*)    Total Protein 6.1 (*)    Albumin  2.9 (*)    Alkaline Phosphatase 144 (*)    GFR, Estimated 37 (*)    All other components within normal limits  CBC WITH DIFFERENTIAL/PLATELET - Abnormal; Notable for the following components:   WBC 19.3 (*)    RBC 3.59 (*)    Hemoglobin 10.7 (*)    HCT 34.1 (*)    Neutro Abs 12.7 (*)    Lymphs Abs 4.1 (*)    Monocytes Absolute 1.7 (*)    Abs Immature Granulocytes 0.27 (*)    All other components within normal limits  BLOOD GAS, VENOUS - Abnormal; Notable for the following components:   pH, Ven 7.23 (*)    pCO2, Ven 66 (*)    pO2, Ven 189 (*)    All other components within normal limits  BLOOD GAS, VENOUS - Abnormal; Notable for the following components:   pCO2, Ven 64 (*)  pO2, Ven 47 (*)    Bicarbonate 29.4 (*)    All other components within normal limits  TROPONIN I (HIGH SENSITIVITY) - Abnormal; Notable for the following components:   Troponin I (High Sensitivity) 71 (*)    All other components within normal limits  TROPONIN I (HIGH SENSITIVITY) - Abnormal; Notable for the following components:   Troponin I (High Sensitivity) 198 (*)    All other components within normal limits  RESP PANEL BY RT-PCR (RSV, FLU A&B, COVID)  RVPGX2  CULTURE, BLOOD (ROUTINE X 2)  CULTURE, BLOOD (ROUTINE X 2)  MRSA NEXT GEN BY PCR, NASAL  EXPECTORATED SPUTUM ASSESSMENT W GRAM STAIN, RFLX TO RESP C  LACTIC ACID, PLASMA  BRAIN NATRIURETIC PEPTIDE  HIV ANTIBODY (ROUTINE TESTING W REFLEX)  STREP PNEUMONIAE URINARY ANTIGEN  LEGIONELLA PNEUMOPHILA SEROGP 1 UR AG  CBC  BASIC METABOLIC PANEL WITH GFR  MAGNESIUM    EKG ED ECG REPORT I, Artist MARLA Kerns, the attending physician, personally viewed and interpreted this ECG. Date: 06/30/2024 EKG Time: 1847 Rate: 109 Rhythm: normal sinus rhythm QRS Axis: normal Intervals:  normal ST/T Wave abnormalities: normal Narrative Interpretation: no evidence of acute ischemia RADIOLOGY ED MD interpretation: One-view portable chest x-ray shows moderate right and small to moderate left pleural effusion that is increased since prior exam with associated bibasilar atelectasis.  There is increased bilateral multifocal interstitial opacities which could reflect edema or infection - All radiology independently interpreted and agree with radiology assessment Official radiology report(s): DG Chest Port 1 View Result Date: 06/30/2024 CLINICAL DATA:  Shortness of breath. EXAM: PORTABLE CHEST 1 VIEW COMPARISON:  06/14/2024. FINDINGS: The heart size and mediastinal contours are unchanged. Moderate right and small to moderate left pleural effusions, increased since the prior exam with associated bibasilar atelectasis. Increased bilateral multifocal interstitial opacities. No pneumothorax. Cervical fixation hardware. No acute osseous abnormality. IMPRESSION: 1. Moderate right and small to moderate left pleural effusions, increased since the prior exam with associated bibasilar atelectasis. 2. Increased bilateral multifocal interstitial opacities could reflect pulmonary edema or multifocal infection. Electronically Signed   By: Harrietta Sherry M.D.   On: 06/30/2024 19:34   PROCEDURES: Critical Care performed: Yes, see critical care procedure note(s) Procedures MEDICATIONS ORDERED IN ED: Medications  cefTRIAXone  (ROCEPHIN ) 2 g in sodium chloride  0.9 % 100 mL IVPB (has no administration in time range)  azithromycin  (ZITHROMAX ) 500 mg in sodium chloride  0.9 % 250 mL IVPB (has no administration in time range)  dextromethorphan -guaiFENesin  (MUCINEX  DM) 30-600 MG per 12 hr tablet 1 tablet (has no administration in time range)  guaiFENesin  (ROBITUSSIN) 100 MG/5ML liquid 5 mL (has no administration in time range)  acetaminophen  (TYLENOL ) tablet 1,000 mg (has no administration in time range)   aspirin  EC tablet 81 mg (has no administration in time range)  oxyCODONE -acetaminophen  (PERCOCET/ROXICET) 5-325 MG per tablet 1-2 tablet (has no administration in time range)  atorvastatin  (LIPITOR) tablet 10 mg (has no administration in time range)  bisoprolol  (ZEBETA ) tablet 10 mg (has no administration in time range)  furosemide  (LASIX ) tablet 20 mg (has no administration in time range)  hydrALAZINE  (APRESOLINE ) tablet 100 mg (has no administration in time range)  apixaban  (ELIQUIS ) tablet 5 mg (has no administration in time range)  methocarbamol  (ROBAXIN ) tablet 500 mg (has no administration in time range)  rOPINIRole  (REQUIP ) tablet 0.5 mg (has no administration in time range)  albuterol  (PROVENTIL ) (2.5 MG/3ML) 0.083% nebulizer solution 3 mL (has no administration in time range)  loratadine  (CLARITIN ) tablet 10  mg (has no administration in time range)  fluticasone  (FLONASE ) 50 MCG/ACT nasal spray 2 spray (has no administration in time range)  lidocaine  (LIDODERM ) 5 % 1 patch (has no administration in time range)  albuterol  (PROVENTIL ) (2.5 MG/3ML) 0.083% nebulizer solution (10 mg/hr Nebulization Given 06/30/24 1904)  azithromycin  (ZITHROMAX ) 500 mg in sodium chloride  0.9 % 250 mL IVPB (0 mg Intravenous Stopped 06/30/24 2205)  cefTRIAXone  (ROCEPHIN ) 2 g in sodium chloride  0.9 % 100 mL IVPB (0 g Intravenous Stopped 06/30/24 2102)   IMPRESSION / MDM / ASSESSMENT AND PLAN / ED COURSE  I reviewed the triage vital signs and the nursing notes.                             The patient is on the cardiac monitor to evaluate for evidence of arrhythmia and/or significant heart rate changes. Patient's presentation is most consistent with acute presentation with potential threat to life or bodily function. The Pt presents with respiratory distress highly concerning for sepsis (suspected pulmonary source). At this time, the Pt is satting well on BiPAP, normotensive, and appears HDS.  Will start empiric  antibiotics and fluids.  Due to soft BP, will administer fluids gradually with frequent reassessment. Have low suspicion for a GI, skin/soft tissue, or CNS source at this time, but will reconsider if initial workup is unremarkable.  - CBC, BMP, LFTs - VBG - UA - BCx x2, Lactate - EKG - CXR - Empiric Abx: Rocephin  and azithromycin  - Fluids: 30cc/kg LR Dispo: Admit to medicine   FINAL CLINICAL IMPRESSION(S) / ED DIAGNOSES   Final diagnoses:  Acute respiratory failure with hypoxia (HCC)  Multifocal pneumonia  Pleural effusion, bilateral   Rx / DC Orders   ED Discharge Orders     None      Note:  This document was prepared using Dragon voice recognition software and may include unintentional dictation errors.   Marveline Profeta K, MD 07/01/24 0000

## 2024-06-30 NOTE — H&P (Addendum)
 History and Physical    Patient: Julie Mcbride FMW:[id number] DOB: [birth year] DOA: 06/30/2024 DOS: the patient was seen and examined on 06/30/2024 PCP: Alla Amis, MD  Patient coming from: SNF  Chief Complaint:  Chief Complaint  Patient presents with   Respiratory Distress   HPI: Julie Mcbride is a 88 y.o. female with medical history significant of recently diagnosed stage Ivb adenocarcinoma of lung with mets to liver and bones, recent PE/DVT, CKD stage IIIa, renal artery stenosis, HTN, HLD, ischemic cardiomyopathy, dementia, obesity who presented to the ED from [facility] for evaluation of shortness of breath and hypoxia.  Per reports, pt's O2 sat was found low at 85% at SNF on her usual 2 L/min [place] O2 which she'd been started on during recent admission for PE (discharged 06/19/24).  Pt reports feeling short of breath, worsening past 2-3 days, with associated productive cough, diminished appetite.  She does report getting stronger with PT at rehab and plan was to d/c home on Saturday next weekend.  Family at bedside during admission encounter (spouse and three adult children) assisted with providing history as pt on bipap.    No fever/chills, sore throat, abdominal pain, nausea/vomiting, dysuria or other symptoms reported.    ED course -- temp 98 F, HR 108, RR 22-24, BP 155/104 > 125/81, spO2 100% on bipap. Labs obtained included CMP, CBC were notable for non-fasting glucose 268, BUN 28, Cr 1.39 (up slightly from 1.26 on 7/21), albumin  2.9, leukocytosis 19.3 (prior 8.3), Hbg 10.7 (up from 8.4 on 7/21) with neutrophil predominance.  Troponin 71.   Negative for Covid-19, Flu A/B and RSV.  Chest x-ray shows increased bilateral multifocal opacities and moderate right, small-moderate left pleural effusions with bibasilar atelectasis.  Patient is admitted to Progressive due to requiring Bipap to undergo further evaluation and management of sepsis due to apparent multifocal  pneumonia.   Review of Systems: As mentioned in the history of present illness. All other systems reviewed and are negative.    Past Medical History:  Diagnosis Date   Allergy    Anemia    Arthritis    Knees, hands   Basal cell carcinoma    back - removed   Bladder polyp    With previos hematuria   Diverticulitis    Dyspnea    Dyspnea on exertion    GERD (gastroesophageal reflux disease)    History of ETT    Myoview 7/10 EF 78%, no ischemia or infarction. Poor exercise tolerance (2'30, stopped due to fatigue). Reached 86% MPHR.   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 IVAR Mori   BACK SURGERY     BASAL CELL CARCINOMA EXCISION     back   BREAST BIOPSY Right 06/19/2017   Affirm Bx- Radial Scar   BREAST EXCISIONAL BIOPSY Right 1996   neg   BREAST LUMPECTOMY WITH NEEDLE LOCALIZATION Right 08/11/2017   Procedure: BREAST LUMPECTOMY WITH NEEDLE LOCALIZATION;  Surgeon: Claudene Larinda Bolder, MD;  Location: [facility];  Service:  General;  Laterality: Right;   CARPAL TUNNEL RELEASE Right    CATARACT EXTRACTION W/ INTRAOCULAR LENS  IMPLANT, BILATERAL     EXCISION OF BREAST BIOPSY Right 08/11/2017   excision of radial Scar   EYE SURGERY     JOINT REPLACEMENT Right    knee   KNEE ARTHROSCOPY Right 04/22/2016   Procedure: ARTHROSCOPY KNEE;  Surgeon: Helayne Glenn, MD;  Location: [facility];  Service: Orthopedics;  Laterality: Right;   knee replacement Right 01/11/2017   NASAL SINUS SURGERY  06/05/14   Dr. Milissa   POSTERIOR CERVICAL LAMINECTOMY  2005   C5-7; plate and screws    PULMONARY THROMBECTOMY Bilateral 06/11/2024   Procedure: PULMONARY THROMBECTOMY;  Surgeon: Jama Cordella MATSU, MD;  Location: [facility];  Service: Cardiovascular;  Laterality: Bilateral;   RENAL ANGIOGRAPHY Left 04/20/2020   Procedure: RENAL ANGIOGRAPHY;  Surgeon: Marea Selinda RAMAN, MD;  Location: [facility];  Service: Cardiovascular;  Laterality: Left;   TONSILLECTOMY     VIDEO BRONCHOSCOPY WITH ENDOBRONCHIAL NAVIGATION N/A 05/24/2024   Procedure: VIDEO BRONCHOSCOPY WITH ENDOBRONCHIAL NAVIGATION;  Surgeon: Parris Manna, MD;  Location: [facility];  Service: Thoracic;  Laterality: N/A;   VIDEO BRONCHOSCOPY WITH ENDOBRONCHIAL ULTRASOUND N/A 05/24/2024   Procedure: BRONCHOSCOPY, WITH EBUS;  Surgeon: Parris Manna, MD;  Location: [facility];  Service: Thoracic;  Laterality: N/A;   Social History:  reports that she has never smoked. She has never used smokeless tobacco. She reports that she does not drink alcohol and does not use drugs.  Allergies  Allergen Reactions   Chlorhexidine  Hives    SAGE wipes caused severe rash   Cyclobenzaprine Hcl     Doesn't remember reaction   Etodolac     Doesn't remember reaction   Lisinopril     Coughing up blood   Meloxicam     Doesn't remember reaction   Metaxalone     Doesn't remember reaction   Neomycin-Bacitracin Zn-Polymyx     Skin irritation   Nifedipine Swelling   Nitrofurantoin     Doesn't remember reaction   Sulfamethoxazole-Trimethoprim Hives   Sulfonamide Derivatives Hives   Tizanidine     Liver problems   Vasotec [Enalapril Maleate]     Doesn't remember reaction   Ibuprofen Rash   Tape Rash    Family History  Problem Relation Age of Onset   Ovarian cancer Mother    Cancer Mother    Stroke Father        CVA   Breast cancer Daughter 69   Breast cancer Maternal Aunt 63   Breast cancer Maternal Aunt 90   Prostate cancer Neg Hx    Bladder Cancer Neg Hx    Kidney cancer Neg Hx     Prior to Admission medications    Medication Sig Start Date End Date Taking? Authorizing Provider  acetaminophen  (TYLENOL ) 500 MG tablet Take 1,000 mg by mouth as needed for mild pain (pain score 1-3).    [provider]  albuterol  (PROVENTIL ) (2.5 MG/3ML) 0.083% nebulizer solution Inhale 3 mLs into the lungs every 6 (six) hours as needed for wheezing or shortness of breath. 06/19/24   Laurita Pillion, MD  apixaban  (ELIQUIS ) 5 MG TABS tablet Take 1 tablet (5 mg total) by mouth 2 (two) times daily. 06/19/24   Laurita Pillion, MD  aspirin  EC 81 MG tablet Take 1 tablet (81 mg total) by mouth daily. Swallow whole. 06/20/24   Laurita Pillion, MD  atorvastatin  (LIPITOR) 10 MG tablet  Take 1 tablet (10 mg total) by mouth daily. 04/20/20 06/11/24  Marea Selinda RAMAN, MD  bisoprolol  (ZEBETA ) 10 MG tablet Take 1 tablet (10 mg total) by mouth daily. 06/20/24   Zhang, Dekui, MD  cetirizine (ZYRTEC) 10 MG tablet Take 10 mg by mouth as needed.    [provider]  Cholecalciferol  (VITAMIN D ) 50 MCG (2000 UT) tablet Take 4,000 Units by mouth daily.     [provider]  estradiol  (ESTRACE  VAGINAL) 0.1 MG/GM vaginal cream Apply 0.5mg  (pea-sized amount)  just inside the vaginal introitus with a finger-tip on Monday, Wednesday and Friday nights. 12/19/23   Helon Kirsch A, PA-C  fluticasone  (FLONASE ) 50 MCG/ACT nasal spray Place 2 sprays into both nostrils as needed for allergies or rhinitis.    [provider]  furosemide  (LASIX ) 20 MG tablet Take 20 mg by mouth daily.    [provider]  guaiFENesin  (ROBITUSSIN) 100 MG/5ML liquid Take 5 mLs by mouth every 4 (four) hours as needed for cough or to loosen phlegm. 06/19/24   Laurita Pillion, MD  hydrALAZINE  (APRESOLINE ) 50 MG tablet Take 2 tablets (100 mg total) by mouth in the morning and at bedtime. TAKE 2 TABLETS BY MOUTH EVERY MORNING TAKE ONE TABLET MID-DAY AND TAKE ONE TABLET BEFORE BED 06/19/24   Laurita Pillion, MD  lidocaine  4 % Place 1 patch onto the skin daily. REMOVE &  DISCARD PATCH WITHIN 12 HOURS OR AS DIRECTED BY MD 05/15/24   [provider]  methocarbamol  (ROBAXIN ) 500 MG tablet Take 1 tablet (500 mg total) by mouth every 6 (six) hours as needed for muscle spasms. 05/14/24   Laurita Pillion, MD  oxyCODONE -acetaminophen  (PERCOCET/ROXICET) 5-325 MG tablet Take 1-2 tablets by mouth every 6 (six) hours as needed for moderate pain (pain score 4-6) or severe pain (pain score 7-10). 06/19/24   Laurita Pillion, MD  rOPINIRole  (REQUIP ) 0.25 MG tablet Take 0.5 mg by mouth 3 (three) times daily.    [provider]    Physical Exam: Vitals:   06/30/24 1900 06/30/24 1930 06/30/24 2000 06/30/24 2030  BP: (!) 144/90 125/81 119/73 112/65  Pulse: (!) 106 (!) 103 (!) 102 97  Resp: (!) 23 (!) 24 (!) 22 (!) 23  Temp:      TempSrc:      SpO2: 100% 100% 99% 100%  Height:       General exam: awake, alert, no acute distress, obese HEENT: atraumatic, clear conjunctiva, anicteric sclera, wearing bipap mask Respiratory system: diminished bases bilaterally, no wheezes or rhonchi, normal respiratory effort at rest on BiPAP. Cardiovascular system: normal S1/S2, tachycardia, regular rhythm, trace BLE edema.   Gastrointestinal system: soft, NT, ND, no HSM felt, +bowel sounds. Central nervous system: A&O x self and hospital at least. no gross focal neurologic deficits, normal speech Skin: dry, intact, normal temperature, normal color, No rashes, lesions or ulcers seen on visualized skin Psychiatry: normal mood, congruent affect    Data Reviewed:  As reviewed above  Assessment and Plan:  Severe sepsis due to multifocal pneumonia Acute on chronic respiratory failure with hypoxia and hypercapnia Severe sepsis evidenced by tachycardia, tachypnea, leukocytosis and organ dysfunction of respiratory failure. Requiring BiPAP with initial VBG pH 7.23 and pCO2 66 --Admit to Progressive --BiPAP as needed --Supplement O2 to keep sats > 92% (recent baseline 2 L/min O2  started after recent PE) --Rocephin  & Zithromax  --Check MRSA PCR & add coverage if positive --Legionella & Strep pneumo antigens --Scheduled Mucinex  --PRN bronchodilators --IS and  flutter for pulmonary hygiene --Follow blood cultures --Check sputum culture --SLP evaluation (?aspiration) --NPO for now --Continuous pulse ox --Follow pending BNP, consider diuresis --Check lactic acid  Elevated troponin - likely demand ischemia due to above.  Unlikely ACS. --Initial troponin 71 >> 198. No chest pain.  No EKG ischemic changes.   --Trend troponin to peak Note LBBB seen on prior EKG;s  Bilateral pleural effusions - moderate R, small-moderate L On admission CXR, appear overall similar to prior film --Consider thoracentesis if respiratory status is slow to improve  Recent saddle PE s/p thrombectomy / Right femoral DVT --Continue Eliquis    Stage IV lung cancer - mets to liver and bones Follows with Dr. Jacobo.  Recently diagnosed late June. Immunotherapy being considered pending improvement in performance status, based on review of recent visit 8/1. --Follow up with oncology as scheduled  CKD stage IIIa - stable, near baseline Renal artery stenosis s/p stent --Monitor BMP --Continue ASA  HTN --Resume home regimen pending med history  RLS --Resume home Requip   Generalized weakness / debility --Recently d/c to SNF on 06/19/24 --PT/OT evaluations --[facility] consult --Fall precautions   Advance Care Planning: Code status - DNR/DNR Confirmed with patient herself and family at bedside.  Patient's gold DNR form hanging on monitor in the ED room during encounter.  Consults: None  Family Communication: at bedside  Severity of Illness: The appropriate patient status for this patient is INPATIENT. Inpatient status is judged to be reasonable and necessary in order to provide the required intensity of service to ensure the patient's safety. The patient's presenting symptoms, physical  exam findings, and initial radiographic and laboratory data in the context of their chronic comorbidities is felt to place them at high risk for further clinical deterioration. Furthermore, it is not anticipated that the patient will be medically stable for discharge from the hospital within 2 midnights of admission.   * I certify that at the point of admission it is my clinical judgment that the patient will require inpatient hospital care spanning beyond 2 midnights from the point of admission due to high intensity of service, high risk for further deterioration and high frequency of surveillance required.*  Author: Burnard DELENA Cunning, DO 06/30/2024 8:46 PM  For on call review [email or web address].

## 2024-06-30 NOTE — ED Notes (Signed)
 Contacted RT to inform pt placed on [place] and to come reassess after being off BiPAP. RT states saw pt on [place] and feels like she is tolerating [place] well.

## 2024-07-01 DIAGNOSIS — A419 Sepsis, unspecified organism: Secondary | ICD-10-CM | POA: Diagnosis not present

## 2024-07-01 DIAGNOSIS — J189 Pneumonia, unspecified organism: Secondary | ICD-10-CM | POA: Diagnosis not present

## 2024-07-01 DIAGNOSIS — Z515 Encounter for palliative care: Secondary | ICD-10-CM | POA: Diagnosis not present

## 2024-07-01 LAB — CBC
HCT: 29.5 % — ABNORMAL LOW (ref 36.0–46.0)
Hemoglobin: 9.5 g/dL — ABNORMAL LOW (ref 12.0–15.0)
MCH: 29.8 pg (ref 26.0–34.0)
MCHC: 32.2 g/dL (ref 30.0–36.0)
MCV: 92.5 fL (ref 80.0–100.0)
Platelets: 184 K/uL (ref 150–400)
RBC: 3.19 MIL/uL — ABNORMAL LOW (ref 3.87–5.11)
RDW: 14.5 % (ref 11.5–15.5)
WBC: 10.1 K/uL (ref 4.0–10.5)
nRBC: 0 % (ref 0.0–0.2)

## 2024-07-01 LAB — BLOOD GAS, VENOUS
Acid-Base Excess: 2.9 mmol/L — ABNORMAL HIGH (ref 0.0–2.0)
Bicarbonate: 29.8 mmol/L — ABNORMAL HIGH (ref 20.0–28.0)
O2 Saturation: 74.9 %
Patient temperature: 37
pCO2, Ven: 54 mmHg (ref 44–60)
pH, Ven: 7.35 (ref 7.25–7.43)
pO2, Ven: 44 mmHg (ref 32–45)

## 2024-07-01 LAB — BASIC METABOLIC PANEL WITH GFR
Anion gap: 11 (ref 5–15)
BUN: 32 mg/dL — ABNORMAL HIGH (ref 8–23)
CO2: 29 mmol/L (ref 22–32)
Calcium: 9.2 mg/dL (ref 8.9–10.3)
Chloride: 101 mmol/L (ref 98–111)
Creatinine, Ser: 1.44 mg/dL — ABNORMAL HIGH (ref 0.44–1.00)
GFR, Estimated: 36 mL/min — ABNORMAL LOW (ref >60)
Glucose, Bld: 159 mg/dL — ABNORMAL HIGH (ref 70–99)
Potassium: 3.9 mmol/L (ref 3.5–5.1)
Sodium: 141 mmol/L (ref 135–145)

## 2024-07-01 LAB — STREP PNEUMONIAE URINARY ANTIGEN: Strep Pneumo Urinary Antigen: NEGATIVE

## 2024-07-01 LAB — HIV ANTIBODY (ROUTINE TESTING W REFLEX): HIV Screen 4th Generation wRfx: NONREACTIVE

## 2024-07-01 LAB — MRSA NEXT GEN BY PCR, NASAL: MRSA by PCR Next Gen: NOT DETECTED

## 2024-07-01 LAB — TROPONIN I (HIGH SENSITIVITY)
Troponin I (High Sensitivity): 637 ng/L
Troponin I (High Sensitivity): 764 ng/L (ref <18)
Troponin I (High Sensitivity): 724 ng/L (ref <18)

## 2024-07-01 LAB — MAGNESIUM: Magnesium: 2.2 mg/dL (ref 1.7–2.4)

## 2024-07-01 LAB — BRAIN NATRIURETIC PEPTIDE: B Natriuretic Peptide: 796.9 pg/mL — ABNORMAL HIGH (ref 0.0–100.0)

## 2024-07-01 LAB — PROCALCITONIN: Procalcitonin: 0.71 ng/mL

## 2024-07-01 MED ORDER — MORPHINE SULFATE (CONCENTRATE) 10 MG /0.5 ML PO SOLN
10.0000 mg | ORAL | Status: DC | PRN
  Administered 2024-07-01 – 2024-07-02 (×2): 10 mg via ORAL
  Filled 2024-07-01 (×2): qty 0.5

## 2024-07-01 MED ORDER — OXYCODONE-ACETAMINOPHEN 5-325 MG PO TABS
1.0000 | ORAL_TABLET | ORAL | Status: DC | PRN
  Administered 2024-07-02 – 2024-07-08 (×17): 1 via ORAL
  Filled 2024-07-01 (×14): qty 1

## 2024-07-01 MED ORDER — METHYLPREDNISOLONE SODIUM SUCC 40 MG IJ SOLR
40.0000 mg | Freq: Two times a day (BID) | INTRAMUSCULAR | Status: DC
  Administered 2024-07-01 – 2024-07-05 (×9): 40 mg via INTRAVENOUS
  Filled 2024-07-01 (×9): qty 1

## 2024-07-01 MED ORDER — MORPHINE SULFATE (PF) 2 MG/ML IV SOLN
2.0000 mg | INTRAVENOUS | Status: DC | PRN
  Administered 2024-07-01: 2 mg via INTRAVENOUS
  Filled 2024-07-01: qty 1

## 2024-07-01 MED ORDER — FUROSEMIDE 10 MG/ML IJ SOLN
60.0000 mg | Freq: Once | INTRAMUSCULAR | Status: AC
  Administered 2024-07-01: 60 mg via INTRAVENOUS
  Filled 2024-07-01: qty 8

## 2024-07-01 NOTE — ED Notes (Signed)
 CCMD called to notify them that the pt had been moved from [facility] to [facility]

## 2024-07-01 NOTE — Evaluation (Signed)
 Clinical/Bedside Swallow Evaluation Patient Details  Name: Julie Mcbride MRN: [id number] Date of Birth: 1936-10-23  Today's Date: 07/01/2024 Time: SLP Start Time (ACUTE ONLY): 1310 SLP Stop Time (ACUTE ONLY): 1410 SLP Time Calculation (min) (ACUTE ONLY): 60 min  Past Medical History:  Past Medical History:  Diagnosis Date   Allergy    Anemia    Arthritis    Knees, hands   Basal cell carcinoma    back - removed   Bladder polyp    With previos hematuria   Diverticulitis    Dyspnea    Dyspnea on exertion    GERD (gastroesophageal reflux disease)    History of ETT    Myoview 7/10 EF 78%, no ischemia or infarction. Poor exercise tolerance (2'30, stopped due to fatigue). Reached 86% MPHR.   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 IVAR Mori   BACK SURGERY     BASAL CELL CARCINOMA EXCISION     back   BREAST BIOPSY Right 06/19/2017   Affirm Bx- Radial Scar   BREAST EXCISIONAL BIOPSY Right 1996   neg   BREAST LUMPECTOMY WITH NEEDLE LOCALIZATION Right 08/11/2017   Procedure: BREAST LUMPECTOMY WITH NEEDLE LOCALIZATION;  Surgeon: Claudene Larinda Bolder, MD;  Location: [facility];  Service: General;  Laterality: Right;   CARPAL TUNNEL RELEASE Right    CATARACT EXTRACTION W/ INTRAOCULAR LENS  IMPLANT, BILATERAL     EXCISION OF BREAST BIOPSY Right 08/11/2017   excision of radial Scar   EYE SURGERY     JOINT REPLACEMENT Right    knee   KNEE ARTHROSCOPY Right 04/22/2016   Procedure: ARTHROSCOPY KNEE;  Surgeon: Helayne Glenn, MD;  Location: [facility];   Service: Orthopedics;  Laterality: Right;   knee replacement Right 01/11/2017   NASAL SINUS SURGERY  06/05/14   Dr. Milissa   POSTERIOR CERVICAL LAMINECTOMY  2005   C5-7; plate and screws   PULMONARY THROMBECTOMY Bilateral 06/11/2024   Procedure: PULMONARY THROMBECTOMY;  Surgeon: Jama Cordella MATSU, MD;  Location: [facility];  Service: Cardiovascular;  Laterality: Bilateral;   RENAL ANGIOGRAPHY Left 04/20/2020   Procedure: RENAL ANGIOGRAPHY;  Surgeon: Marea Selinda RAMAN, MD;  Location: [facility];  Service: Cardiovascular;  Laterality: Left;   TONSILLECTOMY     VIDEO BRONCHOSCOPY WITH ENDOBRONCHIAL NAVIGATION N/A 05/24/2024   Procedure: VIDEO BRONCHOSCOPY WITH ENDOBRONCHIAL NAVIGATION;  Surgeon: Parris Manna, MD;  Location: [facility];  Service: Thoracic;  Laterality: N/A;   VIDEO BRONCHOSCOPY WITH ENDOBRONCHIAL ULTRASOUND N/A 05/24/2024   Procedure: BRONCHOSCOPY, WITH EBUS;  Surgeon: Parris Manna, MD;  Location: [facility];  Service: Thoracic;  Laterality: N/A;   HPI:  Pt is a 88 y.o. 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 PMH includes:  osteoarthritis, diverticulitis, GERD, essential hypertension, Alzheimer's dementia and MI.  Last admit(05/2024), patient with acute hypoxic respiratory failure  and right heart strain.  Underwent mechanical thrombectomy.  Patient discharged to rehab and was readmitted 06/30/2024 with multifocal pneumonia palliative care consulted to address goals.   CXR this admit: Moderate right and small to moderate left pleural effusions,  increased since the prior exam with associated bibasilar  atelectasis.  2. Increased bilateral multifocal interstitial opacities could  reflect pulmonary edema or multifocal infection.    Assessment / Plan / Recommendation  Clinical Impression   Pt seen for BSE  today. Pt awake, A/Ox3. Verbal but SEVERELY SOB w/ increased WOB w/ any exertion including talking. Husband present. Pt stated it's difficult to get it swallowed down; it moves slowly- indicating her sternum area. Pt has GERD Baseline; declined Pulmonary status w/ mouth-breathing and suspected air swallowing.  On [place] O2 2L; afebrile. WBC 10.1.  Pt appears to present w/ grossly functional oropharyngeal phase swallowing w/ No overt oropharyngeal phase dysphagia noted, No neuromuscular deficits noted. Pt consumed po trials w/ No overt, clinical s/s of aspiration during po trials(salad at lunch meal).  HOWEVER, d/t pt's SEVERELY DECLINED PULMONARY STATUS, she appeared to take increased TIME w/ each swallow to coordinate the swallow w/ Apnea moment. REST BREAKS were encouraged w/ EACH po trial in order to aid calming her breathing for the swallow.  Pt appears at reduced risk for aspiration following aspiration precautions AND using REST BREAKS frequently during po intake. Pt has challenging factors that could impact her oropharyngeal swallowing to include deconditioning/weakness, SEVERE Pulmonary decline, GERD, Cognitive decline, and advanced age. These factors can increase risk for dysphagia as well as decreased oral intake overall.    During po trials, pt consumed all consistencies w/ no overt coughing, decline in vocal quality, or change in respiratory presentation during/post trials. O2 sats remained 97%. TIME was taken to coordinate Apnea moment for each swallow. Oral phase appeared grossly [facility] w/ timely bolus management, mastication, and control of bolus propulsion for A-P transfer for swallowing. Oral clearing achieved w/ all trial consistencies -- moistened, soft foods given. OM Exam appeared [facility] w/ no unilateral weakness noted. Speech Clear. Pt fed self w/ setup support.    Recommend a Mech Soft consistency diet w/ well-Cut meats, moistened foods; Foods of Pleasure but chopped down. Thin liquids --  CUP drinking only. No Straws. Support tray setup for pt to self-feed. Recommend aspiration precautions including SMALL SINGLE BITES/SIPs SLOWLY, rest breaks b/t bites/sips, REFLUX precautions. Pills CRUSHED in Puree for safer, easier swallowing. Assistance at meals for conservation of energy. REST BREAKS OFTEN. Education given on Pills in Puree; food consistencies and easier to eat options; general aspiration precautions to pt and Husband.  STRONGLY recommend f/u w/ Palliative Care for discussion of impact of Cancer/txs (including impact on swallowing); GOC. MD/NSG to reconsult if any new needs arise. NSG updated, agreed. MD updated. Recommend Dietician f/u for support. SLP Visit Diagnosis: Dysphagia, unspecified (R13.10) (significantly declined Pulmonary status baseline- lung Ca)    Aspiration Risk  Mild aspiration risk;Risk for inadequate nutrition/hydration (baseline Pulmonary issues)    Diet Recommendation   Thin;Age appropriate regular (chopped, moistened foods easy to chew/eat for conservation of energy AND pleasure) = a Mech Soft consistency diet w/ well-Cut meats, moistened foods; Foods of Pleasure but chopped down. Thin liquids -- CUP drinking only. No Straws. Support tray setup for pt to self-feed. Recommend aspiration precautions including SMALL SINGLE BITES/SIPs SLOWLY, rest breaks b/t bites/sips, REFLUX precautions. Assistance at meals for conservation of energy. REST BREAKS OFTEN.   Medication Administration: Crushed with puree  Other  Recommendations Recommended Consults:  (Palliative Care) Oral Care Recommendations: Oral care BID;Patient independent with oral care (setup)     Assistance Recommended at Discharge  Frequently  Functional Status Assessment Patient has had a recent decline in their functional status and/or demonstrates limited ability to make significant improvements in function in a reasonable and predictable amount of time  Frequency and Duration  (n/a)   (n/a)        Prognosis Prognosis for improved oropharyngeal function: Guarded (-Fair) Barriers to Reach Goals: Cognitive deficits;Time post onset;Severity of deficits;Motivation Barriers/Prognosis Comment: Dementia; Pulmonary decline- lung Ca/decline      Swallow Study   General Date of Onset: 06/30/24 HPI: Pt is a 88 y.o. 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 PMH includes:  osteoarthritis, diverticulitis, GERD, essential hypertension, Alzheimer's dementia and MI.  Last admit(05/2024), patient with acute hypoxic respiratory failure and right heart strain.  Underwent mechanical thrombectomy.  Patient discharged to rehab and was readmitted 06/30/2024 with multifocal pneumonia palliative care consulted to address goals.   CXR this admit: Moderate right and small to moderate left pleural effusions,  increased since the prior exam with associated bibasilar  atelectasis.  2. Increased bilateral multifocal interstitial opacities could  reflect pulmonary edema or multifocal infection. Type of Study: Bedside Swallow Evaluation Previous Swallow Assessment: 05/2024- mech soft/regular diet; thins rec'd w/ general aspiration precautions Diet Prior to this Study: NPO Temperature Spikes Noted: No (wbc 10.1) Respiratory Status: Nasal cannula (2L) History of Recent Intubation: No Behavior/Cognition: Alert;Cooperative;Pleasant mood;Distractible;Requires cueing (min) Oral Cavity Assessment: Within Functional Limits Oral Care Completed by SLP: Recent completion by staff Oral Cavity - Dentition: Adequate natural dentition Vision: Functional for self-feeding Self-Feeding Abilities: Able to feed self;Needs assist;Needs set up Patient Positioning: Upright in bed (supported upright) Baseline Vocal Quality: Normal (w/ choppy speech d/t WOB) Volitional Cough:  Strong Volitional Swallow: Able to elicit    Oral/Motor/Sensory Function Overall Oral Motor/Sensory Function: Within functional limits   Ice Chips Ice chips: Within functional limits Presentation: Spoon (fed; 3 trials)   Thin Liquid Thin Liquid: Impaired (WNL in setting of WOB at Baseline) Presentation: Self Fed;Cup (9 trials) Oral Phase Impairments:  (wfl) Pharyngeal  Phase Impairments:  (pt needed to monitor apnea timing)    Nectar Thick Nectar Thick Liquid: Not tested   Honey Thick Honey Thick Liquid: Not tested   Puree Puree: Impaired (WFL in setting of her WOB baseline) Presentation: Spoon;Self Fed (3 trials) Oral Phase Impairments:  (wfl) Pharyngeal Phase Impairments:  (pt needed to monitor apnea timing)   Solid     Solid: Impaired (WFL in setting of WOB baseline) Presentation: Self Fed;Spoon (3 trials) Oral Phase Impairments:  (wfl) Oral Phase Functional Implications: Prolonged oral transit Pharyngeal Phase Impairments:  (pt needed to monitor apnea timing) Other Comments: moistened foods         Comer Portugal, MS, CCC-SLP Speech Language Pathologist Rehab Services; [facility] [place] [phone] (ascom) Shaia Porath 07/01/2024,5:53 PM

## 2024-07-01 NOTE — Progress Notes (Signed)
 PT Cancellation Note  Patient Details Name: Julie Mcbride MRN: [id number] DOB: [birth year]   Cancelled Treatment:    Reason Eval/Treat Not Completed: Medical issues which prohibited therapy. PT orders received and pt chart reviewed. Pt with significantly up-trending troponin levels. MD confirming therapy hold at this time. Will follow up with PT eval when pt is medically appropriate.   Camie CHARLENA Kluver, PT, DPT 8:11 AM,07/01/24 Physical Therapist - [facility]

## 2024-07-01 NOTE — ED Notes (Addendum)
Pt in bed, husband at bedside

## 2024-07-01 NOTE — ED Notes (Signed)
 Pt changed into hospital gown, given fan and new socks per request, and assisted to bedside commode.

## 2024-07-01 NOTE — ED Notes (Signed)
 MD reached out to requesting IV pain medications to replace pt's oxycodone  and robaxin  medications while pt is NPO until speech therapy does their accessment.

## 2024-07-01 NOTE — Progress Notes (Signed)
 OT Cancellation Note  Patient Details Name: Julie Mcbride MRN: [id number] DOB: 1936-08-29   Cancelled Treatment:    Reason Eval/Treat Not Completed: Patient not medically ready. Consult received, chart reviewed. Pt with significantly up-trending troponin levels. MD confirming therapy hold at this time. Will follow up with OT eval when pt is medically appropriate.   Lamichael Youkhana R., MPH, MS, OTR/L ascom [phone] 07/01/24, 8:26 AM

## 2024-07-01 NOTE — ED Notes (Signed)
 Pt educated on ST recommendations. Pt informed not to use a straw.

## 2024-07-01 NOTE — Progress Notes (Signed)
 PROGRESS NOTE    Julie Mcbride  FMW:[id number] DOB: 1936-08-31 DOA: 06/30/2024 PCP: Alla Amis, MD  [id number]  LOS: 1 day   Brief hospital course:   Assessment & Plan: Julie Mcbride is a 88 y.o. female with medical history significant of recently diagnosed stage Ivb adenocarcinoma of lung with mets to liver and bones, recent PE/DVT, CKD stage IIIa, renal artery stenosis, HTN, HLD, ischemic cardiomyopathy, dementia, obesity who presented to the ED from [facility] for evaluation of shortness of breath and hypoxia.  Per reports, pt's O2 sat was found low at 85% at SNF on her usual 2 L/min [place] O2 which she'd been started on during recent admission for PE (discharged 06/19/24).  Pt reports feeling short of breath, worsening past 2-3 days, with associated productive cough, diminished appetite.  She does report getting stronger with PT at rehab and plan was to d/c home on Saturday next weekend.    Acute on chronic respiratory failure with hypoxia and hypercapnia On 2L O2 at baseline --multifactorial, with lung cancer, pleural effusions, possible pulm edema vs multifocal infection. --pt has significant dyspnea.  No wheezing, no hx of COPD or asthma. --tx for PNA and pulm edema --start steroid to see if it will help --morphine  PRN for air hunger --BiPAP PRN for increased work of breathing.  Sepsis due to multifocal pneumonia --tachycardia, tachypnea, leukocytosis  --cont ceftriaxone  and azithro  Elevated troponin  - likely demand ischemia due to above.  Unlikely ACS.  Trop peaked around 700's.   Bilateral pleural effusions  - moderate R, small-moderate L.  Also possible pulm edema --IV lasix  60 --Consider thoracentesis if respiratory status is slow to improve   Recent saddle PE s/p thrombectomy / Right femoral DVT --Continue Eliquis     Stage IV lung cancer  - mets to liver and bones --messaged Dr. Jacobo and onc palliative care   CKD stage IIIa  - stable, near  baseline  Renal artery stenosis s/p stent --Monitor BMP --Continue ASA   HTN --cont bisoprolol , hydralazine    RLS --cont home Requip    Generalized weakness / debility --Recently d/c to SNF on 06/19/24 --PT/OT evaluations --TOC consult --Fall precautions   DVT prophylaxis: On:Eliquis  Code Status: DNR  Family Communication: daughter and husband updated at bedside today Level of care: Progressive Dispo:   The patient is from: home Anticipated d/c is to: SNF rehab Anticipated d/c date is: 2-3 days   Subjective and Interval History:  Pt reported very short of breath, dry cough.  No chest pain.   Objective: Vitals:   07/01/24 1520 07/01/24 1530 07/01/24 1600 07/01/24 1630  BP:  117/60 (!) 132/57 (!) 110/52  Pulse:  72 71 74  Resp:  14 11 13   Temp: 97.8 F (36.6 C)     TempSrc: Oral     SpO2:  99% 100% 98%  Height:        Intake/Output Summary (Last 24 hours) at 07/01/2024 1742 Last data filed at 06/30/2024 2205 Gross per 24 hour  Intake 344.83 ml  Output --  Net 344.83 ml   There were no vitals filed for this visit.  Examination:   Constitutional: NAD, AAOx3 HEENT: conjunctivae and lids normal, EOMI CV: No cyanosis.   RESP: increased RR, no wheezes, 2L O2 sating 96%. Neuro: II - XII grossly intact.     Data Reviewed: I have personally reviewed labs and imaging studies  Time spent: 50 minutes  Ellouise Haber, MD [facility] If 7PM-7AM, please contact night-coverage  07/01/2024, 5:42 PM

## 2024-07-01 NOTE — Consult Note (Signed)
 Palliative Medicine [facility] Telephone:([phone] [phone]   Name: Julie Mcbride Date: 07/01/2024 MRN: [id number]  DOB: 08-23-1936  Patient Care Team: Alla Amis, MD as PCP - General (Family Medicine) Verdene Gills, RN as Oncology Nurse Navigator Jacobo, Evalene PARAS, MD as Consulting Physician (Oncology)    REASON FOR CONSULTATION: Julie Mcbride is a 88 y.o. 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  Patient with acute hypoxic respiratory failure and right heart strain.  Underwent mechanical thrombectomy.  Patient discharged to rehab and was readmitted 06/30/2024 with multifocal pneumonia palliative care consulted to address goals.  SOCIAL HISTORY:     reports that she has never smoked. She has never used smokeless tobacco. She reports that she does not drink alcohol and does not use drugs.  Patient is married and lives at home with her husband.  She has 2 sons and a daughter who live nearby.  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  ADVANCE DIRECTIVES:  On file  CODE STATUS: DNR  PAST MEDICAL HISTORY: Past Medical History:  Diagnosis Date   Allergy    Anemia    Arthritis    Knees, hands   Basal cell carcinoma    back - removed   Bladder polyp    With previos hematuria   Diverticulitis    Dyspnea    Dyspnea on exertion    GERD (gastroesophageal reflux disease)    History of ETT    Myoview 7/10 EF 78%, no ischemia or infarction. Poor exercise tolerance (2'30, stopped due to fatigue). Reached 86% MPHR.   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 IVAR Mori   BACK SURGERY     BASAL CELL CARCINOMA EXCISION     back   BREAST BIOPSY Right 06/19/2017   Affirm Bx- Radial Scar   BREAST EXCISIONAL BIOPSY Right 1996   neg   BREAST LUMPECTOMY WITH NEEDLE LOCALIZATION Right 08/11/2017   Procedure: BREAST LUMPECTOMY WITH NEEDLE LOCALIZATION;  Surgeon: Claudene Larinda Bolder, MD;  Location: [facility] ORS;  Service: General;  Laterality: Right;   CARPAL TUNNEL RELEASE Right    CATARACT EXTRACTION W/ INTRAOCULAR LENS  IMPLANT, BILATERAL     EXCISION OF BREAST BIOPSY Right 08/11/2017   excision of radial Scar   EYE SURGERY     JOINT REPLACEMENT Right    knee   KNEE ARTHROSCOPY Right 04/22/2016   Procedure: ARTHROSCOPY KNEE;  Surgeon: Helayne Glenn, MD;  Location: [facility];  Service: Orthopedics;  Laterality: Right;   knee replacement Right 01/11/2017   NASAL SINUS SURGERY  06/05/14   Dr. Milissa   POSTERIOR CERVICAL LAMINECTOMY  2005   C5-7; plate and screws   PULMONARY THROMBECTOMY Bilateral 06/11/2024   Procedure: PULMONARY THROMBECTOMY;  Surgeon: Jama Cordella MATSU, MD;  Location: [facility]  INVASIVE CV LAB;  Service: Cardiovascular;  Laterality: Bilateral;   RENAL ANGIOGRAPHY Left 04/20/2020   Procedure: RENAL ANGIOGRAPHY;  Surgeon: Marea Selinda RAMAN, MD;  Location: [facility] INVASIVE CV LAB;  Service: Cardiovascular;  Laterality: Left;   TONSILLECTOMY     VIDEO BRONCHOSCOPY WITH ENDOBRONCHIAL NAVIGATION N/A 05/24/2024   Procedure: VIDEO BRONCHOSCOPY WITH ENDOBRONCHIAL NAVIGATION;  Surgeon: Parris Manna, MD;  Location: [facility] ORS;  Service: Thoracic;  Laterality: N/A;   VIDEO BRONCHOSCOPY WITH ENDOBRONCHIAL ULTRASOUND N/A 05/24/2024   Procedure: BRONCHOSCOPY, WITH EBUS;  Surgeon: Parris Manna, MD;   Location: [facility] ORS;  Service: Thoracic;  Laterality: N/A;    HEMATOLOGY/ONCOLOGY HISTORY:  Oncology History  Adenocarcinoma, lung, unspecified laterality (HCC)  05/10/2024 Initial Diagnosis   Adenocarcinoma, lung, unspecified laterality (HCC)   05/30/2024 Cancer Staging   Staging form: Lung, AJCC V9 - Clinical stage from 05/30/2024: Stage IVB (cT1b, cN3, cM1c2) - Signed by Jacobo Evalene PARAS, MD on 05/30/2024     ALLERGIES:  is allergic to chlorhexidine , cyclobenzaprine hcl, etodolac, lisinopril, meloxicam, metaxalone, neomycin-bacitracin zn-polymyx, nifedipine, nitrofurantoin, sulfamethoxazole-trimethoprim, sulfonamide derivatives, tizanidine, vasotec [enalapril maleate], ibuprofen, and tape.  MEDICATIONS:  Current Facility-Administered Medications  Medication Dose Route Frequency Provider Last Rate Last Admin   acetaminophen  (TYLENOL ) tablet 1,000 mg  1,000 mg Oral Q8H PRN Fausto Sor A, DO       albuterol  (PROVENTIL ) (2.5 MG/3ML) 0.083% nebulizer solution 3 mL  3 mL Inhalation Q6H PRN Fausto Sor A, DO       apixaban  (ELIQUIS ) tablet 5 mg  5 mg Oral BID Fausto Sor A, DO   5 mg at 07/01/24 1004   aspirin  EC tablet 81 mg  81 mg Oral Daily Fausto Sor A, DO   81 mg at 07/01/24 1003   atorvastatin  (LIPITOR) tablet 10 mg  10 mg Oral Daily Fausto Sor A, DO       azithromycin  (ZITHROMAX ) 500 mg in sodium chloride  0.9 % 250 mL IVPB  500 mg Intravenous Q24H Fausto Sor A, DO       bisoprolol  (ZEBETA ) tablet 10 mg  10 mg Oral Daily Fausto Sor A, DO   10 mg at 07/01/24 1004   cefTRIAXone  (ROCEPHIN ) 2 g in sodium chloride  0.9 % 100 mL IVPB  2 g Intravenous Q24H Fausto Sor A, DO       dextromethorphan -guaiFENesin  (MUCINEX  DM) 30-600 MG per 12 hr tablet 1 tablet  1 tablet Oral BID Fausto Sor A, DO   1 tablet at 07/01/24 1004   fluticasone  (FLONASE ) 50 MCG/ACT nasal spray 2 spray  2 spray Each Nare PRN Fausto Sor A, DO       guaiFENesin  (ROBITUSSIN) 100  MG/5ML liquid 5 mL  5 mL Oral Q4H PRN Fausto Sor A, DO       hydrALAZINE  (APRESOLINE ) tablet 100 mg  100 mg Oral BID Fausto Sor A, DO   100 mg at 07/01/24 1004   lidocaine  (LIDODERM ) 5 % 1 patch  1 patch Transdermal Q24H Lenon Elsie HERO, RPH   1 patch at 07/01/24 1004   loratadine  (CLARITIN ) tablet 10 mg  10 mg Oral Daily Fausto Sor A, DO       methocarbamol  (ROBAXIN ) tablet 500 mg  500 mg Oral Q6H PRN Fausto Sor A, DO   500 mg at 07/01/24 1004   methylPREDNISolone  sodium succinate (SOLU-MEDROL ) 40 mg/mL injection 40 mg  40 mg Intravenous BID Awanda City, MD       morphine  CONCENTRATE 10 mg / 0.5 ml oral solution 10  mg  10 mg Oral Q4H PRN Awanda City, MD   10 mg at 07/01/24 1314   oxyCODONE -acetaminophen  (PERCOCET/ROXICET) 5-325 MG per tablet 1 tablet  1 tablet Oral Q4H PRN Awanda City, MD       rOPINIRole  (REQUIP ) tablet 0.5 mg  0.5 mg Oral TID Fausto Sor A, DO   0.5 mg at 07/01/24 1004   Current Outpatient Medications  Medication Sig Dispense Refill   acetaminophen  (TYLENOL ) 500 MG tablet Take 1,000 mg by mouth every 8 (eight) hours as needed.     ACETAMINOPHEN  PO Take 650 mg by mouth every 8 (eight) hours.     Acidophilus Lactobacillus CAPS Take 1 capsule by mouth daily.     albuterol  (PROVENTIL ) (2.5 MG/3ML) 0.083% nebulizer solution Inhale 3 mLs into the lungs every 6 (six) hours as needed for wheezing or shortness of breath.     ALPRAZolam  (XANAX ) 0.25 MG tablet Take 0.25 mg by mouth 3 (three) times daily as needed for anxiety.     apixaban  (ELIQUIS ) 5 MG TABS tablet Take 1 tablet (5 mg total) by mouth 2 (two) times daily.     aspirin  EC 81 MG tablet Take 1 tablet (81 mg total) by mouth daily. Swallow whole.     atorvastatin  (LIPITOR) 10 MG tablet Take 1 tablet (10 mg total) by mouth daily. 30 tablet 11   bisoprolol  (ZEBETA ) 10 MG tablet Take 1 tablet (10 mg total) by mouth daily.     cetirizine (ZYRTEC) 10 MG tablet Take 10 mg by mouth as needed.      Cholecalciferol  (VITAMIN D ) 50 MCG (2000 UT) tablet Take 4,000 Units by mouth daily.      doxycycline (VIBRAMYCIN) 100 MG capsule Take 100 mg by mouth 2 (two) times daily.     estradiol  (ESTRACE  VAGINAL) 0.1 MG/GM vaginal cream Apply 0.5mg  (pea-sized amount)  just inside the vaginal introitus with a finger-tip on Monday, Wednesday and Friday nights. 30 g 12   fluticasone  (FLONASE ) 50 MCG/ACT nasal spray Place 2 sprays into both nostrils as needed for allergies or rhinitis.     furosemide  (LASIX ) 20 MG tablet Take 20 mg by mouth daily.     guaiFENesin  (ROBITUSSIN) 100 MG/5ML liquid Take 5 mLs by mouth every 4 (four) hours as needed for cough or to loosen phlegm.     hydrALAZINE  (APRESOLINE ) 50 MG tablet Take 2 tablets (100 mg total) by mouth in the morning and at bedtime. TAKE 2 TABLETS BY MOUTH EVERY MORNING TAKE ONE TABLET MID-DAY AND TAKE ONE TABLET BEFORE BED     lidocaine  4 % Place 1 patch onto the skin daily. REMOVE & DISCARD PATCH WITHIN 12 HOURS OR AS DIRECTED BY MD     methocarbamol  (ROBAXIN ) 500 MG tablet Take 1 tablet (500 mg total) by mouth every 6 (six) hours as needed for muscle spasms. 30 tablet 0   oxyCODONE -acetaminophen  (PERCOCET/ROXICET) 5-325 MG tablet Take 1-2 tablets by mouth every 6 (six) hours as needed for moderate pain (pain score 4-6) or severe pain (pain score 7-10). (Patient taking differently: Take 1 tablet by mouth every 6 (six) hours as needed for moderate pain (pain score 4-6) or severe pain (pain score 7-10).) 12 tablet 0   polyethylene glycol (MIRALAX  / GLYCOLAX ) 17 g packet Take 17 g by mouth 2 (two) times daily.     rOPINIRole  (REQUIP ) 0.25 MG tablet Take 0.5 mg by mouth 3 (three) times daily.     senna-docusate (SENOKOT-S) 8.6-50 MG tablet Take 1 tablet  by mouth 2 (two) times daily.     triamcinolone  ointment (KENALOG ) 0.1 % Apply topically 2 (two) times daily as needed.      VITAL SIGNS: BP 137/66   Pulse 73   Temp 97.8 F (36.6 C) (Oral)   Resp 18   Ht 5'  6 (1.676 m)   SpO2 100%   BMI 34.87 kg/m  There were no vitals filed for this visit.   Estimated body mass index is 34.87 kg/m as calculated from the following:   Height as of this encounter: 5' 6 (1.676 m).   Weight as of 06/19/24: 216 lb 0.8 oz (98 kg).  LABS: CBC:    Component Value Date/Time   WBC 10.1 07/01/2024 0348   HGB 9.5 (L) 07/01/2024 0348   HGB 10.9 (L) 09/13/2013 0633   HCT 29.5 (L) 07/01/2024 0348   HCT 32.5 (L) 09/13/2013 0633   PLT 184 07/01/2024 0348   PLT 158 09/13/2013 0633   MCV 92.5 07/01/2024 0348   MCV 90 09/13/2013 0633   NEUTROABS 12.7 (H) 06/30/2024 1849   NEUTROABS 10.6 (H) 09/13/2013 0633   LYMPHSABS 4.1 (H) 06/30/2024 1849   LYMPHSABS 2.6 09/13/2013 0633   MONOABS 1.7 (H) 06/30/2024 1849   MONOABS 1.1 (H) 09/13/2013 0633   EOSABS 0.4 06/30/2024 1849   EOSABS 0.0 09/13/2013 0633   BASOSABS 0.1 06/30/2024 1849   BASOSABS 0.0 09/13/2013 0633   Comprehensive Metabolic Panel:    Component Value Date/Time   NA 141 07/01/2024 0348   NA 140 09/11/2013 0517   K 3.9 07/01/2024 0348   K 3.7 09/11/2013 0517   CL 101 07/01/2024 0348   CL 108 (H) 09/11/2013 0517   CO2 29 07/01/2024 0348   CO2 26 09/11/2013 0517   BUN 32 (H) 07/01/2024 0348   BUN 26 (H) 09/11/2013 0517   CREATININE 1.44 (H) 07/01/2024 0348   CREATININE 1.14 09/11/2013 0517   GLUCOSE 159 (H) 07/01/2024 0348   GLUCOSE 138 (H) 09/11/2013 0517   CALCIUM  9.2 07/01/2024 0348   CALCIUM  8.9 09/11/2013 0517   AST 33 06/30/2024 1849   AST 23 09/08/2013 0127   ALT 20 06/30/2024 1849   ALT 24 09/08/2013 0127   ALKPHOS 144 (H) 06/30/2024 1849   ALKPHOS 73 09/08/2013 0127   BILITOT 0.7 06/30/2024 1849   BILITOT 0.3 09/08/2013 0127   PROT 6.1 (L) 06/30/2024 1849   PROT 7.3 09/08/2013 0127   ALBUMIN  2.9 (L) 06/30/2024 1849   ALBUMIN  3.5 09/08/2013 0127    RADIOGRAPHIC STUDIES: DG Chest Port 1 View Result Date: 06/30/2024 CLINICAL DATA:  Shortness of breath. EXAM: PORTABLE CHEST 1  VIEW COMPARISON:  06/14/2024. FINDINGS: The heart size and mediastinal contours are unchanged. Moderate right and small to moderate left pleural effusions, increased since the prior exam with associated bibasilar atelectasis. Increased bilateral multifocal interstitial opacities. No pneumothorax. Cervical fixation hardware. No acute osseous abnormality. IMPRESSION: 1. Moderate right and small to moderate left pleural effusions, increased since the prior exam with associated bibasilar atelectasis. 2. Increased bilateral multifocal interstitial opacities could reflect pulmonary edema or multifocal infection. Electronically Signed   By: Harrietta Sherry M.D.   On: 06/30/2024 19:34   MR BRAIN W WO CONTRAST Result Date: 06/16/2024 EXAM: MRI BRAIN WITH AND WITHOUT CONTRAST 06/16/2024 06:30:00 PM TECHNIQUE: Multiplanar multisequence MRI of the head/brain was performed with and without the administration of intravenous contrast. COMPARISON: 06/07/2023 CLINICAL HISTORY: Cancer staging. Lung CA. Eval for mets. FINDINGS: BRAIN AND VENTRICLES: No  acute infarct. No acute intracranial hemorrhage. No mass effect or midline shift. No hydrocephalus. The sella is unremarkable. Normal flow voids. No mass or abnormal enhancement. Multifocal hyperintense T2-weighted signal within the cerebral white matter, most commonly due to chronic small vessel disease. Generalized volume loss. 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 ORBITS: No acute abnormality. SINUSES: No acute abnormality. BONES AND SOFT TISSUES: Normal bone marrow signal and enhancement. No acute soft tissue abnormality. IMPRESSION: 1. No acute intracranial abnormality. 2. Multifocal hyperintense T2-weighted signal within the cerebral white matter, most commonly due to chronic small vessel disease. 3. Generalized volume loss. 4. 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 Electronically signed by: Franky Stanford MD 06/16/2024 08:35 PM EDT RP Workstation:  [id number]   DG Chest Port 1 View Result Date: 06/14/2024 CLINICAL DATA:  Cough and breathing difficulty EXAM: PORTABLE CHEST 1 VIEW COMPARISON:  Chest radiograph dated 06/10/2024 FINDINGS: Low lung volumes with bronchovascular crowding. Increased patchy bibasilar opacities and mild interstitial opacities. Similar trace bilateral pleural effusions. No pneumothorax. Similar mildly enlarged cardiomediastinal silhouette. Cervical spinal fixation hardware appears intact. IMPRESSION: 1. Increased patchy bibasilar opacities and mild interstitial opacities, which may represent pulmonary edema or multifocal infection. 2. Similar trace bilateral pleural effusions. Electronically Signed   By: Limin  Xu M.D.   On: 06/14/2024 13:41   DG HIP UNILAT WITH PELVIS 2-3 VIEWS RIGHT Result Date: 06/14/2024 CLINICAL DATA:  [id number] Hip pain [id number] EXAM: DG HIP (WITH OR WITHOUT PELVIS) 2-3V RIGHT COMPARISON:  March 09, 2023 FINDINGS: No evidence of pelvic fracture or diastasis.No acute hip fracture or dislocation.Moderate joint space loss of the left hip. Degenerative disc disease of the visualized lumbar spine.Soft tissues are unremarkable. IMPRESSION: 1. No acute fracture, pelvic bone diastasis, or dislocation. 2. Moderate osteoarthritis of the left hip. Electronically Signed   By: Rogelia Myers M.D.   On: 06/14/2024 13:20   ECHOCARDIOGRAM COMPLETE Result Date: 06/11/2024    ECHOCARDIOGRAM REPORT   Patient Name:   DARNELLA ZEITER Date of Exam: 06/11/2024 Medical Rec #:  [id number]        Height:       65.0 in Accession #:    [id number]       Weight:       210.9 lb Date of Birth:  [birth year]       BSA:          2.023 m Patient Age:    87 years         BP:           113/66 mmHg Patient Gender: F                HR:           91 bpm. Exam Location:  [facility] Procedure: 2D Echo, Cardiac Doppler and Color Doppler (Both Spectral and Color            Flow Doppler were utilized during procedure). Indications:     pulmonary embolus I26.09   History:         Patient has prior history of Echocardiogram examinations, most                  recent 12/26/2020. Previous Myocardial Infarction,                  Signs/Symptoms:Dyspnea; Risk Factors:Hypertension.  Sonographer:     Christopher Furnace Referring Phys:  [id number] JAN A MANSY Diagnosing Phys: Lonni Hanson MD IMPRESSIONS  1. Left ventricular ejection fraction, by  estimation, is 65 to 70%. The left ventricle has normal function. The left ventricle has no regional wall motion abnormalities. There is moderate left ventricular hypertrophy. Left ventricular diastolic parameters are indeterminate. There is the interventricular septum is flattened in diastole ('D' shaped left ventricle), consistent with right ventricular volume overload.  2. Pulmonary artery pressure is mildly elevated (RVSP 35 mmHg plus central venous/right atrial pressure). Right ventricular systolic function is moderately reduced. The right ventricular size is moderately enlarged.  3. The mitral valve is degenerative. Trivial mitral valve regurgitation. No evidence of mitral stenosis.  4. The aortic valve is tricuspid. Aortic valve regurgitation is not visualized. No aortic stenosis is present. FINDINGS  Left Ventricle: Left ventricular ejection fraction, by estimation, is 65 to 70%. The left ventricle has normal function. The left ventricle has no regional wall motion abnormalities. The left ventricular internal cavity size was normal in size. There is  moderate left ventricular hypertrophy. The interventricular septum is flattened in diastole ('D' shaped left ventricle), consistent with right ventricular volume overload. Left ventricular diastolic parameters are indeterminate. Right Ventricle: Pulmonary artery pressure is mildly elevated (RVSP 35 mmHg plus central venous/right atrial pressure). The right ventricular size is moderately enlarged. No increase in right ventricular wall thickness. Right ventricular systolic function is moderately  reduced. Left Atrium: Left atrial size was normal in size. Right Atrium: Right atrial size was normal in size. Pericardium: There is no evidence of pericardial effusion. Mitral Valve: The mitral valve is degenerative in appearance. There is mild thickening of the mitral valve leaflet(s). Mild mitral annular calcification. Trivial mitral valve regurgitation. No evidence of mitral valve stenosis. Tricuspid Valve: The tricuspid valve is normal in structure. Tricuspid valve regurgitation is mild. Aortic Valve: The aortic valve is tricuspid. Aortic valve regurgitation is not visualized. No aortic stenosis is present. Aortic valve mean gradient measures 3.0 mmHg. Aortic valve peak gradient measures 5.2 mmHg. Aortic valve area, by VTI measures 2.53 cm. Pulmonic Valve: The pulmonic valve was not well visualized. Pulmonic valve regurgitation is not visualized. No evidence of pulmonic stenosis. Aorta: The aortic root is normal in size and structure. Pulmonary Artery: The pulmonary artery is not well seen. Venous: The inferior vena cava was not well visualized. IAS/Shunts: The interatrial septum was not well visualized.  LEFT VENTRICLE PLAX 2D LVIDd:         3.30 cm   Diastology LVIDs:         2.20 cm   LV e' medial:    4.79 cm/s LV PW:         1.20 cm   LV E/e' medial:  15.6 LV IVS:        1.38 cm   LV e' lateral:   11.50 cm/s LVOT diam:     2.00 cm   LV E/e' lateral: 6.5 LV SV:         44 LV SV Index:   22 LVOT Area:     3.14 cm  RIGHT VENTRICLE RV Basal diam:  4.00 cm RV Mid diam:    4.00 cm LEFT ATRIUM             Index        RIGHT ATRIUM           Index LA diam:        3.00 cm 1.48 cm/m   RA Area:     17.90 cm LA Vol (A2C):   42.0 ml 20.76 ml/m  RA Volume:   51.30 ml  25.36 ml/m LA Vol (A4C):   18.7 ml 9.24 ml/m LA Biplane Vol: 29.2 ml 14.43 ml/m  AORTIC VALVE AV Area (Vmax):    2.51 cm AV Area (Vmean):   2.20 cm AV Area (VTI):     2.53 cm AV Vmax:           114.00 cm/s AV Vmean:          88.200 cm/s AV VTI:             0.174 m AV Peak Grad:      5.2 mmHg AV Mean Grad:      3.0 mmHg LVOT Vmax:         91.00 cm/s LVOT Vmean:        61.700 cm/s LVOT VTI:          0.140 m LVOT/AV VTI ratio: 0.80  AORTA Ao Root diam: 3.20 cm MITRAL VALVE                TRICUSPID VALVE MV Area (PHT): 5.97 cm     TR Peak grad:   34.8 mmHg MV Decel Time: 127 msec     TR Vmax:        295.00 cm/s MV E velocity: 74.90 cm/s MV A velocity: 125.00 cm/s  SHUNTS MV E/A ratio:  0.60         Systemic VTI:  0.14 m                             Systemic Diam: 2.00 cm Lonni Hanson MD Electronically signed by Lonni Hanson MD Signature Date/Time: 06/11/2024/3:14:06 PM    Final    PERIPHERAL VASCULAR CATHETERIZATION Result Date: 06/11/2024 See surgical note for result.  US  Venous Img Lower Bilateral (DVT) Result Date: 06/11/2024 CLINICAL DATA:  Patient with pulmonary embolism.  Evaluate for DVT. EXAM: BILATERAL LOWER EXTREMITY VENOUS DOPPLER ULTRASOUND TECHNIQUE: Gray-scale sonography with graded compression, as well as color Doppler and duplex ultrasound were performed to evaluate the lower extremity deep venous systems from the level of the common femoral vein and including the common femoral, femoral, profunda femoral, popliteal and calf veins including the posterior tibial, peroneal and gastrocnemius veins when visible. The superficial great saphenous vein was also interrogated. Spectral Doppler was utilized to evaluate flow at rest and with distal augmentation maneuvers in the common femoral, femoral and popliteal veins. COMPARISON:  None Available. FINDINGS: RIGHT LOWER EXTREMITY Common Femoral Vein: No evidence of thrombus. Normal compressibility, respiratory phasicity and response to augmentation. Saphenofemoral Junction: No evidence of thrombus. Normal compressibility and flow on color Doppler imaging. Profunda Femoral Vein: No evidence of thrombus. Normal compressibility and flow on color Doppler imaging. Femoral Vein: Examination is positive  for nonocclusive thrombus within the proximal right femoral vein. Popliteal Vein: No evidence of thrombus. Normal compressibility, respiratory phasicity and response to augmentation. Calf Veins: No evidence of thrombus. Normal compressibility and flow on color Doppler imaging. Superficial Great Saphenous Vein: No evidence of thrombus. Normal compressibility. Venous Reflux:  None. Other Findings:  None. LEFT LOWER EXTREMITY Common Femoral Vein: No evidence of thrombus. Normal compressibility, respiratory phasicity and response to augmentation. Saphenofemoral Junction: No evidence of thrombus. Normal compressibility and flow on color Doppler imaging. Profunda Femoral Vein: No evidence of thrombus. Normal compressibility and flow on color Doppler imaging. Femoral Vein: No evidence of thrombus. Normal compressibility, respiratory phasicity and response to augmentation. Popliteal Vein: No evidence of thrombus. Normal compressibility, respiratory phasicity and  response to augmentation. Calf Veins: No evidence of thrombus. Normal compressibility and flow on color Doppler imaging. Superficial Great Saphenous Vein: No evidence of thrombus. Normal compressibility. Venous Reflux:  None. Other Findings:  None. IMPRESSION: 1. Examination is positive for nonocclusive thrombus within the proximal right femoral vein. 2. No evidence for left lower extremity deep venous thrombosis. Electronically Signed   By: Waddell Calk M.D.   On: 06/11/2024 06:39   CT Angio Chest PE W and/or Wo Contrast Result Date: 06/11/2024 CLINICAL DATA:  Shortness of breath and left-sided chest pain, known history of lung carcinoma EXAM: CT ANGIOGRAPHY CHEST WITH CONTRAST TECHNIQUE: Multidetector CT imaging of the chest was performed using the standard protocol during bolus administration of intravenous contrast. Multiplanar CT image reconstructions and MIPs were obtained to evaluate the vascular anatomy. RADIATION DOSE REDUCTION: This exam was performed  according to the departmental dose-optimization program which includes automated exposure control, adjustment of the mA and/or kV according to patient size and/or use of iterative reconstruction technique. CONTRAST:  75mL OMNIPAQUE  IOHEXOL  350 MG/ML SOLN COMPARISON:  05/24/2024 FINDINGS: Cardiovascular: Atherosclerotic calcifications of the thoracic aorta are noted. No aneurysmal dilatation or dissection is seen. The pulmonary artery shows a normal branching pattern bilaterally. Extensive pulmonary emboli are noted right greater than left. There are changes consistent with right heart strain with an RV/LV ratio of 1.5. Mediastinum/Nodes: Thoracic inlet is within normal limits. No hilar or mediastinal adenopathy is noted. The esophagus as visualized is within normal limits. Sliding-type hiatal hernia is seen. Lungs/Pleura: Small left effusion is noted. Scattered nodular densities are seen similar to that noted on prior exam consistent with metastatic disease. The largest index lesion in the left lower lobe measures approximately 17 mm in greatest dimension stable from the prior study. Similar findings are noted within the right lung although a large pleural effusion is noted increased when compared with the prior exam. Index lesion in the right lower lobe measures 17 mm also stable from the prior exam. Upper Abdomen: Visualized upper abdomen shows stable cyst in the left lobe of the liver. Musculoskeletal: Degenerative changes of the thoracic spine are noted. No compression deformity is noted. Review of the MIP images confirms the above findings. IMPRESSION: Extensive bilateral pulmonary emboli with evidence of right heart strain. The RV/LV ratio measures 1.5. Scattered pulmonary nodules again identified consistent with metastatic disease. The overall appearance is similar to that seen on the recent exam. Aortic Atherosclerosis (ICD10-I70.0). Critical Value/emergent results were called by telephone at the time of  interpretation on 06/11/2024 at 1:24 am to Dr. KEVIN PADUCHOWSKI , who verbally acknowledged these results. Electronically Signed   By: Oneil Devonshire M.D.   On: 06/11/2024 01:26   DG Chest Port 1 View Result Date: 06/10/2024 CLINICAL DATA:  Shortness of breath EXAM: PORTABLE CHEST 1 VIEW COMPARISON:  Chest x-ray 05/24/2024.  CT of the chest 05/24/2024. FINDINGS: Heart is enlarged. There are small bilateral pleural effusions. There scattered bilateral patchy airspace opacities in both lungs increasing in the left upper lobe. Scattered nodular densities are grossly unchanged. No pneumothorax or acute fracture identified. IMPRESSION: 1. Scattered bilateral patchy airspace opacities in both lungs increasing in the left upper lobe. Findings may represent edema or infection. 2. Small bilateral pleural effusions. 3. Stable scattered nodular densities. 4. Cardiomegaly. Electronically Signed   By: Greig Pique M.D.   On: 06/10/2024 23:14    PERFORMANCE STATUS (ECOG) : 1 - Symptomatic but completely ambulatory  Review of Systems Unless otherwise noted, a  complete review of systems is negative.  Physical Exam General: NAD Cardiovascular: regular rate and rhythm Pulmonary: clear ant fields Abdomen: soft, nontender, + bowel sounds GU: no suprapubic tenderness Extremities: no edema, no joint deformities Skin: no rashes Neurological: Weakness but otherwise nonfocal  IMPRESSION: Patient is known to me from her recent hospitalization.  At that time, she was hospitalized with large saddle PE requiring thrombectomy.  She was discharged to rehab on O2 and is now readmitted with multifocal pneumonia.  Patient currently in ER.  Patient endorses ongoing dyspnea.  Patient and son confirm DNR/DNI.  I met privately with patient's son.  Son tells me that patient was doing well at rehab and improving functionally.  However, patient was not able to come to the cancer center recently to meet with Dr. Jacobo due to  poor performance status.  At that time, possible treatment with immunotherapy was discussed but was dependent upon performance status improving.  I spoke with son today about this hospitalization potentially being a further setback to patient receiving cancer treatment.  Prognosis appears to be guarded and patient certainly could further decline from her pneumonia.  We discussed the possible option of hospice but will wait to see how patient does going forward.  PLAN: - Continue current scope of treatment - Will plan outpatient follow-up  Case and plan discussed with Dr. Jacobo   Time Total: 30 minutes  Visit consisted of counseling and education dealing with the complex and emotionally intense issues of symptom management and palliative care in the setting of serious and potentially life-threatening illness.Greater than 50%  of this time was spent counseling and coordinating care related to the above assessment and plan.  Signed by: Fonda Mower, PhD, NP-C

## 2024-07-02 ENCOUNTER — Inpatient Hospital Stay

## 2024-07-02 ENCOUNTER — Inpatient Hospital Stay: Admit: 2024-07-02 | Discharge: 2024-07-02 | Disposition: A | Discharge status: Home / Self Care

## 2024-07-02 DIAGNOSIS — J189 Pneumonia, unspecified organism: Secondary | ICD-10-CM | POA: Diagnosis not present

## 2024-07-02 DIAGNOSIS — A419 Sepsis, unspecified organism: Secondary | ICD-10-CM | POA: Diagnosis not present

## 2024-07-02 DIAGNOSIS — Z515 Encounter for palliative care: Secondary | ICD-10-CM | POA: Diagnosis not present

## 2024-07-02 DIAGNOSIS — J9601 Acute respiratory failure with hypoxia: Secondary | ICD-10-CM | POA: Diagnosis not present

## 2024-07-02 DIAGNOSIS — J90 Pleural effusion, not elsewhere classified: Secondary | ICD-10-CM | POA: Diagnosis not present

## 2024-07-02 LAB — MAGNESIUM: Magnesium: 2.1 mg/dL (ref 1.7–2.4)

## 2024-07-02 LAB — CBC
HCT: 30.8 % — ABNORMAL LOW (ref 36.0–46.0)
Hemoglobin: 9.6 g/dL — ABNORMAL LOW (ref 12.0–15.0)
MCH: 29.1 pg (ref 26.0–34.0)
MCHC: 31.2 g/dL (ref 30.0–36.0)
MCV: 93.3 fL (ref 80.0–100.0)
Platelets: 207 K/uL (ref 150–400)
RBC: 3.3 MIL/uL — ABNORMAL LOW (ref 3.87–5.11)
RDW: 14.6 % (ref 11.5–15.5)
WBC: 17 K/uL — ABNORMAL HIGH (ref 4.0–10.5)
nRBC: 0 % (ref 0.0–0.2)

## 2024-07-02 LAB — BASIC METABOLIC PANEL WITH GFR
Anion gap: 10 (ref 5–15)
BUN: 46 mg/dL — ABNORMAL HIGH (ref 8–23)
CO2: 27 mmol/L (ref 22–32)
Calcium: 9 mg/dL (ref 8.9–10.3)
Chloride: 105 mmol/L (ref 98–111)
Creatinine, Ser: 1.49 mg/dL — ABNORMAL HIGH (ref 0.44–1.00)
GFR, Estimated: 34 mL/min — ABNORMAL LOW (ref >60)
Glucose, Bld: 165 mg/dL — ABNORMAL HIGH (ref 70–99)
Potassium: 4 mmol/L (ref 3.5–5.1)
Sodium: 142 mmol/L (ref 135–145)

## 2024-07-02 MED ORDER — POLYETHYLENE GLYCOL 3350 17 G PO PACK
34.0000 g | PACK | Freq: Two times a day (BID) | ORAL | Status: AC
  Administered 2024-07-02: 34 g via ORAL
  Administered 2024-07-02: 17 g via ORAL
  Administered 2024-07-03 – 2024-07-04 (×3): 34 g via ORAL
  Filled 2024-07-02 (×6): qty 2

## 2024-07-02 MED ORDER — FUROSEMIDE 10 MG/ML IJ SOLN
60.0000 mg | Freq: Once | INTRAMUSCULAR | Status: AC
Start: 2024-07-02 — End: 2024-07-02
  Administered 2024-07-02: 60 mg via INTRAVENOUS
  Filled 2024-07-02: qty 8

## 2024-07-02 MED ORDER — TECHNETIUM TO 99M ALBUMIN AGGREGATED
4.0000 | Freq: Once | INTRAVENOUS | Status: AC | PRN
  Administered 2024-07-02: 4.33 via INTRAVENOUS

## 2024-07-02 MED ORDER — LORAZEPAM 0.5 MG PO TABS
0.5000 mg | ORAL_TABLET | Freq: Four times a day (QID) | ORAL | Status: DC | PRN
  Administered 2024-07-02 – 2024-07-03 (×4): 0.5 mg via ORAL
  Filled 2024-07-02 (×4): qty 1

## 2024-07-02 NOTE — ED Notes (Signed)
 Went with patient to VQ scan. RT bedside for VQ scan.

## 2024-07-02 NOTE — Therapy (Signed)
 Pt on BIPAP slightly labored and anxious. Saturations 96%. Pt transitioned to Heated high flow of 15 lpm with 31% oxygen.  Pt is tolerating well.  Saturation 100%.  WOB improved.

## 2024-07-02 NOTE — Progress Notes (Signed)
 OT Cancellation Note  Patient Details Name: Julie Mcbride MRN: [id number] DOB: Aug 21, 1936   Cancelled Treatment:    Reason Eval/Treat Not Completed: Other (comment). Orders received and chart reviewed. PT/OT co-eval attempted but pt politely declining at this time as she just received her breakfast tray. Pt and family requesting to return in the afternoon to re-attempt.   Jaylin Roundy E Cathi Hazan 07/02/2024, 11:30 AM

## 2024-07-02 NOTE — Consult Note (Signed)
 Consultation Note Date: 07/02/2024 at 1430  Patient Name: Julie Mcbride  DOB: [birth year]  MRN: [id number]  Age / Sex: 88 y.o., female  PCP: Alla Amis, MD Referring Physician: Awanda City, MD  HPI/Patient Profile: 88 y.o. 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   Of note, patient is familiar to our service that she met with my colleague Josh yesterday, 8/4.  Patient is being treated for acute on chronic respiratory failure with hypoxia and hypercapnia, sepsis due to multifocal pneumonia, elevated troponin, bilateral pleural effusions, recent saddle PE s/p thrombectomy with right femoral DVT, stage IV lung cancer with mets to liver and Jones, CKD stage IIIa, right anterior stenosis s/p stent, HTN, generalized weakness with debility.  PMT was consulted to support patient and family with goals of care discussions given patient's widespread metastatic disease.   Clinical Assessment and Goals of Care: Extensive chart review completed prior to meeting patient including labs, vital signs, imaging, progress notes, orders, and available advanced directive documents from current and previous encounters. I then met with patient, her husband Jayson, and her son [name] at bedside in ED to discuss diagnosis prognosis, GOC, EOL wishes, disposition and options.  I introduced Palliative Medicine as specialized medical care for people living with serious illness. It focuses on providing relief from the symptoms and stress of a serious illness. The goal is to improve quality of life for both the patient and the  family. Family recalls meeting with my colleague Josh yesterday.   We discussed a brief life review of the patient.  Patient and her husband have been married for 66 years.  She worked for 33+ years with [employer].  She has 3 children, several grandchildren and great-grandchildren.  As far as functional and nutritional status patient states that she has been doing well in the past few weeks.  She shares manage understand how hard it has been for her over the last few weeks.  She shares she is tired and ready to be with the Jacquetta whenever he is ready taker.  Therapeutic silence, active listening, and emotional support provided discussed patient will need a better functional status to be better.    Discussed patient's current illness - PNA- and what it means in the context of her ongoing comorbidities-widespread metastatic disease with limited treatment options that would improve her QOL.  Quality versus quality of life, and acknowledging the limitations of once acting body discussed in detail.  Patient has multifocal pneumonia and respiratory distress which is giving her another medical setback.   I attempted to elicit values and goals important to the patient.  She speaks of having discussions with her family in the most recent weeks, sharing that she does not think she can do anything more and  she is ready to be with the Jacquetta when He is ready. She says she feels like she cannot do anything else to get any better and that she interacts just continues to get more weak.   During her visit, transport came to move patient for VQ scan.  Education provided on VQ scan and treatments based on these results.  Patient shares she does not wish to move forward with the scan as she is not wanting any further treatments.  However, patient's son and husband at bedside strongly encouraged her to get the scan.  Discussed that we usually test to treat but that patient has full autonomy over her medical decision  making.  She can move forward with VQ scan but decided to refuse treatment if that is in line with her wishes.  She was appreciative of our discussion.  She agreed to try this going on.  I shared my concern that patient seems prepared to .  However, her husband and son at bedside seem to want to continue with treatment.  I shared ongoing discussions are needed between patient and family to ensure patient's goals are kept aligned with what medical treatments are.  Family may not agree with what patient wants but they can standby and support her decisions that she is the one living through these treatments/tests/procedures.  Family was appreciative of our discussion.  PMT contact info given.  I plan to follow-up with patient and family tomorrow.  No adjustment to plan of care at this time.  Primary Decision Maker PATIENT  Physical Exam Vitals reviewed.  Constitutional:      General: She is not in acute distress.    Appearance: She is normal weight.  HENT:     Head: Normocephalic.     Mouth/Throat:     Mouth: Mucous membranes are moist.  Eyes:     Pupils: Pupils are equal, round, and reactive to light.  Cardiovascular:     Rate and Rhythm: Normal rate.     Pulses: Normal pulses.  Pulmonary:     Comments: SOB, conversational dyspnea Abdominal:     Palpations: Abdomen is soft.  Skin:    General: Skin is warm and dry.  Neurological:     Mental Status: She is alert and oriented to person, place, and time.  Psychiatric:        Mood and Affect: Mood normal.        Behavior: Behavior normal.        Thought Content: Thought content normal.        Judgment: Judgment normal.     Palliative Assessment/Data: 50%     Thank you for this consult. Palliative medicine will continue to follow and assist holistically.   Time Total: 75 minutes  Time spent includes: Detailed review of medical records (labs, imaging, vital signs), medically appropriate exam (mental status, respiratory,  cardiac, skin), discussed with treatment team, counseling and educating patient, family and staff, documenting clinical information, medication management and coordination of care.  Signed by: Lamarr Gunner, DNP, FNP-BC Palliative Medicine   Please contact Palliative Medicine Team providers via [facility] for questions and concerns.

## 2024-07-02 NOTE — Progress Notes (Signed)
 PT Cancellation Note  Patient Details Name: Julie Mcbride MRN: [id number] DOB: [birth year]   Cancelled Treatment:    Reason Eval/Treat Not Completed: Other (comment). Orders received and cahrt reviewed. PT/OT co-eval attempted but pt politely declining. Pt and family requesting to return in the afternoon to re-attempt.    Dorina HERO. Fairly IV, PT, DPT Physical Therapist- [facility] 07/02/2024, 10:32 AM

## 2024-07-02 NOTE — Consult Note (Signed)
 [facility] CLINIC CARDIOLOGY CONSULT NOTE       Patient ID: Julie Mcbride MRN: [id number] DOB/AGE: 30-Oct-1936 88 y.o.  Admit date: 06/30/2024 Referring Physician Dr. Awanda Primary Physician Alla Amis, MD Primary Cardiologist Dr. Florencio (2022) Reason for Consultation severe dyspnea  HPI: Julie Mcbride is a 88 y.o. female  from [facility] rehab with a past medical history of hypertension, hyperlipidemia, hx NSTEMI (2022) treated medically, stage IV lung cancer with mets to liver and bones, recent saddle PE s/p thrombectomy 05/2024 (on Eliquis ), CKG stage 3a, renal artery stenosis, dementia, obesity who presented to the ED on 06/30/2024 for worsening SOB associated with productive cough and diminished appetite. Cardiology was consulted for further evaluation.   Patient presented to the ED with worsening SOB from [facility] rehab. Work up in the ED notable for ABG with pH 7.23, pCO2 66, bicarb 27.6, Na 137, K 4.1, Cr 1.39, albumin  2.9, GFR 37, hgb 10.7, plts 309. Blood cultures x2 without growth from past 2 days. Lactate 1.3. Trops elevated and flat 71 > 190 > 630 > 760 > 720. EKG with sinus tachycardia with LBBB, rate 109 bpm with no acute ischemic changes. Has history of LBBB. BNP elevated at 790. CXR with bilateral pleural effusions, increased bilateral multifocal interstial opacities indicates pulmonary edema vs multifocal infection. Patient on BiPAP intermittently, given duonebs, solumedrol, started on antibiotics and given 2x doses of IV lasix  60 mg daily.   At the time of my evaluation this afternoon, patient with mild distress related to dyspnea on 3L O2 (baseline 2L). Patient's family at bedside. We discussed patients sxs in further detail. Patient states at [facility] she developed SOB and CXR was performed revealing pneumonia over the weekend. Patient states 1 week prior she was feeling great, doing well with rehab without chest pain or SOB. Patient denies any chest pain, palpitations, leg  swelling, lightheadedness or syncope. During admission BiPAP seems to improve patients respiratory status, but patient doesn't like the feeling of it.     Review of systems complete and found to be negative unless listed above    Past Medical History:  Diagnosis Date   Allergy    Anemia    Arthritis    Knees, hands   Basal cell carcinoma    back - removed   Bladder polyp    With previos hematuria   Diverticulitis    Dyspnea    Dyspnea on exertion    GERD (gastroesophageal reflux disease)    History of ETT    Myoview 7/10 EF 78%, no ischemia or infarction. Poor exercise tolerance (2'30, stopped due to fatigue). Reached 86% MPHR.   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 IVAR Mori   BACK SURGERY     BASAL CELL CARCINOMA EXCISION     back   BREAST BIOPSY Right 06/19/2017   Affirm Bx- Radial Scar   BREAST EXCISIONAL BIOPSY Right 1996   neg   BREAST LUMPECTOMY WITH NEEDLE LOCALIZATION Right 08/11/2017   Procedure: BREAST  LUMPECTOMY WITH NEEDLE LOCALIZATION;  Surgeon: Claudene Larinda Bolder, MD;  Location: [facility] ORS;  Service: General;  Laterality: Right;   CARPAL TUNNEL RELEASE Right    CATARACT EXTRACTION W/ INTRAOCULAR LENS  IMPLANT, BILATERAL     EXCISION OF BREAST BIOPSY Right 08/11/2017   excision of radial Scar   EYE SURGERY     JOINT REPLACEMENT Right    knee   KNEE ARTHROSCOPY Right 04/22/2016   Procedure: ARTHROSCOPY KNEE;  Surgeon: Helayne Glenn, MD;  Location: [facility];  Service: Orthopedics;  Laterality: Right;   knee replacement Right 01/11/2017   NASAL  SINUS SURGERY  06/05/14   Dr. Milissa   POSTERIOR CERVICAL LAMINECTOMY  2005   C5-7; plate and screws   PULMONARY THROMBECTOMY Bilateral 06/11/2024   Procedure: PULMONARY THROMBECTOMY;  Surgeon: Jama Cordella MATSU, MD;  Location: [facility] INVASIVE CV [facility];  Service: Cardiovascular;  Laterality: Bilateral;   RENAL ANGIOGRAPHY Left 04/20/2020   Procedure: RENAL ANGIOGRAPHY;  Surgeon: Marea Selinda RAMAN, MD;  Location: [facility] INVASIVE CV [facility];  Service: Cardiovascular;  Laterality: Left;   TONSILLECTOMY     VIDEO BRONCHOSCOPY WITH ENDOBRONCHIAL NAVIGATION N/A 05/24/2024   Procedure: VIDEO BRONCHOSCOPY WITH ENDOBRONCHIAL NAVIGATION;  Surgeon: Parris Manna, MD;  Location: [facility] OR[facility];  Service: Thoracic;  Laterality: N/A;   VIDEO BRONCHOSCOPY WITH ENDOBRONCHIAL ULTRASOUND N/A 05/24/2024   Procedure: BRONCHOSCOPY, WITH EBUS;  Surgeon: Parris Manna, MD;  Location: [facility] OR[facility];  Service: Thoracic;  Laterality: N/A;    (Not in a hospital admission)  Social History   Socioeconomic History   Marital status: Married    Spouse name: Not on file   Number of children: Not on file   Years of education: Not on file   Highest education level: Not on file  Occupational History   Occupation: Retired  Tobacco Use   Smoking status: Never   Smokeless tobacco: Never  Vaping Use   Vaping status: Never Used  Substance and Sexual Activity   Alcohol use: No   Drug use: No   Sexual activity: Not on file  Other Topics Concern   Not on file  Social History Narrative   Lives with husband (married)   Social Drivers of Health   Financial Resource Strain: Low Risk  (05/15/2024)   Received from [facility]   Overall Financial Resource Strain (CARDIA)    Difficulty of Paying Living Expenses: Not hard at all  Food Insecurity: No Food Insecurity (07/01/2024)   Hunger Vital Sign    Worried About Running Out of Food in the Last Year: Never true    Ran Out of Food in the Last Year: Never true  Transportation  Needs: No Transportation Needs (07/01/2024)   PRAPARE - Administrator, Civil Service (Medical): No    Lack of Transportation (Non-Medical): No  Physical Activity: Not on file  Stress: Not on file  Social Connections: Socially Integrated (07/01/2024)   Social Connection and Isolation Panel    Frequency of Communication with Friends and Family: More than three times a week    Frequency of Social Gatherings with Friends and Family: More than three times a week    Attends Religious Services: More than 4 times per year    Active Member of [employer] or Organizations: Yes    Attends Banker Meetings: More than 4 times per year    Marital Status: Married  Catering manager Violence: Not At Risk (07/01/2024)   Humiliation, Afraid, Rape, and Kick questionnaire  Fear of Current or Ex-Partner: No    Emotionally Abused: No    Physically Abused: No    Sexually Abused: No    Family History  Problem Relation Age of Onset   Ovarian cancer Mother    Cancer Mother    Stroke Father        CVA   Breast cancer Daughter 65   Breast cancer Maternal Aunt 38   Breast cancer Maternal Aunt 90   Prostate cancer Neg Hx    Bladder Cancer Neg Hx    Kidney cancer Neg Hx      Vitals:   07/02/24 0700 07/02/24 0800 07/02/24 1010 07/02/24 1201  BP:   126/65   Pulse: 75 76 87   Resp: 13 16 (!) 22   Temp:    (!) 97.5 F (36.4 C)  TempSrc:    Axillary  SpO2: 98% 98% 97%   Height:        PHYSICAL EXAM General: chronically ill appearing elderly female, well nourished, in mild distress. HEENT: Normocephalic and atraumatic. Neck: No JVD.   Lungs: Normal respiratory effort on 3L. Diminished breath sounds bilaterally with rhonchi Heart: HRRR. Normal S1 and S2 without gallops or murmurs.  Abdomen: Non-distended appearing.  Msk: Normal strength and tone for age. Extremities: Warm and well perfused. No clubbing, cyanosis, edema.  Neuro: Alert and oriented X 3. Psych: Answers questions  appropriately.   Labs: Basic Metabolic Panel: Recent Labs    07/01/24 0348 07/02/24 0458  NA 141 142  K 3.9 4.0  CL 101 105  CO2 29 27  GLUCOSE 159* 165*  BUN 32* 46*  CREATININE 1.44* 1.49*  CALCIUM  9.2 9.0  MG 2.2 2.1   Liver Function Tests: Recent Labs    06/30/24 1849  AST 33  ALT 20  ALKPHOS 144*  BILITOT 0.7  PROT 6.1*  ALBUMIN  2.9*   No results for input(s): LIPASE, AMYLASE in the last 72 hours. CBC: Recent Labs    06/30/24 1849 07/01/24 0348 07/02/24 0458  WBC 19.3* 10.1 17.0*  NEUTROABS 12.7*  --   --   HGB 10.7* 9.5* 9.6*  HCT 34.1* 29.5* 30.8*  MCV 95.0 92.5 93.3  PLT 309 184 207   Cardiac Enzymes: Recent Labs    07/01/24 0127 07/01/24 0455 07/01/24 1013  TROPONINIHS 637* 764* 724*   BNP: Recent Labs    06/30/24 1849  BNP 796.9*   D-Dimer: No results for input(s): DDIMER in the last 72 hours. Hemoglobin A1C: No results for input(s): HGBA1C in the last 72 hours. Fasting Lipid Panel: No results for input(s): CHOL, HDL, LDLCALC, TRIG, CHOLHDL, LDLDIRECT in the last 72 hours. Thyroid Function Tests: No results for input(s): TSH, T4TOTAL, T3FREE, THYROIDAB in the last 72 hours.  Invalid input(s): FREET3 Anemia Panel: No results for input(s): VITAMINB12, FOLATE, FERRITIN, TIBC, IRON, RETICCTPCT in the last 72 hours.   Radiology: [facility] Chest Port 1 View Result Date: 06/30/2024 CLINICAL DATA:  Shortness of breath. EXAM: PORTABLE CHEST 1 VIEW COMPARISON:  06/14/2024. FINDINGS: The heart size and mediastinal contours are unchanged. Moderate right and small to moderate left pleural effusions, increased since the prior exam with associated bibasilar atelectasis. Increased bilateral multifocal interstitial opacities. No pneumothorax. Cervical fixation hardware. No acute osseous abnormality. IMPRESSION: 1. Moderate right and small to moderate left pleural effusions, increased since the prior exam with associated  bibasilar atelectasis. 2. Increased bilateral multifocal interstitial opacities could reflect pulmonary edema or multifocal infection. Electronically Signed   By: Harrietta  Lateef M.D.   On: 06/30/2024 19:34   MR BRAIN W WO CONTRAST Result Date: 06/16/2024 EXAM: MRI BRAIN WITH AND WITHOUT CONTRAST 06/16/2024 06:30:00 PM TECHNIQUE: Multiplanar multisequence MRI of the head/brain was performed with and without the administration of intravenous contrast. COMPARISON: 06/07/2023 CLINICAL HISTORY: Cancer staging. Lung CA. Eval for mets. FINDINGS: BRAIN AND VENTRICLES: No acute infarct. No acute intracranial hemorrhage. No mass effect or midline shift. No hydrocephalus. The sella is unremarkable. Normal flow voids. No mass or abnormal enhancement. Multifocal hyperintense T2-weighted signal within the cerebral white matter, most commonly due to chronic small vessel disease. Generalized volume loss. 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 ORBITS: No acute abnormality. SINUSES: No acute abnormality. BONES AND SOFT TISSUES: Normal bone marrow signal and enhancement. No acute soft tissue abnormality. IMPRESSION: 1. No acute intracranial abnormality. 2. Multifocal hyperintense T2-weighted signal within the cerebral white matter, most commonly due to chronic small vessel disease. 3. Generalized volume loss. 4. 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 Electronically signed by: Franky Stanford MD 06/16/2024 08:35 PM EDT RP Workstation: [id number]   DG Chest Port 1 View Result Date: 06/14/2024 CLINICAL DATA:  Cough and breathing difficulty EXAM: PORTABLE CHEST 1 VIEW COMPARISON:  Chest radiograph dated 06/10/2024 FINDINGS: Low lung volumes with bronchovascular crowding. Increased patchy bibasilar opacities and mild interstitial opacities. Similar trace bilateral pleural effusions. No pneumothorax. Similar mildly enlarged cardiomediastinal silhouette. Cervical spinal fixation hardware appears intact.  IMPRESSION: 1. Increased patchy bibasilar opacities and mild interstitial opacities, which may represent pulmonary edema or multifocal infection. 2. Similar trace bilateral pleural effusions. Electronically Signed   By: Limin  Xu M.D.   On: 06/14/2024 13:41   DG HIP UNILAT WITH PELVIS 2-3 VIEWS RIGHT Result Date: 06/14/2024 CLINICAL DATA:  [id number] Hip pain [id number] EXAM: DG HIP (WITH OR WITHOUT PELVIS) 2-3V RIGHT COMPARISON:  March 09, 2023 FINDINGS: No evidence of pelvic fracture or diastasis.No acute hip fracture or dislocation.Moderate joint space loss of the left hip. Degenerative disc disease of the visualized lumbar spine.Soft tissues are unremarkable. IMPRESSION: 1. No acute fracture, pelvic bone diastasis, or dislocation. 2. Moderate osteoarthritis of the left hip. Electronically Signed   By: Rogelia Myers M.D.   On: 06/14/2024 13:20   ECHOCARDIOGRAM COMPLETE Result Date: 06/11/2024    ECHOCARDIOGRAM REPORT   Patient Name:   AHNNA DUNGAN Date of Exam: 06/11/2024 Medical Rec #:  [id number]        Height:       65.0 in Accession #:    [id number]       Weight:       210.9 lb Date of Birth:  [birth year]       BSA:          2.023 m Patient Age:    87 years         BP:           113/66 mmHg Patient Gender: F                HR:           91 bpm. Exam Location:  [facility] Procedure: 2D Echo, Cardiac Doppler and Color Doppler (Both Spectral and Color            Flow Doppler were utilized during procedure). Indications:     pulmonary embolus I26.09  History:         Patient has prior history of Echocardiogram examinations, most  recent 12/26/2020. Previous Myocardial Infarction,                  Signs/Symptoms:Dyspnea; Risk Factors:Hypertension.  Sonographer:     Christopher Furnace Referring Phys:  [id number] JAN A MANSY Diagnosing Phys: Lonni Hanson MD IMPRESSIONS  1. Left ventricular ejection fraction, by estimation, is 65 to 70%. The left ventricle has normal function. The left ventricle has no regional  wall motion abnormalities. There is moderate left ventricular hypertrophy. Left ventricular diastolic parameters are indeterminate. There is the interventricular septum is flattened in diastole ('D' shaped left ventricle), consistent with right ventricular volume overload.  2. Pulmonary artery pressure is mildly elevated (RVSP 35 mmHg plus central venous/right atrial pressure). Right ventricular systolic function is moderately reduced. The right ventricular size is moderately enlarged.  3. The mitral valve is degenerative. Trivial mitral valve regurgitation. No evidence of mitral stenosis.  4. The aortic valve is tricuspid. Aortic valve regurgitation is not visualized. No aortic stenosis is present. FINDINGS  Left Ventricle: Left ventricular ejection fraction, by estimation, is 65 to 70%. The left ventricle has normal function. The left ventricle has no regional wall motion abnormalities. The left ventricular internal cavity size was normal in size. There is  moderate left ventricular hypertrophy. The interventricular septum is flattened in diastole ('D' shaped left ventricle), consistent with right ventricular volume overload. Left ventricular diastolic parameters are indeterminate. Right Ventricle: Pulmonary artery pressure is mildly elevated (RVSP 35 mmHg plus central venous/right atrial pressure). The right ventricular size is moderately enlarged. No increase in right ventricular wall thickness. Right ventricular systolic function is moderately reduced. Left Atrium: Left atrial size was normal in size. Right Atrium: Right atrial size was normal in size. Pericardium: There is no evidence of pericardial effusion. Mitral Valve: The mitral valve is degenerative in appearance. There is mild thickening of the mitral valve leaflet(s). Mild mitral annular calcification. Trivial mitral valve regurgitation. No evidence of mitral valve stenosis. Tricuspid Valve: The tricuspid valve is normal in structure. Tricuspid valve  regurgitation is mild. Aortic Valve: The aortic valve is tricuspid. Aortic valve regurgitation is not visualized. No aortic stenosis is present. Aortic valve mean gradient measures 3.0 mmHg. Aortic valve peak gradient measures 5.2 mmHg. Aortic valve area, by VTI measures 2.53 cm. Pulmonic Valve: The pulmonic valve was not well visualized. Pulmonic valve regurgitation is not visualized. No evidence of pulmonic stenosis. Aorta: The aortic root is normal in size and structure. Pulmonary Artery: The pulmonary artery is not well seen. Venous: The inferior vena cava was not well visualized. IAS/Shunts: The interatrial septum was not well visualized.  LEFT VENTRICLE PLAX 2D LVIDd:         3.30 cm   Diastology LVIDs:         2.20 cm   LV e' medial:    4.79 cm/s LV PW:         1.20 cm   LV E/e' medial:  15.6 LV IVS:        1.38 cm   LV e' lateral:   11.50 cm/s LVOT diam:     2.00 cm   LV E/e' lateral: 6.5 LV SV:         44 LV SV Index:   22 LVOT Area:     3.14 cm  RIGHT VENTRICLE RV Basal diam:  4.00 cm RV Mid diam:    4.00 cm LEFT ATRIUM             Index  RIGHT ATRIUM           Index LA diam:        3.00 cm 1.48 cm/m   RA Area:     17.90 cm LA Vol (A2C):   42.0 ml 20.76 ml/m  RA Volume:   51.30 ml  25.36 ml/m LA Vol (A4C):   18.7 ml 9.24 ml/m LA Biplane Vol: 29.2 ml 14.43 ml/m  AORTIC VALVE AV Area (Vmax):    2.51 cm AV Area (Vmean):   2.20 cm AV Area (VTI):     2.53 cm AV Vmax:           114.00 cm/s AV Vmean:          88.200 cm/s AV VTI:            0.174 m AV Peak Grad:      5.2 mmHg AV Mean Grad:      3.0 mmHg LVOT Vmax:         91.00 cm/s LVOT Vmean:        61.700 cm/s LVOT VTI:          0.140 m LVOT/AV VTI ratio: 0.80  AORTA Ao Root diam: 3.20 cm MITRAL VALVE                TRICUSPID VALVE MV Area (PHT): 5.97 cm     TR Peak grad:   34.8 mmHg MV Decel Time: 127 msec     TR Vmax:        295.00 cm/s MV E velocity: 74.90 cm/s MV A velocity: 125.00 cm/s  SHUNTS MV E/A ratio:  0.60         Systemic VTI:   0.14 m                             Systemic Diam: 2.00 cm Lonni Hanson MD Electronically signed by Lonni Hanson MD Signature Date/Time: 06/11/2024/3:14:06 PM    Final    PERIPHERAL VASCULAR CATHETERIZATION Result Date: 06/11/2024 See surgical note for result.  US  Venous Img Lower Bilateral (DVT) Result Date: 06/11/2024 CLINICAL DATA:  Patient with pulmonary embolism.  Evaluate for DVT. EXAM: BILATERAL LOWER EXTREMITY VENOUS DOPPLER ULTRASOUND TECHNIQUE: Gray-scale sonography with graded compression, as well as color Doppler and duplex ultrasound were performed to evaluate the lower extremity deep venous systems from the level of the common femoral vein and including the common femoral, femoral, profunda femoral, popliteal and calf veins including the posterior tibial, peroneal and gastrocnemius veins when visible. The superficial great saphenous vein was also interrogated. Spectral Doppler was utilized to evaluate flow at rest and with distal augmentation maneuvers in the common femoral, femoral and popliteal veins. COMPARISON:  None Available. FINDINGS: RIGHT LOWER EXTREMITY Common Femoral Vein: No evidence of thrombus. Normal compressibility, respiratory phasicity and response to augmentation. Saphenofemoral Junction: No evidence of thrombus. Normal compressibility and flow on color Doppler imaging. Profunda Femoral Vein: No evidence of thrombus. Normal compressibility and flow on color Doppler imaging. Femoral Vein: Examination is positive for nonocclusive thrombus within the proximal right femoral vein. Popliteal Vein: No evidence of thrombus. Normal compressibility, respiratory phasicity and response to augmentation. Calf Veins: No evidence of thrombus. Normal compressibility and flow on color Doppler imaging. Superficial Great Saphenous Vein: No evidence of thrombus. Normal compressibility. Venous Reflux:  None. Other Findings:  None. LEFT LOWER EXTREMITY Common Femoral Vein: No evidence of  thrombus. Normal compressibility, respiratory phasicity and response to augmentation. Saphenofemoral  Junction: No evidence of thrombus. Normal compressibility and flow on color Doppler imaging. Profunda Femoral Vein: No evidence of thrombus. Normal compressibility and flow on color Doppler imaging. Femoral Vein: No evidence of thrombus. Normal compressibility, respiratory phasicity and response to augmentation. Popliteal Vein: No evidence of thrombus. Normal compressibility, respiratory phasicity and response to augmentation. Calf Veins: No evidence of thrombus. Normal compressibility and flow on color Doppler imaging. Superficial Great Saphenous Vein: No evidence of thrombus. Normal compressibility. Venous Reflux:  None. Other Findings:  None. IMPRESSION: 1. Examination is positive for nonocclusive thrombus within the proximal right femoral vein. 2. No evidence for left lower extremity deep venous thrombosis. Electronically Signed   By: Waddell Calk M.D.   On: 06/11/2024 06:39   CT Angio Chest PE W and/or Wo Contrast Result Date: 06/11/2024 CLINICAL DATA:  Shortness of breath and left-sided chest pain, known history of lung carcinoma EXAM: CT ANGIOGRAPHY CHEST WITH CONTRAST TECHNIQUE: Multidetector CT imaging of the chest was performed using the standard protocol during bolus administration of intravenous contrast. Multiplanar CT image reconstructions and MIPs were obtained to evaluate the vascular anatomy. RADIATION DOSE REDUCTION: This exam was performed according to the departmental dose-optimization program which includes automated exposure control, adjustment of the mA and/or kV according to patient size and/or use of iterative reconstruction technique. CONTRAST:  75mL OMNIPAQUE  IOHEXOL  350 MG/ML SOLN COMPARISON:  05/24/2024 FINDINGS: Cardiovascular: Atherosclerotic calcifications of the thoracic aorta are noted. No aneurysmal dilatation or dissection is seen. The pulmonary artery shows a normal branching  pattern bilaterally. Extensive pulmonary emboli are noted right greater than left. There are changes consistent with right heart strain with an RV/LV ratio of 1.5. Mediastinum/Nodes: Thoracic inlet is within normal limits. No hilar or mediastinal adenopathy is noted. The esophagus as visualized is within normal limits. Sliding-type hiatal hernia is seen. Lungs/Pleura: Small left effusion is noted. Scattered nodular densities are seen similar to that noted on prior exam consistent with metastatic disease. The largest index lesion in the left lower lobe measures approximately 17 mm in greatest dimension stable from the prior study. Similar findings are noted within the right lung although a large pleural effusion is noted increased when compared with the prior exam. Index lesion in the right lower lobe measures 17 mm also stable from the prior exam. Upper Abdomen: Visualized upper abdomen shows stable cyst in the left lobe of the liver. Musculoskeletal: Degenerative changes of the thoracic spine are noted. No compression deformity is noted. Review of the MIP images confirms the above findings. IMPRESSION: Extensive bilateral pulmonary emboli with evidence of right heart strain. The RV/LV ratio measures 1.5. Scattered pulmonary nodules again identified consistent with metastatic disease. The overall appearance is similar to that seen on the recent exam. Aortic Atherosclerosis (ICD10-I70.0). Critical Value/emergent results were called by telephone at the time of interpretation on 06/11/2024 at 1:24 am to Dr. KEVIN PADUCHOWSKI , who verbally acknowledged these results. Electronically Signed   By: Oneil Devonshire M.D.   On: 06/11/2024 01:26   DG Chest Port 1 View Result Date: 06/10/2024 CLINICAL DATA:  Shortness of breath EXAM: PORTABLE CHEST 1 VIEW COMPARISON:  Chest x-ray 05/24/2024.  CT of the chest 05/24/2024. FINDINGS: Heart is enlarged. There are small bilateral pleural effusions. There scattered bilateral patchy  airspace opacities in both lungs increasing in the left upper lobe. Scattered nodular densities are grossly unchanged. No pneumothorax or acute fracture identified. IMPRESSION: 1. Scattered bilateral patchy airspace opacities in both lungs increasing in the left upper lobe.  Findings may represent edema or infection. 2. Small bilateral pleural effusions. 3. Stable scattered nodular densities. 4. Cardiomegaly. Electronically Signed   By: Greig Pique M.D.   On: 06/10/2024 23:14    ECHO ordered.  06/11/2024  1. Left ventricular ejection fraction, by estimation, is 65 to 70%. The left ventricle has normal function. The left ventricle has no regional wall motion abnormalities. There is moderate left ventricular hypertrophy. Left ventricular diastolic parameters are indeterminate. There is the interventricular septum is flattened in diastole ('D' shaped left ventricle), consistent with right ventricular volume overload.   2. Pulmonary artery pressure is mildly elevated (RVSP 35 mmHg plus central venous/right atrial pressure). Right ventricular systolic function is moderately reduced. The right ventricular size is moderately enlarged.   3. The mitral valve is degenerative. Trivial mitral valve regurgitation. No evidence of mitral stenosis.   4. The aortic valve is tricuspid. Aortic valve regurgitation is not visualized. No aortic stenosis is present.    TELEMETRY reviewed by me 07/02/2024: sinus rhythm, rate 70s  EKG reviewed by me: sinus tachycardia with LBBB, rate 109 bpm with no acute ischemic changes.  Data reviewed by me 07/02/2024: last 24h vitals tele labs imaging I/O ED provider note, admission H&P.  Principal Problem:   Sepsis due to pneumonia [facility]) Active Problems:   Essential hypertension   CKD stage 3a, GFR 45-59 ml/min (HCC)   Ischemic cardiomyopathy   Adenocarcinoma, lung, unspecified laterality (HCC)   Class 2 obesity   Dyslipidemia   Restless leg syndrome   CAD (coronary artery  disease)   Dementia (HCC)   Pulmonary embolus (HCC)    ASSESSMENT AND PLAN:  LYSHA SCHRADE is a 88 y.o. female  from [facility] rehab with a past medical history of hypertension, hyperlipidemia, hx NSTEMI (2022) treated medically, stage IV lung cancer with mets to liver and bones, recent saddle PE s/p thrombectomy (on Eliquis ), CKG stage 3a, renal artery stenosis, dementia, obesity who presented to the ED on 06/30/2024 for worsening SOB associated with productive cough and diminished appetite. Cardiology was consulted for further evaluation.   # Demand ischemia # Hypertension # Hyperlipidemia Patient without chest pain. Trops elevated and flat 71 > 190 > 630 > 760 > 720. EKG with sinus tachycardia with LBBB, rate 109 bpm with no acute ischemic changes. Has history of LBBB. Lipid panel from 02/2024 within normal limits with LDL at 51. Echo from prior admission with pEF (65-70%), no RWMA, mod LVH, interventricular flattened in diastole, mild PASP in setting of pulmonary embolism.  -Echo ordered. -Continue home ASA 81 mg and atorvastatin  10 mg daily.  -Continue home hydralazine  100 mg BID.  (Patient has documented allergy to ACEi/CCB).  -Elevated and flat troponin in setting of acute on chronic respiratory failure, sepsis due to PNA, severe lung cancer is most consistent with demand/supply mismatch and not ACS   # Sepsis due to multifocal pneumonia # Acute on chronic respiratory failure  # Stage IV lung cancer with mets to liver and bones # HX saddle PE s/p thrombectomy (05/2024) # CKD, stage 3a Patient with severe dyspnea.  CXR with bilateral pleural effusions, increased bilateral multifocal interstial opacities indicates pulmonary edema vs multifocal infection.  S/p duobnebs, solumedrol and IV lasix  60 mg x2. Patient intermittently on BiPAP.  -V/Q scan ordered. -On Eliquis  for hx pulmonary embolism -On Abx for PNA, management per primary team.  -Pulmonology consulted, appreciate  recommendations.  -Recommend palliative consult.   Patient is critically ill with metastatic cancer and multiple  comorbidities. Prognosis is guarded.   This patient's plan of care was discussed and created with Dr. Florencio and he is in agreement.  Signed: Dorene Comfort, PA-C  07/02/2024, 12:24 PM [facility]

## 2024-07-02 NOTE — Progress Notes (Signed)
 PROGRESS NOTE    Julie Mcbride  FMW:[id number] DOB: [birth year] DOA: 06/30/2024 PCP: Alla Amis, MD  [id number]  LOS: 2 days   Brief hospital course:   Assessment & Plan: Julie RIOLO is a 88 y.o. female with medical history significant of recently diagnosed stage Ivb adenocarcinoma of lung with mets to liver and bones, recent PE/DVT, CKD stage IIIa, renal artery stenosis, HTN, HLD, ischemic cardiomyopathy, dementia, obesity who presented to the ED from [facility] for evaluation of shortness of breath and hypoxia.  Per reports, pt's O2 sat was found low at 85% at SNF on her usual 2 L/min [place] O2 which she'd been started on during recent admission for PE (discharged 06/19/24).  Pt reports feeling short of breath, worsening past 2-3 days, with associated productive cough, diminished appetite.  She does report getting stronger with PT at rehab and plan was to d/c home on Saturday next weekend.    Acute on chronic respiratory failure with hypoxia and hypercapnia On 2L O2 at baseline --multifactorial, with lung cancer, pleural effusions, possible pulm edema vs multifocal infection. --pt has significant dyspnea.  No wheezing, no hx of COPD or asthma. --tx for PNA and pulm edema --started steroid to see if it will help --morphine  PRN for air hunger, ativan  PRN for anxiety related to dyspnea --BiPAP PRN or heated hf for increased work of breathing. --obtain RVP --carido consult today, per onc rec --pulm consult today  Sepsis due to multifocal pneumonia --tachycardia, tachypnea, leukocytosis  --leukocytosis initially resolved, but increased again, likely due to steroid use. --cont ceftriaxone  and azithro  Elevated troponin  - likely demand ischemia due to above.  Unlikely ACS.  Trop peaked around 700's. --carido consult today, per onc rec, due to persistent dyspnea   Bilateral pleural effusions  - moderate R, small-moderate L.  Also possible pulm edema --cont IV lasix  60  daily  --order thoracentesis   Recent saddle PE s/p thrombectomy / Right femoral DVT --Continue Eliquis   --V/Q scan, per pulm   Stage IV lung cancer  - mets to liver and bones --onc Dr. Jacobo and onc palliative care following   CKD stage IIIa  - stable, near baseline  Renal artery stenosis s/p stent --Monitor BMP --Continue ASA   HTN --cont bisoprolol , hydralazine    RLS --cont home Requip    Generalized weakness / debility --Recently d/c to SNF on 06/19/24 --PT/OT evaluations --TOC consult --Fall precautions   DVT prophylaxis: On:Eliquis  Code Status: DNR confirmed with pt, no intubation. Family Communication: son and husband updated at bedside today Level of care: Progressive Dispo:   The patient is from: home Anticipated d/c is to: SNF rehab Anticipated d/c date is: undetermined   Subjective and Interval History:  Pt continued to have persistent dyspnea despite all interventions.  O2 sats in mid 90's with just 3L O2.  Family reported more tremors.    Cardio consulted today per onc rec.  Also consulted pulm today.   Objective: Vitals:   07/02/24 1201 07/02/24 1330 07/02/24 1459 07/02/24 1554  BP:  (!) 124/56  130/68  Pulse:  85  87  Resp:  (!) 25  (!) 22  Temp: (!) 97.5 F (36.4 C)  97.6 F (36.4 C) 97.8 F (36.6 C)  TempSrc: Axillary  Oral Oral  SpO2:  98%  92%  Height:        Intake/Output Summary (Last 24 hours) at 07/02/2024 1653 Last data filed at 07/02/2024 1010 Gross per 24 hour  Intake --  Output 600 ml  Net -600 ml   There were no vitals filed for this visit.  Examination:   Constitutional: NAD, awake, but with eyes closing and dozing off HEENT: conjunctivae and lids normal, EOMI CV: No cyanosis.   RESP: increased RR, on 3L Neuro: II - XII grossly intact.     Data Reviewed: I have personally reviewed labs and imaging studies  Time spent: 50 minutes  Julie Haber, MD [facility] If 7PM-7AM, please contact  night-coverage 07/02/2024, 4:53 PM

## 2024-07-02 NOTE — TOC CM/SW Note (Signed)
..  Transition of Care [facility]) - Inpatient Brief Assessment   Patient Details  Name: Julie Mcbride MRN: [id number] Date of Birth: 02/26/1936  Transition of Care [facility]) CM/SW Contact:    Edsel DELENA Fischer, LCSW Phone Number: 07/02/2024, 4:48 PM   Clinical Narrative:  SW met with pt at bedside.  Pt husband and son were in the room as well. Pt arrived to ED from [facility].  Pt is currently on oxygen an appeared be SOB and coughed after drinking Ensure.  Family concern about pt and not sure what next steps are in treatment.  Son expressed that he was told that pt is waiting for a bed. SW reached out to nursing and MD regarding concerns. SW expressed to son and husband to reach out to pt daughter as well because MD has spoken with her in reference to pt treatment.   SW messaged Nurse Natalie Reiffer to come to room to speak with pt and family  SW messaged Dr. Ellouise Haber Pt stated that she is still having issues swallowing. Pt began to cough after drinking Ensure. Still appears to have SOB even though on Oxygen and appears tired/ exhausted. Family would like to know the plan for pt.    Transition of Care Asessment:

## 2024-07-02 NOTE — ED Notes (Signed)
 Pt feeling SOB. Breathing treatment given

## 2024-07-02 NOTE — Consult Note (Signed)
 PULMONOLOGY         Date: 07/02/2024,   MRN# [id number] Julie Mcbride 04-18-1936    Referring provider: Dr Awanda   CHIEF COMPLAINT:   Acute on chronic hypoxemic respiratory failure   HISTORY OF PRESENT ILLNESS   This is a very nice 88 yo F who has never smoked and was recently diagnosed with lung cancer. She also has a background of obesity, skin cancer, anemia, allergies, mild dementia and recently has been dcd to rehab from [facility].  She reports that after initiation of rehab she was initially doing well followed by sudden respiratory distress and worsening hypoxemia. She had CT PE done earlier last month with no clots.     PAST MEDICAL HISTORY   Past Medical History:  Diagnosis Date   Allergy    Anemia    Arthritis    Knees, hands   Basal cell carcinoma    back - removed   Bladder polyp    With previos hematuria   Diverticulitis    Dyspnea    Dyspnea on exertion    GERD (gastroesophageal reflux disease)    History of ETT    Myoview 7/10 EF 78%, no ischemia or infarction. Poor exercise tolerance (2'30, stopped due to fatigue). Reached 86% MPHR.   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 IVAR Mori   BACK SURGERY     BASAL CELL CARCINOMA EXCISION     back   BREAST BIOPSY Right 06/19/2017   Affirm Bx- Radial Scar   BREAST EXCISIONAL BIOPSY Right 1996   neg   BREAST LUMPECTOMY WITH NEEDLE LOCALIZATION Right 08/11/2017   Procedure: BREAST LUMPECTOMY WITH NEEDLE LOCALIZATION;  Surgeon: Claudene Larinda Bolder, MD;  Location: [facility] OR[facility];   Service: General;  Laterality: Right;   CARPAL TUNNEL RELEASE Right    CATARACT EXTRACTION W/ INTRAOCULAR LENS  IMPLANT, BILATERAL     EXCISION OF BREAST BIOPSY Right 08/11/2017   excision of radial Scar   EYE SURGERY     JOINT REPLACEMENT Right    knee   KNEE ARTHROSCOPY Right 04/22/2016   Procedure: ARTHROSCOPY KNEE;  Surgeon: Helayne Glenn, MD;  Location: [facility];  Service: Orthopedics;  Laterality: Right;   knee replacement Right 01/11/2017   NASAL SINUS SURGERY  06/05/14   Dr. Milissa   POSTERIOR CERVICAL LAMINECTOMY  2005   C5-7; plate and screws   PULMONARY THROMBECTOMY Bilateral 06/11/2024   Procedure: PULMONARY THROMBECTOMY;  Surgeon: Jama Cordella MATSU, MD;  Location: [facility];  Service: Cardiovascular;  Laterality: Bilateral;   RENAL ANGIOGRAPHY Left 04/20/2020   Procedure: RENAL ANGIOGRAPHY;  Surgeon: Marea Selinda RAMAN, MD;  Location: [facility] CV [facility];  Service: Cardiovascular;  Laterality: Left;   TONSILLECTOMY     VIDEO BRONCHOSCOPY WITH ENDOBRONCHIAL NAVIGATION N/A 05/24/2024   Procedure: VIDEO BRONCHOSCOPY WITH ENDOBRONCHIAL NAVIGATION;  Surgeon: Parris Manna, MD;  Location: [facility];  Service: Thoracic;  Laterality: N/A;   VIDEO  BRONCHOSCOPY WITH ENDOBRONCHIAL ULTRASOUND N/A 05/24/2024   Procedure: BRONCHOSCOPY, WITH EBUS;  Surgeon: Parris Manna, MD;  Location: [facility];  Service: Thoracic;  Laterality: N/A;     FAMILY HISTORY   Family History  Problem Relation Age of Onset   Ovarian cancer Mother    Cancer Mother    Stroke Father        CVA   Breast cancer Daughter 38   Breast cancer Maternal Aunt 71   Breast cancer Maternal Aunt 90   Prostate cancer Neg Hx    Bladder Cancer Neg Hx    Kidney cancer Neg Hx      SOCIAL HISTORY   Social History   Tobacco Use   Smoking status: Never   Smokeless tobacco: Never  Vaping Use   Vaping status: Never Used  Substance Use Topics   Alcohol use: No   Drug use: No     MEDICATIONS     Home Medication:  Current Outpatient Rx   Order #: [id number] Class: Historical Med   Order #: [id number] Class: Historical Med   Order #: [id number] Class: Historical Med   Order #: [id number] Class: No Print   Order #: [id number] Class: Historical Med   Order #: [id number] Class: No Print   Order #: [id number] Class: No Print   Order #: [id number] Class: Normal   Order #: [id number] Class: No Print   Order #: [id number] Class: Historical Med   Order #: [id number] Class: Historical Med   Order #: [id number] Class: Historical Med   Order #: [id number] Class: Normal   Order #: [id number] Class: Historical Med   Order #: [id number] Class: Historical Med   Order #: [id number] Class: No Print   Order #: [id number] Class: No Print   Order #: [id number] Class: Historical Med   Order #: [id number] Class: Normal   Order #: [id number] Class: Print   Order #: [id number] Class: Historical Med   Order #: [id number] Class: Historical Med   Order #: [id number] Class: Historical Med   Order #: [id number] Class: Historical Med    Current Medication:  Current Facility-Administered Medications:    acetaminophen  (TYLENOL ) tablet 1,000 mg, 1,000 mg, Oral, Q8H PRN, Fausto Sor A, DO   albuterol  (PROVENTIL ) (2.5 MG/3ML) 0.083% nebulizer solution 3 mL, 3 mL, Inhalation, Q6H PRN, Fausto Sor A, DO, 3 mL at 07/02/24 0839   apixaban  (ELIQUIS ) tablet 5 mg, 5 mg, Oral, BID, Fausto Sor A, DO, 5 mg at 07/02/24 1014   aspirin  EC tablet 81 mg, 81 mg, Oral, Daily, Fausto Sor A, DO, 81 mg at 07/01/24 1003   atorvastatin  (LIPITOR) tablet 10 mg, 10 mg, Oral, Daily, Fausto Sor A, DO, 10 mg at 07/02/24 0001   azithromycin  (ZITHROMAX ) 500 mg in sodium chloride  0.9 % 250 mL IVPB, 500 mg, Intravenous, Q24H, Fausto Sor LABOR, DO, Stopped at 07/02/24 0255   bisoprolol  (ZEBETA ) tablet 10 mg, 10 mg, Oral, Daily, Fausto Sor A, DO, 10 mg at 07/02/24 1014   cefTRIAXone  (ROCEPHIN ) 2 g in sodium chloride  0.9 % 100 mL IVPB, 2 g, Intravenous,  Q24H, Fausto Sor LABOR, DO, Stopped at 07/02/24 0139   dextromethorphan -guaiFENesin  (MUCINEX  DM) 30-600 MG per 12 hr tablet 1 tablet, 1 tablet, Oral, BID, Fausto Sor A, DO, 1 tablet at 07/02/24 0002   fluticasone  (FLONASE ) 50 MCG/ACT nasal spray 2 spray, 2 spray, Each Nare, PRN, Fausto Sor A, DO   guaiFENesin  (ROBITUSSIN) 100 MG/5ML liquid 5 mL, 5 mL, Oral, Q4H PRN, Fausto Sor A, DO   hydrALAZINE  (APRESOLINE ) tablet 100 mg, 100 mg, Oral, BID, Fausto Sor A, DO, 100 mg at 07/02/24 1014  lidocaine  (LIDODERM ) 5 % 1 patch, 1 patch, Transdermal, Q24H, Lenon Elsie HERO, [facility], 1 patch at 07/01/24 1004   loratadine  (CLARITIN ) tablet 10 mg, 10 mg, Oral, Daily, Fausto Sor A, DO, 10 mg at 07/02/24 0001   LORazepam  (ATIVAN ) tablet 0.5 mg, 0.5 mg, Oral, Q6H PRN, Awanda [place], MD, 0.5 mg at 07/02/24 1014   methocarbamol  (ROBAXIN ) tablet 500 mg, 500 mg, Oral, Q6H PRN, Fausto Sor A, DO, 500 mg at 07/01/24 1004   methylPREDNISolone  sodium succinate (SOLU-MEDROL ) 40 mg/mL injection 40 mg, 40 mg, Intravenous, BID, Awanda [place], MD, 40 mg at 07/02/24 1001   morphine  CONCENTRATE 10 mg / 0.5 ml oral solution 10 mg, 10 mg, Oral, Q4H PRN, Awanda [place], MD, 10 mg at 07/02/24 9160   oxyCODONE -acetaminophen  (PERCOCET/ROXICET) 5-325 MG per tablet 1 tablet, 1 tablet, Oral, Q4H PRN, Awanda [place], MD   polyethylene glycol (MIRALAX  / GLYCOLAX ) packet 34 g, 34 g, Oral, BID, Awanda [place], MD, 17 g at 07/02/24 1004   rOPINIRole  (REQUIP ) tablet 0.5 mg, 0.5 mg, Oral, TID, Fausto Sor A, DO, 0.5 mg at 07/02/24 1027  Current Outpatient Medications:    acetaminophen  (TYLENOL ) 500 MG tablet, Take 1,000 mg by mouth every 8 (eight) hours as needed., Disp: , Rfl:    ACETAMINOPHEN  PO, Take 650 mg by mouth every 8 (eight) hours., Disp: , Rfl:    Acidophilus Lactobacillus CAPS, Take 1 capsule by mouth daily., Disp: , Rfl:    albuterol  (PROVENTIL ) (2.5 MG/3ML) 0.083% nebulizer solution, Inhale 3 mLs into the lungs every 6  (six) hours as needed for wheezing or shortness of breath., Disp: , Rfl:    ALPRAZolam  (XANAX ) 0.25 MG tablet, Take 0.25 mg by mouth 3 (three) times daily as needed for anxiety., Disp: , Rfl:    apixaban  (ELIQUIS ) 5 MG TABS tablet, Take 1 tablet (5 mg total) by mouth 2 (two) times daily., Disp: , Rfl:    aspirin  EC 81 MG tablet, Take 1 tablet (81 mg total) by mouth daily. Swallow whole., Disp: , Rfl:    atorvastatin  (LIPITOR) 10 MG tablet, Take 1 tablet (10 mg total) by mouth daily., Disp: 30 tablet, Rfl: 11   bisoprolol  (ZEBETA ) 10 MG tablet, Take 1 tablet (10 mg total) by mouth daily., Disp: , Rfl:    cetirizine (ZYRTEC) 10 MG tablet, Take 10 mg by mouth as needed., Disp: , Rfl:    Cholecalciferol  (VITAMIN D ) 50 MCG (2000 UT) tablet, Take 4,000 Units by mouth daily. , Disp: , Rfl:    doxycycline (VIBRAMYCIN) 100 MG capsule, Take 100 mg by mouth 2 (two) times daily., Disp: , Rfl:    estradiol  (ESTRACE  VAGINAL) 0.1 MG/GM vaginal cream, Apply 0.5mg  (pea-sized amount)  just inside the vaginal introitus with a finger-tip on Monday, Wednesday and Friday nights., Disp: 30 g, Rfl: 12   fluticasone  (FLONASE ) 50 MCG/ACT nasal spray, Place 2 sprays into both nostrils as needed for allergies or rhinitis., Disp: , Rfl:    furosemide  (LASIX ) 20 MG tablet, Take 20 mg by mouth daily., Disp: , Rfl:    guaiFENesin  (ROBITUSSIN) 100 MG/5ML liquid, Take 5 mLs by mouth every 4 (four) hours as needed for cough or to loosen phlegm., Disp: , Rfl:    hydrALAZINE  (APRESOLINE ) 50 MG tablet, Take 2 tablets (100 mg total) by mouth in the morning and at bedtime. TAKE 2 TABLETS BY MOUTH EVERY MORNING TAKE ONE TABLET MID-DAY AND TAKE ONE TABLET BEFORE BED, Disp: , Rfl:    lidocaine  4 %, Place  1 patch onto the skin daily. REMOVE & DISCARD PATCH WITHIN 12 HOURS OR AS DIRECTED BY MD, Disp: , Rfl:    methocarbamol  (ROBAXIN ) 500 MG tablet, Take 1 tablet (500 mg total) by mouth every 6 (six) hours as needed for muscle spasms., Disp: 30  tablet, Rfl: 0   oxyCODONE -acetaminophen  (PERCOCET/ROXICET) 5-325 MG tablet, Take 1-2 tablets by mouth every 6 (six) hours as needed for moderate pain (pain score 4-6) or severe pain (pain score 7-10). (Patient taking differently: Take 1 tablet by mouth every 6 (six) hours as needed for moderate pain (pain score 4-6) or severe pain (pain score 7-10).), Disp: 12 tablet, Rfl: 0   polyethylene glycol (MIRALAX  / GLYCOLAX ) 17 g packet, Take 17 g by mouth 2 (two) times daily., Disp: , Rfl:    rOPINIRole  (REQUIP ) 0.25 MG tablet, Take 0.5 mg by mouth 3 (three) times daily., Disp: , Rfl:    senna-docusate (SENOKOT-S) 8.6-50 MG tablet, Take 1 tablet by mouth 2 (two) times daily., Disp: , Rfl:    triamcinolone  ointment (KENALOG ) 0.1 %, Apply topically 2 (two) times daily as needed., Disp: , Rfl:     ALLERGIES   Chlorhexidine , Cyclobenzaprine hcl, Etodolac, Lisinopril, Meloxicam, Metaxalone, Neomycin-bacitracin zn-polymyx, Nifedipine, Nitrofurantoin, Sulfamethoxazole-trimethoprim, Sulfonamide derivatives, Tizanidine, Vasotec [enalapril maleate], Ibuprofen, and Tape     REVIEW OF SYSTEMS    Review of Systems:  Gen:  Denies  fever, sweats, chills weigh loss  HEENT: Denies blurred vision, double vision, ear pain, eye pain, hearing loss, nose bleeds, sore throat Cardiac:  No dizziness, chest pain or heaviness, chest tightness,edema Resp:   reports dyspnea chronically  Gi: Denies swallowing difficulty, stomach pain, nausea or vomiting, diarrhea, constipation, bowel incontinence Gu:  Denies bladder incontinence, burning urine Ext:   Denies Joint pain, stiffness or swelling Skin: Denies  skin rash, easy bruising or bleeding or hives Endoc:  Denies polyuria, polydipsia , polyphagia or weight change Psych:   Denies depression, insomnia or hallucinations   Other:  All other systems negative   VS: BP 126/65   Pulse 87   Temp (!) 97.5 F (36.4 C) (Axillary)   Resp (!) 22   Ht 5' 6 (1.676 m)   SpO2  97%   BMI 34.87 kg/m      PHYSICAL EXAM    GENERAL:NAD, no fevers, chills, no weakness no fatigue HEAD: Normocephalic, atraumatic.  EYES: Pupils equal, round, reactive to light. Extraocular muscles intact. No scleral icterus.  MOUTH: Moist mucosal membrane. Dentition intact. No abscess noted.  EAR, NOSE, THROAT: Clear without exudates. No external lesions.  NECK: Supple. No thyromegaly. No nodules. No JVD.  PULMONARY: decreased breath sounds with mild rhonchi worse at bases bilaterally.  CARDIOVASCULAR: S1 and S2. Regular rate and rhythm. No murmurs, rubs, or gallops. No edema. Pedal pulses 2+ bilaterally.  GASTROINTESTINAL: Soft, nontender, nondistended. No masses. Positive bowel sounds. No hepatosplenomegaly.  MUSCULOSKELETAL: No swelling, clubbing, or edema. Range of motion full in all extremities.  NEUROLOGIC: Cranial nerves II through XII are intact. No gross focal neurological deficits. Sensation intact. Reflexes intact.  SKIN: No ulceration, lesions, rashes, or cyanosis. Skin warm and dry. Turgor intact.  PSYCHIATRIC: Mood, affect within normal limits. The patient is awake, alert and oriented x 3. Insight, judgment intact.       IMAGING   @IMAGES @   ASSESSMENT/PLAN   Acute on chronic hypoxemic respiratory failure             Patient is with metastatic cancer , high risk for  VTE. Patient is on eliquis  BID but can still develop PE. She has CKD and is not able to get CTPE but we can evaluate PE with Vq scan.  CXR with multi focal infiltrates.  She does have mild LE edema and concern for pulmonary congestion from fluid imbalance.           - patient is with labored breathing clinically.  She seems to have improved with BIPAP with reduced work of breathing.          - There is possible viral LRTI and RVP is in process         - she is currently treated with antimicrobial therapy for CAP with zithromax  and rocephin          - respiratory cultures are in process    Metastatic  cancer     - s/p oncology evaluation     - patient with poor prognosis , I will call palliative and hospice to speak with patient as she is with advanced age and cancer.         Thank you for allowing me to participate in the care of this patient.   Patient/Family are satisfied with care plan and all questions have been answered.    Provider disclosure: Patient with at least one acute or chronic illness or injury that poses a threat to life or bodily function and is being managed actively during this encounter.  All of the below services have been performed independently by signing provider:  review of prior documentation from internal and or external health records.  Review of previous and current lab results.  Interview and comprehensive assessment during patient visit today. Review of current and previous chest radiographs/CT scans. Discussion of management and test interpretation with health care team and patient/family.   This document was prepared using Dragon voice recognition software and may include unintentional dictation errors.     Donnell Beauchamp, M.D.  Division of Pulmonary & Critical Care Medicine

## 2024-07-02 NOTE — ED Notes (Signed)
 Pt still having trouble breathing. Per Dr. Awanda patient can be placed on Bipap. MD to reach out to pulmonary and cardiology team.

## 2024-07-03 ENCOUNTER — Inpatient Hospital Stay

## 2024-07-03 DIAGNOSIS — J189 Pneumonia, unspecified organism: Secondary | ICD-10-CM | POA: Diagnosis not present

## 2024-07-03 DIAGNOSIS — R0602 Shortness of breath: Secondary | ICD-10-CM | POA: Diagnosis not present

## 2024-07-03 DIAGNOSIS — Z9889 Other specified postprocedural states: Secondary | ICD-10-CM | POA: Diagnosis not present

## 2024-07-03 DIAGNOSIS — J90 Pleural effusion, not elsewhere classified: Secondary | ICD-10-CM | POA: Diagnosis not present

## 2024-07-03 DIAGNOSIS — J9601 Acute respiratory failure with hypoxia: Secondary | ICD-10-CM | POA: Diagnosis not present

## 2024-07-03 LAB — RESPIRATORY PANEL BY PCR

## 2024-07-03 LAB — ECHOCARDIOGRAM LIMITED
Height: 66 in
S' Lateral: 2.5 cm

## 2024-07-03 LAB — BODY FLUID CELL COUNT WITH DIFFERENTIAL
Eos, Fluid: 0 %
Lymphs, Fluid: 28 %
Monocyte-Macrophage-Serous Fluid: 43 %
Neutrophil Count, Fluid: 29 %
Total Nucleated Cell Count, Fluid: 2469 uL

## 2024-07-03 LAB — PATHOLOGIST SMEAR REVIEW

## 2024-07-03 LAB — CBC
HCT: 29.6 % — ABNORMAL LOW (ref 36.0–46.0)
Hemoglobin: 9.5 g/dL — ABNORMAL LOW (ref 12.0–15.0)
MCH: 29.6 pg (ref 26.0–34.0)
MCHC: 32.1 g/dL (ref 30.0–36.0)
MCV: 92.2 fL (ref 80.0–100.0)
Platelets: 187 K/uL (ref 150–400)
RBC: 3.21 MIL/uL — ABNORMAL LOW (ref 3.87–5.11)
RDW: 14.9 % (ref 11.5–15.5)
WBC: 16.1 K/uL — ABNORMAL HIGH (ref 4.0–10.5)
nRBC: 0 % (ref 0.0–0.2)

## 2024-07-03 LAB — LEGIONELLA PNEUMOPHILA SEROGP 1 UR AG: L. pneumophila Serogp 1 Ur Ag: NEGATIVE

## 2024-07-03 LAB — LACTATE DEHYDROGENASE, PLEURAL OR PERITONEAL FLUID: LD, Fluid: 165 U/L — ABNORMAL HIGH (ref 3–23)

## 2024-07-03 LAB — PROTEIN, PLEURAL OR PERITONEAL FLUID: Total protein, fluid: 3.4 g/dL

## 2024-07-03 LAB — GLUCOSE, CAPILLARY: Glucose-Capillary: 219 mg/dL — ABNORMAL HIGH (ref 70–99)

## 2024-07-03 MED ORDER — ALPRAZOLAM 0.5 MG PO TABS
0.5000 mg | ORAL_TABLET | Freq: Once | ORAL | Status: AC
  Administered 2024-07-03: 0.5 mg via ORAL
  Filled 2024-07-03: qty 1

## 2024-07-03 MED ORDER — LIDOCAINE HCL (PF) 1 % IJ SOLN
10.0000 mL | Freq: Once | INTRAMUSCULAR | Status: AC
  Administered 2024-07-03: 10 mL

## 2024-07-03 NOTE — Progress Notes (Signed)
 PULMONOLOGY         Date: 07/03/2024,   MRN# [id number] Julie Mcbride 07-Oct-1936    Referring provider: Dr Awanda   CHIEF COMPLAINT:   Acute on chronic hypoxemic respiratory failure   HISTORY OF PRESENT ILLNESS   This is a very nice 88 yo F who has never smoked and was recently diagnosed with lung cancer. She also has a background of obesity, skin cancer, anemia, allergies, mild dementia and recently has been dcd to rehab from [facility].  She reports that after initiation of rehab she was initially doing well followed by sudden respiratory distress and worsening hypoxemia. She had CT PE done earlier last month with no clots.    07/03/24- patient for thoracentesis today.  Julie Mcbride had VQ with notable bilateral large/moderate pleural effusions negative for PE.  She is working with palliative care and leaning towards hospice which I would support at this point. Family at bedside today.   PAST MEDICAL HISTORY   Past Medical History:  Diagnosis Date   Allergy    Anemia    Arthritis    Knees, hands   Basal cell carcinoma    back - removed   Bladder polyp    With previos hematuria   Diverticulitis    Dyspnea    Dyspnea on exertion    GERD (gastroesophageal reflux disease)    History of ETT    Myoview 7/10 EF 78%, no ischemia or infarction. Poor exercise tolerance (2'30, stopped due to fatigue). Reached 86% MPHR.   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 IVAR Mori   BACK SURGERY     BASAL CELL CARCINOMA EXCISION     back   BREAST BIOPSY Right  06/19/2017   Affirm Bx- Radial Scar   BREAST EXCISIONAL BIOPSY Right 1996   neg   BREAST LUMPECTOMY WITH NEEDLE LOCALIZATION Right 08/11/2017   Procedure: BREAST LUMPECTOMY WITH NEEDLE LOCALIZATION;  Surgeon: Claudene Larinda Bolder, MD;  Location: [facility];  Service: General;  Laterality: Right;   CARPAL TUNNEL RELEASE Right    CATARACT EXTRACTION W/ INTRAOCULAR LENS  IMPLANT, BILATERAL     EXCISION OF BREAST BIOPSY Right 08/11/2017   excision of radial Scar   EYE SURGERY     JOINT REPLACEMENT Right    knee   KNEE ARTHROSCOPY Right 04/22/2016   Procedure: ARTHROSCOPY KNEE;  Surgeon: Helayne Glenn, MD;  Location: [facility];  Service: Orthopedics;  Laterality: Right;   knee replacement Right 01/11/2017   NASAL SINUS SURGERY  06/05/14   Dr. Milissa   POSTERIOR CERVICAL LAMINECTOMY  2005   C5-7; plate and screws   PULMONARY THROMBECTOMY Bilateral 06/11/2024   Procedure: PULMONARY THROMBECTOMY;  Surgeon: Jama Cordella MATSU, MD;  Location: [facility];  Service: Cardiovascular;  Laterality: Bilateral;   RENAL ANGIOGRAPHY Left 04/20/2020   Procedure: RENAL ANGIOGRAPHY;  Surgeon: Marea Selinda RAMAN, MD;  Location: [facility];  Service: Cardiovascular;  Laterality:  Left;   TONSILLECTOMY     VIDEO BRONCHOSCOPY WITH ENDOBRONCHIAL NAVIGATION N/A 05/24/2024   Procedure: VIDEO BRONCHOSCOPY WITH ENDOBRONCHIAL NAVIGATION;  Surgeon: Parris Manna, MD;  Location: [facility];  Service: Thoracic;  Laterality: N/A;   VIDEO BRONCHOSCOPY WITH ENDOBRONCHIAL ULTRASOUND N/A 05/24/2024   Procedure: BRONCHOSCOPY, WITH EBUS;  Surgeon: Parris Manna, MD;  Location: [facility];  Service: Thoracic;  Laterality: N/A;     FAMILY HISTORY   Family History  Problem Relation Age of Onset   Ovarian cancer Mother    Cancer Mother    Stroke Father        CVA   Breast cancer Daughter 9   Breast cancer Maternal Aunt 60   Breast cancer Maternal Aunt 90   Prostate cancer Neg Hx    Bladder Cancer Neg Hx     Kidney cancer Neg Hx      SOCIAL HISTORY   Social History   Tobacco Use   Smoking status: Never   Smokeless tobacco: Never  Vaping Use   Vaping status: Never Used  Substance Use Topics   Alcohol use: No   Drug use: No     MEDICATIONS    Current Medication:  Current Facility-Administered Medications:    acetaminophen  (TYLENOL ) tablet 1,000 mg, 1,000 mg, Oral, Q8H PRN, Fausto Sor A, DO, 1,000 mg at 07/02/24 2336   albuterol  (PROVENTIL ) (2.5 MG/3ML) 0.083% nebulizer solution 3 mL, 3 mL, Inhalation, Q6H PRN, Fausto Sor A, DO, 3 mL at 07/02/24 1633   apixaban  (ELIQUIS ) tablet 5 mg, 5 mg, Oral, BID, Fausto Sor A, DO, 5 mg at 07/03/24 0953   aspirin  EC tablet 81 mg, 81 mg, Oral, Daily, Fausto Sor A, DO, 81 mg at 07/03/24 0951   atorvastatin  (LIPITOR) tablet 10 mg, 10 mg, Oral, Daily, Fausto Sor A, DO, 10 mg at 07/02/24 2124   azithromycin  (ZITHROMAX ) 500 mg in sodium chloride  0.9 % 250 mL IVPB, 500 mg, Intravenous, Q24H, Fausto Sor LABOR, DO, Last Rate: 250 mL/hr at 07/02/24 2218, 500 mg at 07/02/24 2218   bisoprolol  (ZEBETA ) tablet 10 mg, 10 mg, Oral, Daily, Fausto Sor A, DO, 10 mg at 07/03/24 9048   cefTRIAXone  (ROCEPHIN ) 2 g in sodium chloride  0.9 % 100 mL IVPB, 2 g, Intravenous, Q24H, Fausto Sor A, DO, Last Rate: 200 mL/hr at 07/02/24 2132, 2 g at 07/02/24 2132   dextromethorphan -guaiFENesin  (MUCINEX  DM) 30-600 MG per 12 hr tablet 1 tablet, 1 tablet, Oral, BID, Fausto Sor A, DO, 1 tablet at 07/03/24 9048   fluticasone  (FLONASE ) 50 MCG/ACT nasal spray 2 spray, 2 spray, Each Nare, PRN, Fausto Sor A, DO   guaiFENesin  (ROBITUSSIN) 100 MG/5ML liquid 5 mL, 5 mL, Oral, Q4H PRN, Fausto Sor A, DO   hydrALAZINE  (APRESOLINE ) tablet 100 mg, 100 mg, Oral, BID, Fausto Sor A, DO, 100 mg at 07/03/24 0953   lidocaine  (LIDODERM ) 5 % 1 patch, 1 patch, Transdermal, Q24H, Lenon Elsie HERO, RPH, 1 patch at 07/03/24 0900   loratadine   (CLARITIN ) tablet 10 mg, 10 mg, Oral, Daily, Fausto Sor A, DO, 10 mg at 07/02/24 2125   LORazepam  (ATIVAN ) tablet 0.5 mg, 0.5 mg, Oral, Q6H PRN, Awanda City, MD, 0.5 mg at 07/02/24 2336   methocarbamol  (ROBAXIN ) tablet 500 mg, 500 mg, Oral, Q6H PRN, Fausto Sor A, DO, 500 mg at 07/03/24 0331   methylPREDNISolone  sodium succinate (SOLU-MEDROL ) 40 mg/mL injection 40 mg, 40 mg, Intravenous, BID, Awanda [place], MD, 40 mg at 07/03/24 0953   morphine  CONCENTRATE 10 mg /  0.5 ml oral solution 10 mg, 10 mg, Oral, Q4H PRN, Awanda [place], MD, 10 mg at 07/02/24 9160   oxyCODONE -acetaminophen  (PERCOCET/ROXICET) 5-325 MG per tablet 1 tablet, 1 tablet, Oral, Q4H PRN, Awanda [place], MD, 1 tablet at 07/03/24 0330   polyethylene glycol (MIRALAX  / GLYCOLAX ) packet 34 g, 34 g, Oral, BID, Awanda [place], MD, 34 g at 07/03/24 9048   rOPINIRole  (REQUIP ) tablet 0.5 mg, 0.5 mg, Oral, TID, Fausto Sor A, DO, 0.5 mg at 07/03/24 9047    ALLERGIES   Chlorhexidine , Cyclobenzaprine hcl, Etodolac, Lisinopril, Meloxicam, Metaxalone, Neomycin-bacitracin zn-polymyx, Nifedipine, Nitrofurantoin, Sulfamethoxazole-trimethoprim, Sulfonamide derivatives, Tizanidine, Vasotec [enalapril maleate], Ibuprofen, and Tape     REVIEW OF SYSTEMS    Review of Systems:  Gen:  Denies  fever, sweats, chills weigh loss  HEENT: Denies blurred vision, double vision, ear pain, eye pain, hearing loss, nose bleeds, sore throat Cardiac:  No dizziness, chest pain or heaviness, chest tightness,edema Resp:   reports dyspnea chronically  Gi: Denies swallowing difficulty, stomach pain, nausea or vomiting, diarrhea, constipation, bowel incontinence Gu:  Denies bladder incontinence, burning urine Ext:   Denies Joint pain, stiffness or swelling Skin: Denies  skin rash, easy bruising or bleeding or hives Endoc:  Denies polyuria, polydipsia , polyphagia or weight change Psych:   Denies depression, insomnia or hallucinations   Other:  All other systems  negative   VS: BP (!) 156/74 (BP Location: Left Arm)   Pulse 81   Temp 97.6 F (36.4 C)   Resp 20   Ht 5' 6 (1.676 m)   SpO2 92%   BMI 34.87 kg/m      PHYSICAL EXAM    GENERAL:NAD, no fevers, chills, no weakness no fatigue HEAD: Normocephalic, atraumatic.  EYES: Pupils equal, round, reactive to light. Extraocular muscles intact. No scleral icterus.  MOUTH: Moist mucosal membrane. Dentition intact. No abscess noted.  EAR, NOSE, THROAT: Clear without exudates. No external lesions.  NECK: Supple. No thyromegaly. No nodules. No JVD.  PULMONARY: decreased breath sounds with mild rhonchi worse at bases bilaterally.  CARDIOVASCULAR: S1 and S2. Regular rate and rhythm. No murmurs, rubs, or gallops. No edema. Pedal pulses 2+ bilaterally.  GASTROINTESTINAL: Soft, nontender, nondistended. No masses. Positive bowel sounds. No hepatosplenomegaly.  MUSCULOSKELETAL: No swelling, clubbing, or edema. Range of motion full in all extremities.  NEUROLOGIC: Cranial nerves II through XII are intact. No gross focal neurological deficits. Sensation intact. Reflexes intact.  SKIN: No ulceration, lesions, rashes, or cyanosis. Skin warm and dry. Turgor intact.  PSYCHIATRIC: Mood, affect within normal limits. The patient is awake, alert and oriented x 3. Insight, judgment intact.       IMAGING     ASSESSMENT/PLAN   Acute on chronic hypoxemic respiratory failure             Patient is with metastatic cancer and recurrent pleural effusions bilaterally with compressive atelectasis.           - VQ is negative for PE          - s/p Thoracentesis , fluid studies inprocess          - she is currently treated with antimicrobial therapy for CAP with zithromax  and rocephin          - respiratory cultures are in process    Metastatic cancer     - s/p oncology evaluation     - patient with poor prognosis palliative care is working with her and she may transition to hospice  Thank you for  allowing me to participate in the care of this patient.   Patient/Family are satisfied with care plan and all questions have been answered.    Provider disclosure: Patient with at least one acute or chronic illness or injury that poses a threat to life or bodily function and is being managed actively during this encounter.  All of the below services have been performed independently by signing provider:  review of prior documentation from internal and or external health records.  Review of previous and current lab results.  Interview and comprehensive assessment during patient visit today. Review of current and previous chest radiographs/CT scans. Discussion of management and test interpretation with health care team and patient/family.   This document was prepared using Dragon voice recognition software and may include unintentional dictation errors.     Julie Mcbride, M.D.  Division of Pulmonary & Critical Care Medicine

## 2024-07-03 NOTE — Procedures (Signed)
 PROCEDURE SUMMARY:  Successful image-guided right-sided diagnostic and therapeutic thoracentesis. Yielded 1.4 liters of clear, dark amber pleural fluid. Patient tolerated procedure well. EBL: Zero No immediate complications.  Specimen was sent for labs. Post procedure CXR is pending.  Please see imaging section of Epic for full dictation.  Carlin DELENA Griffon PA-C 07/03/2024 12:53 PM

## 2024-07-03 NOTE — Progress Notes (Signed)
 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  Patient O2 was found to be 85% at SNF.  Patient was recently discharged 06/19/2024 for pulmonary embolism.  She reports she was getting stronger at rehab plan was to discharge home next weekend.  Subjective: This morning patient is on high flow nasal cannula.  She was able to work some with occupational therapy today. She remains very anxious.  She endorses she will not go back to SNF and would rather go home let God take her  Family members are at bedside  Objective: Vitals:   07/03/24 0841 07/03/24 1215 07/03/24 1235 07/03/24 1430  BP: (!) 156/74 (!) 145/65 (!) 139/56   Pulse: 81 87 89   Resp: 20     Temp: 97.6 F (36.4 C)     TempSrc:      SpO2: 92%   96%  Height:        Intake/Output Summary (Last 24 hours) at 07/03/2024 1500 Last data filed at 07/03/2024 1400 Gross per 24 hour  Intake 795.62 ml  Output --  Net 795.62 ml   There were no vitals filed for this visit.  Examination: General exam: Appears fatigued Respiratory system: No work of breathing, symmetric chest wall expansion, high flow nasal cannula in place Cardiovascular system: S1 & S2 heard, RRR.  Gastrointestinal system: Abdomen is nondistended, soft and nontender.  Neuro: Alert and oriented. Extremities: Symmetric, expected ROM Skin: No rashes, lesions Psychiatry: Easily tearful  Assessment & Plan:  Principal Problem:   Sepsis  due to pneumonia [facility]) Active Problems:   Dyslipidemia   Essential hypertension   Ischemic cardiomyopathy   CKD stage 3a, GFR 45-59 ml/min (HCC)   Adenocarcinoma, lung, unspecified laterality (HCC)   Class 2 obesity   Restless leg syndrome   CAD (coronary artery disease)   Dementia (HCC)   Pulmonary embolus (HCC)    Acute on chronic respiratory failure with hypoxia and hypercapnia - On baseline 2 L O2 - Current respiratory failure is multifactorial secondary to lung cancer, pleural effusions, pulmonary edema, multifocal infection - Continues to have significant dyspnea - Continue treatment for pneumonia and pulmonary edema though minimal improvement - 8/6 thoracentesis: 1.4 L clear dark amber fluid aspirated. - Is currently on steroids - Pulmonology and cardiology consulted - As needed morphine  for air hunger and Ativan  for anxiety - As needed BiPAP or heated high flow - Respiratory viral panel negative  Sepsis secondary to multifocal pneumonia - On arrival tachycardic, tachypneic, leukocytosis - Continue ceftriaxone  azithromycin   Elevated troponin - Likely demand ischemia due to the above.  Troponin peaked around 700s. - Cardiology consulted, unlikely ACS  Bilateral pleural effusions - Not improving with IV Lasix  - Thoracentesis today  Recent saddle pulmonary embolism status post thrombectomy Right femoral DVT - Continue Eliquis  - VQ scan negative for acute PE  Stage IV lung cancer - Mets to liver and bones - Oncology Dr. Jacobo and palliative care consulted  CKD stage IIIa - Stable near baseline  Renal artery stenosis  status post stent - Continue to monitor kidney function - Continue aspirin   Hypertension - Continue current meds, titrate as needed  Restless leg syndrome - Continue home dose Requip   Generalized weakness Debility - Recently discharged to skilled nursing facility - PT/OT.  Patient refusing to return to skilled nursing or rehab at  discharge.  Will likely require home health if not hospice - Fall precautions   DVT prophylaxis: Eliquis    Code Status: Limited: Do not attempt resuscitation (DNR) -DNR-LIMITED -Do Not Intubate/DNI  Disposition: Thoracentesis today.  Pending final decisions on dispo.  Patient and family considering hospice.  Consultants:  Treatment Team:  Consulting Physician: Parris Manna, MD  Procedures:    Antimicrobials:  Anti-infectives (From admission, onward)    Start     Dose/Rate Route Frequency Ordered Stop   07/01/24 2200  cefTRIAXone  (ROCEPHIN ) 2 g in sodium chloride  0.9 % 100 mL IVPB        2 g 200 mL/hr over 30 Minutes Intravenous Every 24 hours 06/30/24 2046 07/06/24 2159   07/01/24 2200  azithromycin  (ZITHROMAX ) 500 mg in sodium chloride  0.9 % 250 mL IVPB        500 mg 250 mL/hr over 60 Minutes Intravenous Every 24 hours 06/30/24 2046 07/06/24 2159   06/30/24 2030  azithromycin  (ZITHROMAX ) 500 mg in sodium chloride  0.9 % 250 mL IVPB        500 mg 250 mL/hr over 60 Minutes Intravenous  Once 06/30/24 2024 06/30/24 2205   06/30/24 2030  cefTRIAXone  (ROCEPHIN ) 2 g in sodium chloride  0.9 % 100 mL IVPB        2 g 200 mL/hr over 30 Minutes Intravenous  Once 06/30/24 2024 06/30/24 2102       Data Reviewed: I have personally reviewed following labs and imaging studies CBC: Recent Labs  Lab 06/30/24 1849 07/01/24 0348 07/02/24 0458 07/03/24 0531  WBC 19.3* 10.1 17.0* 16.1*  NEUTROABS 12.7*  --   --   --   HGB 10.7* 9.5* 9.6* 9.5*  HCT 34.1* 29.5* 30.8* 29.6*  MCV 95.0 92.5 93.3 92.2  PLT 309 184 207 187   Basic Metabolic Panel: Recent Labs  Lab 06/30/24 1849 07/01/24 0348 07/02/24 0458  NA 137 141 142  K 4.1 3.9 4.0  CL 98 101 105  CO2 26 29 27   GLUCOSE 268* 159* 165*  BUN 28* 32* 46*  CREATININE 1.39* 1.44* 1.49*  CALCIUM  9.1 9.2 9.0  MG  --  2.2 2.1   GFR: CrCl cannot be calculated (Unknown ideal weight.). Liver Function Tests: Recent Labs  Lab  06/30/24 1849  AST 33  ALT 20  ALKPHOS 144*  BILITOT 0.7  PROT 6.1*  ALBUMIN  2.9*   CBG: No results for input(s): GLUCAP in the last 168 hours.  Recent Results (from the past 240 hours)  Culture, blood (routine x 2)     Status: None (Preliminary result)   Collection Time: 06/30/24  6:50 PM   Specimen: BLOOD  Result Value Ref Range Status   Specimen Description BLOOD RIGHT ANTECUBITAL  Final   Special Requests   Final    BOTTLES DRAWN AEROBIC AND ANAEROBIC Blood Culture adequate volume   Culture   Final    NO GROWTH 3 DAYS Performed at [facility], [address] [place], [place] [address]    Report Status PENDING  Incomplete  Resp panel by RT-PCR (RSV, Flu A&B, Covid) Anterior Nasal Swab     Status: None   Collection Time:  06/30/24  6:55 PM   Specimen: Anterior Nasal Swab  Result Value Ref Range Status   SARS Coronavirus 2 by RT PCR NEGATIVE NEGATIVE Final    Comment: (NOTE) SARS-CoV-2 target nucleic acids are NOT DETECTED.  The SARS-CoV-2 RNA is generally detectable in upper respiratory specimens during the acute phase of infection. The lowest concentration of SARS-CoV-2 viral copies this assay can detect is 138 copies/mL. A negative result does not preclude SARS-Cov-2 infection and should not be used as the sole basis for treatment or other patient management decisions. A negative result may occur with  improper specimen collection/handling, submission of specimen other than nasopharyngeal swab, presence of viral mutation(s) within the areas targeted by this assay, and inadequate number of viral copies(<138 copies/mL). A negative result must be combined with clinical observations, patient history, and epidemiological information. The expected result is Negative.  Fact Sheet for Patients:  BloggerCourse.com  Fact Sheet for Healthcare Providers:  SeriousBroker.it  This test is no t yet approved or  cleared by the [place]  FDA and  has been authorized for detection and/or diagnosis of SARS-CoV-2 by FDA under an Emergency Use Authorization (EUA). This EUA will remain  in effect (meaning this test can be used) for the duration of the COVID-19 declaration under Section 564(b)(1) of the Act, 21 U.S.C.section 360bbb-3(b)(1), unless the authorization is terminated  or revoked sooner.       Influenza A by PCR NEGATIVE NEGATIVE Final   Influenza B by PCR NEGATIVE NEGATIVE Final    Comment: (NOTE) The Xpert Xpress SARS-CoV-2/FLU/RSV plus assay is intended as an aid in the diagnosis of influenza from Nasopharyngeal swab specimens and should not be used as a sole basis for treatment. Nasal washings and aspirates are unacceptable for Xpert Xpress SARS-CoV-2/FLU/RSV testing.  Fact Sheet for Patients: BloggerCourse.com  Fact Sheet for Healthcare Providers: SeriousBroker.it  This test is not yet approved or cleared by the [place]  FDA and has been authorized for detection and/or diagnosis of SARS-CoV-2 by FDA under an Emergency Use Authorization (EUA). This EUA will remain in effect (meaning this test can be used) for the duration of the COVID-19 declaration under Section 564(b)(1) of the Act, 21 U.S.C. section 360bbb-3(b)(1), unless the authorization is terminated or revoked.     Resp Syncytial Virus by PCR NEGATIVE NEGATIVE Final    Comment: (NOTE) Fact Sheet for Patients: BloggerCourse.com  Fact Sheet for Healthcare Providers: SeriousBroker.it  This test is not yet approved or cleared by the [place]  FDA and has been authorized for detection and/or diagnosis of SARS-CoV-2 by FDA under an Emergency Use Authorization (EUA). This EUA will remain in effect (meaning this test can be used) for the duration of the COVID-19 declaration under Section 564(b)(1) of the Act, 21  U.S.C. section 360bbb-3(b)(1), unless the authorization is terminated or revoked.  Performed at [facility], [address] [place], [place] [address]   Culture, blood (routine x 2)     Status: None (Preliminary result)   Collection Time: 06/30/24  6:55 PM   Specimen: BLOOD LEFT HAND  Result Value Ref Range Status   Specimen Description BLOOD LEFT HAND  Final   Special Requests   Final    BOTTLES DRAWN AEROBIC AND ANAEROBIC Blood Culture adequate volume   Culture   Final    NO GROWTH 3 DAYS Performed at [facility], [address] [place], [place] [address]    Report Status PENDING  Incomplete  Respiratory (~20 pathogens) panel by PCR  Status: None   Collection Time: 06/30/24  6:55 PM   Specimen: Nasopharyngeal Swab; Respiratory  Result Value Ref Range Status   Adenovirus NOT DETECTED NOT DETECTED Final   Coronavirus 229E NOT DETECTED NOT DETECTED Final    Comment: (NOTE) The Coronavirus on the Respiratory Panel, DOES NOT test for the novel  Coronavirus (2019 nCoV)    Coronavirus HKU1 NOT DETECTED NOT DETECTED Final   Coronavirus NL63 NOT DETECTED NOT DETECTED Final   Coronavirus OC43 NOT DETECTED NOT DETECTED Final   Metapneumovirus NOT DETECTED NOT DETECTED Final   Rhinovirus / Enterovirus NOT DETECTED NOT DETECTED Final   Influenza A NOT DETECTED NOT DETECTED Final   Influenza B NOT DETECTED NOT DETECTED Final   Parainfluenza Virus 1 NOT DETECTED NOT DETECTED Final   Parainfluenza Virus 2 NOT DETECTED NOT DETECTED Final   Parainfluenza Virus 3 NOT DETECTED NOT DETECTED Final   Parainfluenza Virus 4 NOT DETECTED NOT DETECTED Final   Respiratory Syncytial Virus NOT DETECTED NOT DETECTED Final   Bordetella pertussis NOT DETECTED NOT DETECTED Final   Bordetella Parapertussis NOT DETECTED NOT DETECTED Final   Chlamydophila pneumoniae NOT DETECTED NOT DETECTED Final   Mycoplasma pneumoniae NOT DETECTED NOT DETECTED Final    Comment: Performed at  [facility], [address] [place], [place] [address]  MRSA Next Gen by PCR, Nasal     Status: None   Collection Time: 07/01/24  3:48 AM   Specimen: Nasal Mucosa; Nasal Swab  Result Value Ref Range Status   MRSA by PCR Next Gen NOT DETECTED NOT DETECTED Final    Comment: (NOTE) The GeneXpert MRSA Assay (FDA approved for NASAL specimens only), is one component of a comprehensive MRSA colonization surveillance program. It is not intended to diagnose MRSA infection nor to guide or monitor treatment for MRSA infections. Test performance is not FDA approved in patients less than 42 years old. Performed at [facility], [address] [place], [place] [address]      Radiology Studies: [facility] Chest [place] 1 View Result Date: 07/03/2024 CLINICAL DATA:  Status post thoracentesis. EXAM: PORTABLE CHEST 1 VIEW COMPARISON:  07/02/2024 and CT chest 06/11/2024. FINDINGS: Patient's head obscures the superior mediastinum and medial apices. Heart is enlarged, stable. Bibasilar collapse/consolidation with bilateral pleural effusions, minimally decreased on the right, status post thoracentesis. No definite pneumothorax. IMPRESSION: 1. No definite pneumothorax status post right thoracentesis. 2. Bibasilar collapse/consolidation with moderate bilateral pleural effusions, minimally decreased on the right. 3. Pulmonary metastatic disease better seen on CT chest 06/11/2024. Electronically Signed   By: Newell Eke M.D.   On: 07/03/2024 14:01   US  THORACENTESIS ASP PLEURAL SPACE W/IMG GUIDE Result Date: 07/03/2024 INDICATION: 88 year old female with history of stage IVb lung cancer, with new onset right-sided pleural effusion. IR was requested for diagnostic and therapeutic thoracentesis. EXAM: ULTRASOUND GUIDED DIAGNOSTIC AND THERAPEUTIC THORACENTESIS MEDICATIONS: 6 cc of 1% lidocaine  COMPLICATIONS: None immediate. PROCEDURE: An ultrasound guided thoracentesis was thoroughly discussed with the patient  and questions answered. The benefits, risks, alternatives and complications were also discussed. The patient understands and wishes to proceed with the procedure. Written consent was obtained. Ultrasound was performed to localize and mark an adequate pocket of fluid in the right chest. The area was then prepped and draped in the normal sterile fashion. 1% Lidocaine  was used for local anesthesia. Under ultrasound guidance a 6 Fr Safe-T-Centesis catheter was introduced. Thoracentesis was performed. The catheter was removed and a dressing applied. FINDINGS: A total  of approximately 1.4 L of clear, dark amber versus blood-tinged pleural fluid was removed. Samples were sent to the laboratory as requested by the clinical team. IMPRESSION: Successful ultrasound guided right thoracentesis yielding 1.4 L of pleural fluid. Procedure performed by Carlin Griffon, PA-C Electronically Signed   By: CHRISTELLA.  Shick M.D.   On: 07/03/2024 13:06   ECHOCARDIOGRAM LIMITED Result Date: 07/03/2024    ECHOCARDIOGRAM LIMITED REPORT   Patient Name:   Julie Mcbride Date of Exam: 07/02/2024 Medical Rec #:  [id number]        Height:       66.0 in Accession #:    [id number]       Weight:       216.0 lb Date of Birth:  04-06-1936       BSA:          2.067 m Patient Age:    87 years         BP:           124/56 mmHg Patient Gender: F                HR:           80 bpm. Exam Location:  [facility] Procedure: Limited Echo, Cardiac Doppler and Color Doppler (Both Spectral and            Color Flow Doppler were utilized during procedure). Indications:     Dyspnea  History:         Patient has prior history of Echocardiogram examinations, most                  recent 06/11/2024. Signs/Symptoms:Dyspnea and Shortness of                  Breath; Risk Factors:Hypertension.  Sonographer:     Philomena Daring Referring Phys:  [id number] DORENE DECOSTE Diagnosing Phys: Dwayne D Callwood MD IMPRESSIONS  1. Large pleural effusion no significant pericardial effusion.  2.  Limited ECHO images.  3. Left ventricular ejection fraction, by estimation, is 65 to 70%. The left ventricle has normal function. Left ventricular diastolic parameters are consistent with Grade I diastolic dysfunction (impaired relaxation). There is the interventricular septum is flattened in diastole ('D' shaped left ventricle), consistent with right ventricular volume overload.  4. Right ventricular systolic function is normal. The right ventricular size is moderately enlarged. Mildly increased right ventricular wall thickness. There is moderately elevated pulmonary artery systolic pressure.  5. Large pleural effusion in the left lateral region.  6. The mitral valve is normal in structure. No evidence of mitral valve regurgitation.  7. The aortic valve was not well visualized. Aortic valve regurgitation is not visualized. Conclusion(s)/Recommendation(s): Poor windows for evaluation of left ventricular function by transthoracic echocardiography. Would recommend an alternative means of evaluation. FINDINGS  Left Ventricle: Left ventricular ejection fraction, by estimation, is 65 to 70%. The left ventricle has normal function. The left ventricular internal cavity size was normal in size. There is no concentric left ventricular hypertrophy. The interventricular septum is flattened in diastole ('D' shaped left ventricle), consistent with right ventricular volume overload. Left ventricular diastolic parameters are consistent with Grade I diastolic dysfunction (impaired relaxation). Right Ventricle: The right ventricular size is moderately enlarged. Mildly increased right ventricular wall thickness. Right ventricular systolic function is normal. There is moderately elevated pulmonary artery systolic pressure. Left Atrium: Left atrial size was normal in size. Right Atrium: Right atrial size was normal in size. Pericardium: There is no  evidence of pericardial effusion. Mitral Valve: The mitral valve is normal in structure.  Tricuspid Valve: The tricuspid valve is not well visualized. Tricuspid valve regurgitation is not demonstrated. Aortic Valve: The aortic valve was not well visualized. Aortic valve regurgitation is not visualized. Pulmonic Valve: The pulmonic valve was not well visualized. Aorta: The ascending aorta was not well visualized. Additional Comments: Limited ECHO images. Large pleural effusion no significant pericardial effusion. There is a large pleural effusion in the left lateral region. Spectral Doppler performed. Color Doppler performed.  LEFT VENTRICLE PLAX 2D LVIDd:         3.90 cm   Diastology LVIDs:         2.50 cm   LV e' medial: 3.70 cm/s LV PW:         1.00 cm LV IVS:        1.00 cm LVOT diam:     1.90 cm LV SV:         90 LV SV Index:   43 LVOT Area:     2.84 cm  RIGHT VENTRICLE            IVC RV S prime:     9.79 cm/s  IVC diam: 1.90 cm TAPSE (M-mode): 1.8 cm LEFT ATRIUM         Index LA diam:    3.00 cm 1.45 cm/m  AORTIC VALVE LVOT Vmax:   151.00 cm/s LVOT Vmean:  104.000 cm/s LVOT VTI:    0.316 m  SHUNTS Systemic VTI:  0.32 m Systemic Diam: 1.90 cm Cara JONETTA Lovelace MD Electronically signed by Cara JONETTA Lovelace MD Signature Date/Time: 07/03/2024/7:33:59 AM    Final    NM Pulmonary Perfusion Result Date: 07/02/2024 CLINICAL DATA:  Pulmonary embolism, high probability EXAM: NUCLEAR MEDICINE PERFUSION LUNG SCAN TECHNIQUE: Perfusion images were obtained in multiple projections after intravenous injection of radiopharmaceutical. Ventilation scans intentionally deferred if perfusion scan and chest x-ray adequate for interpretation during COVID 19 epidemic. RADIOPHARMACEUTICALS:  4.33 mCi Tc-90m MAA IV COMPARISON:  None. Findings are correlated with prior chest radiograph of 07/02/2024 FINDINGS: Moderate right and small left pleural effusions are noted on prior chest radiograph. Defects are noted involving the posterior basal aspects of the lungs bilaterally related to these pleural effusions. Linear defect  within the lungs bilaterally likely reflect fluid within the fissures. Small subsegmental defect within the right apex is nonspecific. Altogether, the findings do not support the presence of underlying acute pulmonary embolus. IMPRESSION: No pulmonary embolism. Multiple defects related to bilateral pleural effusions, right greater than left. Electronically Signed   By: Dorethia Molt M.D.   On: 07/02/2024 16:09   DG Chest Port 1 View Result Date: 07/02/2024 CLINICAL DATA:  Suspected pulmonary embolism. EXAM: PORTABLE CHEST - 1 VIEW COMPARISON:  06/30/2024. FINDINGS: Unchanged appearance of the cardiomediastinal silhouette. Unchanged mild pulmonary vascular congestion. Known diffuse metastatic disease of the lungs are better seen on recent chest CT from 06/11/2024. There is unchanged large RIGHT and small LEFT pleural effusions with adjacent airspace opacity. IMPRESSION: No significant interval change in appearance of the lungs with large RIGHT and small LEFT pleural effusions with adjacent atelectasis/pneumonia. Electronically Signed   By: Aliene Lloyd M.D.   On: 07/02/2024 14:34    Scheduled Meds:  apixaban   5 mg Oral BID   aspirin  EC  81 mg Oral Daily   atorvastatin   10 mg Oral Daily   bisoprolol   10 mg Oral Daily   dextromethorphan -guaiFENesin   1 tablet Oral BID  hydrALAZINE   100 mg Oral BID   lidocaine   1 patch Transdermal Q24H   loratadine   10 mg Oral Daily   methylPREDNISolone  (SOLU-MEDROL ) injection  40 mg Intravenous BID   polyethylene glycol  34 g Oral BID   rOPINIRole   0.5 mg Oral TID   Continuous Infusions:  azithromycin  Stopped (07/02/24 2318)   cefTRIAXone  (ROCEPHIN )  IV Stopped (07/02/24 2208)     LOS: 3 days  MDM: Patient is high risk for one or more organ failure.  They necessitate ongoing hospitalization for continued IV therapies and subsequent lab monitoring. Total time spent interpreting labs and vitals, reviewing the medical record, coordinating care amongst consultants  and care team members, directly assessing and discussing care with the patient and/or family: 55 min  Nylene Inlow, DO [facility]  To contact the attending physician between 7A-7P please use Epic Chat. To contact the covering physician during after hours 7P-7A, please review Amion.  07/03/2024, 3:00 PM   *This document has been created with the assistance of dictation software. Please excuse typographical errors. *

## 2024-07-03 NOTE — Progress Notes (Signed)
 [facility] CLINIC CARDIOLOGY PROGRESS NOTE       Patient ID: Julie Mcbride MRN: [id number] DOB/AGE: 1936/05/18 88 y.o.  Admit date: 06/30/2024 Referring Physician Dr. Awanda Primary Physician Alla Amis, MD Primary Cardiologist Dr. Florencio (2022) Reason for Consultation severe dyspnea  HPI: Julie Mcbride is a 88 y.o. female  from [facility] rehab with a past medical history of hypertension, hyperlipidemia, hx NSTEMI (2022) treated medically, stage IV lung cancer with mets to liver and bones, recent saddle PE s/p thrombectomy 05/2024 (on Eliquis ), CKG stage 3a, renal artery stenosis, dementia, obesity who presented to the ED on 06/30/2024 for worsening SOB associated with productive cough and diminished appetite. Cardiology was consulted for further evaluation.   Interval History: -Patient seen and examined this AM and laying comfortably in hospital bed with family at bedside. Patient states breathing feels better and denies chest pain or palpitations.  -Patients BP elevated and HR stable this AM. Overnight Tele showed no significant events.  -Patient remains on HHFNC 35 with stable SpO2.    Review of systems complete and found to be negative unless listed above    Past Medical History:  Diagnosis Date   Allergy    Anemia    Arthritis    Knees, hands   Basal cell carcinoma    back - removed   Bladder polyp    With previos hematuria   Diverticulitis    Dyspnea    Dyspnea on exertion    GERD (gastroesophageal reflux disease)    History of ETT    Myoview 7/10 EF 78%, no ischemia or infarction. Poor exercise tolerance (2'30, stopped due to fatigue). Reached 86% MPHR.   Hypertension    Kidney stones    Mixed Alzheimer's and vascular dementia (HCC)    Multiple allergies    Myocardial infarction (HCC)    Palpitations    Holter 7/10 with rare PACs   Postmenopausal    S/P hysterectomy    S/P laminectomy    Squamous cell carcinoma in situ (SCCIS) of skin of left lower leg  07/20/2017    Past Surgical History:  Procedure Laterality Date   ABDOMINAL HYSTERECTOMY     ANKLE FRACTURE SURGERY Right 9/14   Dr. IVAR Mori   BACK SURGERY     BASAL CELL CARCINOMA EXCISION     back   BREAST BIOPSY Right 06/19/2017   Affirm Bx- Radial Scar   BREAST EXCISIONAL BIOPSY Right 1996   neg   BREAST LUMPECTOMY WITH NEEDLE LOCALIZATION Right 08/11/2017   Procedure: BREAST LUMPECTOMY WITH NEEDLE LOCALIZATION;  Surgeon: Claudene Larinda Bolder, MD;  Location: [facility] ORS;  Service: General;  Laterality: Right;   CARPAL TUNNEL RELEASE Right    CATARACT EXTRACTION W/ INTRAOCULAR LENS  IMPLANT, BILATERAL     EXCISION OF BREAST BIOPSY Right 08/11/2017   excision of radial Scar   EYE SURGERY     JOINT REPLACEMENT Right    knee   KNEE ARTHROSCOPY Right 04/22/2016   Procedure: ARTHROSCOPY KNEE;  Surgeon: Helayne Glenn, MD;  Location: [facility];  Service: Orthopedics;  Laterality: Right;   knee replacement Right 01/11/2017   NASAL SINUS SURGERY  06/05/14   Dr. Milissa   POSTERIOR CERVICAL LAMINECTOMY  2005   C5-7; plate and screws   PULMONARY THROMBECTOMY Bilateral 06/11/2024   Procedure: PULMONARY THROMBECTOMY;  Surgeon: Jama Cordella MATSU, MD;  Location: [facility] INVASIVE CV LAB;  Service: Cardiovascular;  Laterality: Bilateral;   RENAL ANGIOGRAPHY Left 04/20/2020   Procedure: RENAL ANGIOGRAPHY;  Surgeon: Marea Selinda RAMAN, MD;  Location: [facility] INVASIVE CV LAB;  Service: Cardiovascular;  Laterality: Left;   TONSILLECTOMY     VIDEO BRONCHOSCOPY WITH ENDOBRONCHIAL NAVIGATION N/A 05/24/2024   Procedure: VIDEO BRONCHOSCOPY WITH ENDOBRONCHIAL NAVIGATION;  Surgeon: Parris Manna, MD;  Location: [facility] ORS;  Service: Thoracic;  Laterality: N/A;   VIDEO BRONCHOSCOPY WITH ENDOBRONCHIAL ULTRASOUND N/A 05/24/2024   Procedure: BRONCHOSCOPY, WITH EBUS;  Surgeon: Parris Manna, MD;  Location: [facility] ORS;  Service: Thoracic;  Laterality: N/A;    Medications Prior to Admission  Medication Sig Dispense  Refill Last Dose/Taking   acetaminophen  (TYLENOL ) 500 MG tablet Take 1,000 mg by mouth every 8 (eight) hours as needed.   06/21/2024   ACETAMINOPHEN  PO Take 650 mg by mouth every 8 (eight) hours.   06/30/2024 at  2:12 PM   Acidophilus Lactobacillus CAPS Take 1 capsule by mouth daily.   06/30/2024 at  8:13 AM   albuterol  (PROVENTIL ) (2.5 MG/3ML) 0.083% nebulizer solution Inhale 3 mLs into the lungs every 6 (six) hours as needed for wheezing or shortness of breath.   06/30/2024 at 12:02 PM   ALPRAZolam  (XANAX ) 0.25 MG tablet Take 0.25 mg by mouth 3 (three) times daily as needed for anxiety.   06/30/2024 at  2:46 PM   apixaban  (ELIQUIS ) 5 MG TABS tablet Take 1 tablet (5 mg total) by mouth 2 (two) times daily.   06/30/2024 at  4:22 PM   aspirin  EC 81 MG tablet Take 1 tablet (81 mg total) by mouth daily. Swallow whole.   06/30/2024 at  8:13 AM   atorvastatin  (LIPITOR) 10 MG tablet Take 1 tablet (10 mg total) by mouth daily. 30 tablet 11 06/30/2024 at  5:24 PM   bisoprolol  (ZEBETA ) 10 MG tablet Take 1 tablet (10 mg total) by mouth daily.   06/30/2024 at  8:13 AM   cetirizine (ZYRTEC) 10 MG tablet Take 10 mg by mouth as needed.   06/30/2024 at  8:13 AM   Cholecalciferol  (VITAMIN D ) 50 MCG (2000 UT) tablet Take 4,000 Units by mouth daily.    06/30/2024 at  8:13 AM   doxycycline (VIBRAMYCIN) 100 MG capsule Take 100 mg by mouth 2 (two) times daily.   06/30/2024 at  4:22 PM   estradiol  (ESTRACE  VAGINAL) 0.1 MG/GM vaginal cream Apply 0.5mg  (pea-sized amount)  just inside the vaginal introitus with a finger-tip on Monday, Wednesday and Friday nights. 30 g 12 06/28/2024   fluticasone  (FLONASE ) 50 MCG/ACT nasal spray Place 2 sprays into both nostrils as needed for allergies or rhinitis.   06/29/2024 at 10:41 AM   furosemide  (LASIX ) 20 MG tablet Take 20 mg by mouth daily.   06/30/2024 at  8:13 AM   guaiFENesin  (ROBITUSSIN) 100 MG/5ML liquid Take 5 mLs by mouth every 4 (four) hours as needed for cough or to loosen phlegm.   Unknown    hydrALAZINE  (APRESOLINE ) 50 MG tablet Take 2 tablets (100 mg total) by mouth in the morning and at bedtime. TAKE 2 TABLETS BY MOUTH EVERY MORNING TAKE ONE TABLET MID-DAY AND TAKE ONE TABLET BEFORE BED   06/30/2024 at  2:12 PM   lidocaine  4 % Place 1 patch onto the skin daily. REMOVE & DISCARD PATCH WITHIN 12 HOURS OR AS DIRECTED BY MD   06/29/2024 at  9:13 PM   methocarbamol  (ROBAXIN ) 500 MG tablet Take 1 tablet (500 mg total) by mouth every 6 (six) hours as needed for muscle spasms. 30 tablet 0 06/30/2024 at  4:21 PM  oxyCODONE -acetaminophen  (PERCOCET/ROXICET) 5-325 MG tablet Take 1-2 tablets by mouth every 6 (six) hours as needed for moderate pain (pain score 4-6) or severe pain (pain score 7-10). (Patient taking differently: Take 1 tablet by mouth every 6 (six) hours as needed for moderate pain (pain score 4-6) or severe pain (pain score 7-10).) 12 tablet 0 06/30/2024 at  4:21 PM   polyethylene glycol (MIRALAX  / GLYCOLAX ) 17 g packet Take 17 g by mouth 2 (two) times daily.   06/30/2024 at  8:13 AM   rOPINIRole  (REQUIP ) 0.25 MG tablet Take 0.5 mg by mouth 3 (three) times daily.   06/30/2024 at  4:22 PM   senna-docusate (SENOKOT-S) 8.6-50 MG tablet Take 1 tablet by mouth 2 (two) times daily.   06/30/2024 at  8:13 AM   triamcinolone  ointment (KENALOG ) 0.1 % Apply topically 2 (two) times daily as needed.   Unknown   Social History   Socioeconomic History   Marital status: Married    Spouse name: Not on file   Number of children: Not on file   Years of education: Not on file   Highest education level: Not on file  Occupational History   Occupation: Retired  Tobacco Use   Smoking status: Never   Smokeless tobacco: Never  Vaping Use   Vaping status: Never Used  Substance and Sexual Activity   Alcohol use: No   Drug use: No   Sexual activity: Not on file  Other Topics Concern   Not on file  Social History Narrative   Lives with husband (married)   Social Drivers of Corporate investment banker  Strain: Low Risk  (05/15/2024)   Received from [facility]   Overall Financial Resource Strain (CARDIA)    Difficulty of Paying Living Expenses: Not hard at all  Food Insecurity: No Food Insecurity (07/01/2024)   Hunger Vital Sign    Worried About Running Out of Food in the Last Year: Never true    Ran Out of Food in the Last Year: Never true  Transportation Needs: No Transportation Needs (07/01/2024)   PRAPARE - Administrator, Civil Service (Medical): No    Lack of Transportation (Non-Medical): No  Physical Activity: Not on file  Stress: Not on file  Social Connections: Socially Integrated (07/01/2024)   Social Connection and Isolation Panel    Frequency of Communication with Friends and Family: More than three times a week    Frequency of Social Gatherings with Friends and Family: More than three times a week    Attends Religious Services: More than 4 times per year    Active Member of [employer] or Organizations: Yes    Attends Engineer, structural: More than 4 times per year    Marital Status: Married  Catering manager Violence: Not At Risk (07/01/2024)   Humiliation, Afraid, Rape, and Kick questionnaire    Fear of Current or Ex-Partner: No    Emotionally Abused: No    Physically Abused: No    Sexually Abused: No    Family History  Problem Relation Age of Onset   Ovarian cancer Mother    Cancer Mother    Stroke Father        CVA   Breast cancer Daughter 3   Breast cancer Maternal Aunt 60   Breast cancer Maternal Aunt 90   Prostate cancer Neg Hx    Bladder Cancer Neg Hx    Kidney cancer Neg Hx      Vitals:  07/03/24 0230 07/03/24 0337 07/03/24 0811 07/03/24 0841  BP:  134/80  (!) 156/74  Pulse:  77  81  Resp:  20  20  Temp:  97.7 F (36.5 C)  97.6 F (36.4 C)  TempSrc:  Rectal    SpO2: 92% 94% 95% 92%  Height:        PHYSICAL EXAM General: chronically ill appearing elderly female, well nourished, in mild distress. HEENT:  Normocephalic and atraumatic. Neck: No JVD.   Lungs: Normal respiratory effort on HHFNC. Diminished breath sounds bilaterally. Heart: HRRR. Normal S1 and S2 without gallops or murmurs.  Abdomen: Non-distended appearing.  Msk: Normal strength and tone for age. Extremities: Warm and well perfused. No clubbing, cyanosis, edema.  Neuro: Alert and oriented X 3. Psych: Answers questions appropriately.   Labs: Basic Metabolic Panel: Recent Labs    07/01/24 0348 07/02/24 0458  NA 141 142  K 3.9 4.0  CL 101 105  CO2 29 27  GLUCOSE 159* 165*  BUN 32* 46*  CREATININE 1.44* 1.49*  CALCIUM  9.2 9.0  MG 2.2 2.1   Liver Function Tests: Recent Labs    06/30/24 1849  AST 33  ALT 20  ALKPHOS 144*  BILITOT 0.7  PROT 6.1*  ALBUMIN  2.9*   No results for input(s): LIPASE, AMYLASE in the last 72 hours. CBC: Recent Labs    06/30/24 1849 07/01/24 0348 07/02/24 0458 07/03/24 0531  WBC 19.3*   < > 17.0* 16.1*  NEUTROABS 12.7*  --   --   --   HGB 10.7*   < > 9.6* 9.5*  HCT 34.1*   < > 30.8* 29.6*  MCV 95.0   < > 93.3 92.2  PLT 309   < > 207 187   < > = values in this interval not displayed.   Cardiac Enzymes: Recent Labs    07/01/24 0127 07/01/24 0455 07/01/24 1013  TROPONINIHS 637* 764* 724*   BNP: Recent Labs    06/30/24 1849  BNP 796.9*   D-Dimer: No results for input(s): DDIMER in the last 72 hours. Hemoglobin A1C: No results for input(s): HGBA1C in the last 72 hours. Fasting Lipid Panel: No results for input(s): CHOL, HDL, LDLCALC, TRIG, CHOLHDL, LDLDIRECT in the last 72 hours. Thyroid Function Tests: No results for input(s): TSH, T4TOTAL, T3FREE, THYROIDAB in the last 72 hours.  Invalid input(s): FREET3 Anemia Panel: No results for input(s): VITAMINB12, FOLATE, FERRITIN, TIBC, IRON, RETICCTPCT in the last 72 hours.   Radiology: ECHOCARDIOGRAM LIMITED Result Date: 07/03/2024    ECHOCARDIOGRAM LIMITED REPORT   Patient  Name:   Julie Mcbride Date of Exam: 07/02/2024 Medical Rec #:  [id number]        Height:       66.0 in Accession #:    [id number]       Weight:       216.0 lb Date of Birth:  1936/02/17       BSA:          2.067 m Patient Age:    87 years         BP:           124/56 mmHg Patient Gender: F                HR:           80 bpm. Exam Location:  [facility] Procedure: Limited Echo, Cardiac Doppler and Color Doppler (Both Spectral and  Color Flow Doppler were utilized during procedure). Indications:     Dyspnea  History:         Patient has prior history of Echocardiogram examinations, most                  recent 06/11/2024. Signs/Symptoms:Dyspnea and Shortness of                  Breath; Risk Factors:Hypertension.  Sonographer:     Philomena Daring Referring Phys:  [id number] DORENE Ranie Chinchilla Diagnosing Phys: Dwayne D Callwood MD IMPRESSIONS  1. Large pleural effusion no significant pericardial effusion.  2. Limited ECHO images.  3. Left ventricular ejection fraction, by estimation, is 65 to 70%. The left ventricle has normal function. Left ventricular diastolic parameters are consistent with Grade I diastolic dysfunction (impaired relaxation). There is the interventricular septum is flattened in diastole ('D' shaped left ventricle), consistent with right ventricular volume overload.  4. Right ventricular systolic function is normal. The right ventricular size is moderately enlarged. Mildly increased right ventricular wall thickness. There is moderately elevated pulmonary artery systolic pressure.  5. Large pleural effusion in the left lateral region.  6. The mitral valve is normal in structure. No evidence of mitral valve regurgitation.  7. The aortic valve was not well visualized. Aortic valve regurgitation is not visualized. Conclusion(s)/Recommendation(s): Poor windows for evaluation of left ventricular function by transthoracic echocardiography. Would recommend an alternative means of evaluation. FINDINGS  Left  Ventricle: Left ventricular ejection fraction, by estimation, is 65 to 70%. The left ventricle has normal function. The left ventricular internal cavity size was normal in size. There is no concentric left ventricular hypertrophy. The interventricular septum is flattened in diastole ('D' shaped left ventricle), consistent with right ventricular volume overload. Left ventricular diastolic parameters are consistent with Grade I diastolic dysfunction (impaired relaxation). Right Ventricle: The right ventricular size is moderately enlarged. Mildly increased right ventricular wall thickness. Right ventricular systolic function is normal. There is moderately elevated pulmonary artery systolic pressure. Left Atrium: Left atrial size was normal in size. Right Atrium: Right atrial size was normal in size. Pericardium: There is no evidence of pericardial effusion. Mitral Valve: The mitral valve is normal in structure. Tricuspid Valve: The tricuspid valve is not well visualized. Tricuspid valve regurgitation is not demonstrated. Aortic Valve: The aortic valve was not well visualized. Aortic valve regurgitation is not visualized. Pulmonic Valve: The pulmonic valve was not well visualized. Aorta: The ascending aorta was not well visualized. Additional Comments: Limited ECHO images. Large pleural effusion no significant pericardial effusion. There is a large pleural effusion in the left lateral region. Spectral Doppler performed. Color Doppler performed.  LEFT VENTRICLE PLAX 2D LVIDd:         3.90 cm   Diastology LVIDs:         2.50 cm   LV e' medial: 3.70 cm/s LV PW:         1.00 cm LV IVS:        1.00 cm LVOT diam:     1.90 cm LV SV:         90 LV SV Index:   43 LVOT Area:     2.84 cm  RIGHT VENTRICLE            IVC RV S prime:     9.79 cm/s  IVC diam: 1.90 cm TAPSE (M-mode): 1.8 cm LEFT ATRIUM         Index LA diam:    3.00 cm 1.45  cm/m  AORTIC VALVE LVOT Vmax:   151.00 cm/s LVOT Vmean:  104.000 cm/s LVOT VTI:    0.316 m   SHUNTS Systemic VTI:  0.32 m Systemic Diam: 1.90 cm Cara JONETTA Lovelace MD Electronically signed by Cara JONETTA Lovelace MD Signature Date/Time: 07/03/2024/7:33:59 AM    Final    NM Pulmonary Perfusion Result Date: 07/02/2024 CLINICAL DATA:  Pulmonary embolism, high probability EXAM: NUCLEAR MEDICINE PERFUSION LUNG SCAN TECHNIQUE: Perfusion images were obtained in multiple projections after intravenous injection of radiopharmaceutical. Ventilation scans intentionally deferred if perfusion scan and chest x-ray adequate for interpretation during COVID 19 epidemic. RADIOPHARMACEUTICALS:  4.33 mCi Tc-90m MAA IV COMPARISON:  None. Findings are correlated with prior chest radiograph of 07/02/2024 FINDINGS: Moderate right and small left pleural effusions are noted on prior chest radiograph. Defects are noted involving the posterior basal aspects of the lungs bilaterally related to these pleural effusions. Linear defect within the lungs bilaterally likely reflect fluid within the fissures. Small subsegmental defect within the right apex is nonspecific. Altogether, the findings do not support the presence of underlying acute pulmonary embolus. IMPRESSION: No pulmonary embolism. Multiple defects related to bilateral pleural effusions, right greater than left. Electronically Signed   By: Dorethia Molt M.D.   On: 07/02/2024 16:09   DG Chest Port 1 View Result Date: 07/02/2024 CLINICAL DATA:  Suspected pulmonary embolism. EXAM: PORTABLE CHEST - 1 VIEW COMPARISON:  06/30/2024. FINDINGS: Unchanged appearance of the cardiomediastinal silhouette. Unchanged mild pulmonary vascular congestion. Known diffuse metastatic disease of the lungs are better seen on recent chest CT from 06/11/2024. There is unchanged large RIGHT and small LEFT pleural effusions with adjacent airspace opacity. IMPRESSION: No significant interval change in appearance of the lungs with large RIGHT and small LEFT pleural effusions with adjacent  atelectasis/pneumonia. Electronically Signed   By: Aliene Lloyd M.D.   On: 07/02/2024 14:34   DG Chest Port 1 View Result Date: 06/30/2024 CLINICAL DATA:  Shortness of breath. EXAM: PORTABLE CHEST 1 VIEW COMPARISON:  06/14/2024. FINDINGS: The heart size and mediastinal contours are unchanged. Moderate right and small to moderate left pleural effusions, increased since the prior exam with associated bibasilar atelectasis. Increased bilateral multifocal interstitial opacities. No pneumothorax. Cervical fixation hardware. No acute osseous abnormality. IMPRESSION: 1. Moderate right and small to moderate left pleural effusions, increased since the prior exam with associated bibasilar atelectasis. 2. Increased bilateral multifocal interstitial opacities could reflect pulmonary edema or multifocal infection. Electronically Signed   By: Harrietta Sherry M.D.   On: 06/30/2024 19:34   MR BRAIN W WO CONTRAST Result Date: 06/16/2024 EXAM: MRI BRAIN WITH AND WITHOUT CONTRAST 06/16/2024 06:30:00 PM TECHNIQUE: Multiplanar multisequence MRI of the head/brain was performed with and without the administration of intravenous contrast. COMPARISON: 06/07/2023 CLINICAL HISTORY: Cancer staging. Lung CA. Eval for mets. FINDINGS: BRAIN AND VENTRICLES: No acute infarct. No acute intracranial hemorrhage. No mass effect or midline shift. No hydrocephalus. The sella is unremarkable. Normal flow voids. No mass or abnormal enhancement. Multifocal hyperintense T2-weighted signal within the cerebral white matter, most commonly due to chronic small vessel disease. Generalized volume loss. 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 ORBITS: No acute abnormality. SINUSES: No acute abnormality. BONES AND SOFT TISSUES: Normal bone marrow signal and enhancement. No acute soft tissue abnormality. IMPRESSION: 1. No acute intracranial abnormality. 2. Multifocal hyperintense T2-weighted signal within the cerebral white matter, most commonly due  to chronic small vessel disease. 3. Generalized volume loss. 4. 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 Electronically  signed by: Franky Stanford MD 06/16/2024 08:35 PM EDT RP Workstation: [id number]   DG Chest Port 1 View Result Date: 06/14/2024 CLINICAL DATA:  Cough and breathing difficulty EXAM: PORTABLE CHEST 1 VIEW COMPARISON:  Chest radiograph dated 06/10/2024 FINDINGS: Low lung volumes with bronchovascular crowding. Increased patchy bibasilar opacities and mild interstitial opacities. Similar trace bilateral pleural effusions. No pneumothorax. Similar mildly enlarged cardiomediastinal silhouette. Cervical spinal fixation hardware appears intact. IMPRESSION: 1. Increased patchy bibasilar opacities and mild interstitial opacities, which may represent pulmonary edema or multifocal infection. 2. Similar trace bilateral pleural effusions. Electronically Signed   By: Limin  Xu M.D.   On: 06/14/2024 13:41   DG HIP UNILAT WITH PELVIS 2-3 VIEWS RIGHT Result Date: 06/14/2024 CLINICAL DATA:  [id number] Hip pain [id number] EXAM: DG HIP (WITH OR WITHOUT PELVIS) 2-3V RIGHT COMPARISON:  March 09, 2023 FINDINGS: No evidence of pelvic fracture or diastasis.No acute hip fracture or dislocation.Moderate joint space loss of the left hip. Degenerative disc disease of the visualized lumbar spine.Soft tissues are unremarkable. IMPRESSION: 1. No acute fracture, pelvic bone diastasis, or dislocation. 2. Moderate osteoarthritis of the left hip. Electronically Signed   By: Rogelia Myers M.D.   On: 06/14/2024 13:20   ECHOCARDIOGRAM COMPLETE Result Date: 06/11/2024    ECHOCARDIOGRAM REPORT   Patient Name:   Julie Mcbride Date of Exam: 06/11/2024 Medical Rec #:  [id number]        Height:       65.0 in Accession #:    [id number]       Weight:       210.9 lb Date of Birth:  [birth year]       BSA:          2.023 m Patient Age:    87 years         BP:           113/66 mmHg Patient Gender: F                HR:           91 bpm.  Exam Location:  [facility] Procedure: 2D Echo, Cardiac Doppler and Color Doppler (Both Spectral and Color            Flow Doppler were utilized during procedure). Indications:     pulmonary embolus I26.09  History:         Patient has prior history of Echocardiogram examinations, most                  recent 12/26/2020. Previous Myocardial Infarction,                  Signs/Symptoms:Dyspnea; Risk Factors:Hypertension.  Sonographer:     Christopher Furnace Referring Phys:  [id number] JAN A MANSY Diagnosing Phys: Lonni Hanson MD IMPRESSIONS  1. Left ventricular ejection fraction, by estimation, is 65 to 70%. The left ventricle has normal function. The left ventricle has no regional wall motion abnormalities. There is moderate left ventricular hypertrophy. Left ventricular diastolic parameters are indeterminate. There is the interventricular septum is flattened in diastole ('D' shaped left ventricle), consistent with right ventricular volume overload.  2. Pulmonary artery pressure is mildly elevated (RVSP 35 mmHg plus central venous/right atrial pressure). Right ventricular systolic function is moderately reduced. The right ventricular size is moderately enlarged.  3. The mitral valve is degenerative. Trivial mitral valve regurgitation. No evidence of mitral stenosis.  4. The aortic valve is tricuspid. Aortic valve regurgitation is not visualized. No aortic stenosis is  present. FINDINGS  Left Ventricle: Left ventricular ejection fraction, by estimation, is 65 to 70%. The left ventricle has normal function. The left ventricle has no regional wall motion abnormalities. The left ventricular internal cavity size was normal in size. There is  moderate left ventricular hypertrophy. The interventricular septum is flattened in diastole ('D' shaped left ventricle), consistent with right ventricular volume overload. Left ventricular diastolic parameters are indeterminate. Right Ventricle: Pulmonary artery pressure is mildly elevated (RVSP 35  mmHg plus central venous/right atrial pressure). The right ventricular size is moderately enlarged. No increase in right ventricular wall thickness. Right ventricular systolic function is moderately reduced. Left Atrium: Left atrial size was normal in size. Right Atrium: Right atrial size was normal in size. Pericardium: There is no evidence of pericardial effusion. Mitral Valve: The mitral valve is degenerative in appearance. There is mild thickening of the mitral valve leaflet(s). Mild mitral annular calcification. Trivial mitral valve regurgitation. No evidence of mitral valve stenosis. Tricuspid Valve: The tricuspid valve is normal in structure. Tricuspid valve regurgitation is mild. Aortic Valve: The aortic valve is tricuspid. Aortic valve regurgitation is not visualized. No aortic stenosis is present. Aortic valve mean gradient measures 3.0 mmHg. Aortic valve peak gradient measures 5.2 mmHg. Aortic valve area, by VTI measures 2.53 cm. Pulmonic Valve: The pulmonic valve was not well visualized. Pulmonic valve regurgitation is not visualized. No evidence of pulmonic stenosis. Aorta: The aortic root is normal in size and structure. Pulmonary Artery: The pulmonary artery is not well seen. Venous: The inferior vena cava was not well visualized. IAS/Shunts: The interatrial septum was not well visualized.  LEFT VENTRICLE PLAX 2D LVIDd:         3.30 cm   Diastology LVIDs:         2.20 cm   LV e' medial:    4.79 cm/s LV PW:         1.20 cm   LV E/e' medial:  15.6 LV IVS:        1.38 cm   LV e' lateral:   11.50 cm/s LVOT diam:     2.00 cm   LV E/e' lateral: 6.5 LV SV:         44 LV SV Index:   22 LVOT Area:     3.14 cm  RIGHT VENTRICLE RV Basal diam:  4.00 cm RV Mid diam:    4.00 cm LEFT ATRIUM             Index        RIGHT ATRIUM           Index LA diam:        3.00 cm 1.48 cm/m   RA Area:     17.90 cm LA Vol (A2C):   42.0 ml 20.76 ml/m  RA Volume:   51.30 ml  25.36 ml/m LA Vol (A4C):   18.7 ml 9.24 ml/m LA  Biplane Vol: 29.2 ml 14.43 ml/m  AORTIC VALVE AV Area (Vmax):    2.51 cm AV Area (Vmean):   2.20 cm AV Area (VTI):     2.53 cm AV Vmax:           114.00 cm/s AV Vmean:          88.200 cm/s AV VTI:            0.174 m AV Peak Grad:      5.2 mmHg AV Mean Grad:      3.0 mmHg LVOT Vmax:  91.00 cm/s LVOT Vmean:        61.700 cm/s LVOT VTI:          0.140 m LVOT/AV VTI ratio: 0.80  AORTA Ao Root diam: 3.20 cm MITRAL VALVE                TRICUSPID VALVE MV Area (PHT): 5.97 cm     TR Peak grad:   34.8 mmHg MV Decel Time: 127 msec     TR Vmax:        295.00 cm/s MV E velocity: 74.90 cm/s MV A velocity: 125.00 cm/s  SHUNTS MV E/A ratio:  0.60         Systemic VTI:  0.14 m                             Systemic Diam: 2.00 cm Lonni Hanson MD Electronically signed by Lonni Hanson MD Signature Date/Time: 06/11/2024/3:14:06 PM    Final    PERIPHERAL VASCULAR CATHETERIZATION Result Date: 06/11/2024 See surgical note for result.  US  Venous Img Lower Bilateral (DVT) Result Date: 06/11/2024 CLINICAL DATA:  Patient with pulmonary embolism.  Evaluate for DVT. EXAM: BILATERAL LOWER EXTREMITY VENOUS DOPPLER ULTRASOUND TECHNIQUE: Gray-scale sonography with graded compression, as well as color Doppler and duplex ultrasound were performed to evaluate the lower extremity deep venous systems from the level of the common femoral vein and including the common femoral, femoral, profunda femoral, popliteal and calf veins including the posterior tibial, peroneal and gastrocnemius veins when visible. The superficial great saphenous vein was also interrogated. Spectral Doppler was utilized to evaluate flow at rest and with distal augmentation maneuvers in the common femoral, femoral and popliteal veins. COMPARISON:  None Available. FINDINGS: RIGHT LOWER EXTREMITY Common Femoral Vein: No evidence of thrombus. Normal compressibility, respiratory phasicity and response to augmentation. Saphenofemoral Junction: No evidence of  thrombus. Normal compressibility and flow on color Doppler imaging. Profunda Femoral Vein: No evidence of thrombus. Normal compressibility and flow on color Doppler imaging. Femoral Vein: Examination is positive for nonocclusive thrombus within the proximal right femoral vein. Popliteal Vein: No evidence of thrombus. Normal compressibility, respiratory phasicity and response to augmentation. Calf Veins: No evidence of thrombus. Normal compressibility and flow on color Doppler imaging. Superficial Great Saphenous Vein: No evidence of thrombus. Normal compressibility. Venous Reflux:  None. Other Findings:  None. LEFT LOWER EXTREMITY Common Femoral Vein: No evidence of thrombus. Normal compressibility, respiratory phasicity and response to augmentation. Saphenofemoral Junction: No evidence of thrombus. Normal compressibility and flow on color Doppler imaging. Profunda Femoral Vein: No evidence of thrombus. Normal compressibility and flow on color Doppler imaging. Femoral Vein: No evidence of thrombus. Normal compressibility, respiratory phasicity and response to augmentation. Popliteal Vein: No evidence of thrombus. Normal compressibility, respiratory phasicity and response to augmentation. Calf Veins: No evidence of thrombus. Normal compressibility and flow on color Doppler imaging. Superficial Great Saphenous Vein: No evidence of thrombus. Normal compressibility. Venous Reflux:  None. Other Findings:  None. IMPRESSION: 1. Examination is positive for nonocclusive thrombus within the proximal right femoral vein. 2. No evidence for left lower extremity deep venous thrombosis. Electronically Signed   By: Waddell Calk M.D.   On: 06/11/2024 06:39   CT Angio Chest PE W and/or Wo Contrast Result Date: 06/11/2024 CLINICAL DATA:  Shortness of breath and left-sided chest pain, known history of lung carcinoma EXAM: CT ANGIOGRAPHY CHEST WITH CONTRAST TECHNIQUE: Multidetector CT imaging of the chest was performed using the  standard protocol during bolus administration of intravenous contrast. Multiplanar CT image reconstructions and MIPs were obtained to evaluate the vascular anatomy. RADIATION DOSE REDUCTION: This exam was performed according to the departmental dose-optimization program which includes automated exposure control, adjustment of the mA and/or kV according to patient size and/or use of iterative reconstruction technique. CONTRAST:  75mL OMNIPAQUE  IOHEXOL  350 MG/ML SOLN COMPARISON:  05/24/2024 FINDINGS: Cardiovascular: Atherosclerotic calcifications of the thoracic aorta are noted. No aneurysmal dilatation or dissection is seen. The pulmonary artery shows a normal branching pattern bilaterally. Extensive pulmonary emboli are noted right greater than left. There are changes consistent with right heart strain with an RV/LV ratio of 1.5. Mediastinum/Nodes: Thoracic inlet is within normal limits. No hilar or mediastinal adenopathy is noted. The esophagus as visualized is within normal limits. Sliding-type hiatal hernia is seen. Lungs/Pleura: Small left effusion is noted. Scattered nodular densities are seen similar to that noted on prior exam consistent with metastatic disease. The largest index lesion in the left lower lobe measures approximately 17 mm in greatest dimension stable from the prior study. Similar findings are noted within the right lung although a large pleural effusion is noted increased when compared with the prior exam. Index lesion in the right lower lobe measures 17 mm also stable from the prior exam. Upper Abdomen: Visualized upper abdomen shows stable cyst in the left lobe of the liver. Musculoskeletal: Degenerative changes of the thoracic spine are noted. No compression deformity is noted. Review of the MIP images confirms the above findings. IMPRESSION: Extensive bilateral pulmonary emboli with evidence of right heart strain. The RV/LV ratio measures 1.5. Scattered pulmonary nodules again identified  consistent with metastatic disease. The overall appearance is similar to that seen on the recent exam. Aortic Atherosclerosis (ICD10-I70.0). Critical Value/emergent results were called by telephone at the time of interpretation on 06/11/2024 at 1:24 am to Dr. KEVIN PADUCHOWSKI , who verbally acknowledged these results. Electronically Signed   By: Oneil Devonshire M.D.   On: 06/11/2024 01:26   DG Chest Port 1 View Result Date: 06/10/2024 CLINICAL DATA:  Shortness of breath EXAM: PORTABLE CHEST 1 VIEW COMPARISON:  Chest x-ray 05/24/2024.  CT of the chest 05/24/2024. FINDINGS: Heart is enlarged. There are small bilateral pleural effusions. There scattered bilateral patchy airspace opacities in both lungs increasing in the left upper lobe. Scattered nodular densities are grossly unchanged. No pneumothorax or acute fracture identified. IMPRESSION: 1. Scattered bilateral patchy airspace opacities in both lungs increasing in the left upper lobe. Findings may represent edema or infection. 2. Small bilateral pleural effusions. 3. Stable scattered nodular densities. 4. Cardiomegaly. Electronically Signed   By: Greig Pique M.D.   On: 06/10/2024 23:14    ECHO as above.  06/11/2024  1. Left ventricular ejection fraction, by estimation, is 65 to 70%. The left ventricle has normal function. The left ventricle has no regional wall motion abnormalities. There is moderate left ventricular hypertrophy. Left ventricular diastolic parameters are indeterminate. There is the interventricular septum is flattened in diastole ('D' shaped left ventricle), consistent with right ventricular volume overload.   2. Pulmonary artery pressure is mildly elevated (RVSP 35 mmHg plus central venous/right atrial pressure). Right ventricular systolic function is moderately reduced. The right ventricular size is moderately enlarged.   3. The mitral valve is degenerative. Trivial mitral valve regurgitation. No evidence of mitral stenosis.   4. The  aortic valve is tricuspid. Aortic valve regurgitation is not visualized. No aortic stenosis is present.    TELEMETRY reviewed by  me 07/03/2024: sinus rhythm, rate 70s  EKG reviewed by me: sinus tachycardia with LBBB, rate 109 bpm with no acute ischemic changes.  Data reviewed by me 07/03/2024: last 24h vitals tele labs imaging I/O hospitalist progress notes, pulmonology notes.  Principal Problem:   Sepsis due to pneumonia [facility]) Active Problems:   Essential hypertension   CKD stage 3a, GFR 45-59 ml/min (HCC)   Ischemic cardiomyopathy   Adenocarcinoma, lung, unspecified laterality (HCC)   Class 2 obesity   Dyslipidemia   Restless leg syndrome   CAD (coronary artery disease)   Dementia (HCC)   Pulmonary embolus (HCC)    ASSESSMENT AND PLAN:  JADE BURKARD is a 88 y.o. female  from [facility] rehab with a past medical history of hypertension, hyperlipidemia, hx NSTEMI (2022) treated medically, stage IV lung cancer with mets to liver and bones, recent saddle PE s/p thrombectomy (on Eliquis ), CKG stage 3a, renal artery stenosis, dementia, obesity who presented to the ED on 06/30/2024 for worsening SOB associated with productive cough and diminished appetite. Cardiology was consulted for further evaluation.   # Demand ischemia # Hypertension # Hyperlipidemia Patient without chest pain. Trops elevated and flat 71 > 190 > 630 > 760 > 720. EKG with sinus tachycardia with LBBB, rate 109 bpm with no acute ischemic changes. Has history of LBBB. Lipid panel from 02/2024 within normal limits with LDL at 51. Echo from prior admission with pEF (65-70%), no RWMA, mod LVH, interventricular flattened in diastole, mild PASP in setting of pulmonary embolism. Echo this admission with preserved EF, grade I diastolic dysfunction.  -Continue home ASA 81 mg and atorvastatin  10 mg daily.  -Continue home hydralazine  100 mg BID.  (Patient has documented allergy to ACEi/CCB).  -Elevated and flat troponin in setting of  acute on chronic respiratory failure, sepsis due to PNA, severe lung cancer is most consistent with demand/supply mismatch and not ACS   # Large Pleural Effusion # Sepsis due to multifocal pneumonia # Acute on chronic respiratory failure  # Stage IV lung cancer with mets to liver and bones # HX saddle PE s/p thrombectomy (05/2024) # CKD, stage 3a Patient with severe dyspnea.  CXR with bilateral pleural effusions, increased bilateral multifocal interstial opacities indicates pulmonary edema vs multifocal infection.  S/p duobnebs, solumedrol and IV lasix  60 mg x2 in ED. Patient on HHFNC. V?Q scan with no evidence of acute PE. Echo this admission large pleural effusion, no pericardial effusion, mod LVH, mod PASP, interventricular flattening in diastole.  -With poor UOP with IV lasix , would recommend thoracentesis.  -On Eliquis  for hx pulmonary embolism -On Abx for PNA, management per primary team.  -Pulmonology consulted, appreciate recommendations.  -Recommend palliative consult.   Patient is critically ill with metastatic cancer and multiple comorbidities. Prognosis is guarded.   This patient's plan of care was discussed and created with Dr. Florencio and he is in agreement.  Signed: Dorene Comfort, PA-C  07/03/2024, 9:55 AM [facility] Cardiology

## 2024-07-03 NOTE — Evaluation (Signed)
 Occupational Therapy Evaluation Patient Details Name: Julie Mcbride MRN: [id number] DOB: 10-05-1936 Today's Date: 07/03/2024   History of Present Illness   Pt is an 88 year old female admitted from rehab with Acute on chronic respiratory failure with hypoxia and hypercapnia, Bilateral pleural effusions     PMH significant for recently diagnosed stage Ivb adenocarcinoma of lung with mets to liver and bones, recent PE/DVT, CKD stage IIIa, renal artery stenosis, HTN, HLD, ischemic cardiomyopathy, dementia, obesity     Clinical Impressions Chart reviewed to date, pt greeted in bed with daughter present, oriented to self,place, grossly to time. She does require multi modal cues for one step direction following and cognition appears impaired compared to baseline. PTA pt was at rehab, per family they report she was amb with RW, standing for up to 5 minutes and performing ADLs with some assist. Pt presents with deficits in strength, endurance, activity tolerance, balance, cognition, affecting safe and optimal ADL completion. Bed mobility completed with CGA-MIN A, good static sitting balance for approx 5 minutes, STS with MIN A +1-2 with RW, with approx 5 lateral steps up the bed with RW with CGA-MIN A and frequent cueing for technique. Pt does have increased work of break throughout, slightly increased with Standing attempts. Spo2 >90% on 30L via HHFC. Educated pt on energy conservation techniques, mobility within tolerance. Pt will benefit from skilled OT to address functional deficits and to facilitate optimal ADL/functional mobility performance. Pt is left in modified chair position, all needs met. OT will continue to follow.      If plan is discharge home, recommend the following:   A lot of help with walking and/or transfers;A lot of help with bathing/dressing/bathroom;Help with stairs or ramp for entrance;Assistance with cooking/housework;Direct supervision/assist for medications management;Direct  supervision/assist for financial management;Assist for transportation     Functional Status Assessment   Patient has had a recent decline in their functional status and demonstrates the ability to make significant improvements in function in a reasonable and predictable amount of time.     Equipment Recommendations   Wheelchair (measurements OT)     Recommendations for Other Services         Precautions/Restrictions   Precautions Precautions: Fall Recall of Precautions/Restrictions: Impaired Restrictions Weight Bearing Restrictions Per Provider Order: No     Mobility Bed Mobility Overal bed mobility: Needs Assistance Bed Mobility: Sit to Supine, Supine to Sit     Supine to sit: HOB elevated, Used rails, Contact guard, Min assist Sit to supine: HOB elevated, Min assist (for assist with BLE)        Transfers Overall transfer level: Needs assistance Equipment used: Rolling walker (2 wheels) Transfers: Sit to/from Stand Sit to Stand: Min assist, +2 safety/equipment           General transfer comment: approx 5 lateral steps up the bed to the L with RW with CGA-MIN A +1-2; frequent multi modal cueing for sequencing/technique      Balance Overall balance assessment: Needs assistance Sitting-balance support: Feet supported, No upper extremity supported Sitting balance-Leahy Scale: Good     Standing balance support: During functional activity, Bilateral upper extremity supported, Reliant on assistive device for balance Standing balance-Leahy Scale: Fair                             ADL either performed or assessed with clinical judgement   ADL Overall ADL's : Needs assistance/impaired Eating/Feeding: Set up;Sitting  Lower Body Dressing: Maximal assistance;Sitting/lateral leans       Toileting- Clothing Manipulation and Hygiene: Maximal assistance;Sit to/from stand               Vision Patient Visual Report:  No change from baseline       Perception         Praxis         Pertinent Vitals/Pain Pain Assessment Pain Assessment: PAINAD Breathing: occasional labored breathing, short period of hyperventilation Negative Vocalization: occasional moan/groan, low speech, negative/disapproving quality Facial Expression: smiling or inexpressive Body Language: tense, distressed pacing, fidgeting Consolability: distracted or reassured by voice/touch PAINAD Score: 4 Pain Location: generalized, chest tightness after mobility Pain Descriptors / Indicators: Discomfort, Aching, Grimacing, Guarding Pain Intervention(s): Monitored during session, Limited activity within patient's tolerance, Repositioned     Extremity/Trunk Assessment Upper Extremity Assessment Upper Extremity Assessment: Generalized weakness   Lower Extremity Assessment Lower Extremity Assessment: Generalized weakness       Communication Communication Communication: No apparent difficulties   Cognition Arousal: Alert Behavior During Therapy: WFL for tasks assessed/performed, Anxious Cognition: History of cognitive impairments, Cognition impaired         Attention impairment (select first level of impairment): Sustained attention Executive functioning impairment (select all impairments): Reasoning, Problem solving, Initiation                   Following commands: Impaired Following commands impaired: Follows one step commands with increased time     Cueing  General Comments   Cueing Techniques: Verbal cues;Tactile cues;Visual cues;Gestural cues  spo2 >90% on 30L 36% FiO2 via HHFNC; pt with increased WOB throughout with noted accessory breathing; slightly increased with standing attempts but generally unchaged with position changes; HR in the 80s bpm throughout   Exercises Other Exercises Other Exercises: edu pt/family re: role of OT, role of rehab, importance of progressing mobility attempts within tolerance    Shoulder Instructions      Home Living Family/patient expects to be discharged to:: Skilled nursing facility (rehab) Living Arrangements: Spouse/significant other Available Help at Discharge: Family;Available 24 hours/day Type of Home: House Home Access: Stairs to enter [employer] of Steps: 3 in the back; front has 4 STE and a door threshold, but uses garage to walk in. Entrance Stairs-Rails:  (L rail for back entrance, R for front) Home Layout: One level     Bathroom Shower/Tub: Producer, television/film/video: Handicapped height Bathroom Accessibility: Yes   Home Equipment: Shower seat - built in;Cane - single point;Hand held Armed forces logistics/support/administrative officer (2 wheels);Grab bars - tub/shower;Rollator (4 wheels)          Prior Functioning/Environment               Mobility Comments: was amb with  RW at rehab, prior to that, [facility] for home/limited community distances ADLs Comments: prior to previous admission MOD I for ADL, assist for IADL; increased assist in the last 6 weeks due to acute illnesses    OT Problem List: Decreased strength;Decreased activity tolerance;Decreased knowledge of use of DME or AE;Decreased safety awareness;Impaired balance (sitting and/or standing)   OT Treatment/Interventions: Self-care/ADL training;Therapeutic exercise;Patient/family education;DME and/or AE instruction;Cognitive remediation/compensation;Therapeutic activities;Energy conservation;Balance training      OT Goals(Current goals can be found in the care plan section)   Acute Rehab OT Goals Patient Stated Goal: go home OT Goal Formulation: With patient/family Time For Goal Achievement: 07/17/24 Potential to Achieve Goals: Fair ADL Goals Pt Will Perform Grooming: with set-up;sitting Pt  Will Perform Lower Body Dressing: with min assist;sitting/lateral leans;sit to/from stand Pt Will Transfer to Toilet: with contact guard assist;ambulating Pt Will Perform Toileting  - Clothing Manipulation and hygiene: with min assist;sitting/lateral leans;sit to/from stand   OT Frequency:  Min 2X/week    Co-evaluation PT/OT/SLP Co-Evaluation/Treatment: Yes Reason for Co-Treatment: For patient/therapist safety;To address functional/ADL transfers;Complexity of the patient's impairments (multi-system involvement) (tolerance)   OT goals addressed during session: ADL's and self-care      AM-PAC OT 6 Clicks Daily Activity     Outcome Measure Help from another person eating meals?: None Help from another person taking care of personal grooming?: None Help from another person toileting, which includes using toliet, bedpan, or urinal?: A Lot Help from another person bathing (including washing, rinsing, drying)?: A Lot Help from another person to put on and taking off regular upper body clothing?: A Little Help from another person to put on and taking off regular lower body clothing?: A Lot 6 Click Score: 17   End of Session Equipment Utilized During Treatment: Rolling walker (2 wheels);Oxygen (HHFNC) Nurse Communication: Mobility status  Activity Tolerance: Patient limited by fatigue Patient left: in bed;with call bell/phone within reach;with bed alarm set;with nursing/sitter in room;with family/visitor present  OT Visit Diagnosis: Other abnormalities of gait and mobility (R26.89);Muscle weakness (generalized) (M62.81)                Time: 9074-9047 OT Time Calculation (min): 27 min Charges:  OT General Charges $OT Visit: 1 Visit OT Evaluation $OT Eval High Complexity: 1 High  Therisa Sheffield, OTD OTR/L  07/03/24, 10:14 AM

## 2024-07-03 NOTE — Evaluation (Signed)
 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   Clinical Impression  Pt admitted with above diagnosis. Pt currently with functional limitations due to the deficits listed below (see PT Problem List). Pt received upright in bed agreeable to PT/OT co-eval due likelihood of poor tolerance for multiple sessions. PTA, pt was ambulating with RW at STR making good progress for return home according to son.   To date, pt resting on 30 L/min HHFNC. Maintains >/= 90% at rest and with mobility. MinA needed for supine to sitting at LE's with bed features. Good sitting tolerance. Reports some anxiety with standing efforts due to WOB and fear of falling. Is able to stand to RW minA+1-2 at bedside and complete L lateral side steps to [facility] with min to mod multimodal cuing for RW mgmt with steps. Pt returns to sitting and then upright in bed minA at LE's. Notable DOE with accessory muscle use upright in bed with SPO2 > 90%. RN notified. Improves with time and seated rest. Placed in chair position for pulmonary toileting with education on benefits of upright positioning. Pt with all needs in reach in care of RN. Pt will benefit from skilled PT services < 3 hours/day to address acute deficits in endurance and weakness with functional mobility.      If plan is discharge home, recommend the following: A little help with walking and/or transfers;A little help with bathing/dressing/bathroom;Assistance with cooking/housework;Assist for transportation;Help with stairs  or ramp for entrance   Can travel by private vehicle   Yes    Equipment Recommendations Other (comment) (TBD)  Recommendations for Other Services       Functional Status Assessment       Precautions / Restrictions Precautions Precautions: Fall Recall of Precautions/Restrictions: Impaired Restrictions Weight Bearing Restrictions Per Provider Order: No      Mobility  Bed Mobility Overal bed mobility: Needs Assistance Bed Mobility: Sit to Supine, Supine to Sit     Supine to sit: HOB elevated, Used rails, Contact guard, Min assist Sit to supine: HOB elevated, Min assist   General bed mobility comments: assist with LE's Patient Response: Cooperative, Anxious  Transfers Overall transfer level: Needs assistance Equipment used: Rolling walker (2 wheels) Transfers: Sit to/from Stand Sit to Stand: Min assist, +2 safety/equipment           General transfer comment: approx 5 lateral steps up the bed to the L with RW with CGA-MIN A +1-2; frequent multi modal cueing for sequencing/technique    Ambulation/Gait                  Stairs            Wheelchair Mobility     Tilt Bed Tilt Bed Patient Response: Cooperative, Anxious  Modified Rankin (Stroke Patients Only)       Balance Overall balance assessment: Needs assistance Sitting-balance support: Feet supported, No upper extremity supported Sitting balance-Leahy Scale: Good     Standing balance support: During functional activity, Bilateral upper extremity supported, Reliant on assistive device for balance Standing balance-Leahy Scale: Fair  Pertinent Vitals/Pain Pain Assessment Pain Assessment: Faces Faces Pain Scale: Hurts a little bit Pain Location: generalized, chest tightness after mobility Pain Descriptors / Indicators: Discomfort, Aching, Grimacing, Guarding Pain Intervention(s): Monitored during session, Repositioned, Limited activity within  patient's tolerance    Home Living Family/patient expects to be discharged to:: Skilled nursing facility Living Arrangements: Spouse/significant other Available Help at Discharge: Family;Available 24 hours/day Type of Home: House Home Access: Stairs to enter Entrance Stairs-Rails:  (L rail for back entrance, R rail for front) Entrance Stairs-Number of Steps: 3 in the back; front has 4 STE and a door threshold, but uses garage to walk in.   Home Layout: One level Home Equipment: Shower seat - built in;Cane - single point;Hand held [employer] forces logistics/support/administrative officer (2 wheels);Grab bars - tub/shower;Rollator (4 wheels) Additional Comments: was ambulating at STR    Prior Function               Mobility Comments: was amb with  RW at rehab, prior to that, [facility] for home/limited community distances ADLs Comments: prior to previous admission MOD I for ADL, assist for IADL; increased assist in the last 6 weeks due to acute illnesses     Extremity/Trunk Assessment   Upper Extremity Assessment Upper Extremity Assessment: Generalized weakness    Lower Extremity Assessment Lower Extremity Assessment: Generalized weakness       Communication   Communication Communication: No apparent difficulties    Cognition Arousal: Alert Behavior During Therapy: WFL for tasks assessed/performed, Anxious   PT - Cognitive impairments: No apparent impairments                         Following commands: Impaired Following commands impaired: Follows one step commands with increased time     Cueing Cueing Techniques: Verbal cues, Tactile cues, Visual cues, Gestural cues     General Comments General comments (skin integrity, edema, etc.): SPO2 > 90% on 30 L via HHFNC. Accessory muscle use with DOE with minimal activity but SPO2 remains >/= 90%.    Exercises     Assessment/Plan    PT Assessment Patient needs continued PT services  PT Problem List Decreased  strength;Cardiopulmonary status limiting activity;Pain;Decreased range of motion;Decreased balance;Decreased activity tolerance;Decreased mobility;Decreased safety awareness;Decreased knowledge of use of DME       PT Treatment Interventions DME instruction;Balance training;Gait training;Neuromuscular re-education;Stair training;Functional mobility training;Patient/family education;Therapeutic activities;Therapeutic exercise;Manual techniques;Wheelchair mobility training;Modalities    PT Goals (Current goals can be found in the Care Plan section)  Acute Rehab PT Goals Patient Stated Goal: to go home PT Goal Formulation: With patient/family Time For Goal Achievement: 07/17/24 Potential to Achieve Goals: Fair    Frequency Min 2X/week     Co-evaluation PT/OT/SLP Co-Evaluation/Treatment: Yes Reason for Co-Treatment: For patient/therapist safety;To address functional/ADL transfers;Complexity of the patient's impairments (multi-system involvement) PT goals addressed during session: Mobility/safety with mobility;Balance;Proper use of DME;Strengthening/ROM OT goals addressed during session: ADL's and self-care       AM-PAC PT 6 Clicks Mobility  Outcome Measure Help needed turning from your back to your side while in a flat bed without using bedrails?: A Little Help needed moving from lying on your back to sitting on the side of a flat bed without using bedrails?: A Little Help needed moving to and from a bed to a chair (including a wheelchair)?: A Little Help needed standing up from a chair using your arms (e.g., wheelchair or bedside chair)?: A Little Help needed to walk in hospital  room?: A Lot Help needed climbing 3-5 steps with a railing? : A Lot 6 Click Score: 16    End of Session Equipment Utilized During Treatment: Oxygen Activity Tolerance: Other (comment) (DOE) Patient left: in bed;with call bell/phone within reach;with bed alarm set;with family/visitor present Nurse  Communication: Mobility status PT Visit Diagnosis: Muscle weakness (generalized) (M62.81);Pain;Other abnormalities of gait and mobility (R26.89);Difficulty in walking, not elsewhere classified (R26.2);Unsteadiness on feet (R26.81)    Time: 9072-9049 PT Time Calculation (min) (ACUTE ONLY): 23 min   Charges:   PT Evaluation $PT Eval Moderate Complexity: 1 Mod PT Treatments $Therapeutic Activity: 8-22 mins PT General Charges $$ ACUTE PT VISIT: 1 Visit         Dorina HERO. Fairly IV, PT, DPT Physical Therapist- [place]  [facility] 07/03/2024, 11:19 AM

## 2024-07-03 NOTE — Care Management Important Message (Signed)
 Important Message  Patient Details  Name: Julie Mcbride MRN: [id number] Date of Birth: [birth year]   Important Message Given:  Yes - Medicare IM     [name] 07/03/2024, 12:30 PM

## 2024-07-03 NOTE — Progress Notes (Signed)
 Patient found with Nasal Cannula displaced, with oxygen SAT 83% on room air. Per family member at bedside, patient talks and moves a lot in sleep. Cannula placed back on patient, increased flow to 35L and FIO2 to 35% with SAT improvement to 92%.

## 2024-07-03 NOTE — Progress Notes (Signed)
 Palliative Care Progress Note, Assessment & Plan   Patient Name: Julie Mcbride       Date: 07/03/2024 DOB: 29-Dec-1935  Age: 88 y.o. MRN#: [id number] Attending Physician: Dezii, Alexandra, DO Primary Care Physician: Alla Amis, MD Admit Date: 06/30/2024  Subjective: Patient is sitting up in bed with heated high flow nasal cannula in place.  She is awake, alert, able to acknowledge my presence, and able to make her wishes known.  Her husband, her daughter, and son-in-law are at bedside during my visit.  HPI: 88 y.o. 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    Of note, patient is familiar to our service that she met with my colleague Josh yesterday, 8/4.   Patient is being treated for acute on chronic respiratory failure with hypoxia and hypercapnia, sepsis due to multifocal pneumonia, elevated troponin, bilateral pleural effusions, recent saddle PE s/p thrombectomy with right femoral DVT, stage IV lung cancer with mets to liver and Jones, CKD stage IIIa, right anterior stenosis s/p stent, HTN, generalized weakness with debility.   PMT was consulted to support patient and family with goals of care discussions given patient's widespread metastatic disease.  Summary of counseling/coordination of care: Extensive chart review completed prior to meeting patient including labs, vital signs, imaging, progress notes, orders, and available advanced directive documents from current and previous  encounters.   After reviewing the patient's chart and assessing the patient at bedside, I spoke with patient in regards to symptom management and goals of care.   Patient endorses she wants to heated high flow nasal cannula as it is squeezing her breathing.  However, she shares she feels as though she can no longer take a deep breath.  Discussed pleural effusions.  Results of VQ scan discussed.  Education provided on thoracentesis to relieve compressive atelectasis.  Patient shares she is willing to try the thoracentesis if it will give her some relief.  However, she repeatedly said she is tired of being stuck.  Again as discussed patient has full autonomy and agency over her medical decisions.  She shares she has willing to try if it would make her feel better.   Opportunity provided for family to share concerns asked questions.  Patient's daughter shares she is hopeful that her mother can get some relief with the thoracentesis.  Patient's husband offers no questions or concerns.  I shared my great concern the patient has widespread metastatic disease that is causing the cascade effect of other symptoms, impacting her quality of life.  I again advocated that patient consider ceasing aggressive medical treatment, aging in place, focusing on comfort and aggressive symptom management, and avoiding rehospitalization's, given that none of these treatments are curative.  Patient shares that she wants to continue with thoracentesis and she will consider her options after that.  No adjustment to plan of care at this time.  PMT will continue to follow and support.  Physical Exam Vitals reviewed.  Constitutional:      General: She is not in acute distress.    Appearance: She is normal weight.  HENT:     Head: Normocephalic.     Nose: Nose normal.     Mouth/Throat:     Mouth: Mucous membranes are moist.  Eyes:     Pupils: Pupils are equal, round, and reactive to light.  Cardiovascular:      Rate and Rhythm: Normal rate.  Pulmonary:     Comments: Conversational dyspnea, SOB Skin:    General: Skin is warm.     Coloration: Skin is pale.  Neurological:     Mental Status: She is alert and oriented to person, place, and time.             Total Time 50 minutes   Time spent includes: Detailed review of medical records (labs, imaging, vital signs), medically appropriate exam (mental status, respiratory, cardiac, skin), discussed with treatment team, counseling and educating patient, family and staff, documenting clinical information, medication management and coordination of care.  Lamarr L. Arvid, DNP, FNP-BC Palliative Medicine Team

## 2024-07-04 DIAGNOSIS — J189 Pneumonia, unspecified organism: Secondary | ICD-10-CM | POA: Diagnosis not present

## 2024-07-04 DIAGNOSIS — R0602 Shortness of breath: Secondary | ICD-10-CM | POA: Diagnosis not present

## 2024-07-04 DIAGNOSIS — Z9889 Other specified postprocedural states: Secondary | ICD-10-CM | POA: Diagnosis not present

## 2024-07-04 DIAGNOSIS — C3490 Malignant neoplasm of unspecified part of unspecified bronchus or lung: Secondary | ICD-10-CM

## 2024-07-04 DIAGNOSIS — J9601 Acute respiratory failure with hypoxia: Secondary | ICD-10-CM | POA: Diagnosis not present

## 2024-07-04 DIAGNOSIS — J90 Pleural effusion, not elsewhere classified: Secondary | ICD-10-CM | POA: Diagnosis not present

## 2024-07-04 DIAGNOSIS — A419 Sepsis, unspecified organism: Secondary | ICD-10-CM | POA: Diagnosis not present

## 2024-07-04 LAB — BASIC METABOLIC PANEL WITH GFR
Anion gap: 13 (ref 5–15)
BUN: 63 mg/dL — ABNORMAL HIGH (ref 8–23)
CO2: 28 mmol/L (ref 22–32)
Calcium: 9 mg/dL (ref 8.9–10.3)
Chloride: 103 mmol/L (ref 98–111)
Creatinine, Ser: 1.41 mg/dL — ABNORMAL HIGH (ref 0.44–1.00)
GFR, Estimated: 37 mL/min — ABNORMAL LOW (ref >60)
Glucose, Bld: 160 mg/dL — ABNORMAL HIGH (ref 70–99)
Potassium: 4.2 mmol/L (ref 3.5–5.1)
Sodium: 144 mmol/L (ref 135–145)

## 2024-07-04 MED ORDER — FLEET ENEMA RE ENEM
1.0000 | ENEMA | Freq: Once | RECTAL | Status: AC
  Administered 2024-07-04: 1 via RECTAL

## 2024-07-04 MED ORDER — ALPRAZOLAM 0.25 MG PO TABS
0.2500 mg | ORAL_TABLET | Freq: Three times a day (TID) | ORAL | Status: DC | PRN
  Administered 2024-07-04 – 2024-07-07 (×3): 0.25 mg via ORAL
  Filled 2024-07-04 (×3): qty 1

## 2024-07-04 MED ORDER — LACTULOSE 10 GM/15ML PO SOLN
20.0000 g | Freq: Three times a day (TID) | ORAL | Status: DC
  Administered 2024-07-04 – 2024-07-05 (×2): 20 g via ORAL
  Filled 2024-07-04 (×7): qty 30

## 2024-07-04 NOTE — Progress Notes (Signed)
 Palliative Care Progress Note, Assessment & Plan   Patient Name: Julie Mcbride       Date: 07/04/2024 DOB: 06/22/1936  Age: 88 y.o. MRN#: [id number] Attending Physician: Dezii, Alexandra, DO Primary Care Physician: Alla Amis, MD Admit Date: 06/30/2024  Subjective: Patient is sitting up in bed with neck pillow and heated high flow nasal cannula in place.  She is asleep, briefly awakens to my presence to say hello, and returns back to sleep.  Her son Julie Mcbride is at bedside during my visit.  HPI: 88 y.o. 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    Of note, patient is familiar to our service that she met with my colleague Josh yesterday, 8/4.   Patient is being treated for acute on chronic respiratory failure with hypoxia and hypercapnia, sepsis due to multifocal pneumonia, elevated troponin, bilateral pleural effusions, recent saddle PE s/p thrombectomy with right femoral DVT, stage IV lung cancer with mets to liver and Jones, CKD stage IIIa, right anterior stenosis s/p stent, HTN, generalized weakness with debility.   PMT was consulted to support patient and family with goals of care discussions given patient's widespread metastatic disease.   Summary of counseling/coordination of care: Extensive chart review completed prior to meeting patient including labs, vital signs, imaging, progress notes, orders, and available advanced directive documents from current and previous encounters.    After reviewing the patient's chart and assessing the patient at bedside, I spoke with patient and her son in regards to symptom management and goals of care.   Patient speaks a few words during my visit.  She says I am trying.  Patient's son is supportive and encouraging to the patient, saying she is doing great.  Discussed that patient has not required increased amounts of heated high flow nasal cannula but remains needing extensive supplemental oxygen support.  Patient's son Julie Mcbride shares that patient endorsed relief after thoracentesis yesterday.  Patient nodded her head in agreement.  Patient with pursed lipped breathing and conversational dyspnea.  The rest of our discussion is between patient's son and myself.  Julie Mcbride shares that he is trying to be reasonable and understanding the reality of his mother's current medical situation.  I again discussed that patient has widespread metastatic disease that will not be improved unless treatment is given.  However, treatment is not an option at this point given patient's poor functional status.  He shares understanding.  He shares she is just hopeful to do what he can to get her follow, see if she can improve, and go from there.  Discussed that holding hope and reality her equally important.  Reviewed that plan of care will continue to be to treat the treatable with hopes of patient returning home.  Julie Mcbride was appreciative of my visit.  No adjustment to plan of care at this time.  PMT will continue to follow and support.  Physical Exam Vitals reviewed.  Constitutional:  General: She is not in acute distress.    Appearance: She is normal weight.  HENT:     Head: Normocephalic.  Eyes:     Pupils: Pupils are equal, round, and reactive to light.  Cardiovascular:     Rate and Rhythm: Normal rate.  Pulmonary:     Effort: No respiratory distress.     Breath sounds: No wheezing.     Comments: HHFNC Musculoskeletal:     Comments:  Generalized weakness  Skin:    Coloration: Skin is pale.  Neurological:     Mental Status: She is oriented to person, place, and time.  Psychiatric:        Mood and Affect: Mood normal.        Behavior: Behavior normal.             Total Time 35 minutes   Time spent includes: Detailed review of medical records (labs, imaging, vital signs), medically appropriate exam (mental status, respiratory, cardiac, skin), discussed with treatment team, counseling and educating patient, family and staff, documenting clinical information, medication management and coordination of care.  Lamarr L. Arvid, DNP, FNP-BC Palliative Medicine Team

## 2024-07-04 NOTE — Progress Notes (Signed)
 [facility] CLINIC CARDIOLOGY PROGRESS NOTE       Patient ID: Julie Mcbride MRN: [id number] DOB/AGE: 1935/12/24 88 y.o.  Admit date: 06/30/2024 Referring Physician Dr. Awanda Primary Physician Alla Amis, MD Primary Cardiologist Dr. Florencio (2022) Reason for Consultation severe dyspnea  HPI: Julie Mcbride is a 88 y.o. female  from [facility] rehab with a past medical history of hypertension, hyperlipidemia, hx NSTEMI (2022) treated medically, stage IV lung cancer with mets to liver and bones, recent saddle PE s/p thrombectomy 05/2024 (on Eliquis ), CKG stage 3a, renal artery stenosis, dementia, obesity who presented to the ED on 06/30/2024 for worsening SOB associated with productive cough and diminished appetite. Cardiology was consulted for further evaluation.   Interval History: -Patient seen and examined this AM and laying comfortably in hospital bed with family at bedside. Patient states breathing feels the same today and continues to deny chest pain or palpitations.  -Patient states she felt her breathing improve yesterday s/p thoracentesis, 1.4L -Patients BP and HR stable this AM. Overnight Tele showed no significant events.  -Patient remains on HHFNC 35 with stable SpO2.  -Palliative following.   Review of systems complete and found to be negative unless listed above    Past Medical History:  Diagnosis Date   Allergy    Anemia    Arthritis    Knees, hands   Basal cell carcinoma    back - removed   Bladder polyp    With previos hematuria   Diverticulitis    Dyspnea    Dyspnea on exertion    GERD (gastroesophageal reflux disease)    History of ETT    Myoview 7/10 EF 78%, no ischemia or infarction. Poor exercise tolerance (2'30, stopped due to fatigue). Reached 86% MPHR.   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 IVAR Mori   BACK SURGERY     BASAL CELL CARCINOMA EXCISION     back   BREAST BIOPSY Right 06/19/2017   Affirm Bx- Radial Scar   BREAST EXCISIONAL BIOPSY Right 1996   neg   BREAST LUMPECTOMY WITH NEEDLE LOCALIZATION Right 08/11/2017   Procedure: BREAST LUMPECTOMY WITH NEEDLE LOCALIZATION;  Surgeon: Claudene Larinda Bolder, MD;  Location: [facility] ORS;  Service: General;  Laterality: Right;   CARPAL TUNNEL RELEASE Right    CATARACT EXTRACTION W/ INTRAOCULAR LENS  IMPLANT, BILATERAL     EXCISION OF BREAST BIOPSY Right 08/11/2017   excision of radial Scar   EYE SURGERY     JOINT REPLACEMENT Right    knee   KNEE ARTHROSCOPY Right 04/22/2016   Procedure: ARTHROSCOPY KNEE;  Surgeon: Helayne Glenn, MD;  Location: [facility];  Service: Orthopedics;  Laterality: Right;   knee replacement Right 01/11/2017   NASAL SINUS SURGERY  06/05/14   Dr. Milissa   POSTERIOR CERVICAL LAMINECTOMY  2005   C5-7; plate and screws   PULMONARY THROMBECTOMY Bilateral 06/11/2024   Procedure: PULMONARY THROMBECTOMY;  Surgeon: Jama Cordella MATSU, MD;  Location: [facility];  Service: Cardiovascular;  Laterality: Bilateral;   RENAL ANGIOGRAPHY Left 04/20/2020   Procedure: RENAL ANGIOGRAPHY;  Surgeon: Marea Selinda RAMAN, MD;  Location: [facility] CV [facility];  Service: Cardiovascular;  Laterality: Left;   TONSILLECTOMY     VIDEO BRONCHOSCOPY WITH ENDOBRONCHIAL NAVIGATION N/A 05/24/2024   Procedure: VIDEO BRONCHOSCOPY WITH ENDOBRONCHIAL NAVIGATION;  Surgeon: Parris Manna, MD;  Location: [facility];  Service: Thoracic;  Laterality: N/A;   VIDEO BRONCHOSCOPY WITH ENDOBRONCHIAL ULTRASOUND N/A 05/24/2024   Procedure: BRONCHOSCOPY, WITH EBUS;  Surgeon: Parris Manna,  MD;  Location: [facility];  Service: Thoracic;  Laterality: N/A;    Medications Prior to Admission  Medication Sig Dispense Refill Last Dose/Taking   acetaminophen  (TYLENOL ) 500 MG tablet Take 1,000 mg by mouth every 8 (eight) hours as needed.   06/21/2024   ACETAMINOPHEN  PO Take 650 mg by mouth every 8 (eight) hours.   06/30/2024 at  2:12 PM   Acidophilus Lactobacillus CAPS Take 1 capsule by mouth daily.   06/30/2024 at  8:13 AM   albuterol  (PROVENTIL ) (2.5 MG/3ML) 0.083% nebulizer solution Inhale 3 mLs into the lungs every 6 (six) hours as needed for wheezing or shortness of breath.   06/30/2024 at 12:02 PM   ALPRAZolam  (XANAX ) 0.25 MG tablet Take 0.25 mg by mouth 3 (three) times daily as needed for anxiety.   06/30/2024 at  2:46 PM   apixaban  (ELIQUIS ) 5 MG TABS tablet Take 1 tablet (5 mg total) by mouth 2 (two) times daily.   06/30/2024 at  4:22 PM   aspirin  EC 81 MG tablet Take 1 tablet (81 mg total) by mouth daily. Swallow whole.   06/30/2024 at  8:13 AM   atorvastatin  (LIPITOR) 10 MG tablet Take 1 tablet (10 mg total) by mouth daily. 30 tablet 11 06/30/2024 at  5:24 PM   bisoprolol  (ZEBETA ) 10 MG tablet Take 1 tablet (10 mg total) by mouth daily.   06/30/2024 at  8:13 AM   cetirizine (ZYRTEC) 10 MG tablet Take 10 mg by mouth as needed.   06/30/2024 at  8:13 AM   Cholecalciferol  (VITAMIN D ) 50 MCG (2000 UT) tablet Take 4,000 Units by mouth daily.    06/30/2024 at  8:13 AM   doxycycline (VIBRAMYCIN) 100 MG capsule Take 100 mg by mouth 2 (two) times daily.   06/30/2024 at  4:22 PM   estradiol  (ESTRACE  VAGINAL) 0.1 MG/GM vaginal cream Apply 0.5mg  (pea-sized amount)  just inside the vaginal introitus with a finger-tip on Monday, Wednesday and Friday nights. 30 g 12 06/28/2024   fluticasone  (FLONASE ) 50 MCG/ACT nasal spray Place 2 sprays into both nostrils as needed for allergies or rhinitis.   06/29/2024 at 10:41 AM   furosemide  (LASIX ) 20 MG tablet Take 20 mg by mouth daily.   06/30/2024 at  8:13 AM   guaiFENesin   (ROBITUSSIN) 100 MG/5ML liquid Take 5 mLs by mouth every 4 (four) hours as needed for cough or to loosen phlegm.   Unknown   hydrALAZINE  (APRESOLINE ) 50 MG tablet Take 2 tablets (100 mg total) by mouth in the morning and at bedtime. TAKE 2 TABLETS BY MOUTH EVERY MORNING TAKE ONE TABLET MID-DAY AND TAKE ONE TABLET BEFORE BED   06/30/2024 at  2:12 PM   lidocaine  4 % Place 1 patch onto the skin daily. REMOVE & DISCARD PATCH WITHIN 12 HOURS OR AS DIRECTED BY MD   06/29/2024 at  9:13 PM   methocarbamol  (ROBAXIN ) 500 MG tablet Take 1 tablet (500 mg total) by mouth every  6 (six) hours as needed for muscle spasms. 30 tablet 0 06/30/2024 at  4:21 PM   oxyCODONE -acetaminophen  (PERCOCET/ROXICET) 5-325 MG tablet Take 1-2 tablets by mouth every 6 (six) hours as needed for moderate pain (pain score 4-6) or severe pain (pain score 7-10). (Patient taking differently: Take 1 tablet by mouth every 6 (six) hours as needed for moderate pain (pain score 4-6) or severe pain (pain score 7-10).) 12 tablet 0 06/30/2024 at  4:21 PM   polyethylene glycol (MIRALAX  / GLYCOLAX ) 17 g packet Take 17 g by mouth 2 (two) times daily.   06/30/2024 at  8:13 AM   rOPINIRole  (REQUIP ) 0.25 MG tablet Take 0.5 mg by mouth 3 (three) times daily.   06/30/2024 at  4:22 PM   senna-docusate (SENOKOT-S) 8.6-50 MG tablet Take 1 tablet by mouth 2 (two) times daily.   06/30/2024 at  8:13 AM   triamcinolone  ointment (KENALOG ) 0.1 % Apply topically 2 (two) times daily as needed.   Unknown   Social History   Socioeconomic History   Marital status: Married    Spouse name: Not on file   Number of children: Not on file   Years of education: Not on file   Highest education level: Not on file  Occupational History   Occupation: Retired  Tobacco Use   Smoking status: Never   Smokeless tobacco: Never  Vaping Use   Vaping status: Never Used  Substance and Sexual Activity   Alcohol use: No   Drug use: No   Sexual activity: Not on file  Other Topics Concern   Not  on file  Social History Narrative   Lives with husband (married)   Social Drivers of Corporate investment banker Strain: Low Risk  (05/15/2024)   Received from [facility]   Overall Financial Resource Strain (CARDIA)    Difficulty of Paying Living Expenses: Not hard at all  Food Insecurity: No Food Insecurity (07/01/2024)   Hunger Vital Sign    Worried About Running Out of Food in the Last Year: Never true    Ran Out of Food in the Last Year: Never true  Transportation Needs: No Transportation Needs (07/01/2024)   PRAPARE - Administrator, Civil Service (Medical): No    Lack of Transportation (Non-Medical): No  Physical Activity: Not on file  Stress: Not on file  Social Connections: Socially Integrated (07/01/2024)   Social Connection and Isolation Panel    Frequency of Communication with Friends and Family: More than three times a week    Frequency of Social Gatherings with Friends and Family: More than three times a week    Attends Religious Services: More than 4 times per year    Active Member of [employer] or Organizations: Yes    Attends Engineer, structural: More than 4 times per year    Marital Status: Married  Catering manager Violence: Not At Risk (07/01/2024)   Humiliation, Afraid, Rape, and Kick questionnaire    Fear of Current or Ex-Partner: No    Emotionally Abused: No    Physically Abused: No    Sexually Abused: No    Family History  Problem Relation Age of Onset   Ovarian cancer Mother    Cancer Mother    Stroke Father        CVA   Breast cancer Daughter 41   Breast cancer Maternal Aunt 60   Breast cancer Maternal Aunt 90   Prostate cancer Neg Hx  Bladder Cancer Neg Hx    Kidney cancer Neg Hx      Vitals:   07/03/24 1947 07/04/24 0258 07/04/24 0333 07/04/24 0736  BP:   (!) 133/58 (!) 147/63  Pulse:   73 75  Resp:   18   Temp:   97.7 F (36.5 C) 98 F (36.7 C)  TempSrc:      SpO2: 97% 96% 97% 95%  Height:         PHYSICAL EXAM General: chronically ill appearing elderly female, well nourished, in mild distress. HEENT: Normocephalic and atraumatic. Neck: No JVD.   Lungs: Normal respiratory effort on HHFNC. Diminished breath sounds bilaterally. Heart: HRRR. Normal S1 and S2 without gallops or murmurs.  Abdomen: Non-distended appearing.  Msk: Normal strength and tone for age. Extremities: Warm and well perfused. No clubbing, cyanosis, edema.  Neuro: Alert and oriented X 3. Psych: Answers questions appropriately.   Labs: Basic Metabolic Panel: Recent Labs    07/02/24 0458 07/04/24 0434  NA 142 144  K 4.0 4.2  CL 105 103  CO2 27 28  GLUCOSE 165* 160*  BUN 46* 63*  CREATININE 1.49* 1.41*  CALCIUM  9.0 9.0  MG 2.1  --    Liver Function Tests: No results for input(s): AST, ALT, ALKPHOS, BILITOT, PROT, ALBUMIN  in the last 72 hours.  No results for input(s): LIPASE, AMYLASE in the last 72 hours. CBC: Recent Labs    07/02/24 0458 07/03/24 0531  WBC 17.0* 16.1*  HGB 9.6* 9.5*  HCT 30.8* 29.6*  MCV 93.3 92.2  PLT 207 187   Cardiac Enzymes: Recent Labs    07/01/24 1013  TROPONINIHS 724*   BNP: No results for input(s): BNP in the last 72 hours.  D-Dimer: No results for input(s): DDIMER in the last 72 hours. Hemoglobin A1C: No results for input(s): HGBA1C in the last 72 hours. Fasting Lipid Panel: No results for input(s): CHOL, HDL, LDLCALC, TRIG, CHOLHDL, LDLDIRECT in the last 72 hours. Thyroid Function Tests: No results for input(s): TSH, T4TOTAL, T3FREE, THYROIDAB in the last 72 hours.  Invalid input(s): FREET3 Anemia Panel: No results for input(s): VITAMINB12, FOLATE, FERRITIN, TIBC, IRON, RETICCTPCT in the last 72 hours.   Radiology: [facility] Chest Port 1 View Result Date: 07/03/2024 CLINICAL DATA:  Status post thoracentesis. EXAM: PORTABLE CHEST 1 VIEW COMPARISON:  07/02/2024 and CT chest 06/11/2024. FINDINGS:  Patient's head obscures the superior mediastinum and medial apices. Heart is enlarged, stable. Bibasilar collapse/consolidation with bilateral pleural effusions, minimally decreased on the right, status post thoracentesis. No definite pneumothorax. IMPRESSION: 1. No definite pneumothorax status post right thoracentesis. 2. Bibasilar collapse/consolidation with moderate bilateral pleural effusions, minimally decreased on the right. 3. Pulmonary metastatic disease better seen on CT chest 06/11/2024. Electronically Signed   By: Newell Eke M.D.   On: 07/03/2024 14:01   US  THORACENTESIS ASP PLEURAL SPACE W/IMG GUIDE Result Date: 07/03/2024 INDICATION: 88 year old female with history of stage IVb lung cancer, with new onset right-sided pleural effusion. IR was requested for diagnostic and therapeutic thoracentesis. EXAM: ULTRASOUND GUIDED DIAGNOSTIC AND THERAPEUTIC THORACENTESIS MEDICATIONS: 6 cc of 1% lidocaine  COMPLICATIONS: None immediate. PROCEDURE: An ultrasound guided thoracentesis was thoroughly discussed with the patient and questions answered. The benefits, risks, alternatives and complications were also discussed. The patient understands and wishes to proceed with the procedure. Written consent was obtained. Ultrasound was performed to localize and mark an adequate pocket of fluid in the right chest. The area was then prepped and draped in the normal sterile fashion.  1% Lidocaine  was used for local anesthesia. Under ultrasound guidance a 6 Fr Safe-T-Centesis catheter was introduced. Thoracentesis was performed. The catheter was removed and a dressing applied. FINDINGS: A total of approximately 1.4 L of clear, dark amber versus blood-tinged pleural fluid was removed. Samples were sent to the laboratory as requested by the clinical team. IMPRESSION: Successful ultrasound guided right thoracentesis yielding 1.4 L of pleural fluid. Procedure performed by Carlin Griffon, PA-C Electronically Signed   By: CHRISTELLA.   Shick M.D.   On: 07/03/2024 13:06   ECHOCARDIOGRAM LIMITED Result Date: 07/03/2024    ECHOCARDIOGRAM LIMITED REPORT   Patient Name:   Julie Mcbride Date of Exam: 07/02/2024 Medical Rec #:  [id number]        Height:       66.0 in Accession #:    [id number]       Weight:       216.0 lb Date of Birth:  27-Jan-1936       BSA:          2.067 m Patient Age:    87 years         BP:           124/56 mmHg Patient Gender: F                HR:           80 bpm. Exam Location:  [facility] Procedure: Limited Echo, Cardiac Doppler and Color Doppler (Both Spectral and            Color Flow Doppler were utilized during procedure). Indications:     Dyspnea  History:         Patient has prior history of Echocardiogram examinations, most                  recent 06/11/2024. Signs/Symptoms:Dyspnea and Shortness of                  Breath; Risk Factors:Hypertension.  Sonographer:     Philomena Daring Referring Phys:  [id number] DORENE Georgie Eduardo Diagnosing Phys: Dwayne D Callwood MD IMPRESSIONS  1. Large pleural effusion no significant pericardial effusion.  2. Limited ECHO images.  3. Left ventricular ejection fraction, by estimation, is 65 to 70%. The left ventricle has normal function. Left ventricular diastolic parameters are consistent with Grade I diastolic dysfunction (impaired relaxation). There is the interventricular septum is flattened in diastole ('D' shaped left ventricle), consistent with right ventricular volume overload.  4. Right ventricular systolic function is normal. The right ventricular size is moderately enlarged. Mildly increased right ventricular wall thickness. There is moderately elevated pulmonary artery systolic pressure.  5. Large pleural effusion in the left lateral region.  6. The mitral valve is normal in structure. No evidence of mitral valve regurgitation.  7. The aortic valve was not well visualized. Aortic valve regurgitation is not visualized. Conclusion(s)/Recommendation(s): Poor windows for evaluation of left  ventricular function by transthoracic echocardiography. Would recommend an alternative means of evaluation. FINDINGS  Left Ventricle: Left ventricular ejection fraction, by estimation, is 65 to 70%. The left ventricle has normal function. The left ventricular internal cavity size was normal in size. There is no concentric left ventricular hypertrophy. The interventricular septum is flattened in diastole ('D' shaped left ventricle), consistent with right ventricular volume overload. Left ventricular diastolic parameters are consistent with Grade I diastolic dysfunction (impaired relaxation). Right Ventricle: The right ventricular size is moderately enlarged. Mildly increased right ventricular wall thickness. Right ventricular systolic function  is normal. There is moderately elevated pulmonary artery systolic pressure. Left Atrium: Left atrial size was normal in size. Right Atrium: Right atrial size was normal in size. Pericardium: There is no evidence of pericardial effusion. Mitral Valve: The mitral valve is normal in structure. Tricuspid Valve: The tricuspid valve is not well visualized. Tricuspid valve regurgitation is not demonstrated. Aortic Valve: The aortic valve was not well visualized. Aortic valve regurgitation is not visualized. Pulmonic Valve: The pulmonic valve was not well visualized. Aorta: The ascending aorta was not well visualized. Additional Comments: Limited ECHO images. Large pleural effusion no significant pericardial effusion. There is a large pleural effusion in the left lateral region. Spectral Doppler performed. Color Doppler performed.  LEFT VENTRICLE PLAX 2D LVIDd:         3.90 cm   Diastology LVIDs:         2.50 cm   LV e' medial: 3.70 cm/s LV PW:         1.00 cm LV IVS:        1.00 cm LVOT diam:     1.90 cm LV SV:         90 LV SV Index:   43 LVOT Area:     2.84 cm  RIGHT VENTRICLE            IVC RV S prime:     9.79 cm/s  IVC diam: 1.90 cm TAPSE (M-mode): 1.8 cm LEFT ATRIUM          Index LA diam:    3.00 cm 1.45 cm/m  AORTIC VALVE LVOT Vmax:   151.00 cm/s LVOT Vmean:  104.000 cm/s LVOT VTI:    0.316 m  SHUNTS Systemic VTI:  0.32 m Systemic Diam: 1.90 cm Cara JONETTA Lovelace MD Electronically signed by Cara JONETTA Lovelace MD Signature Date/Time: 07/03/2024/7:33:59 AM    Final    NM Pulmonary Perfusion Result Date: 07/02/2024 CLINICAL DATA:  Pulmonary embolism, high probability EXAM: NUCLEAR MEDICINE PERFUSION LUNG SCAN TECHNIQUE: Perfusion images were obtained in multiple projections after intravenous injection of radiopharmaceutical. Ventilation scans intentionally deferred if perfusion scan and chest x-ray adequate for interpretation during COVID 19 epidemic. RADIOPHARMACEUTICALS:  4.33 mCi Tc-18m MAA IV COMPARISON:  None. Findings are correlated with prior chest radiograph of 07/02/2024 FINDINGS: Moderate right and small left pleural effusions are noted on prior chest radiograph. Defects are noted involving the posterior basal aspects of the lungs bilaterally related to these pleural effusions. Linear defect within the lungs bilaterally likely reflect fluid within the fissures. Small subsegmental defect within the right apex is nonspecific. Altogether, the findings do not support the presence of underlying acute pulmonary embolus. IMPRESSION: No pulmonary embolism. Multiple defects related to bilateral pleural effusions, right greater than left. Electronically Signed   By: Dorethia Molt M.D.   On: 07/02/2024 16:09   DG Chest Port 1 View Result Date: 07/02/2024 CLINICAL DATA:  Suspected pulmonary embolism. EXAM: PORTABLE CHEST - 1 VIEW COMPARISON:  06/30/2024. FINDINGS: Unchanged appearance of the cardiomediastinal silhouette. Unchanged mild pulmonary vascular congestion. Known diffuse metastatic disease of the lungs are better seen on recent chest CT from 06/11/2024. There is unchanged large RIGHT and small LEFT pleural effusions with adjacent airspace opacity. IMPRESSION: No significant  interval change in appearance of the lungs with large RIGHT and small LEFT pleural effusions with adjacent atelectasis/pneumonia. Electronically Signed   By: Aliene Lloyd M.D.   On: 07/02/2024 14:34   DG Chest Port 1 View Result Date: 06/30/2024 CLINICAL DATA:  Shortness of breath. EXAM: PORTABLE CHEST 1 VIEW COMPARISON:  06/14/2024. FINDINGS: The heart size and mediastinal contours are unchanged. Moderate right and small to moderate left pleural effusions, increased since the prior exam with associated bibasilar atelectasis. Increased bilateral multifocal interstitial opacities. No pneumothorax. Cervical fixation hardware. No acute osseous abnormality. IMPRESSION: 1. Moderate right and small to moderate left pleural effusions, increased since the prior exam with associated bibasilar atelectasis. 2. Increased bilateral multifocal interstitial opacities could reflect pulmonary edema or multifocal infection. Electronically Signed   By: Harrietta Sherry M.D.   On: 06/30/2024 19:34   MR BRAIN W WO CONTRAST Result Date: 06/16/2024 EXAM: MRI BRAIN WITH AND WITHOUT CONTRAST 06/16/2024 06:30:00 PM TECHNIQUE: Multiplanar multisequence MRI of the head/brain was performed with and without the administration of intravenous contrast. COMPARISON: 06/07/2023 CLINICAL HISTORY: Cancer staging. Lung CA. Eval for mets. FINDINGS: BRAIN AND VENTRICLES: No acute infarct. No acute intracranial hemorrhage. No mass effect or midline shift. No hydrocephalus. The sella is unremarkable. Normal flow voids. No mass or abnormal enhancement. Multifocal hyperintense T2-weighted signal within the cerebral white matter, most commonly due to chronic small vessel disease. Generalized volume loss. 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 ORBITS: No acute abnormality. SINUSES: No acute abnormality. BONES AND SOFT TISSUES: Normal bone marrow signal and enhancement. No acute soft tissue abnormality. IMPRESSION: 1. No acute intracranial  abnormality. 2. Multifocal hyperintense T2-weighted signal within the cerebral white matter, most commonly due to chronic small vessel disease. 3. Generalized volume loss. 4. 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 Electronically signed by: Franky Stanford MD 06/16/2024 08:35 PM EDT RP Workstation: [id number]   DG Chest Port 1 View Result Date: 06/14/2024 CLINICAL DATA:  Cough and breathing difficulty EXAM: PORTABLE CHEST 1 VIEW COMPARISON:  Chest radiograph dated 06/10/2024 FINDINGS: Low lung volumes with bronchovascular crowding. Increased patchy bibasilar opacities and mild interstitial opacities. Similar trace bilateral pleural effusions. No pneumothorax. Similar mildly enlarged cardiomediastinal silhouette. Cervical spinal fixation hardware appears intact. IMPRESSION: 1. Increased patchy bibasilar opacities and mild interstitial opacities, which may represent pulmonary edema or multifocal infection. 2. Similar trace bilateral pleural effusions. Electronically Signed   By: Limin  Xu M.D.   On: 06/14/2024 13:41   DG HIP UNILAT WITH PELVIS 2-3 VIEWS RIGHT Result Date: 06/14/2024 CLINICAL DATA:  [id number] Hip pain [id number] EXAM: DG HIP (WITH OR WITHOUT PELVIS) 2-3V RIGHT COMPARISON:  March 09, 2023 FINDINGS: No evidence of pelvic fracture or diastasis.No acute hip fracture or dislocation.Moderate joint space loss of the left hip. Degenerative disc disease of the visualized lumbar spine.Soft tissues are unremarkable. IMPRESSION: 1. No acute fracture, pelvic bone diastasis, or dislocation. 2. Moderate osteoarthritis of the left hip. Electronically Signed   By: Rogelia Myers M.D.   On: 06/14/2024 13:20   ECHOCARDIOGRAM COMPLETE Result Date: 06/11/2024    ECHOCARDIOGRAM REPORT   Patient Name:   Julie Mcbride Date of Exam: 06/11/2024 Medical Rec #:  [id number]        Height:       65.0 in Accession #:    [id number]       Weight:       210.9 lb Date of Birth:  01/04/1936       BSA:          2.023 m  Patient Age:    87 years         BP:           113/66 mmHg Patient Gender: F  HR:           91 bpm. Exam Location:  [facility] Procedure: 2D Echo, Cardiac Doppler and Color Doppler (Both Spectral and Color            Flow Doppler were utilized during procedure). Indications:     pulmonary embolus I26.09  History:         Patient has prior history of Echocardiogram examinations, most                  recent 12/26/2020. Previous Myocardial Infarction,                  Signs/Symptoms:Dyspnea; Risk Factors:Hypertension.  Sonographer:     Christopher Furnace Referring Phys:  [id number] JAN A MANSY Diagnosing Phys: Lonni Hanson MD IMPRESSIONS  1. Left ventricular ejection fraction, by estimation, is 65 to 70%. The left ventricle has normal function. The left ventricle has no regional wall motion abnormalities. There is moderate left ventricular hypertrophy. Left ventricular diastolic parameters are indeterminate. There is the interventricular septum is flattened in diastole ('D' shaped left ventricle), consistent with right ventricular volume overload.  2. Pulmonary artery pressure is mildly elevated (RVSP 35 mmHg plus central venous/right atrial pressure). Right ventricular systolic function is moderately reduced. The right ventricular size is moderately enlarged.  3. The mitral valve is degenerative. Trivial mitral valve regurgitation. No evidence of mitral stenosis.  4. The aortic valve is tricuspid. Aortic valve regurgitation is not visualized. No aortic stenosis is present. FINDINGS  Left Ventricle: Left ventricular ejection fraction, by estimation, is 65 to 70%. The left ventricle has normal function. The left ventricle has no regional wall motion abnormalities. The left ventricular internal cavity size was normal in size. There is  moderate left ventricular hypertrophy. The interventricular septum is flattened in diastole ('D' shaped left ventricle), consistent with right ventricular volume overload. Left ventricular  diastolic parameters are indeterminate. Right Ventricle: Pulmonary artery pressure is mildly elevated (RVSP 35 mmHg plus central venous/right atrial pressure). The right ventricular size is moderately enlarged. No increase in right ventricular wall thickness. Right ventricular systolic function is moderately reduced. Left Atrium: Left atrial size was normal in size. Right Atrium: Right atrial size was normal in size. Pericardium: There is no evidence of pericardial effusion. Mitral Valve: The mitral valve is degenerative in appearance. There is mild thickening of the mitral valve leaflet(s). Mild mitral annular calcification. Trivial mitral valve regurgitation. No evidence of mitral valve stenosis. Tricuspid Valve: The tricuspid valve is normal in structure. Tricuspid valve regurgitation is mild. Aortic Valve: The aortic valve is tricuspid. Aortic valve regurgitation is not visualized. No aortic stenosis is present. Aortic valve mean gradient measures 3.0 mmHg. Aortic valve peak gradient measures 5.2 mmHg. Aortic valve area, by VTI measures 2.53 cm. Pulmonic Valve: The pulmonic valve was not well visualized. Pulmonic valve regurgitation is not visualized. No evidence of pulmonic stenosis. Aorta: The aortic root is normal in size and structure. Pulmonary Artery: The pulmonary artery is not well seen. Venous: The inferior vena cava was not well visualized. IAS/Shunts: The interatrial septum was not well visualized.  LEFT VENTRICLE PLAX 2D LVIDd:         3.30 cm   Diastology LVIDs:         2.20 cm   LV e' medial:    4.79 cm/s LV PW:         1.20 cm   LV E/e' medial:  15.6 LV IVS:  1.38 cm   LV e' lateral:   11.50 cm/s LVOT diam:     2.00 cm   LV E/e' lateral: 6.5 LV SV:         44 LV SV Index:   22 LVOT Area:     3.14 cm  RIGHT VENTRICLE RV Basal diam:  4.00 cm RV Mid diam:    4.00 cm LEFT ATRIUM             Index        RIGHT ATRIUM           Index LA diam:        3.00 cm 1.48 cm/m   RA Area:     17.90 cm LA  Vol (A2C):   42.0 ml 20.76 ml/m  RA Volume:   51.30 ml  25.36 ml/m LA Vol (A4C):   18.7 ml 9.24 ml/m LA Biplane Vol: 29.2 ml 14.43 ml/m  AORTIC VALVE AV Area (Vmax):    2.51 cm AV Area (Vmean):   2.20 cm AV Area (VTI):     2.53 cm AV Vmax:           114.00 cm/s AV Vmean:          88.200 cm/s AV VTI:            0.174 m AV Peak Grad:      5.2 mmHg AV Mean Grad:      3.0 mmHg LVOT Vmax:         91.00 cm/s LVOT Vmean:        61.700 cm/s LVOT VTI:          0.140 m LVOT/AV VTI ratio: 0.80  AORTA Ao Root diam: 3.20 cm MITRAL VALVE                TRICUSPID VALVE MV Area (PHT): 5.97 cm     TR Peak grad:   34.8 mmHg MV Decel Time: 127 msec     TR Vmax:        295.00 cm/s MV E velocity: 74.90 cm/s MV A velocity: 125.00 cm/s  SHUNTS MV E/A ratio:  0.60         Systemic VTI:  0.14 m                             Systemic Diam: 2.00 cm Lonni Hanson MD Electronically signed by Lonni Hanson MD Signature Date/Time: 06/11/2024/3:14:06 PM    Final    PERIPHERAL VASCULAR CATHETERIZATION Result Date: 06/11/2024 See surgical note for result.  US  Venous Img Lower Bilateral (DVT) Result Date: 06/11/2024 CLINICAL DATA:  Patient with pulmonary embolism.  Evaluate for DVT. EXAM: BILATERAL LOWER EXTREMITY VENOUS DOPPLER ULTRASOUND TECHNIQUE: Gray-scale sonography with graded compression, as well as color Doppler and duplex ultrasound were performed to evaluate the lower extremity deep venous systems from the level of the common femoral vein and including the common femoral, femoral, profunda femoral, popliteal and calf veins including the posterior tibial, peroneal and gastrocnemius veins when visible. The superficial great saphenous vein was also interrogated. Spectral Doppler was utilized to evaluate flow at rest and with distal augmentation maneuvers in the common femoral, femoral and popliteal veins. COMPARISON:  None Available. FINDINGS: RIGHT LOWER EXTREMITY Common Femoral Vein: No evidence of thrombus. Normal  compressibility, respiratory phasicity and response to augmentation. Saphenofemoral Junction: No evidence of thrombus. Normal compressibility and flow on color Doppler imaging. Profunda Femoral Vein: No evidence  of thrombus. Normal compressibility and flow on color Doppler imaging. Femoral Vein: Examination is positive for nonocclusive thrombus within the proximal right femoral vein. Popliteal Vein: No evidence of thrombus. Normal compressibility, respiratory phasicity and response to augmentation. Calf Veins: No evidence of thrombus. Normal compressibility and flow on color Doppler imaging. Superficial Great Saphenous Vein: No evidence of thrombus. Normal compressibility. Venous Reflux:  None. Other Findings:  None. LEFT LOWER EXTREMITY Common Femoral Vein: No evidence of thrombus. Normal compressibility, respiratory phasicity and response to augmentation. Saphenofemoral Junction: No evidence of thrombus. Normal compressibility and flow on color Doppler imaging. Profunda Femoral Vein: No evidence of thrombus. Normal compressibility and flow on color Doppler imaging. Femoral Vein: No evidence of thrombus. Normal compressibility, respiratory phasicity and response to augmentation. Popliteal Vein: No evidence of thrombus. Normal compressibility, respiratory phasicity and response to augmentation. Calf Veins: No evidence of thrombus. Normal compressibility and flow on color Doppler imaging. Superficial Great Saphenous Vein: No evidence of thrombus. Normal compressibility. Venous Reflux:  None. Other Findings:  None. IMPRESSION: 1. Examination is positive for nonocclusive thrombus within the proximal right femoral vein. 2. No evidence for left lower extremity deep venous thrombosis. Electronically Signed   By: Waddell Calk M.D.   On: 06/11/2024 06:39   CT Angio Chest PE W and/or Wo Contrast Result Date: 06/11/2024 CLINICAL DATA:  Shortness of breath and left-sided chest pain, known history of lung carcinoma EXAM:  CT ANGIOGRAPHY CHEST WITH CONTRAST TECHNIQUE: Multidetector CT imaging of the chest was performed using the standard protocol during bolus administration of intravenous contrast. Multiplanar CT image reconstructions and MIPs were obtained to evaluate the vascular anatomy. RADIATION DOSE REDUCTION: This exam was performed according to the departmental dose-optimization program which includes automated exposure control, adjustment of the mA and/or kV according to patient size and/or use of iterative reconstruction technique. CONTRAST:  75mL OMNIPAQUE  IOHEXOL  350 MG/ML SOLN COMPARISON:  05/24/2024 FINDINGS: Cardiovascular: Atherosclerotic calcifications of the thoracic aorta are noted. No aneurysmal dilatation or dissection is seen. The pulmonary artery shows a normal branching pattern bilaterally. Extensive pulmonary emboli are noted right greater than left. There are changes consistent with right heart strain with an RV/LV ratio of 1.5. Mediastinum/Nodes: Thoracic inlet is within normal limits. No hilar or mediastinal adenopathy is noted. The esophagus as visualized is within normal limits. Sliding-type hiatal hernia is seen. Lungs/Pleura: Small left effusion is noted. Scattered nodular densities are seen similar to that noted on prior exam consistent with metastatic disease. The largest index lesion in the left lower lobe measures approximately 17 mm in greatest dimension stable from the prior study. Similar findings are noted within the right lung although a large pleural effusion is noted increased when compared with the prior exam. Index lesion in the right lower lobe measures 17 mm also stable from the prior exam. Upper Abdomen: Visualized upper abdomen shows stable cyst in the left lobe of the liver. Musculoskeletal: Degenerative changes of the thoracic spine are noted. No compression deformity is noted. Review of the MIP images confirms the above findings. IMPRESSION: Extensive bilateral pulmonary emboli with  evidence of right heart strain. The RV/LV ratio measures 1.5. Scattered pulmonary nodules again identified consistent with metastatic disease. The overall appearance is similar to that seen on the recent exam. Aortic Atherosclerosis (ICD10-I70.0). Critical Value/emergent results were called by telephone at the time of interpretation on 06/11/2024 at 1:24 am to Dr. KEVIN PADUCHOWSKI , who verbally acknowledged these results. Electronically Signed   By: Oneil Evelyne HERO.D.  On: 06/11/2024 01:26   DG Chest Port 1 View Result Date: 06/10/2024 CLINICAL DATA:  Shortness of breath EXAM: PORTABLE CHEST 1 VIEW COMPARISON:  Chest x-ray 05/24/2024.  CT of the chest 05/24/2024. FINDINGS: Heart is enlarged. There are small bilateral pleural effusions. There scattered bilateral patchy airspace opacities in both lungs increasing in the left upper lobe. Scattered nodular densities are grossly unchanged. No pneumothorax or acute fracture identified. IMPRESSION: 1. Scattered bilateral patchy airspace opacities in both lungs increasing in the left upper lobe. Findings may represent edema or infection. 2. Small bilateral pleural effusions. 3. Stable scattered nodular densities. 4. Cardiomegaly. Electronically Signed   By: Greig Pique M.D.   On: 06/10/2024 23:14    ECHO as above.  06/11/2024  1. Left ventricular ejection fraction, by estimation, is 65 to 70%. The left ventricle has normal function. The left ventricle has no regional wall motion abnormalities. There is moderate left ventricular hypertrophy. Left ventricular diastolic parameters are indeterminate. There is the interventricular septum is flattened in diastole ('D' shaped left ventricle), consistent with right ventricular volume overload.   2. Pulmonary artery pressure is mildly elevated (RVSP 35 mmHg plus central venous/right atrial pressure). Right ventricular systolic function is moderately reduced. The right ventricular size is moderately enlarged.   3. The  mitral valve is degenerative. Trivial mitral valve regurgitation. No evidence of mitral stenosis.   4. The aortic valve is tricuspid. Aortic valve regurgitation is not visualized. No aortic stenosis is present.    TELEMETRY reviewed by me 07/04/2024: sinus rhythm, rate 70s  EKG reviewed by me: sinus tachycardia with LBBB, rate 109 bpm with no acute ischemic changes.  Data reviewed by me 07/04/2024: last 24h vitals tele labs imaging I/O hospitalist progress notes, pulmonology notes, palliative notes.  Principal Problem:   Sepsis due to pneumonia [facility]) Active Problems:   Essential hypertension   CKD stage 3a, GFR 45-59 ml/min (HCC)   Ischemic cardiomyopathy   Adenocarcinoma, lung, unspecified laterality (HCC)   Class 2 obesity   Dyslipidemia   Restless leg syndrome   CAD (coronary artery disease)   Dementia (HCC)   Pulmonary embolus (HCC)    ASSESSMENT AND PLAN:  Julie Mcbride is a 88 y.o. female  from [facility] rehab with a past medical history of hypertension, hyperlipidemia, hx NSTEMI (2022) treated medically, stage IV lung cancer with mets to liver and bones, recent saddle PE s/p thrombectomy (on Eliquis ), CKG stage 3a, renal artery stenosis, dementia, obesity who presented to the ED on 06/30/2024 for worsening SOB associated with productive cough and diminished appetite. Cardiology was consulted for further evaluation.   # Demand ischemia # Hypertension # Hyperlipidemia Patient without chest pain. Trops elevated and flat 71 > 190 > 630 > 760 > 720. EKG with sinus tachycardia with LBBB, rate 109 bpm with no acute ischemic changes. Has history of LBBB. Lipid panel from 02/2024 within normal limits with LDL at 51. Echo from prior admission with pEF (65-70%), no RWMA, mod LVH, interventricular flattened in diastole, mild PASP in setting of pulmonary embolism. Echo this admission with preserved EF, grade I diastolic dysfunction.  -Continue home ASA 81 mg and atorvastatin  10 mg daily.   -Continue home hydralazine  100 mg BID.  (Patient has documented allergy to ACEi/CCB).  -Elevated and flat troponin in setting of acute on chronic respiratory failure, sepsis due to PNA, severe lung cancer is most consistent with demand/supply mismatch and not ACS   # Recurrent Pleural Effusion s/p thoracentesis # Sepsis due  to multifocal pneumonia # Acute on chronic respiratory failure  # Stage IV lung cancer with mets to liver and bones # HX saddle PE s/p thrombectomy (05/2024) # CKD, stage 3a Patient with severe dyspnea.  CXR with bilateral pleural effusions, increased bilateral multifocal interstial opacities indicates pulmonary edema vs multifocal infection.  S/p duobnebs, solumedrol and IV lasix  60 mg x2 in ED. Patient on HHFNC. V?Q scan with no evidence of acute PE. Echo this admission large pleural effusion, no pericardial effusion, mod LVH, mod PASP, interventricular flattening in diastole. Patient underwent thoracentesis (08/06), removed 1.4L.  -On Eliquis  for hx pulmonary embolism -On Abx for PNA, management per primary team.  -Pulmonology consulted, appreciate recommendations.  -Recommend palliative consult.   Patient is critically ill with metastatic cancer and multiple comorbidities. Prognosis is guarded.   No further cardiac recommendations at this time. Cardiology will sign off. Please haiku with questions or re-engage if needed.   This patient's plan of care was discussed and created with Dr. Florencio and he is in agreement.  Signed: Dorene Comfort, PA-C  07/04/2024, 9:18 AM [facility]

## 2024-07-04 NOTE — Plan of Care (Signed)

## 2024-07-04 NOTE — Progress Notes (Signed)
 Speech Language Pathology Treatment: Dysphagia  Patient Details Name: Julie Mcbride MRN: [id number] DOB: 1936-01-25 Today's Date: 07/04/2024 Time: 8649-8584 SLP Time Calculation (min) (ACUTE ONLY): 25 min  Assessment / Plan / Recommendation Clinical Impression  Pt seen for ongoing assessment of toleration of mech soft diet, thin liquids; aspiration precautions w/ any oral intake in setting of pt's Significant Pulmonary decline/need for HHFNC O2 support- 30L; 35%. Lunch tray untouched on tray table during this visit.  Session targeted Education on general aspiration precautions; food consistency to best support conservation of energy. Son and pt denied any swallowing issues; no swallowing problems and she's much better today except to need to have a BM. NSG aware, addressing w/ pt.   Recommend a Mech Soft consistency diet w/ well-Cut meats, moistened foods -- a fairly regular diet to allow Foods of Pleasure but chopped down. Thin liquids -- CUP drinking only. No Straws. Support tray setup for pt to self-feed. Recommend aspiration precautions including SMALL SINGLE BITES/SIPs SLOWLY, rest breaks b/t bites/sips, REFLUX precautions. Pills CRUSHED in Puree for safer, easier swallowing. Assistance at meals for conservation of energy. REST BREAKS OFTEN. Education given on Pills in Puree; food consistencies and easier to eat options; general aspiration precautions to pt and Son. Pt is at increased risk for being unable to meet nutrition/hydration needs orally at this time d/t SEVERE Pulmonary and overall deconditioning.  MD/NSG to reconsult if any new needs arise. NSG updated, agreed. MD updated. Recommend Palliative Care and Dietician f/u for support.       HPI HPI: Pt is a 88 y.o. 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 PMH  includes:  osteoarthritis, diverticulitis, GERD, essential hypertension, Alzheimer's dementia and MI.  Last admit(05/2024), patient with acute hypoxic respiratory failure and right heart strain.  Underwent mechanical thrombectomy.  Patient discharged to rehab and was readmitted 06/30/2024 with multifocal pneumonia palliative care consulted to address goals.   CXR this admit: Moderate right and small to moderate left pleural effusions,  increased since the prior exam with associated bibasilar  atelectasis.  2. Increased bilateral multifocal interstitial opacities could  reflect pulmonary edema or multifocal infection.      SLP Plan  All goals met          Recommendations  Diet recommendations: Dysphagia 3 (mechanical soft);Thin liquid Liquids provided via: Cup (monitor closely IF any straw use) Medication Administration: Crushed with puree Supervision: Patient able to self feed;Staff to assist with self feeding;Full supervision/cueing for compensatory strategies Compensations: Minimize environmental distractions;Slow rate;Small sips/bites;Lingual sweep for clearance of pocketing;Multiple dry swallows after each bite/sip;Follow solids with liquid (Rest Breaks) Postural Changes and/or Swallow Maneuvers: Out of bed for meals;Seated upright 90 degrees;Upright 30-60 min after meal                 (Palliative Care; Dietician) Oral care BID;Patient independent with oral care (setup)   Frequent or constant Supervision/Assistance (d/t Pulmonary status; fatigue) Dysphagia, unspecified (R13.10) (significan Pulmonary decline, lung Ca.)     All goals met       Comer Portugal, MS, CCC-SLP Speech Language Pathologist Rehab Services; [facility] [place] [phone] (ascom) Rodel Glaspy  07/04/2024, 4:31 PM

## 2024-07-04 NOTE — Plan of Care (Signed)

## 2024-07-04 NOTE — Progress Notes (Signed)
 PULMONOLOGY         Date: 07/04/2024,   MRN# [id number] Julie Mcbride [birth year]    Referring provider: Dr Awanda   CHIEF COMPLAINT:   Acute on chronic hypoxemic respiratory failure   HISTORY OF PRESENT ILLNESS   This is a very nice 88 yo F who has never smoked and was recently diagnosed with lung cancer. She also has a background of obesity, skin cancer, anemia, allergies, mild dementia and recently has been dcd to rehab from [facility].  She reports that after initiation of rehab she was initially doing well followed by sudden respiratory distress and worsening hypoxemia. She had CT PE done earlier last month with no clots.    07/03/24- patient for thoracentesis today.  Julie Mcbride had VQ with notable bilateral large/moderate pleural effusions negative for PE.  She is working with palliative care and leaning towards hospice which I would support at this point. Family at bedside today.   07/04/24- patient is breathing better today after her thoracentesis.  She remains very weak unable to get OOB to chair.  She reports abdominal swelling distention and constipation. Son present at bedside states she is depressed and nervous about poor prognosis. We discussed home hospice and patient is agreeable to home hospice discharge.   PAST MEDICAL HISTORY   Past Medical History:  Diagnosis Date   Allergy    Anemia    Arthritis    Knees, hands   Basal cell carcinoma    back - removed   Bladder polyp    With previos hematuria   Diverticulitis    Dyspnea    Dyspnea on exertion    GERD (gastroesophageal reflux disease)    History of ETT    Myoview 7/10 EF 78%, no ischemia or infarction. Poor exercise tolerance (2'30, stopped due to fatigue). Reached 86% MPHR.   Hypertension    Kidney stones    Mixed Alzheimer's and vascular dementia (HCC)    Multiple allergies    Myocardial infarction (HCC)    Palpitations    Holter 7/10 with rare PACs   Postmenopausal    S/P hysterectomy    S/P  laminectomy    Squamous cell carcinoma in situ (SCCIS) of skin of left lower leg 07/20/2017     SURGICAL HISTORY   Past Surgical History:  Procedure Laterality Date   ABDOMINAL HYSTERECTOMY     ANKLE FRACTURE SURGERY Right 9/14   Dr. IVAR Mori   BACK SURGERY     BASAL CELL CARCINOMA EXCISION     back   BREAST BIOPSY Right 06/19/2017   Affirm Bx- Radial Scar   BREAST EXCISIONAL BIOPSY Right 1996   neg   BREAST LUMPECTOMY WITH NEEDLE LOCALIZATION Right 08/11/2017   Procedure: BREAST LUMPECTOMY WITH NEEDLE LOCALIZATION;  Surgeon: Claudene Larinda Bolder, MD;  Location: [facility];  Service: General;  Laterality: Right;   CARPAL TUNNEL RELEASE Right    CATARACT EXTRACTION W/ INTRAOCULAR LENS  IMPLANT, BILATERAL     EXCISION OF BREAST BIOPSY Right 08/11/2017   excision of radial Scar   EYE SURGERY     JOINT REPLACEMENT Right    knee   KNEE ARTHROSCOPY Right 04/22/2016   Procedure: ARTHROSCOPY KNEE;  Surgeon: Helayne Glenn, MD;  Location: [facility];  Service: Orthopedics;  Laterality: Right;   knee replacement Right 01/11/2017   NASAL SINUS SURGERY  06/05/14   Dr. Milissa   POSTERIOR CERVICAL LAMINECTOMY  2005   C5-7; plate and screws  PULMONARY THROMBECTOMY Bilateral 06/11/2024   Procedure: PULMONARY THROMBECTOMY;  Surgeon: Jama Cordella MATSU, MD;  Location: [facility];  Service: Cardiovascular;  Laterality: Bilateral;   RENAL ANGIOGRAPHY Left 04/20/2020   Procedure: RENAL ANGIOGRAPHY;  Surgeon: Marea Selinda RAMAN, MD;  Location: [facility];  Service: Cardiovascular;  Laterality: Left;   TONSILLECTOMY     VIDEO BRONCHOSCOPY WITH ENDOBRONCHIAL NAVIGATION N/A 05/24/2024   Procedure: VIDEO BRONCHOSCOPY WITH ENDOBRONCHIAL NAVIGATION;  Surgeon: Parris Manna, MD;  Location: [facility];  Service: Thoracic;  Laterality: N/A;   VIDEO BRONCHOSCOPY WITH ENDOBRONCHIAL ULTRASOUND N/A 05/24/2024   Procedure: BRONCHOSCOPY, WITH EBUS;  Surgeon: Parris Manna, MD;  Location: [facility];  Service: Thoracic;  Laterality: N/A;     FAMILY HISTORY   Family History  Problem Relation Age of Onset   Ovarian cancer Mother    Cancer Mother    Stroke Father        CVA   Breast cancer Daughter 46   Breast cancer Maternal Aunt 60   Breast cancer Maternal Aunt 90   Prostate cancer Neg Hx    Bladder Cancer Neg Hx    Kidney cancer Neg Hx      SOCIAL HISTORY   Social History   Tobacco Use   Smoking status: Never   Smokeless tobacco: Never  Vaping Use   Vaping status: Never Used  Substance Use Topics   Alcohol use: No   Drug use: No     MEDICATIONS    Current Medication:  Current Facility-Administered Medications:    acetaminophen  (TYLENOL ) tablet 1,000 mg, 1,000 mg, Oral, Q8H PRN, Fausto Sor A, DO, 1,000 mg at 07/02/24 2336   albuterol  (PROVENTIL ) (2.5 MG/3ML) 0.083% nebulizer solution 3 mL, 3 mL, Inhalation, Q6H PRN, Fausto Sor A, DO, 3 mL at 07/02/24 1633   ALPRAZolam  (XANAX ) tablet 0.25 mg, 0.25 mg, Oral, TID PRN, Dezii, Alexandra, DO   apixaban  (ELIQUIS ) tablet 5 mg, 5 mg, Oral, BID, Fausto Sor A, DO, 5 mg at 07/04/24 0925   aspirin  EC tablet 81 mg, 81 mg, Oral, Daily, Fausto Sor A, DO, 81 mg at 07/04/24 9075   atorvastatin  (LIPITOR) tablet 10 mg, 10 mg, Oral, Daily, Fausto Sor A, DO, 10 mg at 07/03/24 2145   azithromycin  (ZITHROMAX ) 500 mg in sodium chloride  0.9 % 250 mL IVPB, 500 mg, Intravenous, Q24H, Fausto Sor A, DO, Last Rate: 250 mL/hr at 07/03/24 2259, 500 mg at 07/03/24 2259   bisoprolol  (ZEBETA ) tablet 10 mg, 10 mg, Oral, Daily, Fausto Sor A, DO, 10 mg at 07/04/24 9075   cefTRIAXone  (ROCEPHIN ) 2 g in sodium chloride  0.9 % 100 mL IVPB, 2 g, Intravenous, Q24H, Fausto Sor A, DO, Last Rate: 200 mL/hr at 07/03/24 2157, 2 g at 07/03/24 2157   dextromethorphan -guaiFENesin  (MUCINEX  DM) 30-600 MG per 12 hr tablet 1 tablet, 1 tablet, Oral, BID, Fausto Sor A, DO, 1 tablet at 07/04/24 9073   fluticasone  (FLONASE )  50 MCG/ACT nasal spray 2 spray, 2 spray, Each Nare, PRN, Fausto Sor A, DO   guaiFENesin  (ROBITUSSIN) 100 MG/5ML liquid 5 mL, 5 mL, Oral, Q4H PRN, Fausto Sor A, DO, 5 mL at 07/04/24 9074   hydrALAZINE  (APRESOLINE ) tablet 100 mg, 100 mg, Oral, BID, Fausto Sor A, DO, 100 mg at 07/04/24 0925   lactulose  (CHRONULAC ) 10 GM/15ML solution 20 g, 20 g, Oral, TID, Dezii, Alexandra, DO   lidocaine  (LIDODERM ) 5 % 1 patch, 1 patch, Transdermal, Q24H, Lenon Elsie HERO, RPH, 1 patch at 07/03/24  0900   loratadine  (CLARITIN ) tablet 10 mg, 10 mg, Oral, Daily, Fausto Sor A, DO, 10 mg at 07/03/24 2145   methocarbamol  (ROBAXIN ) tablet 500 mg, 500 mg, Oral, Q6H PRN, Fausto Sor A, DO, 500 mg at 07/03/24 9668   methylPREDNISolone  sodium succinate (SOLU-MEDROL ) 40 mg/mL injection 40 mg, 40 mg, Intravenous, BID, Awanda [place], MD, 40 mg at 07/04/24 9072   morphine  CONCENTRATE 10 mg / 0.5 ml oral solution 10 mg, 10 mg, Oral, Q4H PRN, Awanda [place], MD, 10 mg at 07/02/24 9160   oxyCODONE -acetaminophen  (PERCOCET/ROXICET) 5-325 MG per tablet 1 tablet, 1 tablet, Oral, Q4H PRN, Awanda [place], MD, 1 tablet at 07/03/24 1035   polyethylene glycol (MIRALAX  / GLYCOLAX ) packet 34 g, 34 g, Oral, BID, Awanda [place], MD, 34 g at 07/04/24 9073   rOPINIRole  (REQUIP ) tablet 0.5 mg, 0.5 mg, Oral, TID, Fausto Sor A, DO, 0.5 mg at 07/04/24 9074    ALLERGIES   Chlorhexidine , Cyclobenzaprine hcl, Etodolac, Lisinopril, Meloxicam, Metaxalone, Neomycin-bacitracin zn-polymyx, Nifedipine, Nitrofurantoin, Sulfamethoxazole-trimethoprim, Sulfonamide derivatives, Tizanidine, Vasotec [enalapril maleate], Ibuprofen, and Tape     REVIEW OF SYSTEMS    Review of Systems:  Gen:  Denies  fever, sweats, chills weigh loss  HEENT: Denies blurred vision, double vision, ear pain, eye pain, hearing loss, nose bleeds, sore throat Cardiac:  No dizziness, chest pain or heaviness, chest tightness,edema Resp:   reports dyspnea chronically  Gi:  Denies swallowing difficulty, stomach pain, nausea or vomiting, diarrhea, constipation, bowel incontinence Gu:  Denies bladder incontinence, burning urine Ext:   Denies Joint pain, stiffness or swelling Skin: Denies  skin rash, easy bruising or bleeding or hives Endoc:  Denies polyuria, polydipsia , polyphagia or weight change Psych:   Denies depression, insomnia or hallucinations   Other:  All other systems negative   VS: BP (!) 147/63 (BP Location: Left Arm)   Pulse 75   Temp 98 F (36.7 C)   Resp 18   Ht 5' 6 (1.676 m)   SpO2 95%   BMI 34.87 kg/m      PHYSICAL EXAM    GENERAL:NAD, no fevers, chills, no weakness no fatigue HEAD: Normocephalic, atraumatic.  EYES: Pupils equal, round, reactive to light. Extraocular muscles intact. No scleral icterus.  MOUTH: Moist mucosal membrane. Dentition intact. No abscess noted.  EAR, NOSE, THROAT: Clear without exudates. No external lesions.  NECK: Supple. No thyromegaly. No nodules. No JVD.  PULMONARY: decreased breath sounds with mild rhonchi worse at bases bilaterally.  CARDIOVASCULAR: S1 and S2. Regular rate and rhythm. No murmurs, rubs, or gallops. No edema. Pedal pulses 2+ bilaterally.  GASTROINTESTINAL: Soft, nontender, nondistended. No masses. Positive bowel sounds. No hepatosplenomegaly.  MUSCULOSKELETAL: No swelling, clubbing, or edema. Range of motion full in all extremities.  NEUROLOGIC: Cranial nerves II through XII are intact. No gross focal neurological deficits. Sensation intact. Reflexes intact.  SKIN: No ulceration, lesions, rashes, or cyanosis. Skin warm and dry. Turgor intact.  PSYCHIATRIC: Mood, affect within normal limits. The patient is awake, alert and oriented x 3. Insight, judgment intact.       IMAGING     ASSESSMENT/PLAN   Acute on chronic hypoxemic respiratory failure             Patient is with metastatic cancer and recurrent pleural effusions bilaterally with compressive atelectasis.           -  VQ is negative for PE          - s/p Thoracentesis , fluid studies inprocess          -  she is currently treated with antimicrobial therapy for CAP with zithromax  and rocephin          - respiratory cultures are in process    Metastatic cancer     - s/p oncology evaluation     - patient with poor prognosis palliative care is working with her and she may transition to hospice       Thank you for allowing me to participate in the care of this patient.   Patient/Family are satisfied with care plan and all questions have been answered.    Provider disclosure: Patient with at least one acute or chronic illness or injury that poses a threat to life or bodily function and is being managed actively during this encounter.  All of the below services have been performed independently by signing provider:  review of prior documentation from internal and or external health records.  Review of previous and current lab results.  Interview and comprehensive assessment during patient visit today. Review of current and previous chest radiographs/CT scans. Discussion of management and test interpretation with health care team and patient/family.   This document was prepared using Dragon voice recognition software and may include unintentional dictation errors.     Ailed Defibaugh, M.D.  Division of Pulmonary & Critical Care Medicine

## 2024-07-04 NOTE — Progress Notes (Signed)
 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  Patient O2 was found to be 85% at SNF.  Patient was recently discharged 06/19/2024 for pulmonary embolism.  She reports she was getting stronger at rehab plan was to discharge home next weekend.  Subjective: On initial evaluation the patient is sleeping easily in bed.  She reports she did not have a restful night due to interruptions from the medical team.  She does report that she feels much better since thoracentesis yesterday.  She is still on high flow nasal cannula but reports improvement in respirations.  She has requested for Ativan  to be switched to home Xanax  instead.  She is also endorsing ongoing difficulty with constipation and is amendable to enema.  Son, Julie Mcbride, is at bedside.  We have discussed goals and patient's desires.  At this time Julie Mcbride seems hopeful that the patient can regain some strength.   Julie Mcbride had a separate conversation with our pulmonologist later on in the day and agreed to hospice.  When confirming hospice decisions with the patient's daughter, Julie Mcbride, she became tearful and informed me that the family was not on the same page and she is not consenting to hospice care at this time.  She remains hopeful that the patient can gain strength and receive Keytruda therapy outpatient with oncology.  She would like more time to discuss their goals and discuss again with us  tomorrow after family  meeting.  Objective: Vitals:   07/03/24 1947 07/04/24 0258 07/04/24 0333 07/04/24 0736  BP:   (!) 133/58 (!) 147/63  Pulse:   73 75  Resp:   18   Temp:   97.7 F (36.5 C) 98 F (36.7 C)  TempSrc:      SpO2: 97% 96% 97% 95%  Height:        Intake/Output Summary (Last 24 hours) at 07/04/2024 1438 Last data filed at 07/03/2024 2259 Gross per 24 hour  Intake 350 ml  Output 400 ml  Net -50 ml   There were no vitals filed for this visit.  Examination: General exam: Appears fatigued Respiratory system: No work of breathing, symmetric chest wall expansion, high flow nasal cannula in place Cardiovascular system: S1 & S2 heard, RRR.  Gastrointestinal system: Abdomen is nondistended, soft and nontender.  Neuro: Drowsy but arousable.  Oriented Extremities: Symmetric, expected ROM Skin: No rashes, lesions Psychiatry: Easily tearful  Assessment & Plan:  Principal Problem:   Sepsis due to pneumonia [facility]) Active Problems:   Dyslipidemia   Essential hypertension   Ischemic cardiomyopathy   CKD stage 3a, GFR 45-59 ml/min (HCC)   Adenocarcinoma, lung, unspecified laterality (HCC)   Class 2 obesity   Restless leg syndrome   CAD (coronary artery disease)   Dementia (HCC)   Pulmonary embolus (HCC)    Acute on chronic respiratory failure with hypoxia and hypercapnia - On baseline 2 L O2 - Current respiratory failure is multifactorial secondary to lung cancer, pleural effusions, pulmonary edema, multifocal infection - Continues to request high flow nasal cannula  for comfort, we will wean this as able - Continuing treatment for pneumonia and pulmonary edema though minimal improvement - 8/6 thoracentesis: 1.4 L clear amber fluid aspirated.  She is doing better. - Continue with steroid therapy - Pulmonology and cardiology are consulted - As needed morphine  for air hunger and Xanax  for anxiety.  - Respiratory viral panel negative  Sepsis secondary to multifocal pneumonia - On arrival  tachycardic, tachypneic, leukocytosis - Continue ceftriaxone  azithromycin   Elevated troponin - Likely demand ischemia due to the above.  Troponin peaked around 700s. - Cardiology consulted, unlikely ACS  Bilateral pleural effusions - Mild improvement with IV Lasix .  Will discontinue Lasix  now - Status post thoracentesis on 8/6  Recent saddle pulmonary embolism status post thrombectomy Right femoral DVT - Continue Eliquis  - VQ scan negative for acute PE  Stage IV B adenocarcinoma, lung cancer with metastatic spread to liver and bones - Oncology Dr. Jacobo and palliative care consulted.  Patient does not want to pursue chemotherapy.  Oncology has offered her Keytruda.  Patient's family is hopeful that she can receive this therapy due to high success rates and minimal side effects  CKD stage IIIa - Stable near baseline  Constipation - Active bowel sounds.  Nontender abdomen. - Fleets enema and lactulose  ordered  Renal artery stenosis status post stent - Continue to monitor kidney function - Continue aspirin   Hypertension - Continue current meds, titrate as needed  Restless leg syndrome - Continue home dose Requip   Generalized weakness Debility Poor prognosis - Patient's prognosis is overall poor.  Decisions are being made by patient and her 3 adult children.  At this time her children are not in complete agreement with goals.  Have encouraged the patient's children to keep the patient's desires at the center of their decision making.  To me personally the patient continues to endorse a desire to go home and let God take her. Some of the patient's children are considering hospice while others remain hopeful that the patient could gain strength and achieve enough functional status to receive Keytruda therapy.  Palliative care has been consulted and is assisting in these conversations.  Pts family is meeting this evening and we will discuss GOC again tomorrow.  DVT  prophylaxis: Eliquis    Code Status: Limited: Do not attempt resuscitation (DNR) -DNR-LIMITED -Do Not Intubate/DNI  Disposition:  Pending final decisions on dispo.  Patient and family considering hospice.  Consultants:  Treatment Team:  Consulting Physician: Parris Manna, MD  Procedures:    Antimicrobials:  Anti-infectives (From admission, onward)    Start     Dose/Rate Route Frequency Ordered Stop   07/01/24 2200  cefTRIAXone  (ROCEPHIN ) 2 g in sodium chloride  0.9 % 100 mL IVPB        2 g 200 mL/hr over 30 Minutes Intravenous Every 24 hours 06/30/24 2046 07/06/24 2159   07/01/24 2200  azithromycin  (ZITHROMAX ) 500 mg in sodium chloride  0.9 % 250 mL IVPB        500 mg 250 mL/hr over 60 Minutes Intravenous Every 24 hours 06/30/24 2046 07/06/24 2159   06/30/24 2030  azithromycin  (ZITHROMAX ) 500 mg in sodium chloride  0.9 % 250 mL IVPB        500 mg 250 mL/hr over 60 Minutes Intravenous  Once 06/30/24 2024 06/30/24 2205   06/30/24 2030  cefTRIAXone  (ROCEPHIN ) 2 g in sodium chloride  0.9 % 100 mL IVPB        2 g 200 mL/hr over 30 Minutes Intravenous  Once 06/30/24  2024 06/30/24 2102       Data Reviewed: I have personally reviewed following labs and imaging studies CBC: Recent Labs  Lab 06/30/24 1849 07/01/24 0348 07/02/24 0458 07/03/24 0531  WBC 19.3* 10.1 17.0* 16.1*  NEUTROABS 12.7*  --   --   --   HGB 10.7* 9.5* 9.6* 9.5*  HCT 34.1* 29.5* 30.8* 29.6*  MCV 95.0 92.5 93.3 92.2  PLT 309 184 207 187   Basic Metabolic Panel: Recent Labs  Lab 06/30/24 1849 07/01/24 0348 07/02/24 0458 07/04/24 0434  NA 137 141 142 144  K 4.1 3.9 4.0 4.2  CL 98 101 105 103  CO2 26 29 27 28   GLUCOSE 268* 159* 165* 160*  BUN 28* 32* 46* 63*  CREATININE 1.39* 1.44* 1.49* 1.41*  CALCIUM  9.1 9.2 9.0 9.0  MG  --  2.2 2.1  --    GFR: CrCl cannot be calculated (Unknown ideal weight.). Liver Function Tests: Recent Labs  Lab 06/30/24 1849  AST 33  ALT 20  ALKPHOS 144*  BILITOT 0.7   PROT 6.1*  ALBUMIN  2.9*   CBG: Recent Labs  Lab 07/03/24 2053  GLUCAP 219*    Recent Results (from the past 240 hours)  Culture, blood (routine x 2)     Status: None (Preliminary result)   Collection Time: 06/30/24  6:50 PM   Specimen: BLOOD  Result Value Ref Range Status   Specimen Description BLOOD RIGHT ANTECUBITAL  Final   Special Requests   Final    BOTTLES DRAWN AEROBIC AND ANAEROBIC Blood Culture adequate volume   Culture   Final    NO GROWTH 4 DAYS Performed at [facility], [address] [place], [place] [address]    Report Status PENDING  Incomplete  Resp panel by RT-PCR (RSV, Flu A&B, Covid) Anterior Nasal Swab     Status: None   Collection Time: 06/30/24  6:55 PM   Specimen: Anterior Nasal Swab  Result Value Ref Range Status   SARS Coronavirus 2 by RT PCR NEGATIVE NEGATIVE Final    Comment: (NOTE) SARS-CoV-2 target nucleic acids are NOT DETECTED.  The SARS-CoV-2 RNA is generally detectable in upper respiratory specimens during the acute phase of infection. The lowest concentration of SARS-CoV-2 viral copies this assay can detect is 138 copies/mL. A negative result does not preclude SARS-Cov-2 infection and should not be used as the sole basis for treatment or other patient management decisions. A negative result may occur with  improper specimen collection/handling, submission of specimen other than nasopharyngeal swab, presence of viral mutation(s) within the areas targeted by this assay, and inadequate number of viral copies(<138 copies/mL). A negative result must be combined with clinical observations, patient history, and epidemiological information. The expected result is Negative.  Fact Sheet for Patients:  BloggerCourse.com  Fact Sheet for Healthcare Providers:  SeriousBroker.it  This test is no t yet approved or cleared by the [place]  FDA and  has been authorized for detection  and/or diagnosis of SARS-CoV-2 by FDA under an Emergency Use Authorization (EUA). This EUA will remain  in effect (meaning this test can be used) for the duration of the COVID-19 declaration under Section 564(b)(1) of the Act, 21 U.S.C.section 360bbb-3(b)(1), unless the authorization is terminated  or revoked sooner.       Influenza A by PCR NEGATIVE NEGATIVE Final   Influenza B by PCR NEGATIVE NEGATIVE Final    Comment: (NOTE) The Xpert Xpress SARS-CoV-2/FLU/RSV plus assay is intended as an aid in  the diagnosis of influenza from Nasopharyngeal swab specimens and should not be used as a sole basis for treatment. Nasal washings and aspirates are unacceptable for Xpert Xpress SARS-CoV-2/FLU/RSV testing.  Fact Sheet for Patients: BloggerCourse.com  Fact Sheet for Healthcare Providers: SeriousBroker.it  This test is not yet approved or cleared by the [place]  FDA and has been authorized for detection and/or diagnosis of SARS-CoV-2 by FDA under an Emergency Use Authorization (EUA). This EUA will remain in effect (meaning this test can be used) for the duration of the COVID-19 declaration under Section 564(b)(1) of the Act, 21 U.S.C. section 360bbb-3(b)(1), unless the authorization is terminated or revoked.     Resp Syncytial Virus by PCR NEGATIVE NEGATIVE Final    Comment: (NOTE) Fact Sheet for Patients: BloggerCourse.com  Fact Sheet for Healthcare Providers: SeriousBroker.it  This test is not yet approved or cleared by the [place]  FDA and has been authorized for detection and/or diagnosis of SARS-CoV-2 by FDA under an Emergency Use Authorization (EUA). This EUA will remain in effect (meaning this test can be used) for the duration of the COVID-19 declaration under Section 564(b)(1) of the Act, 21 U.S.C. section 360bbb-3(b)(1), unless the authorization is terminated  or revoked.  Performed at [facility], [address] [place], [place] [address]   Culture, blood (routine x 2)     Status: None (Preliminary result)   Collection Time: 06/30/24  6:55 PM   Specimen: BLOOD LEFT HAND  Result Value Ref Range Status   Specimen Description BLOOD LEFT HAND  Final   Special Requests   Final    BOTTLES DRAWN AEROBIC AND ANAEROBIC Blood Culture adequate volume   Culture   Final    NO GROWTH 4 DAYS Performed at [facility], [address] [place], [place] [address]    Report Status PENDING  Incomplete  Respiratory (~20 pathogens) panel by PCR     Status: None   Collection Time: 06/30/24  6:55 PM   Specimen: Nasopharyngeal Swab; Respiratory  Result Value Ref Range Status   Adenovirus NOT DETECTED NOT DETECTED Final   Coronavirus 229E NOT DETECTED NOT DETECTED Final    Comment: (NOTE) The Coronavirus on the Respiratory Panel, DOES NOT test for the novel  Coronavirus (2019 nCoV)    Coronavirus HKU1 NOT DETECTED NOT DETECTED Final   Coronavirus NL63 NOT DETECTED NOT DETECTED Final   Coronavirus OC43 NOT DETECTED NOT DETECTED Final   Metapneumovirus NOT DETECTED NOT DETECTED Final   Rhinovirus / Enterovirus NOT DETECTED NOT DETECTED Final   Influenza A NOT DETECTED NOT DETECTED Final   Influenza B NOT DETECTED NOT DETECTED Final   Parainfluenza Virus 1 NOT DETECTED NOT DETECTED Final   Parainfluenza Virus 2 NOT DETECTED NOT DETECTED Final   Parainfluenza Virus 3 NOT DETECTED NOT DETECTED Final   Parainfluenza Virus 4 NOT DETECTED NOT DETECTED Final   Respiratory Syncytial Virus NOT DETECTED NOT DETECTED Final   Bordetella pertussis NOT DETECTED NOT DETECTED Final   Bordetella Parapertussis NOT DETECTED NOT DETECTED Final   Chlamydophila pneumoniae NOT DETECTED NOT DETECTED Final   Mycoplasma pneumoniae NOT DETECTED NOT DETECTED Final    Comment: Performed at [facility], [address], [place], [place] [address]  MRSA  Next Gen by PCR, Nasal     Status: None   Collection Time: 07/01/24  3:48 AM   Specimen: Nasal Mucosa; Nasal Swab  Result Value Ref Range Status   MRSA by PCR Next Gen NOT DETECTED  NOT DETECTED Final    Comment: (NOTE) The GeneXpert MRSA Assay (FDA approved for NASAL specimens only), is one component of a comprehensive MRSA colonization surveillance program. It is not intended to diagnose MRSA infection nor to guide or monitor treatment for MRSA infections. Test performance is not FDA approved in patients less than 32 years old. Performed at [facility], [address], [place], [place] [address]      Radiology Studies: [facility] Chest [place] 1 View Result Date: 07/03/2024 CLINICAL DATA:  Status post thoracentesis. EXAM: PORTABLE CHEST 1 VIEW COMPARISON:  07/02/2024 and CT chest 06/11/2024. FINDINGS: Patient's head obscures the superior mediastinum and medial apices. Heart is enlarged, stable. Bibasilar collapse/consolidation with bilateral pleural effusions, minimally decreased on the right, status post thoracentesis. No definite pneumothorax. IMPRESSION: 1. No definite pneumothorax status post right thoracentesis. 2. Bibasilar collapse/consolidation with moderate bilateral pleural effusions, minimally decreased on the right. 3. Pulmonary metastatic disease better seen on CT chest 06/11/2024. Electronically Signed   By: Newell Eke M.D.   On: 07/03/2024 14:01   US  THORACENTESIS ASP PLEURAL SPACE W/IMG GUIDE Result Date: 07/03/2024 INDICATION: 88 year old female with history of stage IVb lung cancer, with new onset right-sided pleural effusion. IR was requested for diagnostic and therapeutic thoracentesis. EXAM: ULTRASOUND GUIDED DIAGNOSTIC AND THERAPEUTIC THORACENTESIS MEDICATIONS: 6 cc of 1% lidocaine  COMPLICATIONS: None immediate. PROCEDURE: An ultrasound guided thoracentesis was thoroughly discussed with the patient and questions answered. The benefits, risks, alternatives and  complications were also discussed. The patient understands and wishes to proceed with the procedure. Written consent was obtained. Ultrasound was performed to localize and mark an adequate pocket of fluid in the right chest. The area was then prepped and draped in the normal sterile fashion. 1% Lidocaine  was used for local anesthesia. Under ultrasound guidance a 6 Fr Safe-T-Centesis catheter was introduced. Thoracentesis was performed. The catheter was removed and a dressing applied. FINDINGS: A total of approximately 1.4 L of clear, dark amber versus blood-tinged pleural fluid was removed. Samples were sent to the laboratory as requested by the clinical team. IMPRESSION: Successful ultrasound guided right thoracentesis yielding 1.4 L of pleural fluid. Procedure performed by Carlin Griffon, PA-C Electronically Signed   By: CHRISTELLA.  Shick M.D.   On: 07/03/2024 13:06   NM Pulmonary Perfusion Result Date: 07/02/2024 CLINICAL DATA:  Pulmonary embolism, high probability EXAM: NUCLEAR MEDICINE PERFUSION LUNG SCAN TECHNIQUE: Perfusion images were obtained in multiple projections after intravenous injection of radiopharmaceutical. Ventilation scans intentionally deferred if perfusion scan and chest x-ray adequate for interpretation during COVID 19 epidemic. RADIOPHARMACEUTICALS:  4.33 mCi Tc-4m MAA IV COMPARISON:  None. Findings are correlated with prior chest radiograph of 07/02/2024 FINDINGS: Moderate right and small left pleural effusions are noted on prior chest radiograph. Defects are noted involving the posterior basal aspects of the lungs bilaterally related to these pleural effusions. Linear defect within the lungs bilaterally likely reflect fluid within the fissures. Small subsegmental defect within the right apex is nonspecific. Altogether, the findings do not support the presence of underlying acute pulmonary embolus. IMPRESSION: No pulmonary embolism. Multiple defects related to bilateral pleural effusions, right  greater than left. Electronically Signed   By: Dorethia Molt M.D.   On: 07/02/2024 16:09    Scheduled Meds:  apixaban   5 mg Oral BID   aspirin  EC  81 mg Oral Daily   atorvastatin   10 mg Oral Daily   bisoprolol   10 mg Oral Daily   dextromethorphan -guaiFENesin   1 tablet Oral BID   hydrALAZINE   100 mg Oral BID   lactulose   20 g Oral TID   lidocaine   1 patch Transdermal Q24H   loratadine   10 mg Oral Daily   methylPREDNISolone  (SOLU-MEDROL ) injection  40 mg Intravenous BID   polyethylene glycol  34 g Oral BID   rOPINIRole   0.5 mg Oral TID   Continuous Infusions:  azithromycin  500 mg (07/03/24 2259)   cefTRIAXone  (ROCEPHIN )  IV 2 g (07/03/24 2157)     LOS: 4 days  MDM: Patient is high risk for one or more organ failure.  They necessitate ongoing hospitalization for continued IV therapies and subsequent lab monitoring. Total time spent interpreting labs and vitals, reviewing the medical record, coordinating care amongst consultants and care team members, directly assessing and discussing care with the patient and/or family: 55 min  Anjali Manzella, DO [facility]  To contact the attending physician between 7A-7P please use Epic Chat. To contact the covering physician during after hours 7P-7A, please review Amion.  07/04/2024, 2:38 PM   *This document has been created with the assistance of dictation software. Please excuse typographical errors. *

## 2024-07-04 NOTE — Progress Notes (Signed)
 PT Cancellation Note  Patient Details Name: Julie Mcbride MRN: [id number] DOB: 1936/08/13   Cancelled Treatment:    Reason Eval/Treat Not Completed: Other (comment) per son and RN, patient has just been given enema. Will re-attempt tomorrow as time allows.    Amoree Newlon 07/04/2024, 2:45 PM

## 2024-07-04 NOTE — TOC CM/SW Note (Signed)
 CSW is following for treatment/disposition plan.  Lauraine Carpen, CSW [phone]

## 2024-07-05 DIAGNOSIS — J9601 Acute respiratory failure with hypoxia: Secondary | ICD-10-CM

## 2024-07-05 DIAGNOSIS — J90 Pleural effusion, not elsewhere classified: Secondary | ICD-10-CM | POA: Diagnosis not present

## 2024-07-05 DIAGNOSIS — A419 Sepsis, unspecified organism: Secondary | ICD-10-CM | POA: Diagnosis not present

## 2024-07-05 DIAGNOSIS — Z515 Encounter for palliative care: Secondary | ICD-10-CM | POA: Diagnosis not present

## 2024-07-05 DIAGNOSIS — J189 Pneumonia, unspecified organism: Secondary | ICD-10-CM | POA: Diagnosis not present

## 2024-07-05 DIAGNOSIS — Z9889 Other specified postprocedural states: Secondary | ICD-10-CM | POA: Diagnosis not present

## 2024-07-05 LAB — CULTURE, BLOOD (ROUTINE X 2)
Culture: NO GROWTH
Culture: NO GROWTH
Special Requests: ADEQUATE
Special Requests: ADEQUATE

## 2024-07-05 LAB — CBC
HCT: 27.9 % — ABNORMAL LOW (ref 36.0–46.0)
Hemoglobin: 8.6 g/dL — ABNORMAL LOW (ref 12.0–15.0)
MCH: 29.2 pg (ref 26.0–34.0)
MCHC: 30.8 g/dL (ref 30.0–36.0)
MCV: 94.6 fL (ref 80.0–100.0)
Platelets: 158 K/uL (ref 150–400)
RBC: 2.95 MIL/uL — ABNORMAL LOW (ref 3.87–5.11)
RDW: 14.6 % (ref 11.5–15.5)
WBC: 9.9 K/uL (ref 4.0–10.5)
nRBC: 0 % (ref 0.0–0.2)

## 2024-07-05 MED ORDER — PREDNISONE 50 MG PO TABS
50.0000 mg | ORAL_TABLET | Freq: Every day | ORAL | Status: AC
  Administered 2024-07-06 – 2024-07-07 (×2): 50 mg via ORAL
  Filled 2024-07-05 (×3): qty 1

## 2024-07-05 NOTE — Plan of Care (Signed)

## 2024-07-05 NOTE — Progress Notes (Signed)
 Presented back to room for family meeting.  Present in the room was patient's husband, Korbyn Vanes, and her 3 adult children. We had extensive discussion regarding patient's overall clinical picture.  We discussed her widespread stage IVb adenocarcinoma and discussed sites of metastasis.  We discussed Keytruda therapy as a palliative option.  We discussed patient's poor functional status and determined difficulty meeting her functional goals.  We discussed the last 30 days leading up to this hospitalization as well as the complexities of her medical care including pneumonia, respiratory failure, and pleural effusion requiring thoracentesis. Patient's family had multiple questions including consideration of alternative therapies including ivermectin and turkeytail.  I discussed with the family that at this time we can recommend evidence-based medicine only but advised them to please inform all members of the medical team if they decide to pursue alternative therapies so that we can be aware of possible medication interactions and side effects. The patient reiterated her desires for full transparency and honesty from the medical team regarding her prognosis.  We discussed that ultimately and unfortunately her prognosis is poor.  She was grateful for the honesty and reported she did not have a clear picture of her health status until this conversation.   The patient is very clear that she does not want to continue to return to the hospital for medical intervention.  She does desire to go home.  She reports that she was not aware until this conversation that hospice would best support these goals.   The patient's children would like more time to consider the conversation that we had today.  They are not ready to pursue hospice care as of yet.  They like to continue with this hospitalization and all aggressive therapies including continuing to wean off of oxygen and work on the patient's strength with therapy  teams. We discussed skilled nursing facility as a transition to home and the patient is adamant that she does not want to return to another facility.  I shared with her my concerns about returning home at her current level of functioning as she is not able to perform ADLs independently at this time and does require 24/7 care.  The patient's children would be willing to consider a skilled nursing facility but are not amendable to returning to her prior living facility.  They do endorse it may be difficult to provide 24/7 care at home, as the patient currently lives alone with her elderly husband. I reassured the patient and her family that we can continue to have ongoing discussions.  The family would like some time to talk amongst themselves and we will revisit this discussion tomorrow.  I answered all questions and concerns to the best of my ability.      Coriana Angello, DO  Patient is high risk for one or more organ failure.  Total time spent interpreting labs and vitals, coordinating care amongst consultants and care team members, directly assessing and discussing care with the patient and/or family >55 min

## 2024-07-05 NOTE — Progress Notes (Addendum)
 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 Pt reports she wants to get out of bed. Pt is making progress towards goals, requires CGA for bed mobitly, STS first two attempts is unable to clear buttocks, third attempt performs STS with MIN A and multi modal cues for optimal body mechanics. Step pivot to bedside chair with CGA with RW. Supervision required for feeding tasks. Spo2 >90% on 35L via HHFNC with no SOB noted during mobility attempts. PT does report light headedness after transfer to chair, BP 140/61 Hr 61 bpm. Pt is making progress towards goals, OT will continue to follow.       If plan is discharge home, recommend the following:  A lot of help with walking and/or transfers;A lot of help with bathing/dressing/bathroom;Help with stairs or ramp for entrance;Assistance with cooking/housework;Direct supervision/assist for medications management;Direct supervision/assist for financial management;Assist for transportation   Equipment Recommendations  Wheelchair (measurements OT);Hospital bed    Recommendations for Other Services      Precautions / Restrictions Precautions Precautions: Fall Recall of Precautions/Restrictions: Impaired Restrictions Weight Bearing Restrictions Per Provider Order: No       Mobility Bed Mobility Overal bed mobility: Needs  Assistance Bed Mobility: Sit to Supine, Supine to Sit     Supine to sit: HOB elevated, Used rails, Contact guard          Transfers Overall transfer level: Needs assistance Equipment used: Rolling walker (2 wheels) Transfers: Sit to/from Stand Sit to Stand: Min assist (first two attempts unable to clear buttocks, then with frequent multi modal cues and improved body mechanics, pt was able to perform STS with MIN A)                 Balance Overall balance assessment: Needs assistance Sitting-balance support: Feet supported, No upper extremity supported Sitting balance-Leahy Scale: Good     Standing balance support: During functional activity, Bilateral upper extremity supported, Reliant on assistive device for balance Standing balance-Leahy Scale: Fair                             ADL either performed or assessed with clinical judgement   ADL Overall ADL's : Needs assistance/impaired Eating/Feeding: Set up;Sitting                   Lower Body Dressing: Maximal assistance;Sitting/lateral leans   Toilet Transfer: Minimal assistance;Rolling walker (2 wheels) Toilet Transfer Details (indicate cue type and reason): simulated, step pivot to bedside chair Toileting- Clothing Manipulation and Hygiene: Maximal assistance;Sit to/from stand              Extremity/Trunk Assessment              Vision       Perception     Praxis     Communication Communication Communication: No apparent difficulties   Cognition Arousal: Alert Behavior During Therapy:  WFL for tasks assessed/performed Cognition: History of cognitive impairments, Cognition impaired         Attention impairment (select first level of impairment): Sustained attention Executive functioning impairment (select all impairments): Reasoning, Problem solving, Initiation                   Following commands: Impaired Following commands impaired: Follows one step commands  with increased time      Cueing   Cueing Techniques: Verbal cues, Tactile cues, Visual cues, Gestural cues  Exercises Other Exercises Other Exercises: edu pt re: role of OT, role of rehab, POC    Shoulder Instructions       General Comments Spo2 >90% on 35 L 35 fio2 HHFNC;    Pertinent Vitals/ Pain       Pain Assessment Pain Assessment: Faces Faces Pain Scale: Hurts even more Pain Location: R knee arthritis Pain Descriptors / Indicators: Discomfort Pain Intervention(s): Monitored during session, Repositioned  Home Living                                          Prior Functioning/Environment              Frequency  Min 2X/week        Progress Toward Goals  OT Goals(current goals can now be found in the care plan section)  Progress towards OT goals: Progressing toward goals  Acute Rehab OT Goals Time For Goal Achievement: 07/17/24  Plan      Co-evaluation                 AM-PAC OT 6 Clicks Daily Activity     Outcome Measure   Help from another person eating meals?: None Help from another person taking care of personal grooming?: None Help from another person toileting, which includes using toliet, bedpan, or urinal?: A Lot Help from another person bathing (including washing, rinsing, drying)?: A Lot Help from another person to put on and taking off regular upper body clothing?: A Little Help from another person to put on and taking off regular lower body clothing?: A Lot 6 Click Score: 17    End of Session Equipment Utilized During Treatment: Rolling walker (2 wheels);Oxygen (HHFNC)  OT Visit Diagnosis: Other abnormalities of gait and mobility (R26.89);Muscle weakness (generalized) (M62.81)   Activity Tolerance Patient limited by fatigue   Patient Left in chair;with call bell/phone within reach;with chair alarm set   Nurse Communication Mobility status        Time: 8962-8894 OT Time Calculation (min): 28  min  Charges: OT General Charges $OT Visit: 1 Visit OT Treatments $Therapeutic Activity: 23-37 mins  Therisa Sheffield, OTD OTR/L  07/05/24, 11:25 AM

## 2024-07-05 NOTE — Progress Notes (Addendum)
 Palliative Care Progress Note, Assessment & Plan   Patient Name: Julie Mcbride       Date: 07/05/2024 DOB: February 05, 1936  Age: 88 y.o. MRN#: [id number] Attending Physician: Dezii, Alexandra, DO Primary Care Physician: Alla Amis, MD Admit Date: 06/30/2024  Subjective: Patient is out of bed and sitting in recliner.  He did have low nasal cannula remains in place.  She is awake, alert, acknowledges my presence, and is able to make her wishes known.  Her son Julie Mcbride is at bedside during my visit.  HPI: 88 y.o. 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    Of note, patient is familiar to our service that she met with my colleague Josh yesterday, 8/4.   Patient is being treated for acute on chronic respiratory failure with hypoxia and hypercapnia, sepsis due to multifocal pneumonia, elevated troponin, bilateral pleural effusions, recent saddle PE s/p thrombectomy with right femoral DVT, stage IV lung cancer with mets to liver and Jones, CKD stage IIIa, right anterior stenosis s/p stent, HTN, generalized weakness with debility.   PMT was consulted to support patient and family with goals of care discussions given patient's widespread metastatic disease.     Summary of counseling/coordination of care: Extensive chart review completed prior to meeting patient including labs, vital signs, imaging, progress notes, orders, and available advanced directive documents from current and previous encounters.    After reviewing the patient's chart and assessing the patient at bedside, I spoke with patient in regards to symptom management and goals of care.   Patient states that she does not want to go home with hospice.  She just wants to go home.  She asked if her cancer treatments could be brought to her house.  She shares she is not able to get back and forth to a clinic.  I clarified that continue with treatment remain she has to have her improved functional status and also be able to get back and forth to appointments at the clinic.  She said oh no I will be doing that.  Patient and son share that their goal is to have patient go home, get stronger, and hopefully be able to tolerate future oncological treatments.  Advised him that patient's widespread metastatic disease is not curable.  Any treatment would be to slow progression/palliative in nature.  He shares that is further down the road.   Reminded patient of autonomy and agency over making her own medical decisions.  She shares she was not clear minded yesterday when she spoke of hospice.  She shares she does not even recall this discussion.  She is adamant today that she wishes to go home.  Therapeutic silence, active listening, and emotional support provided.  Space and opportunity provided for Julie Mcbride and Julie Mcbride to share their thoughts and asked questions.  Therapeutic silence, active listening, and emotional support provided.  Discussed patient's poor functional status is significant contributor to her to be able  to tolerate oncological treatment at this time.  She shares understanding.  Discussed being realistic about patient's widespread metastatic disease as well as remaining hopeful for quality of life.  I am off service after today.  Julie Mcbride is aware and was advised to reach out to the palliative medicine team on-call providers should acute palliative needs arise over the weekend.  Otherwise, I plan to follow-up with patient and family on  Monday.  Patient and Julie Mcbride has PMT contact info.  Above discussion and patient's goals conveyed to attending, pulmonology, and oncology.  No adjustment to plan of care at this time.  Physical Exam Vitals reviewed.  HENT:     Head: Normocephalic.     Mouth/Throat:     Mouth: Mucous membranes are moist.  Eyes:     Pupils: Pupils are equal, round, and reactive to light.  Pulmonary:     Effort: No respiratory distress.     Breath sounds: No wheezing.     Comments: SOB, pursed lip breathing Skin:    General: Skin is warm and dry.     Coloration: Skin is pale.  Neurological:     Mental Status: She is alert and oriented to person, place, and time.  Psychiatric:        Mood and Affect: Mood normal.        Behavior: Behavior normal.        Thought Content: Thought content normal.        Judgment: Judgment normal.             Total Time 35 minutes   Time spent includes: Detailed review of medical records (labs, imaging, vital signs), medically appropriate exam (mental status, respiratory, cardiac, skin), discussed with treatment team, counseling and educating patient, family and staff, documenting clinical information, medication management and coordination of care.  Lamarr L. Arvid, DNP, FNP-BC Palliative Medicine Team

## 2024-07-05 NOTE — Progress Notes (Signed)
 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  Patient O2 was found to be 85% at SNF.  Patient was recently discharged 06/19/2024 for pulmonary embolism.  She reports she was getting stronger at rehab plan was to discharge home next weekend.  Subjective: This morning patient is irritated that there is ongoing discussion about hospice.  Her son Julie Mcbride is at bedside and he is adamant that she will not be discharging home on hospice, patient echoes the same.  There is also ongoing discussion about differences in care plans per family members.  We have agreed to have family meeting later this afternoon at 4 PM. Patient remains on high flow nasal cannula overnight but is amendable to weaning this morning.  She reports she feels much better than she has in the past and continues to endorse significant relief after thoracentesis.  Objective: Vitals:   07/05/24 0729 07/05/24 1107 07/05/24 1319 07/05/24 1350  BP: (!) 137/58 138/62  (!) 154/62  Pulse: 72 65  77  Resp: 16 16    Temp: 98.1 F (36.7 C) 97.9 F (36.6 C)    TempSrc: Oral     SpO2: 98% 99% 100% 96%  Height:        Intake/Output Summary (Last 24 hours) at 07/05/2024 1427 Last data filed at 07/05/2024 1310 Gross per 24 hour  Intake 650 ml  Output 1700 ml  Net -1050 ml   There were no vitals filed for this visit.  Examination: General exam: Appears fatigued Respiratory system: No work of breathing,  symmetric chest wall expansion, high flow nasal cannula in place Cardiovascular system: S1 & S2 heard, RRR.  Gastrointestinal system: Abdomen is nondistended, soft and nontender.  Neuro: Drowsy but arousable.  Oriented Extremities: Symmetric, expected ROM Skin: No rashes, lesions Psychiatry: Easily tearful  Assessment & Plan:  Principal Problem:   Sepsis due to pneumonia [facility]) Active Problems:   Dyslipidemia   Essential hypertension   Ischemic cardiomyopathy   CKD stage 3a, GFR 45-59 ml/min (HCC)   Adenocarcinoma, lung, unspecified laterality (HCC)   Class 2 obesity   Restless leg syndrome   CAD (coronary artery disease)   Dementia (HCC)   Pulmonary embolus (HCC)    Acute on chronic respiratory failure with hypoxia and hypercapnia - On baseline 2 L O2 - Current respiratory failure is multifactorial secondary to lung cancer, pleural effusions, pulmonary edema, multifocal infection - Has consistently been on high flow nasal cannula.  Respiratory therapy consulted today to assist in weaning the nasal cannula if possible. - 8/6 thoracentesis: 1.4 L clear amber fluid aspirated.  She is doing better. - Will begin tapering steroid therapy today.  Received 5 days of 40 mg Methylpred twice daily, taper to 50 mg prednisone  starting tomorrow. - Complete antibiotic course for 7 days total, s/p 3 days azithromycin . Cont ceftriaxone  8/4-8/11 - Pulmonology and cardiology are consulted - As needed morphine  for air hunger and Xanax  for anxiety.  - Respiratory viral panel negative  Sepsis secondary to multifocal pneumonia - On  arrival tachycardic, tachypneic, leukocytosis - Antibiotic plan as above.  Elevated troponin - Likely demand ischemia due to the above.  Troponin peaked around 700s. - Cardiology consulted, unlikely ACS  Bilateral pleural effusions - Minimal improvement with IV Lasix .  Significant improvement after thoracentesis. - Lasix  discontinued. - Status post thoracentesis on  8/6  Recent saddle pulmonary embolism status post thrombectomy Right femoral DVT - Continue Eliquis  - VQ scan negative for acute PE  Stage IV B adenocarcinoma, lung cancer with metastatic spread to liver and bones - Prognosis is unfortunately poor - Oncology Dr. Jacobo and palliative care have been consulted.  Given patient's current functional status there are no cancer therapies available to her at this time.  If she has significant functional improvement and is able to reach to minimum ECOG 2 she may be able to tolerate Keytruda therapy.  This will require office visits at least 3 times weekly and she will need to be able to tolerate wheelchair for office visits.  This therapy would be palliative and not curative.  Ongoing discussions with the family regarding goals of care.  Family meeting scheduled today for 4 PM.    CKD stage IIIa - Stable near baseline  Constipation - Active bowel sounds.  Nontender abdomen. - Last bowel movement 8/6.  As needed enema. - Continue with daily lactulose .  Out of bed when able  Renal artery stenosis status post stent - Continue to monitor kidney function - Continue aspirin   Hypertension - Continue current meds, titrate as needed  Restless leg syndrome - Continue home dose Requip   Generalized weakness Debility Poor prognosis - Family meeting today at 4 PM.  DVT prophylaxis: Eliquis    Code Status: Limited: Do not attempt resuscitation (DNR) -DNR-LIMITED -Do Not Intubate/DNI  Disposition:  Pending final decisions on dispo.  Patient and family considering hospice vs home health.  Consultants:  Treatment Team:  Consulting Physician: Parris Manna, MD  Procedures:    Antimicrobials:  Anti-infectives (From admission, onward)    Start     Dose/Rate Route Frequency Ordered Stop   07/01/24 2200  cefTRIAXone  (ROCEPHIN ) 2 g in sodium chloride  0.9 % 100 mL IVPB        2 g 200 mL/hr over 30 Minutes Intravenous Every 24 hours 06/30/24 2046  07/06/24 1959   07/01/24 2200  azithromycin  (ZITHROMAX ) 500 mg in sodium chloride  0.9 % 250 mL IVPB        500 mg 250 mL/hr over 60 Minutes Intravenous Every 24 hours 06/30/24 2046 07/06/24 1959   06/30/24 2030  azithromycin  (ZITHROMAX ) 500 mg in sodium chloride  0.9 % 250 mL IVPB        500 mg 250 mL/hr over 60 Minutes Intravenous  Once 06/30/24 2024 06/30/24 2205   06/30/24 2030  cefTRIAXone  (ROCEPHIN ) 2 g in sodium chloride  0.9 % 100 mL IVPB        2 g 200 mL/hr over 30 Minutes Intravenous  Once 06/30/24 2024 06/30/24 2102       Data Reviewed: I have personally reviewed following labs and imaging studies CBC: Recent Labs  Lab 06/30/24 1849 07/01/24 0348 07/02/24 0458 07/03/24 0531 07/05/24 0447  WBC 19.3* 10.1 17.0* 16.1* 9.9  NEUTROABS 12.7*  --   --   --   --   HGB 10.7* 9.5* 9.6* 9.5* 8.6*  HCT 34.1* 29.5* 30.8* 29.6* 27.9*  MCV 95.0 92.5 93.3 92.2 94.6  PLT 309 184 207 187 158   Basic Metabolic Panel: Recent Labs  Lab 06/30/24  1849 07/01/24 0348 07/02/24 0458 07/04/24 0434  NA 137 141 142 144  K 4.1 3.9 4.0 4.2  CL 98 101 105 103  CO2 26 29 27 28   GLUCOSE 268* 159* 165* 160*  BUN 28* 32* 46* 63*  CREATININE 1.39* 1.44* 1.49* 1.41*  CALCIUM  9.1 9.2 9.0 9.0  MG  --  2.2 2.1  --    GFR: CrCl cannot be calculated (Unknown ideal weight.). Liver Function Tests: Recent Labs  Lab 06/30/24 1849  AST 33  ALT 20  ALKPHOS 144*  BILITOT 0.7  PROT 6.1*  ALBUMIN  2.9*   CBG: Recent Labs  Lab 07/03/24 2053  GLUCAP 219*    Recent Results (from the past 240 hours)  Culture, blood (routine x 2)     Status: None   Collection Time: 06/30/24  6:50 PM   Specimen: BLOOD  Result Value Ref Range Status   Specimen Description BLOOD RIGHT ANTECUBITAL  Final   Special Requests   Final    BOTTLES DRAWN AEROBIC AND ANAEROBIC Blood Culture adequate volume   Culture   Final    NO GROWTH 5 DAYS Performed at [facility], [address] [place], [place]  [address]    Report Status 07/05/2024 FINAL  Final  Resp panel by RT-PCR (RSV, Flu A&B, Covid) Anterior Nasal Swab     Status: None   Collection Time: 06/30/24  6:55 PM   Specimen: Anterior Nasal Swab  Result Value Ref Range Status   SARS Coronavirus 2 by RT PCR NEGATIVE NEGATIVE Final    Comment: (NOTE) SARS-CoV-2 target nucleic acids are NOT DETECTED.  The SARS-CoV-2 RNA is generally detectable in upper respiratory specimens during the acute phase of infection. The lowest concentration of SARS-CoV-2 viral copies this assay can detect is 138 copies/mL. A negative result does not preclude SARS-Cov-2 infection and should not be used as the sole basis for treatment or other patient management decisions. A negative result may occur with  improper specimen collection/handling, submission of specimen other than nasopharyngeal swab, presence of viral mutation(s) within the areas targeted by this assay, and inadequate number of viral copies(<138 copies/mL). A negative result must be combined with clinical observations, patient history, and epidemiological information. The expected result is Negative.  Fact Sheet for Patients:  BloggerCourse.com  Fact Sheet for Healthcare Providers:  SeriousBroker.it  This test is no t yet approved or cleared by the [place]  FDA and  has been authorized for detection and/or diagnosis of SARS-CoV-2 by FDA under an Emergency Use Authorization (EUA). This EUA will remain  in effect (meaning this test can be used) for the duration of the COVID-19 declaration under Section 564(b)(1) of the Act, 21 U.S.C.section 360bbb-3(b)(1), unless the authorization is terminated  or revoked sooner.       Influenza A by PCR NEGATIVE NEGATIVE Final   Influenza B by PCR NEGATIVE NEGATIVE Final    Comment: (NOTE) The Xpert Xpress SARS-CoV-2/FLU/RSV plus assay is intended as an aid in the diagnosis of influenza from  Nasopharyngeal swab specimens and should not be used as a sole basis for treatment. Nasal washings and aspirates are unacceptable for Xpert Xpress SARS-CoV-2/FLU/RSV testing.  Fact Sheet for Patients: BloggerCourse.com  Fact Sheet for Healthcare Providers: SeriousBroker.it  This test is not yet approved or cleared by the [place]  FDA and has been authorized for detection and/or diagnosis of SARS-CoV-2 by FDA under an Emergency Use Authorization (EUA). This EUA will remain in effect (meaning this test can be  used) for the duration of the COVID-19 declaration under Section 564(b)(1) of the Act, 21 U.S.C. section 360bbb-3(b)(1), unless the authorization is terminated or revoked.     Resp Syncytial Virus by PCR NEGATIVE NEGATIVE Final    Comment: (NOTE) Fact Sheet for Patients: BloggerCourse.com  Fact Sheet for Healthcare Providers: SeriousBroker.it  This test is not yet approved or cleared by the [place]  FDA and has been authorized for detection and/or diagnosis of SARS-CoV-2 by FDA under an Emergency Use Authorization (EUA). This EUA will remain in effect (meaning this test can be used) for the duration of the COVID-19 declaration under Section 564(b)(1) of the Act, 21 U.S.C. section 360bbb-3(b)(1), unless the authorization is terminated or revoked.  Performed at [facility], [address] [place], [place] [address]   Culture, blood (routine x 2)     Status: None   Collection Time: 06/30/24  6:55 PM   Specimen: BLOOD LEFT HAND  Result Value Ref Range Status   Specimen Description BLOOD LEFT HAND  Final   Special Requests   Final    BOTTLES DRAWN AEROBIC AND ANAEROBIC Blood Culture adequate volume   Culture   Final    NO GROWTH 5 DAYS Performed at [facility], [address] [place], [place] [address]    Report Status 07/05/2024 FINAL   Final  Respiratory (~20 pathogens) panel by PCR     Status: None   Collection Time: 06/30/24  6:55 PM   Specimen: Nasopharyngeal Swab; Respiratory  Result Value Ref Range Status   Adenovirus NOT DETECTED NOT DETECTED Final   Coronavirus 229E NOT DETECTED NOT DETECTED Final    Comment: (NOTE) The Coronavirus on the Respiratory Panel, DOES NOT test for the novel  Coronavirus (2019 nCoV)    Coronavirus HKU1 NOT DETECTED NOT DETECTED Final   Coronavirus NL63 NOT DETECTED NOT DETECTED Final   Coronavirus OC43 NOT DETECTED NOT DETECTED Final   Metapneumovirus NOT DETECTED NOT DETECTED Final   Rhinovirus / Enterovirus NOT DETECTED NOT DETECTED Final   Influenza A NOT DETECTED NOT DETECTED Final   Influenza B NOT DETECTED NOT DETECTED Final   Parainfluenza Virus 1 NOT DETECTED NOT DETECTED Final   Parainfluenza Virus 2 NOT DETECTED NOT DETECTED Final   Parainfluenza Virus 3 NOT DETECTED NOT DETECTED Final   Parainfluenza Virus 4 NOT DETECTED NOT DETECTED Final   Respiratory Syncytial Virus NOT DETECTED NOT DETECTED Final   Bordetella pertussis NOT DETECTED NOT DETECTED Final   Bordetella Parapertussis NOT DETECTED NOT DETECTED Final   Chlamydophila pneumoniae NOT DETECTED NOT DETECTED Final   Mycoplasma pneumoniae NOT DETECTED NOT DETECTED Final    Comment: Performed at [facility], [address], [place], [place] [address]  MRSA Next Gen by PCR, Nasal     Status: None   Collection Time: 07/01/24  3:48 AM   Specimen: Nasal Mucosa; Nasal Swab  Result Value Ref Range Status   MRSA by PCR Next Gen NOT DETECTED NOT DETECTED Final    Comment: (NOTE) The GeneXpert MRSA Assay (FDA approved for NASAL specimens only), is one component of a comprehensive MRSA colonization surveillance program. It is not intended to diagnose MRSA infection nor to guide or monitor treatment for MRSA infections. Test performance is not FDA approved in patients less than 86 years old. Performed at [facility], [address] [place], [place] [address]      Radiology Studies: No results found.   Scheduled Meds:  apixaban   5 mg Oral BID   aspirin  EC  81 mg Oral Daily   atorvastatin   10 mg Oral Daily   bisoprolol   10 mg Oral Daily   dextromethorphan -guaiFENesin   1 tablet Oral BID   hydrALAZINE   100 mg Oral BID   lactulose   20 g Oral TID   lidocaine   1 patch Transdermal Q24H   loratadine   10 mg Oral Daily   methylPREDNISolone  (SOLU-MEDROL ) injection  40 mg Intravenous BID   rOPINIRole   0.5 mg Oral TID   Continuous Infusions:  azithromycin  500 mg (07/04/24 2118)   cefTRIAXone  (ROCEPHIN )  IV 2 g (07/04/24 2037)     LOS: 5 days  MDM: Patient is high risk for one or more organ failure.  They necessitate ongoing hospitalization for continued IV therapies and subsequent lab monitoring. Total time spent interpreting labs and vitals, reviewing the medical record, coordinating care amongst consultants and care team members, directly assessing and discussing care with the patient and/or family: 55 min  Nichael Ehly, DO [facility]  To contact the attending physician between 7A-7P please use Epic Chat. To contact the covering physician during after hours 7P-7A, please review Amion.  07/05/2024, 2:27 PM   *This document has been created with the assistance of dictation software. Please excuse typographical errors. *

## 2024-07-05 NOTE — Progress Notes (Signed)
 Physical Therapy Treatment Patient Details Name: Julie Mcbride MRN: [id number] DOB: 07-Sep-1936 Today's Date: 07/05/2024   History of Present Illness Julie Mcbride is an 88 year old female admitted from rehab with Acute on chronic respiratory failure with hypoxia and hypercapnia, Bilateral pleural effusions     PMH significant for recently diagnosed stage Ivb adenocarcinoma of lung with mets to liver and bones, recent PE/DVT, CKD stage IIIa, renal artery stenosis, HTN, HLD, ischemic cardiomyopathy, dementia, obesity    Julie Comments  Julie in recliner on entry, recently transitioned from [facility] to [place] at 6L, sats 96% or higher entire session. Julie unable to report how long she has been in chair. Later husband and son arrive. Julie feels espeically weak today and drained of enerygy. Julie requires minA to rise to standing with max effort, but tolerates only ~60sec of standing as dizziness is triggered, thence upon return to sitting requires ~2-3 minutes for DOE and elevated RR to improve. Vital signs do not offer any explanation for DOE, malaise. Julie does appear to become less alert with these efforts, concerning for orthostatic hypotension, however vitals do not support this hypothesis. Julie able to perform some marching in place toward end of session, however this is incredibly labored and although affirming ability for pivot/toilet transfers today, does not indicated the ability to successfully do much walking. Will continue to follow. Author fixed tele electrodes, changed battery, and added an extension tube for safe transfers performance.    If plan is discharge home, recommend the following: A little help with walking and/or transfers;A little help with bathing/dressing/bathroom;Assistance with cooking/housework;Assist for transportation;Help with stairs or ramp for entrance   Can travel by private vehicle     Yes  Equipment Recommendations  None recommended by Julie    Recommendations for Other Services Rehab  consult     Precautions / Restrictions Precautions Precautions: Fall Restrictions Weight Bearing Restrictions Per Provider Order: No     Mobility  Bed Mobility                    Transfers Overall transfer level: Needs assistance Equipment used: Rolling walker (2 wheels) Transfers: Sit to/from Stand Sit to Stand: Min assist                Ambulation/Gait Ambulation/Gait assistance:  (unable to safely tolerate, shifted focus to transfers and pregait)           Pre-gait activities: marching in place, alternatig x12 (requires ~30seconds and moderate to heavy effort)     Stairs             Wheelchair Mobility     Tilt Bed    Modified Rankin (Stroke Patients Only)       Balance                                            Communication Communication Communication: No apparent difficulties  Cognition                               Julie - Cognition Comments: ST memory limitations today        Cueing    Exercises Other Exercises Other Exercises: STS from recliner to RW with ~60sec sustained standing: minA first time, then elevated surface and supervision for 2nd and 3rd time. Other Exercises:  seated LAQ 2x10 bilat Other Exercises: Marching in place 1x12, alteranting sides moderate ot heavy effort required    General Comments General comments (skin integrity, edema, etc.): Spo2 >90% on 35 L 35 fio2 [facility];      Pertinent Vitals/Pain Pain Assessment Pain Assessment: No/denies pain    Home Living                          Prior Function            Julie Goals (current goals can now be found in the care plan section) Acute Rehab Julie Goals Patient Stated Goal: to go home Julie Goal Formulation: With patient/family Time For Goal Achievement: 07/17/24 Potential to Achieve Goals: Fair Progress towards Julie goals: Not progressing toward goals - comment    Frequency    Min 2X/week      Julie Plan       Co-evaluation              AM-PAC Julie 6 Clicks Mobility   Outcome Measure  Help needed turning from your back to your side while in a flat bed without using bedrails?: A Lot Help needed moving from lying on your back to sitting on the side of a flat bed without using bedrails?: A Lot Help needed moving to and from a bed to a chair (including a wheelchair)?: A Lot Help needed standing up from a chair using your arms (e.g., wheelchair or bedside chair)?: A Lot Help needed to walk in hospital room?: A Lot Help needed climbing 3-5 steps with a railing? : A Lot 6 Click Score: 12    End of Session Equipment Utilized During Treatment: Oxygen Activity Tolerance: Patient limited by fatigue;Patient limited by lethargy Patient left: in chair;with call bell/phone within reach;with nursing/sitter in room;with chair alarm set Nurse Communication: Mobility status Julie Visit Diagnosis: Muscle weakness (generalized) (M62.81);Other abnormalities of gait and mobility (R26.89);Difficulty in walking, not elsewhere classified (R26.2);Unsteadiness on feet (R26.81)     Time: 8684-8654 Julie Time Calculation (min) (ACUTE ONLY): 30 min  Charges:    $Therapeutic Exercise: 8-22 mins $Therapeutic Activity: 8-22 mins Julie General Charges $$ ACUTE Julie VISIT: 1 Visit                    2:03 PM, 07/05/24 Julie Mcbride, Julie, Julie Mcbride Physical Therapist - [facility]  [phone] (ASCOM)    Julie Mcbride 07/05/2024, 1:59 PM

## 2024-07-06 DIAGNOSIS — Z9889 Other specified postprocedural states: Secondary | ICD-10-CM | POA: Diagnosis not present

## 2024-07-06 DIAGNOSIS — J189 Pneumonia, unspecified organism: Secondary | ICD-10-CM | POA: Diagnosis not present

## 2024-07-06 DIAGNOSIS — J90 Pleural effusion, not elsewhere classified: Secondary | ICD-10-CM | POA: Diagnosis not present

## 2024-07-06 DIAGNOSIS — J9601 Acute respiratory failure with hypoxia: Secondary | ICD-10-CM | POA: Diagnosis not present

## 2024-07-06 LAB — CBC WITH DIFFERENTIAL/PLATELET
Abs Immature Granulocytes: 0.14 K/uL — ABNORMAL HIGH (ref 0.00–0.07)
Basophils Absolute: 0 K/uL (ref 0.0–0.1)
Basophils Relative: 0 %
Eosinophils Absolute: 0.1 K/uL (ref 0.0–0.5)
Eosinophils Relative: 1 %
HCT: 29.2 % — ABNORMAL LOW (ref 36.0–46.0)
Hemoglobin: 8.9 g/dL — ABNORMAL LOW (ref 12.0–15.0)
Immature Granulocytes: 1 %
Lymphocytes Relative: 6 %
Lymphs Abs: 0.7 K/uL (ref 0.7–4.0)
MCH: 29.4 pg (ref 26.0–34.0)
MCHC: 30.5 g/dL (ref 30.0–36.0)
MCV: 96.4 fL (ref 80.0–100.0)
Monocytes Absolute: 1.1 K/uL — ABNORMAL HIGH (ref 0.1–1.0)
Monocytes Relative: 9 %
Neutro Abs: 9.8 K/uL — ABNORMAL HIGH (ref 1.7–7.7)
Neutrophils Relative %: 83 %
Platelets: 141 K/uL — ABNORMAL LOW (ref 150–400)
RBC: 3.03 MIL/uL — ABNORMAL LOW (ref 3.87–5.11)
RDW: 14.6 % (ref 11.5–15.5)
WBC: 11.9 K/uL — ABNORMAL HIGH (ref 4.0–10.5)
nRBC: 0 % (ref 0.0–0.2)

## 2024-07-06 LAB — COMPREHENSIVE METABOLIC PANEL WITH GFR
ALT: 26 U/L (ref 0–44)
AST: 38 U/L (ref 15–41)
Albumin: 2.4 g/dL — ABNORMAL LOW (ref 3.5–5.0)
Alkaline Phosphatase: 132 U/L — ABNORMAL HIGH (ref 38–126)
Anion gap: 7 (ref 5–15)
BUN: 48 mg/dL — ABNORMAL HIGH (ref 8–23)
CO2: 30 mmol/L (ref 22–32)
Calcium: 8.7 mg/dL — ABNORMAL LOW (ref 8.9–10.3)
Chloride: 102 mmol/L (ref 98–111)
Creatinine, Ser: 1.12 mg/dL — ABNORMAL HIGH (ref 0.44–1.00)
GFR, Estimated: 48 mL/min — ABNORMAL LOW (ref >60)
Glucose, Bld: 199 mg/dL — ABNORMAL HIGH (ref 70–99)
Potassium: 3.8 mmol/L (ref 3.5–5.1)
Sodium: 139 mmol/L (ref 135–145)
Total Bilirubin: 0.6 mg/dL (ref 0.0–1.2)
Total Protein: 4.8 g/dL — ABNORMAL LOW (ref 6.5–8.1)

## 2024-07-06 MED ORDER — HALOPERIDOL LACTATE 5 MG/ML IJ SOLN: 1.0000 mg | Freq: Once | INTRAMUSCULAR | Status: AC

## 2024-07-06 MED ORDER — METHOCARBAMOL 1000 MG/10ML IJ SOLN
1000.0000 mg | Freq: Once | INTRAMUSCULAR | Status: AC
  Administered 2024-07-06: 1000 mg via INTRAVENOUS
  Filled 2024-07-06: qty 10

## 2024-07-06 MED ORDER — HALOPERIDOL 1 MG PO TABS
1.0000 mg | ORAL_TABLET | Freq: Once | ORAL | Status: AC
  Administered 2024-07-06: 1 mg via ORAL
  Filled 2024-07-06: qty 1

## 2024-07-06 MED ORDER — BACLOFEN 10 MG PO TABS
5.0000 mg | ORAL_TABLET | Freq: Three times a day (TID) | ORAL | Status: DC | PRN
  Administered 2024-07-06 – 2024-07-07 (×2): 5 mg via ORAL
  Filled 2024-07-06 (×3): qty 1

## 2024-07-06 NOTE — Plan of Care (Signed)
  Problem: Education: Goal: Knowledge of General Education information will improve Description: Including pain rating scale, medication(s)/side effects and non-pharmacologic comfort measures Outcome: Progressing   Problem: Health Behavior/Discharge Planning: Goal: Ability to manage health-related needs will improve Outcome: Progressing   Problem: Clinical Measurements: Goal: Ability to maintain clinical measurements within normal limits will improve Outcome: Progressing Goal: Will remain free from infection Outcome: Progressing Goal: Diagnostic test results will improve Outcome: Progressing Goal: Respiratory complications will improve Outcome: Progressing Goal: Cardiovascular complication will be avoided Outcome: Progressing   Problem: Activity: Goal: Risk for activity intolerance will decrease Outcome: Progressing   Problem: Nutrition: Goal: Adequate nutrition will be maintained Outcome: Progressing   Problem: Coping: Goal: Level of anxiety will decrease Outcome: Progressing   Problem: Elimination: Goal: Will not experience complications related to bowel motility Outcome: Progressing Goal: Will not experience complications related to urinary retention Outcome: Progressing   Problem: Pain Managment: Goal: General experience of comfort will improve and/or be controlled Outcome: Progressing   Problem: Safety: Goal: Ability to remain free from injury will improve Outcome: Progressing   Problem: Skin Integrity: Goal: Risk for impaired skin integrity will decrease Outcome: Progressing   Problem: Fluid Volume: Goal: Hemodynamic stability will improve Outcome: Progressing   Problem: Clinical Measurements: Goal: Diagnostic test results will improve Outcome: Progressing Goal: Signs and symptoms of infection will decrease Outcome: Progressing   Problem: Respiratory: Goal: Ability to maintain adequate ventilation will improve Outcome: Progressing   Problem:  Activity: Goal: Ability to tolerate increased activity will improve Outcome: Progressing   Problem: Clinical Measurements: Goal: Ability to maintain a body temperature in the normal range will improve Outcome: Progressing   Problem: Respiratory: Goal: Ability to maintain adequate ventilation will improve Outcome: Progressing Goal: Ability to maintain a clear airway will improve Outcome: Progressing

## 2024-07-06 NOTE — Progress Notes (Addendum)
 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  Patient O2 was found to be 85% at SNF.  Patient was recently discharged 06/19/2024 for pulmonary embolism.  She reports she was getting stronger at rehab plan was to discharge home next weekend.  Subjective: Patient reports some difficulty with pain management today.  Her daughter is at bedside.  Her daughter reports that they slept well overnight but she has had difficulty with restless legs, anxiety, and back pain today. Patient appears to get worked up during our conversation and begins to demonstrate tremors in the lower extremities.  Upon asking patient to close her eyes and focus on deep breathing, tremors resolved.   Objective: Vitals:   07/06/24 0356 07/06/24 0827 07/06/24 0855 07/06/24 1208  BP: 135/62 132/71  (!) 117/54  Pulse: 74 73  63  Resp: 20   (!) 22  Temp: 97.9 F (36.6 C) (!) 97.5 F (36.4 C)  98.2 F (36.8 C)  TempSrc:      SpO2: 96% 97% 96% 98%  Height:        Intake/Output Summary (Last 24 hours) at 07/06/2024 1517 Last data filed at 07/06/2024 0900 Gross per 24 hour  Intake 420 ml  Output 400 ml  Net 20 ml   There were no vitals filed for this visit.  Examination: General exam: Appears fatigued Respiratory system: No work of breathing, symmetric chest wall expansion, 4L Bent in place Cardiovascular system: S1 & S2 heard, RRR.  Gastrointestinal system: Abdomen is nondistended, soft  and nontender.  Neuro: Drowsy but arousable.  Oriented Skin: No rashes, lesions Psychiatry: Easily tearful  Assessment & Plan:  Principal Problem:   Sepsis due to pneumonia [facility]) Active Problems:   Dyslipidemia   Essential hypertension   Ischemic cardiomyopathy   CKD stage 3a, GFR 45-59 ml/min (HCC)   Adenocarcinoma, lung, unspecified laterality (HCC)   Class 2 obesity   Restless leg syndrome   CAD (coronary artery disease)   Dementia (HCC)   Pulmonary embolus (HCC)    Acute on chronic respiratory failure with hypoxia and hypercapnia - On baseline 2 L O2 - Current respiratory failure is multifactorial secondary to lung cancer, pleural effusions, pulmonary edema, multifocal infection - Has tolerated wean to nasal cannula.  Currently on 4 L at 98%.  Anticipate we can wean further. - 8/6 thoracentesis: 1.4 L clear amber fluid aspirated.  She is doing better. - Continue with steroid taper.  Currently on 50 mg prednisone . - Complete antibiotic course for 7 days total, s/p 3 days azithromycin . Cont ceftriaxone  8/4-8/11, can transition to p.o. antibiotics when needed - Pulmonology and cardiology are consulted - As needed morphine  for air hunger and Xanax  for anxiety.  - Respiratory viral panel negative  Sepsis secondary to multifocal pneumonia - On arrival tachycardic, tachypneic, leukocytosis - Antibiotic plan as above.  Elevated troponin - Likely demand ischemia due to the above.  Troponin peaked around 700s. - Cardiology consulted, unlikely ACS  Bilateral pleural effusions - Minimal improvement with IV Lasix .  Significant improvement after thoracentesis. - Lasix  discontinued. - Status post thoracentesis on 8/6  Recent saddle pulmonary embolism status post thrombectomy Right femoral DVT - Continue Eliquis  - VQ scan negative for acute PE  Stage IV B adenocarcinoma, lung cancer with metastatic spread to liver and bones - Prognosis is unfortunately poor - Oncology Dr.  Jacobo and palliative care have been consulted.  Given patient's current functional status there are no cancer therapies available to her at this time.  If she has significant functional improvement and is able to reach to minimum ECOG 2 she may be able to tolerate Keytruda therapy.  This will require office visits at least 3 times weekly and she will need to be able to tolerate wheelchair for office visits.  This therapy would be palliative and not curative.  Ongoing discussions with the family regarding goals of care.  Extensive family meeting 8/8.  Family is still considering their options at this time.  I provided list of skilled nursing facilities to them today for their review.  Discussed with them that patient is nearing discharge readiness and ideally we make a decision regarding home with hospice versus skilled nursing facility discharge within the next 48 hours.  They endorsed understanding.  CKD stage IIIa - Stable near baseline  Constipation - Active bowel sounds.  Nontender abdomen. - Last bowel movement 8/7.  As needed enema. - Continue with daily lactulose .  Out of bed when able  Renal artery stenosis status post stent - Continue to monitor kidney function - Continue aspirin   Hypertension - Continue current meds, titrate as needed  Restless leg syndrome - Continue home dose Requip   Generalized weakness Debility Poor prognosis - Continue daily discussions.  GOC not determined.  Family has asked for more time to pray on this decision.  Will continue to discuss daily  DVT prophylaxis: Eliquis    Code Status: Limited: Do not attempt resuscitation (DNR) -DNR-LIMITED -Do Not Intubate/DNI  Disposition:  Pending final decisions on dispo.  Patient and family considering hospice vs SNF.  Consultants:  Treatment Team:  Consulting Physician: Parris Manna, MD  Procedures:    Antimicrobials:  Anti-infectives (From admission, onward)    Start     Dose/Rate Route Frequency  Ordered Stop   07/01/24 2200  cefTRIAXone  (ROCEPHIN ) 2 g in sodium chloride  0.9 % 100 mL IVPB        2 g 200 mL/hr over 30 Minutes Intravenous Every 24 hours 06/30/24 2046 07/08/24 2359   07/01/24 2200  azithromycin  (ZITHROMAX ) 500 mg in sodium chloride  0.9 % 250 mL IVPB  Status:  Discontinued        500 mg 250 mL/hr over 60 Minutes Intravenous Every 24 hours 06/30/24 2046 07/05/24 1431   06/30/24 2030  azithromycin  (ZITHROMAX ) 500 mg in sodium chloride  0.9 % 250 mL IVPB        500 mg 250 mL/hr over 60 Minutes Intravenous  Once 06/30/24 2024 06/30/24 2205   06/30/24 2030  cefTRIAXone  (ROCEPHIN ) 2 g in sodium chloride  0.9 % 100 mL IVPB        2 g 200 mL/hr over 30 Minutes Intravenous  Once 06/30/24 2024 06/30/24 2102       Data Reviewed: I have personally reviewed following labs and imaging studies CBC: Recent Labs  Lab 06/30/24 1849 07/01/24 0348 07/02/24 0458 07/03/24 0531 07/05/24 0447 07/06/24 1046  WBC 19.3* 10.1 17.0* 16.1* 9.9 11.9*  NEUTROABS 12.7*  --   --   --   --  9.8*  HGB 10.7* 9.5* 9.6* 9.5* 8.6* 8.9*  HCT 34.1* 29.5* 30.8* 29.6* 27.9* 29.2*  MCV 95.0 92.5 93.3 92.2 94.6 96.4  PLT 309 184 207 187 158 141*   Basic Metabolic Panel: Recent Labs  Lab 06/30/24 1849 07/01/24 0348 07/02/24 0458 07/04/24 0434 07/06/24 1046  NA 137 141 142 144 139  K 4.1 3.9 4.0 4.2 3.8  CL 98 101 105 103 102  CO2 26 29 27 28 30   GLUCOSE 268* 159* 165* 160* 199*  BUN 28* 32* 46* 63* 48*  CREATININE 1.39* 1.44* 1.49* 1.41* 1.12*  CALCIUM  9.1 9.2 9.0 9.0 8.7*  MG  --  2.2 2.1  --   --    GFR: CrCl cannot be calculated (Unknown ideal weight.). Liver Function Tests: Recent Labs  Lab 06/30/24 1849 07/06/24 1046  AST 33 38  ALT 20 26  ALKPHOS 144* 132*  BILITOT 0.7 0.6  PROT 6.1* 4.8*  ALBUMIN  2.9* 2.4*   CBG: Recent Labs  Lab 07/03/24 2053  GLUCAP 219*    Recent Results (from the past 240 hours)  Culture, blood (routine x 2)     Status: None   Collection  Time: 06/30/24  6:50 PM   Specimen: BLOOD  Result Value Ref Range Status   Specimen Description BLOOD RIGHT ANTECUBITAL  Final   Special Requests   Final    BOTTLES DRAWN AEROBIC AND ANAEROBIC Blood Culture adequate volume   Culture   Final    NO GROWTH 5 DAYS Performed at [facility], [address] [place], [place] [address]    Report Status 07/05/2024 FINAL  Final  Resp panel by RT-PCR (RSV, Flu A&B, Covid) Anterior Nasal Swab     Status: None   Collection Time: 06/30/24  6:55 PM   Specimen: Anterior Nasal Swab  Result Value Ref Range Status   SARS Coronavirus 2 by RT PCR NEGATIVE NEGATIVE Final    Comment: (NOTE) SARS-CoV-2 target nucleic acids are NOT DETECTED.  The SARS-CoV-2 RNA is generally detectable in upper respiratory specimens during the acute phase of infection. The lowest concentration of SARS-CoV-2 viral copies this assay can detect is 138 copies/mL. A negative result does not preclude SARS-Cov-2 infection and should not be used as the sole basis for treatment or other patient management decisions. A negative result may occur with  improper specimen collection/handling, submission of specimen other than nasopharyngeal swab, presence of viral mutation(s) within the areas targeted by this assay, and inadequate number of viral copies(<138 copies/mL). A negative result must be combined with clinical observations, patient history, and epidemiological information. The expected result is Negative.  Fact Sheet for Patients:  BloggerCourse.com  Fact Sheet for Healthcare Providers:  SeriousBroker.it  This test is no t yet approved or cleared by the [place]  FDA and  has been authorized for detection and/or diagnosis of SARS-CoV-2 by FDA under an Emergency Use Authorization (EUA). This EUA will remain  in effect (meaning this test can be used) for the duration of the COVID-19 declaration under Section  564(b)(1) of the Act, 21 U.S.C.section 360bbb-3(b)(1), unless the authorization is terminated  or revoked sooner.       Influenza A by PCR NEGATIVE NEGATIVE Final   Influenza B by PCR NEGATIVE NEGATIVE Final    Comment: (NOTE) The Xpert Xpress SARS-CoV-2/FLU/RSV plus assay is intended as an aid in the diagnosis of influenza from Nasopharyngeal swab specimens and should not be used as a sole basis for treatment. Nasal washings and aspirates are  unacceptable for Xpert Xpress SARS-CoV-2/FLU/RSV testing.  Fact Sheet for Patients: BloggerCourse.com  Fact Sheet for Healthcare Providers: SeriousBroker.it  This test is not yet approved or cleared by the [place]  FDA and has been authorized for detection and/or diagnosis of SARS-CoV-2 by FDA under an Emergency Use Authorization (EUA). This EUA will remain in effect (meaning this test can be used) for the duration of the COVID-19 declaration under Section 564(b)(1) of the Act, 21 U.S.C. section 360bbb-3(b)(1), unless the authorization is terminated or revoked.     Resp Syncytial Virus by PCR NEGATIVE NEGATIVE Final    Comment: (NOTE) Fact Sheet for Patients: BloggerCourse.com  Fact Sheet for Healthcare Providers: SeriousBroker.it  This test is not yet approved or cleared by the [place]  FDA and has been authorized for detection and/or diagnosis of SARS-CoV-2 by FDA under an Emergency Use Authorization (EUA). This EUA will remain in effect (meaning this test can be used) for the duration of the COVID-19 declaration under Section 564(b)(1) of the Act, 21 U.S.C. section 360bbb-3(b)(1), unless the authorization is terminated or revoked.  Performed at [facility], [address] [place], [place] [address]   Culture, blood (routine x 2)     Status: None   Collection Time: 06/30/24  6:55 PM   Specimen: BLOOD LEFT  HAND  Result Value Ref Range Status   Specimen Description BLOOD LEFT HAND  Final   Special Requests   Final    BOTTLES DRAWN AEROBIC AND ANAEROBIC Blood Culture adequate volume   Culture   Final    NO GROWTH 5 DAYS Performed at [facility], [address] [place], [place] [address]    Report Status 07/05/2024 FINAL  Final  Respiratory (~20 pathogens) panel by PCR     Status: None   Collection Time: 06/30/24  6:55 PM   Specimen: Nasopharyngeal Swab; Respiratory  Result Value Ref Range Status   Adenovirus NOT DETECTED NOT DETECTED Final   Coronavirus 229E NOT DETECTED NOT DETECTED Final    Comment: (NOTE) The Coronavirus on the Respiratory Panel, DOES NOT test for the novel  Coronavirus (2019 nCoV)    Coronavirus HKU1 NOT DETECTED NOT DETECTED Final   Coronavirus NL63 NOT DETECTED NOT DETECTED Final   Coronavirus OC43 NOT DETECTED NOT DETECTED Final   Metapneumovirus NOT DETECTED NOT DETECTED Final   Rhinovirus / Enterovirus NOT DETECTED NOT DETECTED Final   Influenza A NOT DETECTED NOT DETECTED Final   Influenza B NOT DETECTED NOT DETECTED Final   Parainfluenza Virus 1 NOT DETECTED NOT DETECTED Final   Parainfluenza Virus 2 NOT DETECTED NOT DETECTED Final   Parainfluenza Virus 3 NOT DETECTED NOT DETECTED Final   Parainfluenza Virus 4 NOT DETECTED NOT DETECTED Final   Respiratory Syncytial Virus NOT DETECTED NOT DETECTED Final   Bordetella pertussis NOT DETECTED NOT DETECTED Final   Bordetella Parapertussis NOT DETECTED NOT DETECTED Final   Chlamydophila pneumoniae NOT DETECTED NOT DETECTED Final   Mycoplasma pneumoniae NOT DETECTED NOT DETECTED Final    Comment: Performed at [facility], [address], [place], [place] [address]  MRSA Next Gen by PCR, Nasal     Status: None   Collection Time: 07/01/24  3:48 AM   Specimen: Nasal Mucosa; Nasal Swab  Result Value Ref Range Status   MRSA by PCR Next Gen NOT DETECTED NOT DETECTED Final    Comment: (NOTE) The  GeneXpert MRSA Assay (FDA approved for NASAL specimens only), is one component of a comprehensive MRSA  colonization surveillance program. It is not intended to diagnose MRSA infection nor to guide or monitor treatment for MRSA infections. Test performance is not FDA approved in patients less than 67 years old. Performed at [facility], [address] [place], [place] [address]      Radiology Studies: No results found.   Scheduled Meds:  apixaban   5 mg Oral BID   aspirin  EC  81 mg Oral Daily   atorvastatin   10 mg Oral Daily   bisoprolol   10 mg Oral Daily   dextromethorphan -guaiFENesin   1 tablet Oral BID   hydrALAZINE   100 mg Oral BID   lactulose   20 g Oral TID   lidocaine   1 patch Transdermal Q24H   loratadine   10 mg Oral Daily   predniSONE   50 mg Oral Q breakfast   rOPINIRole   0.5 mg Oral TID   Continuous Infusions:  cefTRIAXone  (ROCEPHIN )  IV 2 g (07/05/24 2023)     LOS: 6 days  MDM: Patient is high risk for one or more organ failure.  They necessitate ongoing hospitalization for continued IV therapies and subsequent lab monitoring. Total time spent interpreting labs and vitals, reviewing the medical record, coordinating care amongst consultants and care team members, directly assessing and discussing care with the patient and/or family: 55 min  Jacqulene Huntley, DO [facility]  To contact the attending physician between 7A-7P please use Epic Chat. To contact the covering physician during after hours 7P-7A, please review Amion.  07/06/2024, 3:17 PM   *This document has been created with the assistance of dictation software. Please excuse typographical errors. *

## 2024-07-06 NOTE — Plan of Care (Signed)
  Problem: Education: Goal: Knowledge of General Education information will improve Description: Including pain rating scale, medication(s)/side effects and non-pharmacologic comfort measures Outcome: Progressing   Problem: Clinical Measurements: Goal: Ability to maintain clinical measurements within normal limits will improve Outcome: Progressing Goal: Diagnostic test results will improve Outcome: Progressing Goal: Respiratory complications will improve Outcome: Progressing Goal: Cardiovascular complication will be avoided Outcome: Progressing   Problem: Activity: Goal: Risk for activity intolerance will decrease Outcome: Progressing   Problem: Nutrition: Goal: Adequate nutrition will be maintained Outcome: Progressing   Problem: Coping: Goal: Level of anxiety will decrease Outcome: Progressing   Problem: Pain Managment: Goal: General experience of comfort will improve and/or be controlled Outcome: Progressing   Problem: Safety: Goal: Ability to remain free from injury will improve Outcome: Progressing

## 2024-07-07 DIAGNOSIS — J189 Pneumonia, unspecified organism: Secondary | ICD-10-CM | POA: Diagnosis not present

## 2024-07-07 DIAGNOSIS — Z9889 Other specified postprocedural states: Secondary | ICD-10-CM | POA: Diagnosis not present

## 2024-07-07 DIAGNOSIS — J9601 Acute respiratory failure with hypoxia: Secondary | ICD-10-CM | POA: Diagnosis not present

## 2024-07-07 DIAGNOSIS — J90 Pleural effusion, not elsewhere classified: Secondary | ICD-10-CM | POA: Diagnosis not present

## 2024-07-07 MED ORDER — LORAZEPAM 0.5 MG PO TABS
0.5000 mg | ORAL_TABLET | Freq: Four times a day (QID) | ORAL | Status: DC | PRN
  Administered 2024-07-07: 0.5 mg via ORAL
  Filled 2024-07-07: qty 1

## 2024-07-07 MED ORDER — MORPHINE SULFATE (PF) 2 MG/ML IV SOLN
2.0000 mg | INTRAVENOUS | Status: DC | PRN
  Administered 2024-07-07 – 2024-07-09 (×6): 2 mg via INTRAVENOUS
  Filled 2024-07-07 (×5): qty 1

## 2024-07-07 MED ORDER — METHOCARBAMOL 500 MG PO TABS
500.0000 mg | ORAL_TABLET | Freq: Three times a day (TID) | ORAL | Status: DC | PRN
  Administered 2024-07-07 – 2024-07-08 (×5): 500 mg via ORAL
  Filled 2024-07-07 (×3): qty 1

## 2024-07-07 NOTE — Plan of Care (Signed)
  Problem: Education: Goal: Knowledge of General Education information will improve Description: Including pain rating scale, medication(s)/side effects and non-pharmacologic comfort measures Outcome: Progressing   Problem: Nutrition: Goal: Adequate nutrition will be maintained Outcome: Progressing   Problem: Coping: Goal: Level of anxiety will decrease Outcome: Progressing   Problem: Elimination: Goal: Will not experience complications related to bowel motility Outcome: Progressing Goal: Will not experience complications related to urinary retention Outcome: Progressing   Problem: Safety: Goal: Ability to remain free from injury will improve Outcome: Progressing

## 2024-07-07 NOTE — Progress Notes (Signed)
 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  Patient O2 was found to be 85% at SNF.  Patient was recently discharged 06/19/2024 for pulmonary embolism.  She reports she was getting stronger at rehab plan was to discharge home next weekend.  Subjective: Pt reports ongoing issues with restless legs and tremor overnight. Multiple family members visiting today.  Asked patient how she is doing she said she is ready to get her angels wings and is ready to go home and fly.  Objective: Vitals:   07/07/24 0425 07/07/24 0835 07/07/24 1138 07/07/24 1538  BP: (!) 131/54 (!) 121/46 (!) 128/55 (!) 134/57  Pulse: 71 74 76 74  Resp: 20     Temp: 97.8 F (36.6 C) 99.1 F (37.3 C) 99.1 F (37.3 C) 98 F (36.7 C)  TempSrc: Axillary     SpO2: 95% 97% 96% 94%  Height:        Intake/Output Summary (Last 24 hours) at 07/07/2024 1709 Last data filed at 07/07/2024 1140 Gross per 24 hour  Intake 100 ml  Output 1150 ml  Net -1050 ml   There were no vitals filed for this visit.  Examination: General exam: Appears fatigued Respiratory system: No work of breathing, symmetric chest wall expansion, 4L [place] in place Cardiovascular system: S1 & S2 heard, RRR.  Gastrointestinal system: Abdomen is nondistended, soft and nontender.  Neuro: Drowsy but arousable.  Oriented Skin: No rashes, lesions Psychiatry: Easily tearful  Assessment & Plan:  Principal Problem:   Sepsis  due to pneumonia [facility]) Active Problems:   Dyslipidemia   Essential hypertension   Ischemic cardiomyopathy   CKD stage 3a, GFR 45-59 ml/min (HCC)   Adenocarcinoma, lung, unspecified laterality (HCC)   Class 2 obesity   Restless leg syndrome   CAD (coronary artery disease)   Dementia (HCC)   Pulmonary embolus (HCC)    Acute on chronic respiratory failure with hypoxia and hypercapnia - On baseline 2 L O2, nearing this now.  Continue to wean as tolerated - Current respiratory failure is multifactorial secondary to lung cancer, pleural effusions, pulmonary edema, multifocal infection - 8/6 thoracentesis: 1.4 L clear amber fluid aspirated.  She is doing better. - Continue with steroid taper.  Currently on 50 mg prednisone . - Complete antibiotic course for 7 days total, s/p 3 days azithromycin . Cont ceftriaxone  8/4-8/11, can transition to p.o. antibiotics when needed - Pulmonology and cardiology are consulted - As needed morphine  for air hunger and Xanax  for anxiety.  - Respiratory viral panel negative  Leg tremors - These appear exacerbated by anxiety.  They are distractible, and well-controlled with deep breathing. - Will proceed with Ativan  as needed - Have advised family on conservative measures to help patient control the symptoms  Sepsis secondary to multifocal pneumonia - On arrival tachycardic, tachypneic, leukocytosis - Antibiotic plan as above.  Elevated troponin - Likely demand ischemia due to the above.  Troponin peaked around 700s. - Cardiology consulted, unlikely ACS  Bilateral pleural effusions - Minimal improvement with IV Lasix .  Significant improvement after thoracentesis. - Lasix  discontinued. - Status post thoracentesis on 8/6  Recent saddle pulmonary embolism status post thrombectomy Right femoral DVT - Continue Eliquis  - VQ scan negative for acute PE  Stage IV B adenocarcinoma, lung cancer with metastatic spread to liver and bones - Prognosis is  unfortunately poor - Oncology Dr. Jacobo and palliative care have been consulted.  Given patient's current functional status there are no cancer therapies available to her at this time.  If she has significant functional improvement and is able to reach to minimum ECOG 2 she may be able to tolerate Keytruda therapy.  This will require office visits at least 3 times weekly and she will need to be able to tolerate wheelchair for office visits.  This therapy would be palliative and not curative.  Ongoing discussions with the family regarding goals of care.  Extensive family meeting 8/8.  Family is still considering their options at this time.  I provided list of skilled nursing facilities to them today for their review.  Discussed with them that patient is nearing discharge readiness and ideally we make a decision regarding home with hospice versus skilled nursing facility discharge within the next 48 hours.  They endorsed understanding.  CKD stage IIIa - Stable near baseline  Constipation - Active bowel sounds.  Nontender abdomen. - Last bowel movement 8/7.  As needed enema. - Continue with daily lactulose .  Out of bed when able  Renal artery stenosis status post stent - Continue to monitor kidney function - Continue aspirin   Hypertension - Continue current meds, titrate as needed  Restless leg syndrome - Continue home dose Requip   Generalized weakness Debility Poor prognosis - Continue daily discussions.  GOC not determined.  Family has asked for more time to pray on their decision.  Awaiting final dispo plans  DVT prophylaxis: Eliquis    Code Status: Limited: Do not attempt resuscitation (DNR) -DNR-LIMITED -Do Not Intubate/DNI  Disposition:  Pending final decisions on dispo.  Patient and family considering hospice vs SNF.  Consultants:  Treatment Team:  Consulting Physician: Parris Manna, MD  Procedures:    Antimicrobials:  Anti-infectives (From admission, onward)     Start     Dose/Rate Route Frequency Ordered Stop   07/01/24 2200  cefTRIAXone  (ROCEPHIN ) 2 g in sodium chloride  0.9 % 100 mL IVPB        2 g 200 mL/hr over 30 Minutes Intravenous Every 24 hours 06/30/24 2046 07/08/24 2359   07/01/24 2200  azithromycin  (ZITHROMAX ) 500 mg in sodium chloride  0.9 % 250 mL IVPB  Status:  Discontinued        500 mg 250 mL/hr over 60 Minutes Intravenous Every 24 hours 06/30/24 2046 07/05/24 1431   06/30/24 2030  azithromycin  (ZITHROMAX ) 500 mg in sodium chloride  0.9 % 250 mL IVPB        500 mg 250 mL/hr over 60 Minutes Intravenous  Once 06/30/24 2024 06/30/24 2205   06/30/24 2030  cefTRIAXone  (ROCEPHIN ) 2 g in sodium chloride  0.9 % 100 mL IVPB        2 g 200 mL/hr over 30 Minutes Intravenous  Once 06/30/24 2024 06/30/24 2102       Data Reviewed: I have personally reviewed following labs and imaging studies CBC: Recent Labs  Lab 06/30/24 1849 07/01/24 0348 07/02/24 0458 07/03/24 0531 07/05/24 0447 07/06/24 1046  WBC 19.3* 10.1 17.0* 16.1* 9.9 11.9*  NEUTROABS 12.7*  --   --   --   --  9.8*  HGB 10.7* 9.5* 9.6* 9.5* 8.6* 8.9*  HCT 34.1* 29.5* 30.8* 29.6* 27.9* 29.2*  MCV 95.0 92.5 93.3 92.2 94.6 96.4  PLT 309 184 207 187 158 141*   Basic Metabolic Panel: Recent Labs  Lab 06/30/24 1849 07/01/24 0348 07/02/24 0458 07/04/24 0434 07/06/24 1046  NA 137 141 142 144 139  K 4.1 3.9 4.0 4.2 3.8  CL 98 101 105 103 102  CO2 26 29 27 28 30   GLUCOSE 268* 159* 165* 160* 199*  BUN 28* 32* 46* 63* 48*  CREATININE 1.39* 1.44* 1.49* 1.41* 1.12*  CALCIUM  9.1 9.2 9.0 9.0 8.7*  MG  --  2.2 2.1  --   --    GFR: CrCl cannot be calculated (Unknown ideal weight.). Liver Function Tests: Recent Labs  Lab 06/30/24 1849 07/06/24 1046  AST 33 38  ALT 20 26  ALKPHOS 144* 132*  BILITOT 0.7 0.6  PROT 6.1* 4.8*  ALBUMIN  2.9* 2.4*   CBG: Recent Labs  Lab 07/03/24 2053  GLUCAP 219*    Recent Results (from the past 240 hours)  Culture, blood (routine x 2)      Status: None   Collection Time: 06/30/24  6:50 PM   Specimen: BLOOD  Result Value Ref Range Status   Specimen Description BLOOD RIGHT ANTECUBITAL  Final   Special Requests   Final    BOTTLES DRAWN AEROBIC AND ANAEROBIC Blood Culture adequate volume   Culture   Final    NO GROWTH 5 DAYS Performed at [facility], [address] [place], [place] [address]    Report Status 07/05/2024 FINAL  Final  Resp panel by RT-PCR (RSV, Flu A&B, Covid) Anterior Nasal Swab     Status: None   Collection Time: 06/30/24  6:55 PM   Specimen: Anterior Nasal Swab  Result Value Ref Range Status   SARS Coronavirus 2 by RT PCR NEGATIVE NEGATIVE Final    Comment: (NOTE) SARS-CoV-2 target nucleic acids are NOT DETECTED.  The SARS-CoV-2 RNA is generally detectable in upper respiratory specimens during the acute phase of infection. The lowest concentration of SARS-CoV-2 viral copies this assay can detect is 138 copies/mL. A negative result does not preclude SARS-Cov-2 infection and should not be used as the sole basis for treatment or other patient management decisions. A negative result may occur with  improper specimen collection/handling, submission of specimen other than nasopharyngeal swab, presence of viral mutation(s) within the areas targeted by this assay, and inadequate number of viral copies(<138 copies/mL). A negative result must be combined with clinical observations, patient history, and epidemiological information. The expected result is Negative.  Fact Sheet for Patients:  BloggerCourse.com  Fact Sheet for Healthcare Providers:  SeriousBroker.it  This test is no t yet approved or cleared by the [place]  FDA and  has been authorized for detection and/or diagnosis of SARS-CoV-2 by FDA under an Emergency Use Authorization (EUA). This EUA will remain  in effect (meaning this test can be used) for the duration of  the COVID-19 declaration under Section 564(b)(1) of the Act, 21 U.S.C.section 360bbb-3(b)(1), unless the authorization is terminated  or revoked sooner.       Influenza A by PCR NEGATIVE NEGATIVE Final   Influenza B by PCR NEGATIVE NEGATIVE Final    Comment: (NOTE) The Xpert Xpress SARS-CoV-2/FLU/RSV plus assay is intended as an aid in the diagnosis of influenza from Nasopharyngeal swab specimens and should not be used as a sole basis for treatment. Nasal washings and aspirates are  unacceptable for Xpert Xpress SARS-CoV-2/FLU/RSV testing.  Fact Sheet for Patients: BloggerCourse.com  Fact Sheet for Healthcare Providers: SeriousBroker.it  This test is not yet approved or cleared by the [place]  FDA and has been authorized for detection and/or diagnosis of SARS-CoV-2 by FDA under an Emergency Use Authorization (EUA). This EUA will remain in effect (meaning this test can be used) for the duration of the COVID-19 declaration under Section 564(b)(1) of the Act, 21 U.S.C. section 360bbb-3(b)(1), unless the authorization is terminated or revoked.     Resp Syncytial Virus by PCR NEGATIVE NEGATIVE Final    Comment: (NOTE) Fact Sheet for Patients: BloggerCourse.com  Fact Sheet for Healthcare Providers: SeriousBroker.it  This test is not yet approved or cleared by the [place]  FDA and has been authorized for detection and/or diagnosis of SARS-CoV-2 by FDA under an Emergency Use Authorization (EUA). This EUA will remain in effect (meaning this test can be used) for the duration of the COVID-19 declaration under Section 564(b)(1) of the Act, 21 U.S.C. section 360bbb-3(b)(1), unless the authorization is terminated or revoked.  Performed at [facility], [address] [place], [place] [address]   Culture, blood (routine x 2)     Status: None   Collection Time:  06/30/24  6:55 PM   Specimen: BLOOD LEFT HAND  Result Value Ref Range Status   Specimen Description BLOOD LEFT HAND  Final   Special Requests   Final    BOTTLES DRAWN AEROBIC AND ANAEROBIC Blood Culture adequate volume   Culture   Final    NO GROWTH 5 DAYS Performed at [facility], [address] [place], [place] [address]    Report Status 07/05/2024 FINAL  Final  Respiratory (~20 pathogens) panel by PCR     Status: None   Collection Time: 06/30/24  6:55 PM   Specimen: Nasopharyngeal Swab; Respiratory  Result Value Ref Range Status   Adenovirus NOT DETECTED NOT DETECTED Final   Coronavirus 229E NOT DETECTED NOT DETECTED Final    Comment: (NOTE) The Coronavirus on the Respiratory Panel, DOES NOT test for the novel  Coronavirus (2019 nCoV)    Coronavirus HKU1 NOT DETECTED NOT DETECTED Final   Coronavirus NL63 NOT DETECTED NOT DETECTED Final   Coronavirus OC43 NOT DETECTED NOT DETECTED Final   Metapneumovirus NOT DETECTED NOT DETECTED Final   Rhinovirus / Enterovirus NOT DETECTED NOT DETECTED Final   Influenza A NOT DETECTED NOT DETECTED Final   Influenza B NOT DETECTED NOT DETECTED Final   Parainfluenza Virus 1 NOT DETECTED NOT DETECTED Final   Parainfluenza Virus 2 NOT DETECTED NOT DETECTED Final   Parainfluenza Virus 3 NOT DETECTED NOT DETECTED Final   Parainfluenza Virus 4 NOT DETECTED NOT DETECTED Final   Respiratory Syncytial Virus NOT DETECTED NOT DETECTED Final   Bordetella pertussis NOT DETECTED NOT DETECTED Final   Bordetella Parapertussis NOT DETECTED NOT DETECTED Final   Chlamydophila pneumoniae NOT DETECTED NOT DETECTED Final   Mycoplasma pneumoniae NOT DETECTED NOT DETECTED Final    Comment: Performed at [facility], [address] [place], [place] [address]  MRSA Next Gen by PCR, Nasal     Status: None   Collection Time: 07/01/24  3:48 AM   Specimen: Nasal Mucosa; Nasal Swab  Result Value Ref Range Status   MRSA by PCR Next Gen NOT DETECTED  NOT DETECTED Final    Comment: (NOTE) The GeneXpert MRSA Assay (FDA approved for NASAL specimens only), is one component of a comprehensive MRSA  colonization surveillance program. It is not intended to diagnose MRSA infection nor to guide or monitor treatment for MRSA infections. Test performance is not FDA approved in patients less than 67 years old. Performed at [facility], [address], [place], [place] [address]      Radiology Studies: No results found.   Scheduled Meds:  apixaban   5 mg Oral BID   aspirin  EC  81 mg Oral Daily   atorvastatin   10 mg Oral Daily   bisoprolol   10 mg Oral Daily   dextromethorphan -guaiFENesin   1 tablet Oral BID   hydrALAZINE   100 mg Oral BID   lactulose   20 g Oral TID   lidocaine   1 patch Transdermal Q24H   loratadine   10 mg Oral Daily   predniSONE   50 mg Oral Q breakfast   rOPINIRole   0.5 mg Oral TID   Continuous Infusions:  cefTRIAXone  (ROCEPHIN )  IV 2 g (07/06/24 2057)     LOS: 7 days  MDM: Patient is high risk for one or more organ failure.  They necessitate ongoing hospitalization for continued IV therapies and subsequent lab monitoring. Total time spent interpreting labs and vitals, reviewing the medical record, coordinating care amongst consultants and care team members, directly assessing and discussing care with the patient and/or family: 55 min  Malyssa Maris, DO [facility]  To contact the attending physician between 7A-7P please use Epic Chat. To contact the covering physician during after hours 7P-7A, please review Amion.  07/07/2024, 5:09 PM   *This document has been created with the assistance of dictation software. Please excuse typographical errors. *

## 2024-07-08 ENCOUNTER — Encounter: Payer: Self-pay | Admitting: *Deleted

## 2024-07-08 DIAGNOSIS — J189 Pneumonia, unspecified organism: Secondary | ICD-10-CM | POA: Diagnosis not present

## 2024-07-08 DIAGNOSIS — J90 Pleural effusion, not elsewhere classified: Secondary | ICD-10-CM | POA: Diagnosis not present

## 2024-07-08 DIAGNOSIS — J9601 Acute respiratory failure with hypoxia: Secondary | ICD-10-CM | POA: Diagnosis not present

## 2024-07-08 DIAGNOSIS — Z9889 Other specified postprocedural states: Secondary | ICD-10-CM | POA: Diagnosis not present

## 2024-07-08 DIAGNOSIS — Z515 Encounter for palliative care: Secondary | ICD-10-CM | POA: Diagnosis not present

## 2024-07-08 DIAGNOSIS — C3490 Malignant neoplasm of unspecified part of unspecified bronchus or lung: Secondary | ICD-10-CM | POA: Diagnosis not present

## 2024-07-08 LAB — CBC
HCT: 31.6 % — ABNORMAL LOW (ref 36.0–46.0)
Hemoglobin: 9.8 g/dL — ABNORMAL LOW (ref 12.0–15.0)
MCH: 29.3 pg (ref 26.0–34.0)
MCHC: 31 g/dL (ref 30.0–36.0)
MCV: 94.3 fL (ref 80.0–100.0)
Platelets: 133 K/uL — ABNORMAL LOW (ref 150–400)
RBC: 3.35 MIL/uL — ABNORMAL LOW (ref 3.87–5.11)
RDW: 14.6 % (ref 11.5–15.5)
WBC: 14.1 K/uL — ABNORMAL HIGH (ref 4.0–10.5)
nRBC: 0 % (ref 0.0–0.2)

## 2024-07-08 MED ORDER — PREDNISONE 20 MG PO TABS: 20.0000 mg | ORAL_TABLET | Freq: Every day | ORAL | Status: DC

## 2024-07-08 NOTE — Progress Notes (Signed)
 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  Patient O2 was found to be 85% at SNF.  Patient was recently discharged 06/19/2024 for pulmonary embolism.   OT comments  Pt seen for OT treatment on this date. Upon arrival to room pt relaxing in bed with family at bedside, agreeable to tx. Pt requires MINA for supine<>sitting on the EOB with extra time for pt to complete. Pt tolerated prolong sitting on the EOB with supervision. Pt often became SOB with bilateral tremors seemingly gets worse when fatigued. Pt STS from the EOB with RW and MODA +2, pt very fearful of falling. Pt took small steps up the head of the bed with the RW + MINA +2 with heavy verbal cues and DME management throughout. Pt returned to bed with MAXA+2, positioned in bed level chair position. Pt making fair progress toward goals, will continue to follow POC. Discharge recommendation remains appropriate.        If plan is discharge home, recommend the following:  A lot of help with walking and/or transfers;A lot of help with bathing/dressing/bathroom;Help with stairs or ramp for entrance;Assistance with cooking/housework;Direct supervision/assist for medications management;Direct supervision/assist for financial management;Assist for transportation   Equipment Recommendations  Other (comment)    Recommendations for Other Services      Precautions / Restrictions Precautions Precautions: Fall Recall of Precautions/Restrictions:  Impaired Restrictions Weight Bearing Restrictions Per Provider Order: No       Mobility Bed Mobility Overal bed mobility: Needs Assistance Bed Mobility: Supine to Sit, Sit to Supine     Supine to sit: Min assist Sit to supine: Max assist, +2 for physical assistance   General bed mobility comments: Fatigued post prolong sitting and mobility attempts, required MAXA+ 2 to return to supine    Transfers Overall transfer level: Needs assistance Equipment used: Rolling walker (2 wheels) Transfers: Sit to/from Stand Sit to Stand: Mod assist, +2 physical assistance           General transfer comment: MOD+2 for STS, MIN+2 for taking small steps up the head of bed     Balance Overall balance assessment: Needs assistance Sitting-balance support: Feet supported, No upper extremity supported Sitting balance-Leahy Scale: Good     Standing balance support: During functional activity, Bilateral upper extremity supported, Reliant on assistive device for balance Standing balance-Leahy Scale: Fair Standing balance comment: Reliant on RW to maintain balance in standing                           ADL either performed or assessed with clinical judgement   ADL Overall ADL's : Needs assistance/impaired Eating/Feeding: Supervision/ safety;Sitting Eating/Feeding Details (indicate cue type and reason): Verbal cues for safe swallowing Grooming: Wash/dry hands;Set up;Sitting               Lower Body Dressing: Maximal assistance;Bed level         Toileting - Clothing Manipulation Details (indicate cue type and reason): Anticipate MAXA for pericare     Functional mobility during ADLs: Rolling walker (2 wheels);Maximal assistance;+2 for physical  assistance General ADL Comments: MAXA LB dressing at bed level, setupA for face washing while seated on the EOB     Communication Communication Communication: No apparent difficulties   Cognition Arousal: Alert Behavior During  Therapy: WFL for tasks assessed/performed Cognition: History of cognitive impairments, Cognition impaired           Executive functioning impairment (select all impairments): Reasoning, Problem solving, Initiation OT - Cognition Comments: Seemly A/Ox4                 Following commands: Impaired Following commands impaired: Follows one step commands with increased time      Cueing   Cueing Techniques: Verbal cues, Tactile cues, Visual cues, Gestural cues  Exercises Exercises: Other exercises Other Exercises Other Exercises: Edu: Role of OT session, safe ADL completion, benefits of OOB mobility    Shoulder Instructions       General Comments RN in room for meds, pt on 4L/min via [place] at rest, required increase to 5L/min with observered SOB during mobility. Pt remained <90% pre/post mobility. Pt returned to 4L/min once back in supine.    Pertinent Vitals/ Pain       Pain Assessment Pain Assessment: PAINAD Faces Pain Scale: Hurts whole lot Breathing: occasional labored breathing, short period of hyperventilation Negative Vocalization: occasional moan/groan, low speech, negative/disapproving quality Facial Expression: sad, frightened, frown Body Language: tense, distressed pacing, fidgeting Consolability: distracted or reassured by voice/touch PAINAD Score: 5 Pain Location: R lower back pain Pain Descriptors / Indicators: Discomfort Pain Intervention(s): Limited activity within patient's tolerance, Monitored during session, RN gave pain meds during session                                                          Frequency  Min 2X/week        Progress Toward Goals  OT Goals(current goals can now be found in the care plan section)  Progress towards OT goals: Progressing toward goals  Acute Rehab OT Goals OT Goal Formulation: With patient/family Time For Goal Achievement: 07/17/24 Potential to Achieve Goals: Fair ADL Goals Pt Will  Perform Grooming: with set-up;sitting Pt Will Perform Lower Body Dressing: with min assist;sitting/lateral leans;sit to/from stand Pt Will Transfer to Toilet: with contact guard assist;ambulating Pt Will Perform Toileting - Clothing Manipulation and hygiene: with min assist;sitting/lateral leans;sit to/from stand  Plan      Co-evaluation    PT/OT/SLP Co-Evaluation/Treatment: Yes Reason for Co-Treatment: For patient/therapist safety;To address functional/ADL transfers;Complexity of the patient's impairments (multi-system involvement) PT goals addressed during session: Mobility/safety with mobility;Balance;Proper use of DME;Strengthening/ROM OT goals addressed during session: ADL's and self-care      AM-PAC OT 6 Clicks Daily Activity     Outcome Measure   Help from another person eating meals?: A Lot Help from another person taking care of personal grooming?: A Lot Help from another person toileting, which includes using toliet, bedpan, or urinal?: A Lot Help from another person bathing (including washing, rinsing, drying)?: A Lot Help from another person to put on and taking off regular upper body clothing?: A Lot Help from another person to put on and taking off regular lower body clothing?: A Lot 6 Click Score: 12    End of Session Equipment Utilized During Treatment: Rolling walker (2 wheels);Oxygen  OT Visit Diagnosis: Other abnormalities  of gait and mobility (R26.89);Muscle weakness (generalized) (M62.81)   Activity Tolerance Patient limited by fatigue   Patient Left with call bell/phone within reach;with chair alarm set;in bed   Nurse Communication Mobility status        Time: 8955-8876 OT Time Calculation (min): 39 min  Charges: OT General Charges $OT Visit: 1 Visit OT Treatments $Self Care/Home Management : 8-22 mins  Larraine Colas M.S. OTR/L  07/08/24, 12:34 PM

## 2024-07-08 NOTE — Plan of Care (Signed)

## 2024-07-08 NOTE — Progress Notes (Signed)
 Palliative Care Progress Note, Assessment & Plan   Patient Name: Julie Mcbride       Date: 07/08/2024 DOB: 21-Jun-1936  Age: 88 y.o. MRN#: [id number] Attending Physician: Dezii, Alexandra, DO Primary Care Physician: Alla Amis, MD Admit Date: 06/30/2024  Subjective: Patient is sitting at edge of bed with occupational therapist at bedside.  Patient's daughter and son-in-law are at bedside.  My visit.  Patient is awake, alert, acknowledges deficits, and is able to make her wishes known.  HPI: 88 y.o. 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    Of note, patient is familiar to our service that she met with my colleague Josh yesterday, 8/4.   Patient is being treated for acute on chronic respiratory failure with hypoxia and hypercapnia, sepsis due to multifocal pneumonia, elevated troponin, bilateral pleural effusions, recent saddle PE s/p thrombectomy with right femoral DVT, stage IV lung cancer with mets to liver and Jones, CKD stage IIIa, right anterior stenosis s/p stent, HTN, generalized weakness with debility.   PMT was consulted to support patient and family with goals of care discussions given patient's widespread metastatic disease.     Summary of counseling/coordination of care: Extensive chart review completed prior to meeting patient including labs, vital signs, imaging, progress notes, orders, and available advanced directive documents from current and previous encounters.    After reviewing the patient's chart and assessing the patient at bedside, I spoke with patient in regards to symptom management and goals of care.   Patient shares that he is not a good candidate.  She shares she is going to be completing some college.  Endorses pain in her lower back.  Daughter has applied lidocaine  patch.  Discussed use of Robaxin  as a muscle relaxer to help relieve pain.  Patient is in agreement.  She is significantly short of breath.  With 2 person assist she returns back to bed.  She appears more frail, weak, and being worn out from activities this morning than she did during her visit last week.  Adjustment made to add Robaxin  to as needed list.  After meeting with the patient, I spoke with her daughter [name] outside of the room and Therese's request.  She also shares that she does not believe family can provide 24/7 care at home.  [name] shares she completed her time and her mother And nursing facility.  However, she does not want to make this decision given that her mother still has capacity and agency to make her own decisions.  [name] shares concern the patient has had some progression and decline over the weekend.  She is requiring higher levels of oxygen.  Discussed patient's prognosis is overall poor given her widespread metastatic adenocarcinoma.  Discussed aggressive pain management despite not being curable treatment options.  [name] shares she Is hopeful that today she has been just a bad day and the patient can have a better day tomorrow.  Therapeutic silence, active  listening, and  No adjustment or changes to plan of care at this time.  PMT will continue to follow and support.  Physical Exam Vitals reviewed.  Constitutional:      General: She is in acute distress.     Appearance: She is obese.  HENT:     Mouth/Throat:     Mouth: Mucous membranes are moist.  Eyes:     Pupils: Pupils are equal, round, and reactive to light.  Cardiovascular:      Pulses: Normal pulses.  Pulmonary:     Comments: SOB Abdominal:     Palpations: Abdomen is soft.  Musculoskeletal:     Comments: Generalized weakness  Skin:    General: Skin is warm and dry.  Neurological:     Mental Status: She is alert and oriented to person, place, and time.  Psychiatric:        Mood and Affect: Mood normal.        Behavior: Behavior normal.        Thought Content: Thought content normal.        Judgment: Judgment normal.             Total Time 35 minutes   Time spent includes: Detailed review of medical records (labs, imaging, vital signs), medically appropriate exam (mental status, respiratory, cardiac, skin), discussed with treatment team, counseling and educating patient, family and staff, documenting clinical information, medication management and coordination of care.  Lamarr L. Arvid, DNP, FNP-BC Palliative Medicine Team

## 2024-07-08 NOTE — TOC Progression Note (Signed)
 Transition of Care [facility]) - Progression Note    Patient Details  Name: Julie Mcbride MRN: [id number] Date of Birth: 09-Aug-1936  [facility] CM/SW Contact  Lauraine JAYSON Carpen, LCSW Phone Number: 07/08/2024, 2:13 PM  Clinical Narrative:  Met with patient, daughter, and son-in-law. Discussed discharge home with hospice vs home with home health vs SNF for rehab vs. SNF with hospice. Family meeting should take place later this afternoon to determine plan. Patient does not have a wheelchair, hospital bed, or oxygen at home.   Expected Discharge Plan and Services                                               Social Drivers of Health (SDOH) Interventions SDOH Screenings   Food Insecurity: No Food Insecurity (07/01/2024)  Housing: Low Risk  (07/01/2024)  Transportation Needs: No Transportation Needs (07/01/2024)  Utilities: Not At Risk (07/01/2024)  Depression (PHQ2-9): Low Risk  (05/30/2024)  Financial Resource Strain: Low Risk  (05/15/2024)   Received from [facility]  Social Connections: Socially Integrated (07/01/2024)  Tobacco Use: Low Risk  (06/30/2024)    Readmission Risk Interventions     No data to display

## 2024-07-08 NOTE — Progress Notes (Signed)
 Physical Therapy Treatment Patient Details Name: Julie Mcbride MRN: [id number] DOB: 04-Jul-1936 Today's Date: 07/08/2024   History of Present Illness Julie Mcbride is an 88 year old female with complicated past medical history including recently diagnosed stage IV adenocarcinoma of lung with mets to liver and bones, recent PE/DVT, CKD stage IIIa, renal artery stenosis, hypertension, hyperlipidemia, ischemic cardiomyopathy, dementia, obesity, who presents to the ED from [facility] for evaluation of shortness of breath and hypoxia.  Patient O2 was found to be 85% at SNF.  Patient was recently discharged 06/19/2024 for pulmonary embolism.    PT Comments  Patient requiring increased assistance this date. On 4L O2 on arrival but increased to 5L for mobility as patient with notable SOB at rest and with mobility. Required minA for supine>sit and modA+2 to stand from EOB with RW. Unable to take more than 2-3 sidesteps at EOB. Complaining of increased fatigue and required seated break and declining further attempts. 3/4 DOE with standing. Able to stand for ~1-2 minutes prior to sitting. Discharge plan remains appropriate.    If plan is discharge home, recommend the following: A little help with walking and/or transfers;A little help with bathing/dressing/bathroom;Assistance with cooking/housework;Assist for transportation;Help with stairs or ramp for entrance   Can travel by private vehicle     Yes  Equipment Recommendations  None recommended by PT    Recommendations for Other Services       Precautions / Restrictions Precautions Precautions: Fall Recall of Precautions/Restrictions: Impaired Restrictions Weight Bearing Restrictions Per Provider Order: No     Mobility  Bed Mobility Overal bed mobility: Needs Assistance Bed Mobility: Supine to Sit, Sit to Supine     Supine to sit: Min assist Sit to supine: Max assist, +2 for physical assistance   General bed mobility comments:  Fatigued post prolong sitting and mobility attempts, required MAXA+ 2 to return to supine    Transfers Overall transfer level: Needs assistance Equipment used: Rolling [place] Bertha (2 wheels) Transfers: Sit to/from Stand Sit to Stand: Mod assist, +2 physical assistance           General transfer comment: MOD+2 for STS, MIN+2 for taking small steps up the head of bed, fatigues after 1-2 minutes. Unable to complete transfer to recliner    Ambulation/Gait                   Stairs             Wheelchair Mobility     Tilt Bed    Modified Rankin (Stroke Patients Only)       Balance Overall balance assessment: Needs assistance Sitting-balance support: Feet supported, No upper extremity supported Sitting balance-Leahy Scale: Good     Standing balance support: During functional activity, Bilateral upper extremity supported, Reliant on assistive device for balance Standing balance-Leahy Scale: Fair Standing balance comment: Reliant on RW to maintain balance in standing                            Communication Communication Communication: No apparent difficulties  Cognition Arousal: Alert Behavior During Therapy: WFL for tasks assessed/performed   PT - Cognitive impairments: No apparent impairments                         Following commands: Impaired Following commands impaired: Follows one step commands with increased time    Cueing Cueing Techniques: Verbal cues, Tactile cues, Visual cues, Gestural cues  Exercises      General Comments General comments (skin integrity, edema, etc.): RN in room for meds, pt on 4L/min via [place] at rest, required increase to 5L/min with observered SOB during mobility. Pt remained <90% pre/post mobility. Pt returned to 4L/min once back in supine.      Pertinent Vitals/Pain Pain Assessment Pain Assessment: Faces Faces Pain Scale: Hurts whole lot Pain Location: R lower back pain Pain Descriptors /  Indicators: Discomfort Pain Intervention(s): Limited activity within patient's tolerance, Monitored during session, Repositioned    Home Living                          Prior Function            PT Goals (current goals can now be found in the care plan section) Acute Rehab PT Goals Patient Stated Goal: to go home PT Goal Formulation: With patient/family Time For Goal Achievement: 07/17/24 Potential to Achieve Goals: Fair Progress towards PT goals: Progressing toward goals    Frequency    Min 2X/week      PT Plan      Co-evaluation PT/OT/SLP Co-Evaluation/Treatment: Yes Reason for Co-Treatment: For patient/therapist safety;To address functional/ADL transfers;Complexity of the patient's impairments (multi-system involvement) PT goals addressed during session: Mobility/safety with mobility;Balance;Proper use of DME;Strengthening/ROM OT goals addressed during session: ADL's and self-care      AM-PAC PT 6 Clicks Mobility   Outcome Measure  Help needed turning from your back to your side while in a flat bed without using bedrails?: A Lot Help needed moving from lying on your back to sitting on the side of a flat bed without using bedrails?: A Lot Help needed moving to and from a bed to a chair (including a wheelchair)?: A Lot Help needed standing up from a chair using your arms (e.g., wheelchair or bedside chair)?: A Lot Help needed to walk in hospital room?: A Lot Help needed climbing 3-5 steps with a railing? : A Lot 6 Click Score: 12    End of Session Equipment Utilized During Treatment: Oxygen Activity Tolerance: Patient limited by fatigue;Patient limited by lethargy Patient left: in bed;with call bell/phone within reach;with bed alarm set;with family/visitor present Nurse Communication: Mobility status PT Visit Diagnosis: Muscle weakness (generalized) (M62.81);Other abnormalities of gait and mobility (R26.89);Difficulty in walking, not elsewhere  classified (R26.2);Unsteadiness on feet (R26.81)     Time: 8955-8876 PT Time Calculation (min) (ACUTE ONLY): 39 min  Charges:    $Therapeutic Activity: 23-37 mins PT General Charges $$ ACUTE PT VISIT: 1 Visit                     Maryanne Finder, PT, DPT Physical Therapist - [facility]  [facility]   Man Bonneau A Santhiago Collingsworth 07/08/2024, 2:29 PM

## 2024-07-08 NOTE — Progress Notes (Addendum)
 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  Patient O2 was found to be 85% at SNF.  Patient was recently discharged 06/19/2024 for pulmonary embolism.  She reports she was getting stronger at rehab plan was to discharge home next weekend.  Subjective: No acute events overnight.  Patient had active day yesterday with multiple different visitors and family members coming to see her.  This morning she is complaining of some right arm pain around the right antecubital fossa.  Her daughter, [name] is at bedside.  When discussing dispo her daughter reports they have not made a decision and they would like more time.  Objective: Vitals:   07/08/24 0844 07/08/24 0931 07/08/24 1157 07/08/24 1539  BP: (!) 161/65 (!) 143/58 (!) 132/52 (!) 143/63  Pulse:  74 65 73  Resp:  20 19 20   Temp:  97.6 F (36.4 C) (!) 97.5 F (36.4 C) 97.6 F (36.4 C)  TempSrc:      SpO2:  98% 99% 98%  Height:        Intake/Output Summary (Last 24 hours) at 07/08/2024 1628 Last data filed at 07/08/2024 0900 Gross per 24 hour  Intake 580 ml  Output 900 ml  Net -320 ml   There were no vitals filed for this visit.  Examination: General exam: Appears fatigued, frail.  Occasional moaning in pain Respiratory system: No work of breathing, symmetric chest wall expansion, 4L [place] in place Cardiovascular system: S1 & S2 heard, RRR.  Gastrointestinal system: Abdomen is nondistended, soft and  nontender.  Neuro: Drowsy but arousable.  Oriented Skin: No rashes, lesions Psychiatry: Easily tearful  Assessment & Plan:  Principal Problem:   Sepsis due to pneumonia [facility]) Active Problems:   Dyslipidemia   Essential hypertension   Ischemic cardiomyopathy   CKD stage 3a, GFR 45-59 ml/min (HCC)   Adenocarcinoma, lung, unspecified laterality (HCC)   Class 2 obesity   Restless leg syndrome   CAD (coronary artery disease)   Dementia (HCC)   Pulmonary embolus (HCC)    Acute on chronic respiratory failure with hypoxia and hypercapnia - On baseline 2 L O2, nearing this now.  Continue to wean as tolerated - Current respiratory failure is multifactorial secondary to lung cancer, pleural effusions, pulmonary edema, multifocal infection - 8/6 thoracentesis: 1.4 L clear amber fluid aspirated.  She is doing better. - Continue with steroid taper.  - Status post 7-day antibiotic course - Pulmonology and cardiology are consulted, no further interventions to add - As needed morphine  for air hunger and Xanax  for anxiety.  - Respiratory viral panel negative  Leg tremors - These appear exacerbated by anxiety.  They are distractible, and well-controlled with deep breathing. - Will proceed with Ativan  as needed - Have advised family on conservative measures to help patient control the symptoms  Sepsis secondary to multifocal pneumonia - On arrival tachycardic, tachypneic, leukocytosis - Antibiotic plan as above.  Elevated troponin - Likely demand ischemia due to the above.  Troponin peaked around 700s. - Cardiology consulted, unlikely  ACS  Bilateral pleural effusions - Minimal improvement with IV Lasix .  Significant improvement after thoracentesis. - Lasix  discontinued. - Status post thoracentesis on 8/6 - Effusions are likely to recur  Recent saddle pulmonary embolism status post thrombectomy Right femoral DVT - Continue Eliquis  - VQ scan negative for acute PE  Stage IV B  adenocarcinoma, lung cancer with metastatic spread to liver and bones - Prognosis is unfortunately poor - Oncology Dr. Jacobo and palliative care have been consulted.  Given patient's current functional status there are no cancer therapies available to her at this time.  If she has significant functional improvement and is able to reach to minimum ECOG 2 she may be able to tolerate Keytruda therapy.  This will require office visits at least 3 times weekly and she will need to be able to tolerate wheelchair for office visits.  This therapy would be palliative and not curative.  Ongoing discussions with the family regarding goals of care.  Extensive family meeting 8/8, and many times thereafter.  Patient has achieved medical readiness for discharge.  Family still considering options.  They have not decided if they would like to discharge with hospice versus skilled nursing facility for rehab.   CKD stage IIIa - Stable near baseline  Constipation - Active bowel sounds.  Nontender abdomen. - Last bowel movement 8/7.  As needed enema. - Continue with daily lactulose .  Out of bed when able  Renal artery stenosis status post stent - Continue to monitor kidney function - Continue aspirin   Hypertension - Continue current meds, titrate as needed  Restless leg syndrome - Continue home dose Requip   Generalized weakness Debility Poor prognosis - We are continuing daily discussions.  As of today goals of care still not determined.  I have advised family that she is medically ready for discharge pending their decision.  TOC is also aware.  Patient remains very clear that she would like to be discharged home with hospice.  Family endorses some concerns about their ability to care for her at home even on a hospice basis.  On separate occasions family feels that the patient has significant hope to rehab and are not interested in hospice.  DVT prophylaxis: Eliquis    Code Status: Limited: Do not attempt  resuscitation (DNR) -DNR-LIMITED -Do Not Intubate/DNI  Disposition:  Medically ready for Discharge. Pending final decisions on dispo.  Patient and family considering hospice vs SNF.  Consultants:  Treatment Team:  Consulting Physician: Parris Manna, MD  Procedures:    Antimicrobials:  Anti-infectives (From admission, onward)    Start     Dose/Rate Route Frequency Ordered Stop   07/01/24 2200  cefTRIAXone  (ROCEPHIN ) 2 g in sodium chloride  0.9 % 100 mL IVPB        2 g 200 mL/hr over 30 Minutes Intravenous Every 24 hours 06/30/24 2046 07/08/24 2359   07/01/24 2200  azithromycin  (ZITHROMAX ) 500 mg in sodium chloride  0.9 % 250 mL IVPB  Status:  Discontinued        500 mg 250 mL/hr over 60 Minutes Intravenous Every 24 hours 06/30/24 2046 07/05/24 1431   06/30/24 2030  azithromycin  (ZITHROMAX ) 500 mg in sodium chloride  0.9 % 250 mL IVPB        500 mg 250 mL/hr over 60 Minutes Intravenous  Once 06/30/24 2024 06/30/24 2205   06/30/24 2030  cefTRIAXone  (ROCEPHIN ) 2 g in sodium chloride  0.9 % 100 mL IVPB        2 g 200 mL/hr over 30 Minutes  Intravenous  Once 06/30/24 2024 06/30/24 2102       Data Reviewed: I have personally reviewed following labs and imaging studies CBC: Recent Labs  Lab 07/02/24 0458 07/03/24 0531 07/05/24 0447 07/06/24 1046 07/08/24 0455  WBC 17.0* 16.1* 9.9 11.9* 14.1*  NEUTROABS  --   --   --  9.8*  --   HGB 9.6* 9.5* 8.6* 8.9* 9.8*  HCT 30.8* 29.6* 27.9* 29.2* 31.6*  MCV 93.3 92.2 94.6 96.4 94.3  PLT 207 187 158 141* 133*   Basic Metabolic Panel: Recent Labs  Lab 07/02/24 0458 07/04/24 0434 07/06/24 1046  NA 142 144 139  K 4.0 4.2 3.8  CL 105 103 102  CO2 27 28 30   GLUCOSE 165* 160* 199*  BUN 46* 63* 48*  CREATININE 1.49* 1.41* 1.12*  CALCIUM  9.0 9.0 8.7*  MG 2.1  --   --    GFR: CrCl cannot be calculated (Unknown ideal weight.). Liver Function Tests: Recent Labs  Lab 07/06/24 1046  AST 38  ALT 26  ALKPHOS 132*  BILITOT 0.6  PROT  4.8*  ALBUMIN  2.4*   CBG: Recent Labs  Lab 07/03/24 2053  GLUCAP 219*    Recent Results (from the past 240 hours)  Culture, blood (routine x 2)     Status: None   Collection Time: 06/30/24  6:50 PM   Specimen: BLOOD  Result Value Ref Range Status   Specimen Description BLOOD RIGHT ANTECUBITAL  Final   Special Requests   Final    BOTTLES DRAWN AEROBIC AND ANAEROBIC Blood Culture adequate volume   Culture   Final    NO GROWTH 5 DAYS Performed at [facility], [address] [place], [place] [address]    Report Status 07/05/2024 FINAL  Final  Resp panel by RT-PCR (RSV, Flu A&B, Covid) Anterior Nasal Swab     Status: None   Collection Time: 06/30/24  6:55 PM   Specimen: Anterior Nasal Swab  Result Value Ref Range Status   SARS Coronavirus 2 by RT PCR NEGATIVE NEGATIVE Final    Comment: (NOTE) SARS-CoV-2 target nucleic acids are NOT DETECTED.  The SARS-CoV-2 RNA is generally detectable in upper respiratory specimens during the acute phase of infection. The lowest concentration of SARS-CoV-2 viral copies this assay can detect is 138 copies/mL. A negative result does not preclude SARS-Cov-2 infection and should not be used as the sole basis for treatment or other patient management decisions. A negative result may occur with  improper specimen collection/handling, submission of specimen other than nasopharyngeal swab, presence of viral mutation(s) within the areas targeted by this assay, and inadequate number of viral copies(<138 copies/mL). A negative result must be combined with clinical observations, patient history, and epidemiological information. The expected result is Negative.  Fact Sheet for Patients:  BloggerCourse.com  Fact Sheet for Healthcare Providers:  SeriousBroker.it  This test is no t yet approved or cleared by the [place]  FDA and  has been authorized for detection and/or diagnosis of  SARS-CoV-2 by FDA under an Emergency Use Authorization (EUA). This EUA will remain  in effect (meaning this test can be used) for the duration of the COVID-19 declaration under Section 564(b)(1) of the Act, 21 U.S.C.section 360bbb-3(b)(1), unless the authorization is terminated  or revoked sooner.       Influenza A by PCR NEGATIVE NEGATIVE Final   Influenza B by PCR NEGATIVE NEGATIVE Final    Comment: (NOTE) The Xpert Xpress SARS-CoV-2/FLU/RSV plus assay is intended as an  aid in the diagnosis of influenza from Nasopharyngeal swab specimens and should not be used as a sole basis for treatment. Nasal washings and aspirates are unacceptable for Xpert Xpress SARS-CoV-2/FLU/RSV testing.  Fact Sheet for Patients: BloggerCourse.com  Fact Sheet for Healthcare Providers: SeriousBroker.it  This test is not yet approved or cleared by the [place]  FDA and has been authorized for detection and/or diagnosis of SARS-CoV-2 by FDA under an Emergency Use Authorization (EUA). This EUA will remain in effect (meaning this test can be used) for the duration of the COVID-19 declaration under Section 564(b)(1) of the Act, 21 U.S.C. section 360bbb-3(b)(1), unless the authorization is terminated or revoked.     Resp Syncytial Virus by PCR NEGATIVE NEGATIVE Final    Comment: (NOTE) Fact Sheet for Patients: BloggerCourse.com  Fact Sheet for Healthcare Providers: SeriousBroker.it  This test is not yet approved or cleared by the [place]  FDA and has been authorized for detection and/or diagnosis of SARS-CoV-2 by FDA under an Emergency Use Authorization (EUA). This EUA will remain in effect (meaning this test can be used) for the duration of the COVID-19 declaration under Section 564(b)(1) of the Act, 21 U.S.C. section 360bbb-3(b)(1), unless the authorization is terminated  or revoked.  Performed at [facility], [address] [place], [place] [address]   Culture, blood (routine x 2)     Status: None   Collection Time: 06/30/24  6:55 PM   Specimen: BLOOD LEFT HAND  Result Value Ref Range Status   Specimen Description BLOOD LEFT HAND  Final   Special Requests   Final    BOTTLES DRAWN AEROBIC AND ANAEROBIC Blood Culture adequate volume   Culture   Final    NO GROWTH 5 DAYS Performed at [facility], [address] [place], [place] [address]    Report Status 07/05/2024 FINAL  Final  Respiratory (~20 pathogens) panel by PCR     Status: None   Collection Time: 06/30/24  6:55 PM   Specimen: Nasopharyngeal Swab; Respiratory  Result Value Ref Range Status   Adenovirus NOT DETECTED NOT DETECTED Final   Coronavirus 229E NOT DETECTED NOT DETECTED Final    Comment: (NOTE) The Coronavirus on the Respiratory Panel, DOES NOT test for the novel  Coronavirus (2019 nCoV)    Coronavirus HKU1 NOT DETECTED NOT DETECTED Final   Coronavirus NL63 NOT DETECTED NOT DETECTED Final   Coronavirus OC43 NOT DETECTED NOT DETECTED Final   Metapneumovirus NOT DETECTED NOT DETECTED Final   Rhinovirus / Enterovirus NOT DETECTED NOT DETECTED Final   Influenza A NOT DETECTED NOT DETECTED Final   Influenza B NOT DETECTED NOT DETECTED Final   Parainfluenza Virus 1 NOT DETECTED NOT DETECTED Final   Parainfluenza Virus 2 NOT DETECTED NOT DETECTED Final   Parainfluenza Virus 3 NOT DETECTED NOT DETECTED Final   Parainfluenza Virus 4 NOT DETECTED NOT DETECTED Final   Respiratory Syncytial Virus NOT DETECTED NOT DETECTED Final   Bordetella pertussis NOT DETECTED NOT DETECTED Final   Bordetella Parapertussis NOT DETECTED NOT DETECTED Final   Chlamydophila pneumoniae NOT DETECTED NOT DETECTED Final   Mycoplasma pneumoniae NOT DETECTED NOT DETECTED Final    Comment: Performed at [facility], [address] [place], [place] [address]  MRSA Next Gen by PCR,  Nasal     Status: None   Collection Time: 07/01/24  3:48 AM   Specimen: Nasal Mucosa; Nasal Swab  Result Value Ref Range Status   MRSA by PCR Next Gen NOT  DETECTED NOT DETECTED Final    Comment: (NOTE) The GeneXpert MRSA Assay (FDA approved for NASAL specimens only), is one component of a comprehensive MRSA colonization surveillance program. It is not intended to diagnose MRSA infection nor to guide or monitor treatment for MRSA infections. Test performance is not FDA approved in patients less than 56 years old. Performed at [facility], [address], [place], [place] [address]      Radiology Studies: No results found.   Scheduled Meds:  apixaban   5 mg Oral BID   aspirin  EC  81 mg Oral Daily   atorvastatin   10 mg Oral Daily   bisoprolol   10 mg Oral Daily   dextromethorphan -guaiFENesin   1 tablet Oral BID   hydrALAZINE   100 mg Oral BID   lactulose   20 g Oral TID   lidocaine   1 patch Transdermal Q24H   loratadine   10 mg Oral Daily   predniSONE   50 mg Oral Q breakfast   rOPINIRole   0.5 mg Oral TID   Continuous Infusions:  cefTRIAXone  (ROCEPHIN )  IV 2 g (07/07/24 2100)     LOS: 8 days  MDM: Patient is high risk for one or more organ failure.  They necessitate ongoing hospitalization for continued IV therapies and subsequent lab monitoring. Total time spent interpreting labs and vitals, reviewing the medical record, coordinating care amongst consultants and care team members, directly assessing and discussing care with the patient and/or family: 55 min  Calaya Gildner, DO [facility]  To contact the attending physician between 7A-7P please use Epic Chat. To contact the covering physician during after hours 7P-7A, please review Amion.  07/08/2024, 4:28 PM   *This document has been created with the assistance of dictation software. Please excuse typographical errors. *

## 2024-07-09 DIAGNOSIS — J90 Pleural effusion, not elsewhere classified: Secondary | ICD-10-CM | POA: Diagnosis not present

## 2024-07-09 DIAGNOSIS — J9601 Acute respiratory failure with hypoxia: Secondary | ICD-10-CM | POA: Diagnosis not present

## 2024-07-09 DIAGNOSIS — J189 Pneumonia, unspecified organism: Secondary | ICD-10-CM | POA: Diagnosis not present

## 2024-07-09 DIAGNOSIS — C3490 Malignant neoplasm of unspecified part of unspecified bronchus or lung: Secondary | ICD-10-CM | POA: Diagnosis not present

## 2024-07-09 MED ORDER — GLYCOPYRROLATE 0.2 MG/ML IJ SOLN
0.2000 mg | INTRAMUSCULAR | Status: DC | PRN
  Administered 2024-07-09 (×4): 0.2 mg via INTRAVENOUS
  Filled 2024-07-09 (×3): qty 1

## 2024-07-09 MED ORDER — HALOPERIDOL 0.5 MG PO TABS: 0.5000 mg | ORAL_TABLET | ORAL | Status: DC | PRN

## 2024-07-09 MED ORDER — BIOTENE DRY MOUTH MT LIQD
15.0000 mL | OROMUCOSAL | Status: DC | PRN
Start: 2024-07-09 — End: 2024-07-10

## 2024-07-09 MED ORDER — ACETAMINOPHEN 325 MG PO TABS
650.0000 mg | ORAL_TABLET | Freq: Four times a day (QID) | ORAL | Status: DC | PRN
Start: 2024-07-09 — End: 2024-07-10
  Filled 2024-07-09: qty 2

## 2024-07-09 MED ORDER — HALOPERIDOL LACTATE 2 MG/ML PO CONC
0.5000 mg | ORAL | Status: DC | PRN
Start: 2024-07-09 — End: 2024-07-09

## 2024-07-09 MED ORDER — ONDANSETRON 4 MG PO TBDP: 4.0000 mg | ORAL_TABLET | Freq: Four times a day (QID) | ORAL | Status: DC | PRN

## 2024-07-09 MED ORDER — HALOPERIDOL LACTATE 5 MG/ML IJ SOLN
0.5000 mg | INTRAMUSCULAR | Status: DC | PRN
  Administered 2024-07-09 (×2): 0.5 mg via INTRAVENOUS
  Filled 2024-07-09 (×2): qty 1

## 2024-07-09 MED ORDER — MORPHINE 100MG IN NS 100ML (1MG/ML) PREMIX INFUSION
1.0000 mg/h | INTRAVENOUS | Status: DC
  Administered 2024-07-09 (×2): 1 mg/h via INTRAVENOUS
  Filled 2024-07-09: qty 100

## 2024-07-09 MED ORDER — LORAZEPAM 1 MG PO TABS: 1.0000 mg | ORAL_TABLET | ORAL | Status: DC | PRN

## 2024-07-09 MED ORDER — ACETAMINOPHEN 650 MG RE SUPP: 650.0000 mg | Freq: Four times a day (QID) | RECTAL | Status: DC | PRN

## 2024-07-09 MED ORDER — POLYVINYL ALCOHOL 1.4 % OP SOLN: 1.0000 [drp] | Freq: Four times a day (QID) | OPHTHALMIC | Status: DC | PRN

## 2024-07-09 MED ORDER — LORAZEPAM 2 MG/ML IJ SOLN: 1.0000 mg | INTRAMUSCULAR | Status: DC | PRN

## 2024-07-09 MED ORDER — MORPHINE BOLUS VIA INFUSION
1.0000 mg | INTRAVENOUS | Status: DC | PRN
  Administered 2024-07-09 (×12): 1 mg via INTRAVENOUS

## 2024-07-09 MED ORDER — ONDANSETRON HCL 4 MG/2ML IJ SOLN: 4.0000 mg | Freq: Four times a day (QID) | INTRAMUSCULAR | Status: DC | PRN

## 2024-07-09 MED ORDER — DIAZEPAM 5 MG/ML IJ SOLN
2.5000 mg | INTRAMUSCULAR | Status: DC | PRN
  Administered 2024-07-09 (×4): 2.5 mg via INTRAVENOUS
  Filled 2024-07-09 (×2): qty 2

## 2024-07-09 MED ORDER — GLYCOPYRROLATE 0.2 MG/ML IJ SOLN
0.2000 mg | INTRAMUSCULAR | Status: DC | PRN
  Filled 2024-07-09: qty 1

## 2024-07-09 MED ORDER — HALOPERIDOL LACTATE 5 MG/ML IJ SOLN: 0.5000 mg | INTRAMUSCULAR | Status: DC | PRN

## 2024-07-09 MED ORDER — HALOPERIDOL LACTATE 2 MG/ML PO CONC: 0.5000 mg | ORAL | Status: DC | PRN

## 2024-07-09 MED ORDER — GLYCOPYRROLATE 1 MG PO TABS: 1.0000 mg | ORAL_TABLET | ORAL | Status: DC | PRN

## 2024-07-09 MED ORDER — MORPHINE SULFATE (PF) 2 MG/ML IV SOLN
2.0000 mg | INTRAVENOUS | Status: DC | PRN
  Administered 2024-07-09 (×2): 2 mg via INTRAVENOUS
  Filled 2024-07-09: qty 1

## 2024-07-10 ENCOUNTER — Inpatient Hospital Stay: Admitting: Oncology

## 2024-07-10 ENCOUNTER — Inpatient Hospital Stay

## 2024-07-10 ENCOUNTER — Encounter: Payer: Self-pay | Admitting: *Deleted

## 2024-07-29 NOTE — Progress Notes (Signed)
 EOS Pt has difficulty swallowing d/t dyspnea & difficulty breathing Pt & family expressed concern about morphine  dosage - pt has received it before with no adverse effects -  Discussed morphine  for air hunger, pt and family expressed understanding and agreed to receive Pt received morphine  2mg  PRN at 0241 this shift with effectiveness

## 2024-07-29 NOTE — Progress Notes (Signed)
 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  Patient O2 was found to be 85% at SNF.  Patient was recently discharged 06/19/2024 for pulmonary embolism.  She reports she was getting stronger at rehab with plan to discharge home the next weekend.  Unfortunately patient's respiratory status declined and she required readmission.  Acute respiratory failure during this admission is multifactorial secondary to pneumonia, pleural effusion, and widespread malignancy.  She underwent thoracentesis with removal of 1.4 L of fluid and had some improvement, but ultimately respiratory status began to decline again.  Patient became increasingly agitated, anxious, and was endorsing diffuse pain.  After extensive family discussions through her stay they ultimately decided to transition to hospice and proceed with comfort measures only in the hospital.  Subjective: This morning patient is moaning in pain.  She is requesting additional pain management.  Prior to my arrival palliative care has met with the family and they have agreed to transition to comfort measures only now.  I have ordered for morphine  drip.  Reportedly patient's daughter has requested we hold off on starting drip until more family can arrive at bedside.     Objective: Vitals:   08/04/2024 0358 08/04/24 0833 04-Aug-2024 0838 2024/08/04 0840  BP: (!) 153/60 110/72  110/66  Pulse: 82 66  75  Resp: 20 17  20   Temp: 98.3 F  (36.8 C) 98 F (36.7 C)  97.8 F (36.6 C)  TempSrc:      SpO2: 97% 96%  97%  Height:   5' 6 (1.676 m)     Intake/Output Summary (Last 24 hours) at 2024-08-04 1531 Last data filed at 04-Aug-2024 0900 Gross per 24 hour  Intake 0 ml  Output 650 ml  Net -650 ml   There were no vitals filed for this visit.  Examination: General exam: Appears fatigued, frail.  Occasional moaning in pain Respiratory system: No work of breathing, symmetric chest wall expansion, 4L [place] in place Cardiovascular system: S1 & S2 heard, RRR.  Gastrointestinal system: Abdomen is nondistended, soft and nontender.  Neuro: Drowsy but arousable.  Oriented Skin: No rashes, lesions Psychiatry: Easily tearful  Assessment & Plan:  Principal Problem:   Sepsis due to pneumonia [facility]) Active Problems:   Dyslipidemia   Essential hypertension   Ischemic cardiomyopathy   CKD stage 3a, GFR 45-59 ml/min (HCC)   Adenocarcinoma, lung, unspecified laterality (HCC)   Class 2 obesity   Restless leg syndrome   CAD (coronary artery disease)   Dementia (HCC)   Pulmonary embolus (HCC)     Current measures only - Patient has transitioned to comfort measures only on 2024-08-04.  Currently on morphine  drip, increasing oxygen requirements, dyspnea with minimal exertion. - Palliative care team has been consulted.  We have discontinued all medications that do not provide immediate comfort - Turn off alarms - Discontinue all lab draws - We will continue to monitor patient in house.  At this time anticipate in-hospital death, unlikely to tolerate transition to alternative unit or facility.  If  patient stabilizes over the next 24 hours we can consider inpatient hospice evaluation.   Acute on chronic respiratory failure with hypoxia and hypercapnia - On baseline 2 L O2 - Current respiratory failure is multifactorial secondary to lung cancer, pleural effusions, pulmonary edema, multifocal infection - 8/6 thoracentesis: 1.4 L clear amber  fluid aspirated.  She is doing better. - Has been on steroids.  Status post 7-day antibiotic course. - Pulmonology and cardiology are consulted, no further interventions to add - As needed morphine  for air hunger and Valium  for anxiety.  - Respiratory viral panel negative  Leg tremors - These appear exacerbated by anxiety.  They are distractible, and well-controlled with deep breathing. - Will proceed with as needed Valium  - Have advised family on conservative measures to help patient control the symptoms  Sepsis secondary to multifocal pneumonia - On arrival tachycardic, tachypneic, leukocytosis - Antibiotic plan as above.  Elevated troponin - Likely demand ischemia due to the above.  Troponin peaked around 700s. - Cardiology consulted, unlikely ACS  Bilateral pleural effusions - Minimal improvement with IV Lasix .  Significant improvement after thoracentesis. - Lasix  discontinued. - Status post thoracentesis on 8/6 - Effusions are likely to recur  Recent saddle pulmonary embolism status post thrombectomy Right femoral DVT - Continue Eliquis  - VQ scan negative for acute PE  Stage IV B adenocarcinoma, lung cancer with metastatic spread to liver and bones - Prognosis is unfortunately poor - Oncology Dr. Jacobo and palliative care have been consulted during this admission  CKD stage IIIa - Stable near baseline  Constipation, resolved - Active bowel sounds.  Nontender abdomen. - Last bowel movement 8/9.    Renal artery stenosis status post stent - Continue to monitor kidney function  Hypertension - Continue current meds, titrate as needed  Restless leg syndrome  Generalized weakness Debility Poor prognosis - Now comfort measures only.  See above  DVT prophylaxis: Eliquis    Code Status: Do not attempt resuscitation (DNR) - Comfort care Disposition: For measures only.  Anticipate in-hospital death.  If patient stabilizes over the next 24 hours can consider transfer  to inpatient hospice  Consultants:  Treatment Team:  Consulting Physician: Parris Manna, MD  Procedures:    Antimicrobials:  Anti-infectives (From admission, onward)    Start     Dose/Rate Route Frequency Ordered Stop   07/01/24 2200  cefTRIAXone  (ROCEPHIN ) 2 g in sodium chloride  0.9 % 100 mL IVPB  Status:  Discontinued        2 g 200 mL/hr over 30 Minutes Intravenous Every 24 hours 06/30/24 2046 07/08/24 1632   07/01/24 2200  azithromycin  (ZITHROMAX ) 500 mg in sodium chloride  0.9 % 250 mL IVPB  Status:  Discontinued        500 mg 250 mL/hr over 60 Minutes Intravenous Every 24 hours 06/30/24 2046 07/05/24 1431   06/30/24 2030  azithromycin  (ZITHROMAX ) 500 mg in sodium chloride  0.9 % 250 mL IVPB        500 mg 250 mL/hr over 60 Minutes Intravenous  Once 06/30/24 2024 06/30/24 2205   06/30/24 2030  cefTRIAXone  (ROCEPHIN ) 2 g in sodium chloride  0.9 % 100 mL IVPB        2 g 200 mL/hr over 30 Minutes Intravenous  Once 06/30/24 2024 06/30/24 2102       Data Reviewed: I have personally reviewed following labs and imaging studies CBC: Recent Labs  Lab 07/03/24 0531 07/05/24 0447 07/06/24 1046 07/08/24 0455  WBC 16.1* 9.9 11.9* 14.1*  NEUTROABS  --   --  9.8*  --   HGB 9.5* 8.6* 8.9* 9.8*  HCT 29.6* 27.9* 29.2* 31.6*  MCV 92.2 94.6 96.4 94.3  PLT 187 158 141* 133*   Basic Metabolic Panel: Recent Labs  Lab 07/04/24 0434 07/06/24 1046  NA 144 139  K 4.2 3.8  CL 103 102  CO2 28 30  GLUCOSE 160* 199*  BUN 63* 48*  CREATININE 1.41* 1.12*  CALCIUM  9.0 8.7*   GFR: CrCl cannot be calculated (Unknown ideal weight.). Liver Function Tests: Recent Labs  Lab 07/06/24 1046  AST 38  ALT 26  ALKPHOS 132*  BILITOT 0.6  PROT 4.8*  ALBUMIN  2.4*   CBG: Recent Labs  Lab 07/03/24 2053  GLUCAP 219*    Recent Results (from the past 240 hours)  Culture, blood (routine x 2)     Status: None   Collection Time: 06/30/24  6:50 PM   Specimen: BLOOD  Result Value Ref Range  Status   Specimen Description BLOOD RIGHT ANTECUBITAL  Final   Special Requests   Final    BOTTLES DRAWN AEROBIC AND ANAEROBIC Blood Culture adequate volume   Culture   Final    NO GROWTH 5 DAYS Performed at [facility], [address] [place], [place] [address]    Report Status 07/05/2024 FINAL  Final  Resp panel by RT-PCR (RSV, Flu A&B, Covid) Anterior Nasal Swab     Status: None   Collection Time: 06/30/24  6:55 PM   Specimen: Anterior Nasal Swab  Result Value Ref Range Status   SARS Coronavirus 2 by RT PCR NEGATIVE NEGATIVE Final    Comment: (NOTE) SARS-CoV-2 target nucleic acids are NOT DETECTED.  The SARS-CoV-2 RNA is generally detectable in upper respiratory specimens during the acute phase of infection. The lowest concentration of SARS-CoV-2 viral copies this assay can detect is 138 copies/mL. A negative result does not preclude SARS-Cov-2 infection and should not be used as the sole basis for treatment or other patient management decisions. A negative result may occur with  improper specimen collection/handling, submission of specimen other than nasopharyngeal swab, presence of viral mutation(s) within the areas targeted by this assay, and inadequate number of viral copies(<138 copies/mL). A negative result must be combined with clinical observations, patient history, and epidemiological information. The expected result is Negative.  Fact Sheet for Patients:  BloggerCourse.com  Fact Sheet for Healthcare Providers:  SeriousBroker.it  This test is no t yet approved or cleared by the [place]  FDA and  has been authorized for detection and/or diagnosis of SARS-CoV-2 by FDA under an Emergency Use Authorization (EUA). This EUA will remain  in effect (meaning this test can be used) for the duration of the COVID-19 declaration under Section 564(b)(1) of the Act, 21 U.S.C.section 360bbb-3(b)(1), unless the  authorization is terminated  or revoked sooner.       Influenza A by PCR NEGATIVE NEGATIVE Final   Influenza B by PCR NEGATIVE NEGATIVE Final    Comment: (NOTE) The Xpert Xpress SARS-CoV-2/FLU/RSV plus assay is intended as an aid in the diagnosis of influenza from Nasopharyngeal swab specimens and should not be used as a sole basis for treatment. Nasal washings and aspirates are unacceptable for Xpert Xpress SARS-CoV-2/FLU/RSV testing.  Fact Sheet for Patients: BloggerCourse.com  Fact Sheet for Healthcare Providers: SeriousBroker.it  This test is not yet approved or cleared by the [place]  FDA and has been authorized for detection and/or diagnosis of SARS-CoV-2 by FDA under an Emergency Use Authorization (EUA). This EUA will  remain in effect (meaning this test can be used) for the duration of the COVID-19 declaration under Section 564(b)(1) of the Act, 21 U.S.C. section 360bbb-3(b)(1), unless the authorization is terminated or revoked.     Resp Syncytial Virus by PCR NEGATIVE NEGATIVE Final    Comment: (NOTE) Fact Sheet for Patients: BloggerCourse.com  Fact Sheet for Healthcare Providers: SeriousBroker.it  This test is not yet approved or cleared by the [place]  FDA and has been authorized for detection and/or diagnosis of SARS-CoV-2 by FDA under an Emergency Use Authorization (EUA). This EUA will remain in effect (meaning this test can be used) for the duration of the COVID-19 declaration under Section 564(b)(1) of the Act, 21 U.S.C. section 360bbb-3(b)(1), unless the authorization is terminated or revoked.  Performed at [facility], [address] [place], [place] [address]   Culture, blood (routine x 2)     Status: None   Collection Time: 06/30/24  6:55 PM   Specimen: BLOOD LEFT HAND  Result Value Ref Range Status   Specimen Description BLOOD  LEFT HAND  Final   Special Requests   Final    BOTTLES DRAWN AEROBIC AND ANAEROBIC Blood Culture adequate volume   Culture   Final    NO GROWTH 5 DAYS Performed at [facility], [address] [place], [place] [address]    Report Status 07/05/2024 FINAL  Final  Respiratory (~20 pathogens) panel by PCR     Status: None   Collection Time: 06/30/24  6:55 PM   Specimen: Nasopharyngeal Swab; Respiratory  Result Value Ref Range Status   Adenovirus NOT DETECTED NOT DETECTED Final   Coronavirus 229E NOT DETECTED NOT DETECTED Final    Comment: (NOTE) The Coronavirus on the Respiratory Panel, DOES NOT test for the novel  Coronavirus (2019 nCoV)    Coronavirus HKU1 NOT DETECTED NOT DETECTED Final   Coronavirus NL63 NOT DETECTED NOT DETECTED Final   Coronavirus OC43 NOT DETECTED NOT DETECTED Final   Metapneumovirus NOT DETECTED NOT DETECTED Final   Rhinovirus / Enterovirus NOT DETECTED NOT DETECTED Final   Influenza A NOT DETECTED NOT DETECTED Final   Influenza B NOT DETECTED NOT DETECTED Final   Parainfluenza Virus 1 NOT DETECTED NOT DETECTED Final   Parainfluenza Virus 2 NOT DETECTED NOT DETECTED Final   Parainfluenza Virus 3 NOT DETECTED NOT DETECTED Final   Parainfluenza Virus 4 NOT DETECTED NOT DETECTED Final   Respiratory Syncytial Virus NOT DETECTED NOT DETECTED Final   Bordetella pertussis NOT DETECTED NOT DETECTED Final   Bordetella Parapertussis NOT DETECTED NOT DETECTED Final   Chlamydophila pneumoniae NOT DETECTED NOT DETECTED Final   Mycoplasma pneumoniae NOT DETECTED NOT DETECTED Final    Comment: Performed at [facility], [address], [place], [place] [address]  MRSA Next Gen by PCR, Nasal     Status: None   Collection Time: 07/01/24  3:48 AM   Specimen: Nasal Mucosa; Nasal Swab  Result Value Ref Range Status   MRSA by PCR Next Gen NOT DETECTED NOT DETECTED Final    Comment: (NOTE) The GeneXpert MRSA Assay (FDA approved for NASAL specimens only), is  one component of a comprehensive MRSA colonization surveillance program. It is not intended to diagnose MRSA infection nor to guide or monitor treatment for MRSA infections. Test performance is not FDA approved in patients less than 31 years old. Performed at [facility], [address] [place], [place] [address]      Radiology Studies: No results  found.   Scheduled Meds:  lidocaine   1 patch Transdermal Q24H   Continuous Infusions:  morphine  3 mg/hr (08/05/2024 1451)     LOS: 9 days  MDM: Patient is high risk for one or more organ failure.  They necessitate ongoing hospitalization for continued IV therapies and subsequent lab monitoring. Total time spent interpreting labs and vitals, reviewing the medical record, coordinating care amongst consultants and care team members, directly assessing and discussing care with the patient and/or family: 55 min  Blondell Laperle, DO [facility]  To contact the attending physician between 7A-7P please use Epic Chat. To contact the covering physician during after hours 7P-7A, please review Amion.  05-Aug-2024, 3:31 PM   *This document has been created with the assistance of dictation software. Please excuse typographical errors. *

## 2024-07-29 NOTE — Plan of Care (Signed)
  Problem: Education: Goal: Knowledge of General Education information will improve Description: Including pain rating scale, medication(s)/side effects and non-pharmacologic comfort measures Outcome: Progressing   Problem: Health Behavior/Discharge Planning: Goal: Ability to manage health-related needs will improve Outcome: Progressing   Problem: Clinical Measurements: Goal: Respiratory complications will improve Outcome: Progressing   Problem: Activity: Goal: Risk for activity intolerance will decrease Outcome: Progressing   Problem: Nutrition: Goal: Adequate nutrition will be maintained Outcome: Progressing   Problem: Elimination: Goal: Will not experience complications related to bowel motility Outcome: Progressing   Problem: Pain Managment: Goal: General experience of comfort will improve and/or be controlled Outcome: Progressing

## 2024-07-29 NOTE — Progress Notes (Signed)
 Palliative Care Progress Note, Assessment & Plan   Patient Name: Julie Mcbride       Date: 07-31-2024 DOB: November 03, 1936  Age: 88 y.o. MRN#: [id number] Attending Physician: Dezii, Alexandra, DO Primary Care Physician: Alla Amis, MD Admit Date: 06/30/2024  Subjective: Patient is sitting up in bed with nasal cannula in place.  She acknowledges my presence and is able to make her wishes known.  Her daughter [name] and Trine husband are at bedside during my visit.  Additionally, during our discussion, patient's husband Sam joined our discussion.  HPI: 88 y.o. 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    Of note, patient is familiar to our service that she met with my colleague Josh yesterday, 8/4.   Patient is being treated for acute on chronic respiratory failure with hypoxia and hypercapnia, sepsis due to multifocal pneumonia, elevated troponin, bilateral pleural effusions, recent saddle PE s/p thrombectomy with right femoral DVT, stage IV lung cancer with mets to liver and Jones, CKD stage IIIa, right anterior stenosis s/p stent, HTN, generalized weakness with debility.   PMT was consulted to support patient and family with goals of care discussions given patient's widespread metastatic disease.   Summary of counseling/coordination of care: Extensive chart review completed prior to meeting patient including labs, vital signs, imaging, progress notes, orders, and available advanced  directive documents from current and previous encounters.   After reviewing the patient's chart and assessing the patient at bedside, I spoke with patient in regards to symptom management and goals of care.   Symptoms assessed.  Patient is having significant conversational dyspnea.  She endorses feeling as though she cannot take a large breath.  She is having a cough with very little productive sputum.  She shares she hurts all over.  She shares she is ready to be with Jesus.  Therapeutic silence, active listening, and emotional support provided.  Discussed shifting to comfort focused care.  Discussed with patient and family that despite maximizing medical efforts, patient is continuing to suffer with current regimen.  I strongly advised that we would shift to strictly comfort focused care.  Comfort measures discussed in detail.  Bruna would no longer receive aggressive medical interventions such as continuous vital signs, lab work, radiology testing, or medications not focused on comfort. All care would focus on how she is looking and feeling. This would include management of any symptoms that may cause discomfort, pain, shortness of breath, cough, nausea, agitation, anxiety, and/or secretions etc. Symptoms would be managed with medications and other non-pharmacological interventions such as spiritual support if requested, repositioning, music therapy, or therapeutic listening.   Family verbalized understanding and appreciation.  Patient shares that this is what she wants.  Daughter, son-in-law, and husband at bedside are in agreement.  Discussed use of morphine  for pain as well as air hunger.  Education provided on air hunger, shortness of breath, and use of narcotics for work of breathing.  Patient and family shared understanding.  Patient endorsed that when she got.  She felt much better.  Discussed that morphine  as well as other medications will continue to be given as needed to ensure she does not  experience pain and will minimize suffering.  After meeting with patient and family, I counseled with attending, TOC, and RN.  Notified team that plan of care has shifted to full comfort measures.  Adjustment made to [facility].  I rounded a few hours later.  Patient appears uncomfortable despite recent dose of morphine  and Haldol .  Discussed with RN and shifted patient to morphine  gtt.  Attending had already placed orders and I adjusted them to liberate ability to give boluses.  Comfort measures to continue.  PMT will continue to follow and support.  Physical Exam HENT:     Head: Normocephalic.     Mouth/Throat:     Mouth: Mucous membranes are moist.  Eyes:     Pupils: Pupils are equal, round, and reactive to light.  Cardiovascular:     Rate and Rhythm: Normal rate.  Pulmonary:     Breath sounds: No wheezing.     Comments: SOB Abdominal:     Palpations: Abdomen is soft.  Skin:    General: Skin is warm and dry.     Coloration: Skin is pale.  Neurological:     Mental Status: She is alert and oriented to person, place, and time.  Psychiatric:        Mood and Affect: Mood normal.        Behavior: Behavior normal.        Thought Content: Thought content normal.        Judgment: Judgment normal.             Total Time 50 minutes   Time spent includes: Detailed review of medical records (labs, imaging, vital signs), medically appropriate exam (mental status, respiratory, cardiac, skin), discussed with treatment team, counseling and educating patient, family and staff, documenting clinical information, medication management and coordination of care.  Lamarr L. Arvid, DNP, FNP-BC Palliative Medicine Team

## 2024-07-29 NOTE — Discharge Summary (Signed)
 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  Patient O2 was found to be 85% at SNF.  Patient was recently discharged 06/19/2024 for pulmonary embolism.  She reports she was getting stronger at rehab with plan to discharge home the next weekend.  Unfortunately patient's respiratory status declined and she required readmission.  Acute respiratory failure during this admission is multifactorial secondary to pneumonia, pleural effusion, and widespread malignancy.  She underwent thoracentesis with removal of 1.4 L of fluid and had some improvement, but ultimately respiratory status began to decline again.  Patient became increasingly agitated, anxious, hypoxic, and was endorsing diffuse pain.  After extensive family discussions through her stay they ultimately decided to transition to hospice and proceed with comfort measures only in the hospital. On 07/22/2024 at 1902, the patient was declared deceased.    Discharge Instructions   Allergies as of 07-22-2024       Reactions   Chlorhexidine  Hives   SAGE wipes caused severe rash   Cyclobenzaprine Hcl    Doesn't remember reaction   Etodolac    Doesn't remember reaction   Lisinopril    Coughing up blood   Meloxicam    Doesn't remember reaction   Metaxalone    Doesn't remember reaction   Neomycin-bacitracin Zn-polymyx    Skin irritation   Nifedipine Swelling   Nitrofurantoin    Doesn't remember reaction    Sulfamethoxazole-trimethoprim Hives   Sulfonamide Derivatives Hives   Tizanidine    Liver problems   Vasotec [enalapril Maleate]    Doesn't remember reaction   Ibuprofen Rash   Tape Rash        Medication List     ASK your doctor about these medications    ACETAMINOPHEN  PO Take 650 mg by mouth every 8 (eight) hours.   acetaminophen  500 MG tablet Commonly known as: TYLENOL  Take 1,000 mg by mouth every 8 (eight) hours as needed.   Acidophilus Lactobacillus Caps Take 1 capsule by mouth daily.   ALPRAZolam  0.25 MG tablet Commonly known as: XANAX  Take 0.25 mg by mouth 3 (three) times daily as needed for anxiety.   atorvastatin  10 MG tablet Commonly known as: Lipitor Take 1 tablet (10 mg total) by mouth daily.   cetirizine 10 MG tablet Commonly known as: ZYRTEC Take 10 mg by mouth as needed.   doxycycline 100 MG capsule Commonly known as: VIBRAMYCIN Take 100 mg by mouth 2 (two) times daily.   fluticasone  50 MCG/ACT nasal spray Commonly known as: FLONASE  Place 2 sprays into both nostrils as needed for allergies or rhinitis.   furosemide  20 MG tablet Commonly known as: LASIX  Take 20 mg by mouth daily.   lidocaine  4 % Place 1 patch onto the skin daily. REMOVE & DISCARD PATCH WITHIN 12 HOURS OR AS DIRECTED BY MD   polyethylene glycol 17 g packet Commonly known as: MIRALAX  / GLYCOLAX  Take 17 g by mouth 2 (two) times daily.   rOPINIRole  0.25 MG tablet Commonly known as: REQUIP  Take 0.5 mg by mouth 3 (three) times daily.  senna-docusate 8.6-50 MG tablet Commonly known as: Senokot-S Take 1 tablet by mouth 2 (two) times daily.   triamcinolone  ointment 0.1 % Commonly known as: KENALOG  Apply topically 2 (two) times daily as needed.   Vitamin D  50 MCG (2000 UT) tablet Take 4,000 Units by mouth daily.        Follow-up Information     Florencio Cara BIRCH, MD. Go in 1 week(s).   Specialties: Cardiology, Internal Medicine Contact information: [address] [place] [address] [phone]                Allergies  Allergen Reactions   Chlorhexidine  Hives    SAGE wipes caused severe rash   Cyclobenzaprine Hcl     Doesn't remember reaction   Etodolac     Doesn't remember reaction   Lisinopril     Coughing up blood   Meloxicam     Doesn't remember reaction   Metaxalone     Doesn't remember reaction   Neomycin-Bacitracin Zn-Polymyx     Skin irritation   Nifedipine Swelling   Nitrofurantoin     Doesn't remember reaction   Sulfamethoxazole-Trimethoprim Hives   Sulfonamide Derivatives Hives   Tizanidine     Liver problems   Vasotec [Enalapril Maleate]     Doesn't remember reaction   Ibuprofen Rash   Tape Rash    Consultations: Treatment Team:  Aleskerov, Fuad, MD   Procedures/Studies: DG Chest Port 1 View Result Date: 07/03/2024 CLINICAL DATA:  Status post thoracentesis. EXAM: PORTABLE CHEST 1 VIEW COMPARISON:  07/02/2024 and CT chest 06/11/2024. FINDINGS: Patient's head obscures the superior mediastinum and medial apices. Heart is enlarged, stable. Bibasilar collapse/consolidation with bilateral pleural effusions, minimally decreased on the right, status post thoracentesis. No definite pneumothorax. IMPRESSION: 1. No definite pneumothorax status post right thoracentesis. 2. Bibasilar collapse/consolidation with moderate bilateral pleural effusions, minimally decreased on the right. 3. Pulmonary metastatic disease better seen on CT chest 06/11/2024. Electronically Signed   By: Newell Eke M.D.   On: 07/03/2024 14:01   US  THORACENTESIS ASP PLEURAL SPACE W/IMG GUIDE Result Date: 07/03/2024 INDICATION: 88 year old female with history of stage IVb lung cancer, with new onset right-sided pleural effusion. IR was requested for diagnostic and therapeutic thoracentesis. EXAM: ULTRASOUND GUIDED DIAGNOSTIC AND THERAPEUTIC THORACENTESIS MEDICATIONS: 6 cc of 1% lidocaine  COMPLICATIONS: None immediate. PROCEDURE: An  ultrasound guided thoracentesis was thoroughly discussed with the patient and questions answered. The benefits, risks, alternatives and complications were also discussed. The patient understands and wishes to proceed with the procedure. Written consent was obtained. Ultrasound was performed to localize and mark an adequate pocket of fluid in the right chest. The area was then prepped and draped in the normal sterile fashion. 1% Lidocaine  was used for local anesthesia. Under ultrasound guidance a 6 Fr Safe-T-Centesis catheter was introduced. Thoracentesis was performed. The catheter was removed and a dressing applied. FINDINGS: A total of approximately 1.4 L of clear, dark amber versus blood-tinged pleural fluid was removed. Samples were sent to the laboratory as requested by the clinical team. IMPRESSION: Successful ultrasound guided right thoracentesis yielding 1.4 L of pleural fluid. Procedure performed by Carlin Griffon, PA-C Electronically Signed   By: CHRISTELLA.  Shick M.D.   On: 07/03/2024 13:06   ECHOCARDIOGRAM LIMITED Result Date: 07/03/2024    ECHOCARDIOGRAM LIMITED REPORT   Patient Name:   Julie Mcbride Date of Exam: 07/02/2024 Medical Rec #:  [id number]        Height:  66.0 in Accession #:    [id number]       Weight:       216.0 lb Date of Birth:  [birth year]       BSA:          2.067 m Patient Age:    87 years         BP:           124/56 mmHg Patient Gender: F                HR:           80 bpm. Exam Location:  [facility] Procedure: Limited Echo, Cardiac Doppler and Color Doppler (Both Spectral and            Color Flow Doppler were utilized during procedure). Indications:     Dyspnea  History:         Patient has prior history of Echocardiogram examinations, most                  recent 06/11/2024. Signs/Symptoms:Dyspnea and Shortness of                  Breath; Risk Factors:Hypertension.  Sonographer:     Philomena Daring Referring Phys:  [id number] DORENE DECOSTE Diagnosing Phys: Dwayne D Callwood MD IMPRESSIONS   1. Large pleural effusion no significant pericardial effusion.  2. Limited ECHO images.  3. Left ventricular ejection fraction, by estimation, is 65 to 70%. The left ventricle has normal function. Left ventricular diastolic parameters are consistent with Grade I diastolic dysfunction (impaired relaxation). There is the interventricular septum is flattened in diastole ('D' shaped left ventricle), consistent with right ventricular volume overload.  4. Right ventricular systolic function is normal. The right ventricular size is moderately enlarged. Mildly increased right ventricular wall thickness. There is moderately elevated pulmonary artery systolic pressure.  5. Large pleural effusion in the left lateral region.  6. The mitral valve is normal in structure. No evidence of mitral valve regurgitation.  7. The aortic valve was not well visualized. Aortic valve regurgitation is not visualized. Conclusion(s)/Recommendation(s): Poor windows for evaluation of left ventricular function by transthoracic echocardiography. Would recommend an alternative means of evaluation. FINDINGS  Left Ventricle: Left ventricular ejection fraction, by estimation, is 65 to 70%. The left ventricle has normal function. The left ventricular internal cavity size was normal in size. There is no concentric left ventricular hypertrophy. The interventricular septum is flattened in diastole ('D' shaped left ventricle), consistent with right ventricular volume overload. Left ventricular diastolic parameters are consistent with Grade I diastolic dysfunction (impaired relaxation). Right Ventricle: The right ventricular size is moderately enlarged. Mildly increased right ventricular wall thickness. Right ventricular systolic function is normal. There is moderately elevated pulmonary artery systolic pressure. Left Atrium: Left atrial size was normal in size. Right Atrium: Right atrial size was normal in size. Pericardium: There is no evidence of  pericardial effusion. Mitral Valve: The mitral valve is normal in structure. Tricuspid Valve: The tricuspid valve is not well visualized. Tricuspid valve regurgitation is not demonstrated. Aortic Valve: The aortic valve was not well visualized. Aortic valve regurgitation is not visualized. Pulmonic Valve: The pulmonic valve was not well visualized. Aorta: The ascending aorta was not well visualized. Additional Comments: Limited ECHO images. Large pleural effusion no significant pericardial effusion. There is a large pleural effusion in the left lateral region. Spectral Doppler performed. Color Doppler performed.  LEFT VENTRICLE PLAX 2D LVIDd:  3.90 cm   Diastology LVIDs:         2.50 cm   LV e' medial: 3.70 cm/s LV PW:         1.00 cm LV IVS:        1.00 cm LVOT diam:     1.90 cm LV SV:         90 LV SV Index:   43 LVOT Area:     2.84 cm  RIGHT VENTRICLE            IVC RV S prime:     9.79 cm/s  IVC diam: 1.90 cm TAPSE (M-mode): 1.8 cm LEFT ATRIUM         Index LA diam:    3.00 cm 1.45 cm/m  AORTIC VALVE LVOT Vmax:   151.00 cm/s LVOT Vmean:  104.000 cm/s LVOT VTI:    0.316 m  SHUNTS Systemic VTI:  0.32 m Systemic Diam: 1.90 cm Cara JONETTA Lovelace MD Electronically signed by Cara JONETTA Lovelace MD Signature Date/Time: 07/03/2024/7:33:59 AM    Final    NM Pulmonary Perfusion Result Date: 07/02/2024 CLINICAL DATA:  Pulmonary embolism, high probability EXAM: NUCLEAR MEDICINE PERFUSION LUNG SCAN TECHNIQUE: Perfusion images were obtained in multiple projections after intravenous injection of radiopharmaceutical. Ventilation scans intentionally deferred if perfusion scan and chest x-ray adequate for interpretation during COVID 19 epidemic. RADIOPHARMACEUTICALS:  4.33 mCi Tc-16m MAA IV COMPARISON:  None. Findings are correlated with prior chest radiograph of 07/02/2024 FINDINGS: Moderate right and small left pleural effusions are noted on prior chest radiograph. Defects are noted involving the posterior basal aspects  of the lungs bilaterally related to these pleural effusions. Linear defect within the lungs bilaterally likely reflect fluid within the fissures. Small subsegmental defect within the right apex is nonspecific. Altogether, the findings do not support the presence of underlying acute pulmonary embolus. IMPRESSION: No pulmonary embolism. Multiple defects related to bilateral pleural effusions, right greater than left. Electronically Signed   By: Dorethia Molt M.D.   On: 07/02/2024 16:09   DG Chest Port 1 View Result Date: 07/02/2024 CLINICAL DATA:  Suspected pulmonary embolism. EXAM: PORTABLE CHEST - 1 VIEW COMPARISON:  06/30/2024. FINDINGS: Unchanged appearance of the cardiomediastinal silhouette. Unchanged mild pulmonary vascular congestion. Known diffuse metastatic disease of the lungs are better seen on recent chest CT from 06/11/2024. There is unchanged large RIGHT and small LEFT pleural effusions with adjacent airspace opacity. IMPRESSION: No significant interval change in appearance of the lungs with large RIGHT and small LEFT pleural effusions with adjacent atelectasis/pneumonia. Electronically Signed   By: Aliene Lloyd M.D.   On: 07/02/2024 14:34   DG Chest Port 1 View Result Date: 06/30/2024 CLINICAL DATA:  Shortness of breath. EXAM: PORTABLE CHEST 1 VIEW COMPARISON:  06/14/2024. FINDINGS: The heart size and mediastinal contours are unchanged. Moderate right and small to moderate left pleural effusions, increased since the prior exam with associated bibasilar atelectasis. Increased bilateral multifocal interstitial opacities. No pneumothorax. Cervical fixation hardware. No acute osseous abnormality. IMPRESSION: 1. Moderate right and small to moderate left pleural effusions, increased since the prior exam with associated bibasilar atelectasis. 2. Increased bilateral multifocal interstitial opacities could reflect pulmonary edema or multifocal infection. Electronically Signed   By: Harrietta Sherry M.D.    On: 06/30/2024 19:34   MR BRAIN W WO CONTRAST Result Date: 06/16/2024 EXAM: MRI BRAIN WITH AND WITHOUT CONTRAST 06/16/2024 06:30:00 PM TECHNIQUE: Multiplanar multisequence MRI of the head/brain was performed with and without the administration of intravenous  contrast. COMPARISON: 06/07/2023 CLINICAL HISTORY: Cancer staging. Lung CA. Eval for mets. FINDINGS: BRAIN AND VENTRICLES: No acute infarct. No acute intracranial hemorrhage. No mass effect or midline shift. No hydrocephalus. The sella is unremarkable. Normal flow voids. No mass or abnormal enhancement. Multifocal hyperintense T2-weighted signal within the cerebral white matter, most commonly due to chronic small vessel disease. Generalized volume loss. 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 ORBITS: No acute abnormality. SINUSES: No acute abnormality. BONES AND SOFT TISSUES: Normal bone marrow signal and enhancement. No acute soft tissue abnormality. IMPRESSION: 1. No acute intracranial abnormality. 2. Multifocal hyperintense T2-weighted signal within the cerebral white matter, most commonly due to chronic small vessel disease. 3. Generalized volume loss. 4. 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 Electronically signed by: Franky Stanford MD 06/16/2024 08:35 PM EDT RP Workstation: [id number]   DG Chest Port 1 View Result Date: 06/14/2024 CLINICAL DATA:  Cough and breathing difficulty EXAM: PORTABLE CHEST 1 VIEW COMPARISON:  Chest radiograph dated 06/10/2024 FINDINGS: Low lung volumes with bronchovascular crowding. Increased patchy bibasilar opacities and mild interstitial opacities. Similar trace bilateral pleural effusions. No pneumothorax. Similar mildly enlarged cardiomediastinal silhouette. Cervical spinal fixation hardware appears intact. IMPRESSION: 1. Increased patchy bibasilar opacities and mild interstitial opacities, which may represent pulmonary edema or multifocal infection. 2. Similar trace bilateral pleural effusions.  Electronically Signed   By: Limin  Xu M.D.   On: 06/14/2024 13:41   DG HIP UNILAT WITH PELVIS 2-3 VIEWS RIGHT Result Date: 06/14/2024 CLINICAL DATA:  [id number] Hip pain [id number] EXAM: DG HIP (WITH OR WITHOUT PELVIS) 2-3V RIGHT COMPARISON:  March 09, 2023 FINDINGS: No evidence of pelvic fracture or diastasis.No acute hip fracture or dislocation.Moderate joint space loss of the left hip. Degenerative disc disease of the visualized lumbar spine.Soft tissues are unremarkable. IMPRESSION: 1. No acute fracture, pelvic bone diastasis, or dislocation. 2. Moderate osteoarthritis of the left hip. Electronically Signed   By: Rogelia Myers M.D.   On: 06/14/2024 13:20   ECHOCARDIOGRAM COMPLETE Result Date: 06/11/2024    ECHOCARDIOGRAM REPORT   Patient Name:   Julie Mcbride Date of Exam: 06/11/2024 Medical Rec #:  [id number]        Height:       65.0 in Accession #:    [id number]       Weight:       210.9 lb Date of Birth:  [birth year]       BSA:          2.023 m Patient Age:    87 years         BP:           113/66 mmHg Patient Gender: F                HR:           91 bpm. Exam Location:  [facility] Procedure: 2D Echo, Cardiac Doppler and Color Doppler (Both Spectral and Color            Flow Doppler were utilized during procedure). Indications:     pulmonary embolus I26.09  History:         Patient has prior history of Echocardiogram examinations, most                  recent 12/26/2020. Previous Myocardial Infarction,                  Signs/Symptoms:Dyspnea; Risk Factors:Hypertension.  Sonographer:     Christopher Furnace Referring Phys:  [phone]  JAN A MANSY Diagnosing Phys: Lonni Hanson MD IMPRESSIONS  1. Left ventricular ejection fraction, by estimation, is 65 to 70%. The left ventricle has normal function. The left ventricle has no regional wall motion abnormalities. There is moderate left ventricular hypertrophy. Left ventricular diastolic parameters are indeterminate. There is the interventricular septum is flattened in diastole  ('D' shaped left ventricle), consistent with right ventricular volume overload.  2. Pulmonary artery pressure is mildly elevated (RVSP 35 mmHg plus central venous/right atrial pressure). Right ventricular systolic function is moderately reduced. The right ventricular size is moderately enlarged.  3. The mitral valve is degenerative. Trivial mitral valve regurgitation. No evidence of mitral stenosis.  4. The aortic valve is tricuspid. Aortic valve regurgitation is not visualized. No aortic stenosis is present. FINDINGS  Left Ventricle: Left ventricular ejection fraction, by estimation, is 65 to 70%. The left ventricle has normal function. The left ventricle has no regional wall motion abnormalities. The left ventricular internal cavity size was normal in size. There is  moderate left ventricular hypertrophy. The interventricular septum is flattened in diastole ('D' shaped left ventricle), consistent with right ventricular volume overload. Left ventricular diastolic parameters are indeterminate. Right Ventricle: Pulmonary artery pressure is mildly elevated (RVSP 35 mmHg plus central venous/right atrial pressure). The right ventricular size is moderately enlarged. No increase in right ventricular wall thickness. Right ventricular systolic function is moderately reduced. Left Atrium: Left atrial size was normal in size. Right Atrium: Right atrial size was normal in size. Pericardium: There is no evidence of pericardial effusion. Mitral Valve: The mitral valve is degenerative in appearance. There is mild thickening of the mitral valve leaflet(s). Mild mitral annular calcification. Trivial mitral valve regurgitation. No evidence of mitral valve stenosis. Tricuspid Valve: The tricuspid valve is normal in structure. Tricuspid valve regurgitation is mild. Aortic Valve: The aortic valve is tricuspid. Aortic valve regurgitation is not visualized. No aortic stenosis is present. Aortic valve mean gradient measures 3.0 mmHg.  Aortic valve peak gradient measures 5.2 mmHg. Aortic valve area, by VTI measures 2.53 cm. Pulmonic Valve: The pulmonic valve was not well visualized. Pulmonic valve regurgitation is not visualized. No evidence of pulmonic stenosis. Aorta: The aortic root is normal in size and structure. Pulmonary Artery: The pulmonary artery is not well seen. Venous: The inferior vena cava was not well visualized. IAS/Shunts: The interatrial septum was not well visualized.  LEFT VENTRICLE PLAX 2D LVIDd:         3.30 cm   Diastology LVIDs:         2.20 cm   LV e' medial:    4.79 cm/s LV PW:         1.20 cm   LV E/e' medial:  15.6 LV IVS:        1.38 cm   LV e' lateral:   11.50 cm/s LVOT diam:     2.00 cm   LV E/e' lateral: 6.5 LV SV:         44 LV SV Index:   22 LVOT Area:     3.14 cm  RIGHT VENTRICLE RV Basal diam:  4.00 cm RV Mid diam:    4.00 cm LEFT ATRIUM             Index        RIGHT ATRIUM           Index LA diam:        3.00 cm 1.48 cm/m   RA Area:     17.90 cm  LA Vol (A2C):   42.0 ml 20.76 ml/m  RA Volume:   51.30 ml  25.36 ml/m LA Vol (A4C):   18.7 ml 9.24 ml/m LA Biplane Vol: 29.2 ml 14.43 ml/m  AORTIC VALVE AV Area (Vmax):    2.51 cm AV Area (Vmean):   2.20 cm AV Area (VTI):     2.53 cm AV Vmax:           114.00 cm/s AV Vmean:          88.200 cm/s AV VTI:            0.174 m AV Peak Grad:      5.2 mmHg AV Mean Grad:      3.0 mmHg LVOT Vmax:         91.00 cm/s LVOT Vmean:        61.700 cm/s LVOT VTI:          0.140 m LVOT/AV VTI ratio: 0.80  AORTA Ao Root diam: 3.20 cm MITRAL VALVE                TRICUSPID VALVE MV Area (PHT): 5.97 cm     TR Peak grad:   34.8 mmHg MV Decel Time: 127 msec     TR Vmax:        295.00 cm/s MV E velocity: 74.90 cm/s MV A velocity: 125.00 cm/s  SHUNTS MV E/A ratio:  0.60         Systemic VTI:  0.14 m                             Systemic Diam: 2.00 cm Lonni Hanson MD Electronically signed by Lonni Hanson MD Signature Date/Time: 06/11/2024/3:14:06 PM    Final    PERIPHERAL  VASCULAR CATHETERIZATION Result Date: 06/11/2024 See surgical note for result.  US  Venous Img Lower Bilateral (DVT) Result Date: 06/11/2024 CLINICAL DATA:  Patient with pulmonary embolism.  Evaluate for DVT. EXAM: BILATERAL LOWER EXTREMITY VENOUS DOPPLER ULTRASOUND TECHNIQUE: Gray-scale sonography with graded compression, as well as color Doppler and duplex ultrasound were performed to evaluate the lower extremity deep venous systems from the level of the common femoral vein and including the common femoral, femoral, profunda femoral, popliteal and calf veins including the posterior tibial, peroneal and gastrocnemius veins when visible. The superficial great saphenous vein was also interrogated. Spectral Doppler was utilized to evaluate flow at rest and with distal augmentation maneuvers in the common femoral, femoral and popliteal veins. COMPARISON:  None Available. FINDINGS: RIGHT LOWER EXTREMITY Common Femoral Vein: No evidence of thrombus. Normal compressibility, respiratory phasicity and response to augmentation. Saphenofemoral Junction: No evidence of thrombus. Normal compressibility and flow on color Doppler imaging. Profunda Femoral Vein: No evidence of thrombus. Normal compressibility and flow on color Doppler imaging. Femoral Vein: Examination is positive for nonocclusive thrombus within the proximal right femoral vein. Popliteal Vein: No evidence of thrombus. Normal compressibility, respiratory phasicity and response to augmentation. Calf Veins: No evidence of thrombus. Normal compressibility and flow on color Doppler imaging. Superficial Great Saphenous Vein: No evidence of thrombus. Normal compressibility. Venous Reflux:  None. Other Findings:  None. LEFT LOWER EXTREMITY Common Femoral Vein: No evidence of thrombus. Normal compressibility, respiratory phasicity and response to augmentation. Saphenofemoral Junction: No evidence of thrombus. Normal compressibility and flow on color Doppler imaging.  Profunda Femoral Vein: No evidence of thrombus. Normal compressibility and flow on color Doppler imaging. Femoral Vein: No evidence of thrombus. Normal compressibility,  respiratory phasicity and response to augmentation. Popliteal Vein: No evidence of thrombus. Normal compressibility, respiratory phasicity and response to augmentation. Calf Veins: No evidence of thrombus. Normal compressibility and flow on color Doppler imaging. Superficial Great Saphenous Vein: No evidence of thrombus. Normal compressibility. Venous Reflux:  None. Other Findings:  None. IMPRESSION: 1. Examination is positive for nonocclusive thrombus within the proximal right femoral vein. 2. No evidence for left lower extremity deep venous thrombosis. Electronically Signed   By: Waddell Calk M.D.   On: 06/11/2024 06:39   CT Angio Chest PE W and/or Wo Contrast Result Date: 06/11/2024 CLINICAL DATA:  Shortness of breath and left-sided chest pain, known history of lung carcinoma EXAM: CT ANGIOGRAPHY CHEST WITH CONTRAST TECHNIQUE: Multidetector CT imaging of the chest was performed using the standard protocol during bolus administration of intravenous contrast. Multiplanar CT image reconstructions and MIPs were obtained to evaluate the vascular anatomy. RADIATION DOSE REDUCTION: This exam was performed according to the departmental dose-optimization program which includes automated exposure control, adjustment of the mA and/or kV according to patient size and/or use of iterative reconstruction technique. CONTRAST:  75mL OMNIPAQUE  IOHEXOL  350 MG/ML SOLN COMPARISON:  05/24/2024 FINDINGS: Cardiovascular: Atherosclerotic calcifications of the thoracic aorta are noted. No aneurysmal dilatation or dissection is seen. The pulmonary artery shows a normal branching pattern bilaterally. Extensive pulmonary emboli are noted right greater than left. There are changes consistent with right heart strain with an RV/LV ratio of 1.5. Mediastinum/Nodes: Thoracic  inlet is within normal limits. No hilar or mediastinal adenopathy is noted. The esophagus as visualized is within normal limits. Sliding-type hiatal hernia is seen. Lungs/Pleura: Small left effusion is noted. Scattered nodular densities are seen similar to that noted on prior exam consistent with metastatic disease. The largest index lesion in the left lower lobe measures approximately 17 mm in greatest dimension stable from the prior study. Similar findings are noted within the right lung although a large pleural effusion is noted increased when compared with the prior exam. Index lesion in the right lower lobe measures 17 mm also stable from the prior exam. Upper Abdomen: Visualized upper abdomen shows stable cyst in the left lobe of the liver. Musculoskeletal: Degenerative changes of the thoracic spine are noted. No compression deformity is noted. Review of the MIP images confirms the above findings. IMPRESSION: Extensive bilateral pulmonary emboli with evidence of right heart strain. The RV/LV ratio measures 1.5. Scattered pulmonary nodules again identified consistent with metastatic disease. The overall appearance is similar to that seen on the recent exam. Aortic Atherosclerosis (ICD10-I70.0). Critical Value/emergent results were called by telephone at the time of interpretation on 06/11/2024 at 1:24 am to Dr. KEVIN PADUCHOWSKI , who verbally acknowledged these results. Electronically Signed   By: Oneil Devonshire M.D.   On: 06/11/2024 01:26   DG Chest Port 1 View Result Date: 06/10/2024 CLINICAL DATA:  Shortness of breath EXAM: PORTABLE CHEST 1 VIEW COMPARISON:  Chest x-ray 05/24/2024.  CT of the chest 05/24/2024. FINDINGS: Heart is enlarged. There are small bilateral pleural effusions. There scattered bilateral patchy airspace opacities in both lungs increasing in the left upper lobe. Scattered nodular densities are grossly unchanged. No pneumothorax or acute fracture identified. IMPRESSION: 1. Scattered  bilateral patchy airspace opacities in both lungs increasing in the left upper lobe. Findings may represent edema or infection. 2. Small bilateral pleural effusions. 3. Stable scattered nodular densities. 4. Cardiomegaly. Electronically Signed   By: Greig Pique M.D.   On: 06/10/2024 23:14  Discharge Exam: Vitals:   Jul 17, 2024 0833 July 17, 2024 0840  BP: 110/72 110/66  Pulse: 66 75  Resp: 17 20  Temp: 98 F (36.7 C) 97.8 F (36.6 C)  SpO2: 96% 97%   Vitals:   07-17-2024 0358 07/17/24 0833 07/17/2024 0838 July 17, 2024 0840  BP: (!) 153/60 110/72  110/66  Pulse: 82 66  75  Resp: 20 17  20   Temp: 98.3 F (36.8 C) 98 F (36.7 C)  97.8 F (36.6 C)  TempSrc:      SpO2: 97% 96%  97%  Height:   5' 6 (1.676 m)     The results of significant diagnostics from this hospitalization (including imaging, microbiology, ancillary and laboratory) are listed below for reference.     Microbiology: Recent Results (from the past 240 hours)  Culture, blood (routine x 2)     Status: None   Collection Time: 06/30/24  6:50 PM   Specimen: BLOOD  Result Value Ref Range Status   Specimen Description BLOOD RIGHT ANTECUBITAL  Final   Special Requests   Final    BOTTLES DRAWN AEROBIC AND ANAEROBIC Blood Culture adequate volume   Culture   Final    NO GROWTH 5 DAYS Performed at [facility], [address] [place], [place] [address]    Report Status 07/05/2024 FINAL  Final  Resp panel by RT-PCR (RSV, Flu A&B, Covid) Anterior Nasal Swab     Status: None   Collection Time: 06/30/24  6:55 PM   Specimen: Anterior Nasal Swab  Result Value Ref Range Status   SARS Coronavirus 2 by RT PCR NEGATIVE NEGATIVE Final    Comment: (NOTE) SARS-CoV-2 target nucleic acids are NOT DETECTED.  The SARS-CoV-2 RNA is generally detectable in upper respiratory specimens during the acute phase of infection. The lowest concentration of SARS-CoV-2 viral copies this assay can detect is 138 copies/mL. A negative result  does not preclude SARS-Cov-2 infection and should not be used as the sole basis for treatment or other patient management decisions. A negative result may occur with  improper specimen collection/handling, submission of specimen other than nasopharyngeal swab, presence of viral mutation(s) within the areas targeted by this assay, and inadequate number of viral copies(<138 copies/mL). A negative result must be combined with clinical observations, patient history, and epidemiological information. The expected result is Negative.  Fact Sheet for Patients:  BloggerCourse.com  Fact Sheet for Healthcare Providers:  SeriousBroker.it  This test is no t yet approved or cleared by the [place]  FDA and  has been authorized for detection and/or diagnosis of SARS-CoV-2 by FDA under an Emergency Use Authorization (EUA). This EUA will remain  in effect (meaning this test can be used) for the duration of the COVID-19 declaration under Section 564(b)(1) of the Act, 21 U.S.C.section 360bbb-3(b)(1), unless the authorization is terminated  or revoked sooner.       Influenza A by PCR NEGATIVE NEGATIVE Final   Influenza B by PCR NEGATIVE NEGATIVE Final    Comment: (NOTE) The Xpert Xpress SARS-CoV-2/FLU/RSV plus assay is intended as an aid in the diagnosis of influenza from Nasopharyngeal swab specimens and should not be used as a sole basis for treatment. Nasal washings and aspirates are unacceptable for Xpert Xpress SARS-CoV-2/FLU/RSV testing.  Fact Sheet for Patients: BloggerCourse.com  Fact Sheet for Healthcare Providers: SeriousBroker.it  This test is not yet approved or cleared by the [place]  FDA and has been authorized for detection and/or diagnosis of SARS-CoV-2 by FDA under an Emergency Use Authorization (  EUA). This EUA will remain in effect (meaning this test can be used) for  the duration of the COVID-19 declaration under Section 564(b)(1) of the Act, 21 U.S.C. section 360bbb-3(b)(1), unless the authorization is terminated or revoked.     Resp Syncytial Virus by PCR NEGATIVE NEGATIVE Final    Comment: (NOTE) Fact Sheet for Patients: BloggerCourse.com  Fact Sheet for Healthcare Providers: SeriousBroker.it  This test is not yet approved or cleared by the [place]  FDA and has been authorized for detection and/or diagnosis of SARS-CoV-2 by FDA under an Emergency Use Authorization (EUA). This EUA will remain in effect (meaning this test can be used) for the duration of the COVID-19 declaration under Section 564(b)(1) of the Act, 21 U.S.C. section 360bbb-3(b)(1), unless the authorization is terminated or revoked.  Performed at [facility], [address] [place], [place] [address]   Culture, blood (routine x 2)     Status: None   Collection Time: 06/30/24  6:55 PM   Specimen: BLOOD LEFT HAND  Result Value Ref Range Status   Specimen Description BLOOD LEFT HAND  Final   Special Requests   Final    BOTTLES DRAWN AEROBIC AND ANAEROBIC Blood Culture adequate volume   Culture   Final    NO GROWTH 5 DAYS Performed at [facility], [address] [place], [place] [address]    Report Status 07/05/2024 FINAL  Final  Respiratory (~20 pathogens) panel by PCR     Status: None   Collection Time: 06/30/24  6:55 PM   Specimen: Nasopharyngeal Swab; Respiratory  Result Value Ref Range Status   Adenovirus NOT DETECTED NOT DETECTED Final   Coronavirus 229E NOT DETECTED NOT DETECTED Final    Comment: (NOTE) The Coronavirus on the Respiratory Panel, DOES NOT test for the novel  Coronavirus (2019 nCoV)    Coronavirus HKU1 NOT DETECTED NOT DETECTED Final   Coronavirus NL63 NOT DETECTED NOT DETECTED Final   Coronavirus OC43 NOT DETECTED NOT DETECTED Final   Metapneumovirus NOT DETECTED NOT  DETECTED Final   Rhinovirus / Enterovirus NOT DETECTED NOT DETECTED Final   Influenza A NOT DETECTED NOT DETECTED Final   Influenza B NOT DETECTED NOT DETECTED Final   Parainfluenza Virus 1 NOT DETECTED NOT DETECTED Final   Parainfluenza Virus 2 NOT DETECTED NOT DETECTED Final   Parainfluenza Virus 3 NOT DETECTED NOT DETECTED Final   Parainfluenza Virus 4 NOT DETECTED NOT DETECTED Final   Respiratory Syncytial Virus NOT DETECTED NOT DETECTED Final   Bordetella pertussis NOT DETECTED NOT DETECTED Final   Bordetella Parapertussis NOT DETECTED NOT DETECTED Final   Chlamydophila pneumoniae NOT DETECTED NOT DETECTED Final   Mycoplasma pneumoniae NOT DETECTED NOT DETECTED Final    Comment: Performed at [facility], [address] [place], [place] [address]  MRSA Next Gen by PCR, Nasal     Status: None   Collection Time: 07/01/24  3:48 AM   Specimen: Nasal Mucosa; Nasal Swab  Result Value Ref Range Status   MRSA by PCR Next Gen NOT DETECTED NOT DETECTED Final    Comment: (NOTE) The GeneXpert MRSA Assay (FDA approved for NASAL specimens only), is one component of a comprehensive MRSA colonization surveillance program. It is not intended to diagnose MRSA infection nor to guide or monitor treatment for MRSA infections. Test performance is not FDA approved in patients less than 40 years old. Performed at [facility], [address] [place], [place] [address]  Labs: BNP (last 3 results) Recent Labs    06/10/24 2252 06/30/24 1849  BNP 195.9* 796.9*   Basic Metabolic Panel: Recent Labs  Lab 07/04/24 0434 07/06/24 1046  NA 144 139  K 4.2 3.8  CL 103 102  CO2 28 30  GLUCOSE 160* 199*  BUN 63* 48*  CREATININE 1.41* 1.12*  CALCIUM  9.0 8.7*   Liver Function Tests: Recent Labs  Lab 07/06/24 1046  AST 38  ALT 26  ALKPHOS 132*  BILITOT 0.6  PROT 4.8*  ALBUMIN  2.4*   No results for input(s): LIPASE, AMYLASE in the last 168 hours. No results for  input(s): AMMONIA in the last 168 hours. CBC: Recent Labs  Lab 07/05/24 0447 07/06/24 1046 07/08/24 0455  WBC 9.9 11.9* 14.1*  NEUTROABS  --  9.8*  --   HGB 8.6* 8.9* 9.8*  HCT 27.9* 29.2* 31.6*  MCV 94.6 96.4 94.3  PLT 158 141* 133*   Cardiac Enzymes: No results for input(s): CKTOTAL, CKMB, CKMBINDEX, TROPONINI in the last 168 hours. BNP: Invalid input(s): POCBNP CBG: Recent Labs  Lab 07/03/24 2053  GLUCAP 219*   D-Dimer No results for input(s): DDIMER in the last 72 hours. Hgb A1c No results for input(s): HGBA1C in the last 72 hours. Lipid Profile No results for input(s): CHOL, HDL, LDLCALC, TRIG, CHOLHDL, LDLDIRECT in the last 72 hours. Thyroid function studies No results for input(s): TSH, T4TOTAL, T3FREE, THYROIDAB in the last 72 hours.  Invalid input(s): FREET3 Anemia work up No results for input(s): VITAMINB12, FOLATE, FERRITIN, TIBC, IRON, RETICCTPCT in the last 72 hours. Urinalysis    Component Value Date/Time   COLORURINE YELLOW (A) 05/12/2024 1306   APPEARANCEUR CLOUDY (A) 05/12/2024 1306   APPEARANCEUR Hazy (A) 08/18/2021 1452   LABSPEC 1.015 05/12/2024 1306   LABSPEC 1.021 09/08/2013 0127   PHURINE 5.0 05/12/2024 1306   GLUCOSEU NEGATIVE 05/12/2024 1306   GLUCOSEU Negative 09/08/2013 0127   HGBUR NEGATIVE 05/12/2024 1306   BILIRUBINUR NEGATIVE 05/12/2024 1306   BILIRUBINUR Negative 08/18/2021 1452   BILIRUBINUR Negative 09/08/2013 0127   KETONESUR NEGATIVE 05/12/2024 1306   PROTEINUR NEGATIVE 05/12/2024 1306   NITRITE POSITIVE (A) 05/12/2024 1306   LEUKOCYTESUR LARGE (A) 05/12/2024 1306   LEUKOCYTESUR Negative 09/08/2013 0127   Sepsis Labs Recent Labs  Lab 07/05/24 0447 07/06/24 1046 07/08/24 0455  WBC 9.9 11.9* 14.1*   Microbiology Recent Results (from the past 240 hours)  Culture, blood (routine x 2)     Status: None   Collection Time: 06/30/24  6:50 PM   Specimen: BLOOD  Result  Value Ref Range Status   Specimen Description BLOOD RIGHT ANTECUBITAL  Final   Special Requests   Final    BOTTLES DRAWN AEROBIC AND ANAEROBIC Blood Culture adequate volume   Culture   Final    NO GROWTH 5 DAYS Performed at [facility], [address] [place], [place] [address]    Report Status 07/05/2024 FINAL  Final  Resp panel by RT-PCR (RSV, Flu A&B, Covid) Anterior Nasal Swab     Status: None   Collection Time: 06/30/24  6:55 PM   Specimen: Anterior Nasal Swab  Result Value Ref Range Status   SARS Coronavirus 2 by RT PCR NEGATIVE NEGATIVE Final    Comment: (NOTE) SARS-CoV-2 target nucleic acids are NOT DETECTED.  The SARS-CoV-2 RNA is generally detectable in upper respiratory specimens during the acute phase of infection. The lowest concentration of SARS-CoV-2 viral copies this assay can detect is 138 copies/mL.  A negative result does not preclude SARS-Cov-2 infection and should not be used as the sole basis for treatment or other patient management decisions. A negative result may occur with  improper specimen collection/handling, submission of specimen other than nasopharyngeal swab, presence of viral mutation(s) within the areas targeted by this assay, and inadequate number of viral copies(<138 copies/mL). A negative result must be combined with clinical observations, patient history, and epidemiological information. The expected result is Negative.  Fact Sheet for Patients:  BloggerCourse.com  Fact Sheet for Healthcare Providers:  SeriousBroker.it  This test is no t yet approved or cleared by the [place]  FDA and  has been authorized for detection and/or diagnosis of SARS-CoV-2 by FDA under an Emergency Use Authorization (EUA). This EUA will remain  in effect (meaning this test can be used) for the duration of the COVID-19 declaration under Section 564(b)(1) of the Act, 21 U.S.C.section 360bbb-3(b)(1),  unless the authorization is terminated  or revoked sooner.       Influenza A by PCR NEGATIVE NEGATIVE Final   Influenza B by PCR NEGATIVE NEGATIVE Final    Comment: (NOTE) The Xpert Xpress SARS-CoV-2/FLU/RSV plus assay is intended as an aid in the diagnosis of influenza from Nasopharyngeal swab specimens and should not be used as a sole basis for treatment. Nasal washings and aspirates are unacceptable for Xpert Xpress SARS-CoV-2/FLU/RSV testing.  Fact Sheet for Patients: BloggerCourse.com  Fact Sheet for Healthcare Providers: SeriousBroker.it  This test is not yet approved or cleared by the [place]  FDA and has been authorized for detection and/or diagnosis of SARS-CoV-2 by FDA under an Emergency Use Authorization (EUA). This EUA will remain in effect (meaning this test can be used) for the duration of the COVID-19 declaration under Section 564(b)(1) of the Act, 21 U.S.C. section 360bbb-3(b)(1), unless the authorization is terminated or revoked.     Resp Syncytial Virus by PCR NEGATIVE NEGATIVE Final    Comment: (NOTE) Fact Sheet for Patients: BloggerCourse.com  Fact Sheet for Healthcare Providers: SeriousBroker.it  This test is not yet approved or cleared by the [place]  FDA and has been authorized for detection and/or diagnosis of SARS-CoV-2 by FDA under an Emergency Use Authorization (EUA). This EUA will remain in effect (meaning this test can be used) for the duration of the COVID-19 declaration under Section 564(b)(1) of the Act, 21 U.S.C. section 360bbb-3(b)(1), unless the authorization is terminated or revoked.  Performed at [facility], [address] [place], [place] [address]   Culture, blood (routine x 2)     Status: None   Collection Time: 06/30/24  6:55 PM   Specimen: BLOOD LEFT HAND  Result Value Ref Range Status   Specimen  Description BLOOD LEFT HAND  Final   Special Requests   Final    BOTTLES DRAWN AEROBIC AND ANAEROBIC Blood Culture adequate volume   Culture   Final    NO GROWTH 5 DAYS Performed at [facility], [address] [place], [place] [address]    Report Status 07/05/2024 FINAL  Final  Respiratory (~20 pathogens) panel by PCR     Status: None   Collection Time: 06/30/24  6:55 PM   Specimen: Nasopharyngeal Swab; Respiratory  Result Value Ref Range Status   Adenovirus NOT DETECTED NOT DETECTED Final   Coronavirus 229E NOT DETECTED NOT DETECTED Final    Comment: (NOTE) The Coronavirus on the Respiratory Panel, DOES NOT test for the novel  Coronavirus (2019 nCoV)    Coronavirus HKU1 NOT DETECTED  NOT DETECTED Final   Coronavirus NL63 NOT DETECTED NOT DETECTED Final   Coronavirus OC43 NOT DETECTED NOT DETECTED Final   Metapneumovirus NOT DETECTED NOT DETECTED Final   Rhinovirus / Enterovirus NOT DETECTED NOT DETECTED Final   Influenza A NOT DETECTED NOT DETECTED Final   Influenza B NOT DETECTED NOT DETECTED Final   Parainfluenza Virus 1 NOT DETECTED NOT DETECTED Final   Parainfluenza Virus 2 NOT DETECTED NOT DETECTED Final   Parainfluenza Virus 3 NOT DETECTED NOT DETECTED Final   Parainfluenza Virus 4 NOT DETECTED NOT DETECTED Final   Respiratory Syncytial Virus NOT DETECTED NOT DETECTED Final   Bordetella pertussis NOT DETECTED NOT DETECTED Final   Bordetella Parapertussis NOT DETECTED NOT DETECTED Final   Chlamydophila pneumoniae NOT DETECTED NOT DETECTED Final   Mycoplasma pneumoniae NOT DETECTED NOT DETECTED Final    Comment: Performed at [facility], [address], [place], [place] [address]  MRSA Next Gen by PCR, Nasal     Status: None   Collection Time: 07/01/24  3:48 AM   Specimen: Nasal Mucosa; Nasal Swab  Result Value Ref Range Status   MRSA by PCR Next Gen NOT DETECTED NOT DETECTED Final    Comment: (NOTE) The GeneXpert MRSA Assay (FDA approved for NASAL  specimens only), is one component of a comprehensive MRSA colonization surveillance program. It is not intended to diagnose MRSA infection nor to guide or monitor treatment for MRSA infections. Test performance is not FDA approved in patients less than 26 years old. Performed at [facility], [address] [place], [place] [address]      SIGNED: Lorane Poland, DO [facility] 07/10/2024, 3:42 PM Pager   If 7PM-7AM, please contact night-coverage

## 2024-07-29 NOTE — Progress Notes (Signed)
 Nutrition Brief Note  Chart reviewed. Pt now transitioning to comfort care.  No further nutrition interventions planned at this time.  Please re-consult as needed.   Margery ORN, RD, LDN, CDCES Registered Dietitian III Certified Diabetes Care and Education Specialist If unable to reach this RD, please use RD Inpatient group chat on secure chat between hours of 8am-4 pm daily

## 2024-07-29 DEATH — deceased

## 2024-12-17 ENCOUNTER — Ambulatory Visit: Payer: Self-pay | Admitting: Urology

## 2025-02-18 ENCOUNTER — Encounter (INDEPENDENT_AMBULATORY_CARE_PROVIDER_SITE_OTHER)

## 2025-02-18 ENCOUNTER — Ambulatory Visit (INDEPENDENT_AMBULATORY_CARE_PROVIDER_SITE_OTHER): Admitting: Vascular Surgery
